# Patient Record
Sex: Female | Born: 1945 | Race: White | Hispanic: No | Marital: Married | Smoking: Never smoker
Health system: Southern US, Community
[De-identification: ages and names within clinical notes are randomized; demographics above are authoritative.]

## PROBLEM LIST (undated history)

## (undated) DIAGNOSIS — M810 Age-related osteoporosis without current pathological fracture: Secondary | ICD-10-CM

## (undated) DIAGNOSIS — K219 Gastro-esophageal reflux disease without esophagitis: Secondary | ICD-10-CM

## (undated) DIAGNOSIS — Z933 Colostomy status: Secondary | ICD-10-CM

## (undated) DIAGNOSIS — K635 Polyp of colon: Secondary | ICD-10-CM

## (undated) DIAGNOSIS — K5790 Diverticulosis of intestine, part unspecified, without perforation or abscess without bleeding: Secondary | ICD-10-CM

## (undated) DIAGNOSIS — J4520 Mild intermittent asthma, uncomplicated: Secondary | ICD-10-CM

## (undated) DIAGNOSIS — Z8489 Family history of other specified conditions: Secondary | ICD-10-CM

## (undated) DIAGNOSIS — T8859XA Other complications of anesthesia, initial encounter: Secondary | ICD-10-CM

## (undated) DIAGNOSIS — C8581 Other specified types of non-Hodgkin lymphoma, lymph nodes of head, face, and neck: Secondary | ICD-10-CM

## (undated) DIAGNOSIS — E871 Hypo-osmolality and hyponatremia: Secondary | ICD-10-CM

## (undated) DIAGNOSIS — R000 Tachycardia, unspecified: Secondary | ICD-10-CM

## (undated) DIAGNOSIS — M1990 Unspecified osteoarthritis, unspecified site: Secondary | ICD-10-CM

## (undated) DIAGNOSIS — C439 Malignant melanoma of skin, unspecified: Secondary | ICD-10-CM

## (undated) DIAGNOSIS — D649 Anemia, unspecified: Secondary | ICD-10-CM

## (undated) HISTORY — PX: TONSILLECTOMY: SUR1361

## (undated) HISTORY — DX: Diverticulosis of intestine, part unspecified, without perforation or abscess without bleeding: K57.90

## (undated) HISTORY — PX: CATARACT EXTRACTION: SUR2

## (undated) HISTORY — DX: Polyp of colon: K63.5

## (undated) HISTORY — DX: Age-related osteoporosis without current pathological fracture: M81.0

## (undated) HISTORY — DX: Mild intermittent asthma, uncomplicated: J45.20

## (undated) HISTORY — PX: BACK SURGERY: SHX140

---

## 1986-06-02 DIAGNOSIS — C439 Malignant melanoma of skin, unspecified: Secondary | ICD-10-CM

## 1986-06-02 HISTORY — DX: Malignant melanoma of skin, unspecified: C43.9

## 1988-06-02 HISTORY — PX: VAGINAL HYSTERECTOMY: SUR661

## 2015-03-19 ENCOUNTER — Other Ambulatory Visit: Payer: Self-pay | Admitting: Internal Medicine

## 2015-03-19 DIAGNOSIS — Z1231 Encounter for screening mammogram for malignant neoplasm of breast: Secondary | ICD-10-CM

## 2015-03-19 DIAGNOSIS — M810 Age-related osteoporosis without current pathological fracture: Secondary | ICD-10-CM | POA: Insufficient documentation

## 2015-03-19 DIAGNOSIS — J4520 Mild intermittent asthma, uncomplicated: Secondary | ICD-10-CM | POA: Insufficient documentation

## 2015-03-28 ENCOUNTER — Ambulatory Visit: Payer: Self-pay

## 2015-04-16 ENCOUNTER — Other Ambulatory Visit: Payer: Self-pay | Admitting: Internal Medicine

## 2015-04-16 ENCOUNTER — Ambulatory Visit
Admission: RE | Admit: 2015-04-16 | Discharge: 2015-04-16 | Disposition: A | Discharge status: Home / Self Care | Payer: Medicare Other | Source: Ambulatory Visit | Attending: Internal Medicine | Admitting: Internal Medicine

## 2015-04-16 DIAGNOSIS — Z1231 Encounter for screening mammogram for malignant neoplasm of breast: Secondary | ICD-10-CM | POA: Diagnosis present

## 2015-04-16 DIAGNOSIS — C439 Malignant melanoma of skin, unspecified: Secondary | ICD-10-CM | POA: Insufficient documentation

## 2015-04-16 HISTORY — DX: Malignant melanoma of skin, unspecified: C43.9

## 2016-12-05 ENCOUNTER — Other Ambulatory Visit: Payer: Self-pay | Admitting: Internal Medicine

## 2016-12-05 DIAGNOSIS — Z1231 Encounter for screening mammogram for malignant neoplasm of breast: Secondary | ICD-10-CM

## 2016-12-17 ENCOUNTER — Ambulatory Visit
Admission: RE | Admit: 2016-12-17 | Discharge: 2016-12-17 | Disposition: A | Discharge status: Home / Self Care | Payer: Medicare Other | Source: Ambulatory Visit | Attending: Internal Medicine | Admitting: Internal Medicine

## 2016-12-17 DIAGNOSIS — Z1231 Encounter for screening mammogram for malignant neoplasm of breast: Secondary | ICD-10-CM | POA: Diagnosis present

## 2017-12-08 ENCOUNTER — Other Ambulatory Visit: Payer: Self-pay | Admitting: Internal Medicine

## 2017-12-08 DIAGNOSIS — Z1231 Encounter for screening mammogram for malignant neoplasm of breast: Secondary | ICD-10-CM

## 2018-01-01 ENCOUNTER — Ambulatory Visit
Admission: RE | Admit: 2018-01-01 | Discharge: 2018-01-01 | Disposition: A | Discharge status: Home / Self Care | Payer: Medicare Other | Source: Ambulatory Visit | Attending: Internal Medicine | Admitting: Internal Medicine

## 2018-01-01 DIAGNOSIS — Z1231 Encounter for screening mammogram for malignant neoplasm of breast: Secondary | ICD-10-CM | POA: Insufficient documentation

## 2018-12-20 ENCOUNTER — Other Ambulatory Visit: Payer: Self-pay | Admitting: Internal Medicine

## 2018-12-20 DIAGNOSIS — Z1231 Encounter for screening mammogram for malignant neoplasm of breast: Secondary | ICD-10-CM

## 2019-01-21 ENCOUNTER — Other Ambulatory Visit: Payer: Self-pay

## 2019-01-21 ENCOUNTER — Ambulatory Visit
Admission: RE | Admit: 2019-01-21 | Discharge: 2019-01-21 | Disposition: A | Discharge status: Home / Self Care | Payer: Medicare Other | Source: Ambulatory Visit | Attending: Internal Medicine | Admitting: Internal Medicine

## 2019-01-21 DIAGNOSIS — Z1231 Encounter for screening mammogram for malignant neoplasm of breast: Secondary | ICD-10-CM | POA: Diagnosis present

## 2019-12-21 ENCOUNTER — Other Ambulatory Visit: Payer: Self-pay | Admitting: Internal Medicine

## 2019-12-21 DIAGNOSIS — Z1231 Encounter for screening mammogram for malignant neoplasm of breast: Secondary | ICD-10-CM

## 2020-02-07 ENCOUNTER — Ambulatory Visit
Admission: RE | Admit: 2020-02-07 | Discharge: 2020-02-07 | Disposition: A | Discharge status: Home / Self Care | Payer: Medicare Other | Source: Ambulatory Visit | Attending: Internal Medicine | Admitting: Internal Medicine

## 2020-02-07 DIAGNOSIS — Z1231 Encounter for screening mammogram for malignant neoplasm of breast: Secondary | ICD-10-CM

## 2020-06-02 DIAGNOSIS — J189 Pneumonia, unspecified organism: Secondary | ICD-10-CM

## 2020-06-02 HISTORY — DX: Pneumonia, unspecified organism: J18.9

## 2020-06-10 ENCOUNTER — Other Ambulatory Visit: Payer: Self-pay

## 2020-06-10 DIAGNOSIS — Z20822 Contact with and (suspected) exposure to covid-19: Secondary | ICD-10-CM

## 2020-06-14 LAB — NOVEL CORONAVIRUS, NAA: SARS-CoV-2, NAA: DETECTED — AB

## 2020-08-25 ENCOUNTER — Encounter: Payer: Self-pay | Admitting: Emergency Medicine

## 2020-08-25 ENCOUNTER — Other Ambulatory Visit: Payer: Self-pay

## 2020-08-25 ENCOUNTER — Emergency Department: Payer: Medicare Other

## 2020-08-25 ENCOUNTER — Emergency Department
Admission: EM | Admit: 2020-08-25 | Discharge: 2020-08-25 | Disposition: A | Discharge status: Home / Self Care | Payer: Medicare Other | Attending: Emergency Medicine | Admitting: Emergency Medicine

## 2020-08-25 DIAGNOSIS — M5432 Sciatica, left side: Secondary | ICD-10-CM | POA: Diagnosis not present

## 2020-08-25 DIAGNOSIS — M549 Dorsalgia, unspecified: Secondary | ICD-10-CM | POA: Diagnosis present

## 2020-08-25 DIAGNOSIS — Z85820 Personal history of malignant melanoma of skin: Secondary | ICD-10-CM | POA: Diagnosis not present

## 2020-08-25 MED ORDER — METHYLPREDNISOLONE 4 MG PO TBPK: ORAL_TABLET | ORAL | 0 refills | Status: DC

## 2020-08-25 MED ORDER — DEXAMETHASONE SODIUM PHOSPHATE 10 MG/ML IJ SOLN
10.0000 mg | Freq: Once | INTRAMUSCULAR | Status: AC
  Administered 2020-08-25: 10 mg via INTRAMUSCULAR
  Filled 2020-08-25: qty 1

## 2020-08-25 MED ORDER — TRAMADOL HCL 50 MG PO TABS: 50.0000 mg | ORAL_TABLET | Freq: Two times a day (BID) | ORAL | 0 refills | Status: DC | PRN

## 2020-08-25 MED ORDER — HYDROMORPHONE HCL 1 MG/ML IJ SOLN
0.5000 mg | Freq: Once | INTRAMUSCULAR | Status: AC
  Administered 2020-08-25: 0.5 mg via INTRAMUSCULAR
  Filled 2020-08-25: qty 1

## 2020-08-25 NOTE — ED Triage Notes (Signed)
C/O left lower back pain radiating down left leg x 1 week.  Seen by PCP and diagnosed with pinched nerve.  Arrives today with persistent pain.  AAOx3.  Skin warm and dry. Ambulates with easy and steady gait. NAD

## 2020-08-25 NOTE — ED Notes (Signed)
Pt complains of left lower back pain that radiates to anterior thigh that's been going on for 5 days saw her PCP ordered meloxicam and gabapentin pt states no relief in pain

## 2020-08-25 NOTE — ED Provider Notes (Signed)
[facility] Emergency Department Provider Note   ____________________________________________   Event Date/Time   First MD Initiated Contact with Patient 08/25/20 [phone]     (approximate)  I have reviewed the triage vital signs and the nursing notes.   HISTORY  Chief Complaint Back Pain    HPI Mary Washington is a 75 y.o. female patient complain of radicular back pain to the left lower extremity for 1 week.  Patient states saw PCP was diagnosed with pinched nerve.  Patient she was given prescription for meloxicam and gabapentin.  Patient no improvement with pain.  Patient was told that if medication did not work she will follow-up with orthopedics.  Patient elected to come to emergency room.  Patient is to contact her PCP to tell her the medication is not working.         Past Medical History:  Diagnosis Date  . Melanoma ([place]) 1988   lt upper arm, some lymph nodes removed    Patient Active Problem List   Diagnosis Date Noted  . Melanoma [facility])     Past Surgical History:  Procedure Laterality Date  . BACK SURGERY    . CATARACT EXTRACTION    . TONSILLECTOMY    . VAGINAL HYSTERECTOMY  1990    Prior to Admission medications   Medication Sig Start Date End Date Taking? Authorizing Provider  methylPREDNISolone (MEDROL DOSEPAK) 4 MG TBPK tablet Take Tapered dose as directed 08/25/20   Sable Feil, PA-C  traMADol (ULTRAM) 50 MG tablet Take 1 tablet (50 mg total) by mouth every 12 (twelve) hours as needed for moderate pain. 08/25/20   Sable Feil, PA-C    Allergies Patient has no known allergies.  Family History  Problem Relation Age of Onset  . Breast cancer Neg Hx     Social History Social History   Tobacco Use  . Smoking status: Never Smoker  . Smokeless tobacco: Never Used    Review of Systems Constitutional: No fever/chills Eyes: No visual changes. ENT: No sore throat. Cardiovascular: Denies chest pain. Respiratory: Denies  shortness of breath. Gastrointestinal: No abdominal pain.  No nausea, no vomiting.  No diarrhea.  No constipation. Genitourinary: Negative for dysuria. Musculoskeletal: Radicular back pain. Skin: Negative for rash. Neurological: Negative for headaches, focal weakness or numbness.   ____________________________________________   PHYSICAL EXAM:  VITAL SIGNS: ED Triage Vitals  Enc Vitals Group     BP 08/25/20 0900 (!) 167/81     Pulse Rate 08/25/20 0900 (!) 113     Resp 08/25/20 0900 18     Temp 08/25/20 0900 98.4 F (36.9 C)     Temp Source 08/25/20 0900 Oral     SpO2 08/25/20 0900 99 %     Weight 08/25/20 0858 138 lb (62.6 kg)     Height 08/25/20 0858 5\' 2"  (1.575 m)     Head Circumference --      Peak Flow --      Pain Score 08/25/20 0858 5     Pain Loc --      Pain Edu? --      Excl. in [place]? --    Constitutional: Alert and oriented. Well appearing and in no acute distress. Hematological/Lymphatic/Immunilogical: No cervical lymphadenopathy. Cardiovascular: Normal rate, regular rhythm. Grossly normal heart sounds.  Good peripheral circulation. Respiratory: Normal respiratory effort.  No retractions. Lungs CTAB. Gastrointestinal: Soft and nontender. No distention. No abdominal bruits. No CVA tenderness. Musculoskeletal: No lower spinal deformity.  Surgical scar  consistent with past history.  Moderate guarding palpation L4-S1.  No lower extremity tenderness nor edema.  No joint effusions.  Negative straight leg test bilaterally. Neurologic:  Normal speech and language. No gross focal neurologic deficits are appreciated. No gait instability. Skin:  Skin is warm, dry and intact. No rash noted. Psychiatric: Mood and affect are normal. Speech and behavior are normal.  ____________________________________________   LABS (all labs ordered are listed, but only abnormal results are displayed)  Labs Reviewed - No data to  display ____________________________________________  EKG   ____________________________________________  RADIOLOGY I, Sable Feil, personally viewed and evaluated these images (plain radiographs) as part of my medical decision making, as well as reviewing the written report by the radiologist.  ED MD interpretation: Mild degenerative changes  Official radiology report(s): DG Lumbar Spine 2-3 Views  Result Date: 08/25/2020 CLINICAL DATA:  Low back pain EXAM: LUMBAR SPINE - 2-3 VIEW COMPARISON:  None. FINDINGS: Right vestigial rib at presumed T12 vertebral body, with vertebral augmentation at this level. Five lumbar-type vertebral bodies.  Normal lumbar lordosis. No evidence of fracture or dislocation. Vertebral body heights are maintained. Mild degenerative changes at L4-5. Visualized bony pelvis appears intact. IMPRESSION: No fracture or dislocation is seen. Mild degenerative changes at L4-5. Electronically Signed   By: Julian Hy M.D.   On: 08/25/2020 10:26    ____________________________________________   PROCEDURES  Procedure(s) performed (including Critical Care):  Procedures   ____________________________________________   INITIAL IMPRESSION / ASSESSMENT AND PLAN / ED COURSE  As part of my medical decision making, I reviewed the following data within the [facility]         Patient presents with 1 week of radicular back pain to left lower extremity.  Differentials consist of pinched nerve versus sciatica.  Discussed no acute findings on x-ray.  Patient complaint physical exam consistent with with radicular pain.  Patient advised to discontinue meloxicam.  Patient given Decadron and tramadol prior to departure.  Patient given prescription for Medrol Dosepak and advised to take tramadol every 12 hours as needed.  Advised to follow-up orthopedic listed discharge care instructions.  Return back condition worsens before evaluation by orthopedics.      ____________________________________________   FINAL CLINICAL IMPRESSION(S) / ED DIAGNOSES  Final diagnoses:  Sciatica of left side     ED Discharge Orders         Ordered    methylPREDNISolone (MEDROL DOSEPAK) 4 MG TBPK tablet  Status:  Discontinued        08/25/20 1049    traMADol (ULTRAM) 50 MG tablet  Every 12 hours PRN,   Status:  Discontinued        08/25/20 1049    methylPREDNISolone (MEDROL DOSEPAK) 4 MG TBPK tablet        08/25/20 1053    traMADol (ULTRAM) 50 MG tablet  Every 12 hours PRN        08/25/20 1053          *Please note:  Mary Washington was evaluated in Emergency Department on 08/25/2020 for the symptoms described in the history of present illness. She was evaluated in the context of the global COVID-19 pandemic, which necessitated consideration that the patient might be at risk for infection with the SARS-CoV-2 virus that causes COVID-19. Institutional protocols and algorithms that pertain to the evaluation of patients at risk for COVID-19 are in a state of rapid change based on information released by regulatory bodies including the CDC and federal and  state organizations. These policies and algorithms were followed during the patient's care in the ED.  Some ED evaluations and interventions may be delayed as a result of limited staffing during and the pandemic.*   Note:  This document was prepared using Dragon voice recognition software and may include unintentional dictation errors.    Sable Feil, PA-C 08/25/20 1100    Vladimir Crofts, MD 08/25/20 1415

## 2020-08-25 NOTE — Discharge Instructions (Addendum)
Read and follow discharge care instructions.  Discontinue meloxicam but continue gabapentin.  Start Medrol Dosepak and tramadol as directed.

## 2020-09-27 ENCOUNTER — Encounter: Payer: Self-pay | Admitting: Internal Medicine

## 2020-09-27 ENCOUNTER — Inpatient Hospital Stay: Payer: Medicare Other

## 2020-09-27 ENCOUNTER — Inpatient Hospital Stay: Payer: Medicare Other | Attending: Internal Medicine | Admitting: Internal Medicine

## 2020-09-27 ENCOUNTER — Other Ambulatory Visit: Payer: Self-pay

## 2020-09-27 VITALS — BP 151/89 | HR 120 | Temp 97.9°F | Ht 62.0 in | Wt 129.0 lb

## 2020-09-27 DIAGNOSIS — R590 Localized enlarged lymph nodes: Secondary | ICD-10-CM | POA: Insufficient documentation

## 2020-09-27 DIAGNOSIS — G8929 Other chronic pain: Secondary | ICD-10-CM | POA: Diagnosis not present

## 2020-09-27 DIAGNOSIS — M25559 Pain in unspecified hip: Secondary | ICD-10-CM | POA: Diagnosis not present

## 2020-09-27 DIAGNOSIS — Z85820 Personal history of malignant melanoma of skin: Secondary | ICD-10-CM | POA: Insufficient documentation

## 2020-09-27 DIAGNOSIS — K5909 Other constipation: Secondary | ICD-10-CM | POA: Insufficient documentation

## 2020-09-27 DIAGNOSIS — Z79899 Other long term (current) drug therapy: Secondary | ICD-10-CM | POA: Insufficient documentation

## 2020-09-27 DIAGNOSIS — M545 Low back pain, unspecified: Secondary | ICD-10-CM | POA: Diagnosis not present

## 2020-09-27 LAB — CBC WITH DIFFERENTIAL/PLATELET
Abs Immature Granulocytes: 0.03 10*3/uL (ref 0.00–0.07)
Basophils Absolute: 0 10*3/uL (ref 0.0–0.1)
Basophils Relative: 0 %
Eosinophils Absolute: 0.1 10*3/uL (ref 0.0–0.5)
Eosinophils Relative: 2 %
HCT: 43.9 % (ref 36.0–46.0)
Hemoglobin: 14.3 g/dL (ref 12.0–15.0)
Immature Granulocytes: 0 %
Lymphocytes Relative: 13 %
Lymphs Abs: 1.2 10*3/uL (ref 0.7–4.0)
MCH: 26.6 pg (ref 26.0–34.0)
MCHC: 32.6 g/dL (ref 30.0–36.0)
MCV: 81.8 fL (ref 80.0–100.0)
Monocytes Absolute: 1 10*3/uL (ref 0.1–1.0)
Monocytes Relative: 11 %
Neutro Abs: 6.7 10*3/uL (ref 1.7–7.7)
Neutrophils Relative %: 74 %
Platelets: 539 10*3/uL — ABNORMAL HIGH (ref 150–400)
RBC: 5.37 MIL/uL — ABNORMAL HIGH (ref 3.87–5.11)
RDW: 14.8 % (ref 11.5–15.5)
WBC: 9 10*3/uL (ref 4.0–10.5)
nRBC: 0 % (ref 0.0–0.2)

## 2020-09-27 LAB — COMPREHENSIVE METABOLIC PANEL
ALT: 17 U/L (ref 0–44)
AST: 21 U/L (ref 15–41)
Albumin: 3.8 g/dL (ref 3.5–5.0)
Alkaline Phosphatase: 98 U/L (ref 38–126)
Anion gap: 13 (ref 5–15)
BUN: 14 mg/dL (ref 8–23)
CO2: 28 mmol/L (ref 22–32)
Calcium: 9.9 mg/dL (ref 8.9–10.3)
Chloride: 90 mmol/L — ABNORMAL LOW (ref 98–111)
Creatinine, Ser: 0.84 mg/dL (ref 0.44–1.00)
GFR, Estimated: >60 mL/min (ref >60)
Glucose, Bld: 112 mg/dL — ABNORMAL HIGH (ref 70–99)
Potassium: 3.8 mmol/L (ref 3.5–5.1)
Sodium: 131 mmol/L — ABNORMAL LOW (ref 135–145)
Total Bilirubin: 0.6 mg/dL (ref 0.3–1.2)
Total Protein: 7.3 g/dL (ref 6.5–8.1)

## 2020-09-27 LAB — LACTATE DEHYDROGENASE: LDH: 169 U/L (ref 98–192)

## 2020-09-27 NOTE — Progress Notes (Signed)
[facility] NOTE  Patient Care Team: [name], MD as PCP - General (Internal Medicine)  CHIEF COMPLAINTS/PURPOSE OF CONSULTATION: abdominal LN  # coloscopy [2022; [facility]]; mammogram- nov 2021-WNL.   # Melanoma [left Upper arm] s/p excision [skin graft- 1988] Oncology History   No history exists.     HISTORY OF PRESENTING ILLNESS:  Mary Washington 75 y.o.  female has been referred to [place] for further evaluation recommendations for incidentally found abdominal adenopathy on MRI.  Patient complains of left foot pain/ankle pain for which she underwent lumbosacral spine MRI-the MRI showed significant abdominal pelvic adenopathy suspicious for nodal malignancy/metastatic disease.  She has been referred to oncology for further evaluation and recommendations.  Patient has had weight loss.  Chronic constipation.  Denies any blood in stools or black or stools.  Otherwise no nausea no vomiting.   Review of Systems  Constitutional: Positive for malaise/fatigue and weight loss. Negative for chills, diaphoresis and fever.  HENT: Negative for nosebleeds and sore throat.   Eyes: Negative for double vision.  Respiratory: Negative for cough, hemoptysis, sputum production, shortness of breath and wheezing.   Cardiovascular: Negative for chest pain, palpitations, orthopnea and leg swelling.  Gastrointestinal: Positive for constipation. Negative for abdominal pain, blood in stool, diarrhea, heartburn, melena, nausea and vomiting.  Genitourinary: Negative for dysuria, frequency and urgency.  Musculoskeletal: Positive for back pain and joint pain.  Skin: Negative.  Negative for itching and rash.  Neurological: Negative for dizziness, tingling, focal weakness, weakness and headaches.  Endo/Heme/Allergies: Does not bruise/bleed easily.  Psychiatric/Behavioral: Negative for depression. The patient is not nervous/anxious and does not have insomnia.      MEDICAL HISTORY:  Past  Medical History:  Diagnosis Date  . Colon polyps   . Diverticulosis   . Melanoma ([place]) 1988   lt upper arm, some lymph nodes removed  . Mild intermittent asthma   . Osteoporosis     SURGICAL HISTORY: Past Surgical History:  Procedure Laterality Date  . BACK SURGERY    . CATARACT EXTRACTION    . TONSILLECTOMY    . VAGINAL HYSTERECTOMY  1990    SOCIAL HISTORY: Social History   Socioeconomic History  . Marital status: Married    Spouse name: Not on file  . Number of children: Not on file  . Years of education: Not on file  . Highest education level: Not on file  Occupational History  . Not on file  Tobacco Use  . Smoking status: Never Smoker  . Smokeless tobacco: Never Used  Substance and Sexual Activity  . Alcohol use: Never  . Drug use: Never  . Sexual activity: Yes    Partners: Male  Other Topics Concern  . Not on file  Social History Narrative  . Not on file   Social Determinants of Health   Financial Resource Strain: Not on file  Food Insecurity: Not on file  Transportation Needs: Not on file  Physical Activity: Not on file  Stress: Not on file  Social Connections: Not on file  Intimate Partner Violence: Not on file    FAMILY HISTORY: Family History  Problem Relation Age of Onset  . Breast cancer Neg Hx     ALLERGIES:  has No Known Allergies.  MEDICATIONS:  Current Outpatient Medications  Medication Sig Dispense Refill  . acetaminophen (TYLENOL) 500 MG tablet Take 500 mg by mouth every 6 (six) hours as needed.    Marland Kitchen aspirin 81 MG EC tablet Take 1 tablet  by mouth daily at 12 noon.    . Cholecalciferol (VITAMIN D) 50 MCG (2000 UT) CAPS Take 1 capsule by mouth daily.    . Lactobacillus (PROBIOTIC ACIDOPHILUS PO) Take 1 capsule by mouth daily at 12 noon.    . Magnesium Oxide (MAG-OXIDE PO) Take 1 capsule by mouth daily at 12 noon.    . meloxicam (MOBIC) 15 MG tablet Take 1 tablet by mouth daily.    . methocarbamol (ROBAXIN) 500 MG tablet Take 500 mg  by mouth 4 (four) times daily.    . traMADol (ULTRAM) 50 MG tablet Take 1 tablet (50 mg total) by mouth every 12 (twelve) hours as needed for moderate pain. 12 tablet 0  . loratadine (CLARITIN) 10 MG tablet Take 1 tablet by mouth as needed. (Patient not taking: Reported on 09/27/2020)     No current facility-administered medications for this visit.      Marland Kitchen  PHYSICAL EXAMINATION: ECOG PERFORMANCE STATUS: 1 - Symptomatic but completely ambulatory  Vitals:   09/27/20 1128  BP: (!) 151/89  Pulse: (!) 120  Temp: 97.9 F (36.6 C)  SpO2: 97%   Filed Weights   09/27/20 1128  Weight: 129 lb (58.5 kg)    Physical Exam Constitutional:      Comments: Accompanied by husband.  In a wheelchair because of back pain.  Right axilla adenopathy.  Approximate 2 to 3 cm in size  HENT:     Head: Normocephalic and atraumatic.     Mouth/Throat:     Pharynx: No oropharyngeal exudate.  Eyes:     Pupils: Pupils are equal, round, and reactive to light.  Cardiovascular:     Rate and Rhythm: Normal rate and regular rhythm.  Pulmonary:     Effort: Pulmonary effort is normal. No respiratory distress.     Breath sounds: Normal breath sounds. No wheezing.  Abdominal:     General: Bowel sounds are normal. There is no distension.     Palpations: Abdomen is soft. There is no mass.     Tenderness: There is no abdominal tenderness. There is no guarding or rebound.  Musculoskeletal:        General: No tenderness. Normal range of motion.     Cervical back: Normal range of motion and neck supple.  Skin:    General: Skin is warm.  Neurological:     Mental Status: She is alert and oriented to person, place, and time.  Psychiatric:        Mood and Affect: Affect normal.      LABORATORY DATA:  I have reviewed the data as listed Lab Results  Component Value Date   WBC 9.0 09/27/2020   HGB 14.3 09/27/2020   HCT 43.9 09/27/2020   MCV 81.8 09/27/2020   PLT 539 (H) 09/27/2020   Recent Labs     09/27/20 1238  NA 131*  K 3.8  CL 90*  CO2 28  GLUCOSE 112*  BUN 14  CREATININE 0.84  CALCIUM 9.9  GFRNONAA >60  PROT 7.3  ALBUMIN 3.8  AST 21  ALT 17  ALKPHOS 98  BILITOT 0.6    RADIOGRAPHIC STUDIES: I have personally reviewed the radiological images as listed and agreed with the findings in the report.   ASSESSMENT & PLAN:   Abdominal lymphadenopathy #Incidental adenopathy-bulky noted on abdomen noted on [MRI-back; [facility]].  Clinical exam-positive for right axillary adenopathy.  Recommend further work-up with CT scan chest and pelvis for further evaluation.  #Discussed with patient and husband-patient  will likely need a PET scan for further evaluation; also biopsy to confirm malignancy.  #History of melanoma-clinically does not appear to be recurrent melanoma at this time.  However await above work-up.  #Chronic low back pain/hip pain-likely arthritic/degenerative.  Unrelated to above findings on MRI.  Plan to follow-up with orthopedics.  Thank you Dr.MIller for allowing me to participate in the care of your pleasant patient. Please do not hesitate to contact me with questions or concerns in the interim.  # [id number]:[phone] [home] # Labs today- cbc/cmp;LDH # CT CAP- ASAP # follow up TBD- Dr.B  All questions were answered. The patient knows to call the clinic with any problems, questions or concerns.    [name], MD 10/03/2020 12:34 PM

## 2020-09-27 NOTE — Assessment & Plan Note (Addendum)
#  Incidental adenopathy-bulky noted on abdomen noted on [MRI-back; [facility]].  Clinical exam-positive for right axillary adenopathy.  Recommend further work-up with CT scan chest and pelvis for further evaluation.  #Discussed with patient and husband-patient will likely need a PET scan for further evaluation; also biopsy to confirm malignancy.  #History of melanoma-clinically does not appear to be recurrent melanoma at this time.  However await above work-up.  #Chronic low back pain/hip pain-likely arthritic/degenerative.  Unrelated to above findings on MRI.  Plan to follow-up with orthopedics.  Thank you Dr.MIller for allowing me to participate in the care of your pleasant patient. Please do not hesitate to contact me with questions or concerns in the interim.  # [id number]:[phone] [home] # Labs today- cbc/cmp;LDH # CT CAP- ASAP # follow up TBD- Dr.B

## 2020-09-28 LAB — CEA: CEA: 0.5 ng/mL (ref 0.0–4.7)

## 2020-10-02 ENCOUNTER — Ambulatory Visit
Admission: RE | Admit: 2020-10-02 | Discharge: 2020-10-02 | Disposition: A | Discharge status: Home / Self Care | Payer: Medicare Other | Source: Ambulatory Visit | Attending: Internal Medicine | Admitting: Internal Medicine

## 2020-10-02 DIAGNOSIS — R590 Localized enlarged lymph nodes: Secondary | ICD-10-CM | POA: Insufficient documentation

## 2020-10-02 MED ORDER — IOHEXOL 300 MG/ML  SOLN
100.0000 mL | Freq: Once | INTRAMUSCULAR | Status: AC | PRN
  Administered 2020-10-02: 100 mL via INTRAVENOUS

## 2020-10-03 ENCOUNTER — Telehealth: Payer: Self-pay | Admitting: *Deleted

## 2020-10-03 NOTE — Telephone Encounter (Signed)
Dr. Jacinto Reap- please advise on patient follow-up

## 2020-10-03 NOTE — Telephone Encounter (Signed)
Called report  IMPRESSION: 1. 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 2. Hypodense lesion in the pancreatic head/uncinate process of the pancreas measuring up to 1.3 cm with density greater than that expected for a simple cystic lesion. No pancreatic ductal dilation. While this may represent a cystic pancreatic lesions such as side-branch IPMN or pseudocyst complicated by internal protein/hemorrhage further characterization of the lesion with pancreas protocol MRI with and without contrast is suggested to exclude a malignant primary pancreatic neoplasm. 3. Mild distal esophageal wall thickening with refluxed orally ingested contrast in the distal esophagus. Correlate for esophagitis. 4. Left-sided colonic diverticulosis without findings of acute diverticulitis. 5. Aortic atherosclerosis.  Aortic Atherosclerosis (ICD10-I70.0).  These results will be called to the ordering clinician or representative by the Radiologist Assistant, and communication documented in the PACS or [employer].   Electronically Signed   By: Dahlia Bailiff MD   On: 10/03/2020 10:59

## 2020-10-04 ENCOUNTER — Telehealth: Payer: Self-pay | Admitting: *Deleted

## 2020-10-04 ENCOUNTER — Telehealth: Payer: Self-pay | Admitting: Internal Medicine

## 2020-10-04 NOTE — Telephone Encounter (Signed)
See Dr. Sharmaine Base phone note on scheduling the patient a follow-up tomorrow.

## 2020-10-04 NOTE — Telephone Encounter (Signed)
Spoke with patient. Pt agreeable to come to apt tomorrow at 10 am. Apts made.

## 2020-10-04 NOTE — Telephone Encounter (Signed)
Mr Koble called stating they see the results of CT but do not understand the meaning, He is asking he be called and is also asking what the next steps are to be. Patient does not have FOLLOW UP appointment as follow up TBD per last visit disposition note  IMPRESSION: 1. 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 2. Hypodense lesion in the pancreatic head/uncinate process of the pancreas measuring up to 1.3 cm with density greater than that expected for a simple cystic lesion. No pancreatic ductal dilation. While this may represent a cystic pancreatic lesions such as side-branch IPMN or pseudocyst complicated by internal protein/hemorrhage further characterization of the lesion with pancreas protocol MRI with and without contrast is suggested to exclude a malignant primary pancreatic neoplasm. 3. Mild distal esophageal wall thickening with refluxed orally ingested contrast in the distal esophagus. Correlate for esophagitis. 4. Left-sided colonic diverticulosis without findings of acute diverticulitis. 5. Aortic atherosclerosis.  Aortic Atherosclerosis (ICD10-I70.0).  These results will be called to the ordering clinician or representative by the Radiologist Assistant, and communication documented in the PACS or [employer].   Electronically Signed   By: Dahlia Bailiff MD   On: 10/03/2020 10:59

## 2020-10-04 NOTE — Telephone Encounter (Signed)
Per Dr. B scheduled patient tomorrow on 10/05/20 at 10 am.   C- please schedule.

## 2020-10-04 NOTE — Telephone Encounter (Signed)
Patient needs follow-up to discuss the results

## 2020-10-04 NOTE — Telephone Encounter (Signed)
On 5/05-spoke to patient/husband regarding the results of the CT scan concerning for lymphoma; would recommend a PET scan.  Would also need to biopsy.   Also discussed regarding pancreas cyst needing MRI.  We will review further at viist tomorrow on 5/06.  GB

## 2020-10-05 ENCOUNTER — Other Ambulatory Visit: Payer: Self-pay

## 2020-10-05 ENCOUNTER — Inpatient Hospital Stay: Payer: Medicare Other | Attending: Internal Medicine | Admitting: Internal Medicine

## 2020-10-05 ENCOUNTER — Encounter: Payer: Self-pay | Admitting: Internal Medicine

## 2020-10-05 DIAGNOSIS — M25559 Pain in unspecified hip: Secondary | ICD-10-CM | POA: Diagnosis not present

## 2020-10-05 DIAGNOSIS — Z85820 Personal history of malignant melanoma of skin: Secondary | ICD-10-CM | POA: Diagnosis not present

## 2020-10-05 DIAGNOSIS — R590 Localized enlarged lymph nodes: Secondary | ICD-10-CM | POA: Insufficient documentation

## 2020-10-05 DIAGNOSIS — G8929 Other chronic pain: Secondary | ICD-10-CM | POA: Insufficient documentation

## 2020-10-05 DIAGNOSIS — K862 Cyst of pancreas: Secondary | ICD-10-CM | POA: Insufficient documentation

## 2020-10-05 DIAGNOSIS — M549 Dorsalgia, unspecified: Secondary | ICD-10-CM | POA: Diagnosis not present

## 2020-10-05 NOTE — Assessment & Plan Note (Addendum)
#  Incidental adenopathy-bulky noted on abdomen noted on [MRI-back; [facility]]. MAY 2022-CT scan chest abdomen pelvis consistent with bulky retroperitoneal adenopathy [6-8 cm]; subpectoral/axillary lymphadenopathy 2 to 2.5 cm; also incidental pancreatic cyst-see below  #Recommend PET scan for further evaluation; and based on PET scan recommend excisional biopsy if feasible Vs. IR guided core biopsy.  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  Recommend MRI for further evaluation; after the PET scan is done.  #History of melanoma-clinically does not appear to be recurrent melanoma at this time.  However await above work-up.  #Chronic low back pain/hip pain-likely arthritic/degenerative.  Unrelated to above findings on MRI.  Discussed with Dr.Miller-recommends further evaluation with neurosurgery.  # [id number]:[phone] [home] # PET scan ASAP  # follow up TBD- Dr.B  # I reviewed the blood work- with the patient in detail; also reviewed the imaging independently [as summarized above]; and with the patient in detail.

## 2020-10-08 NOTE — Progress Notes (Signed)
[facility] NOTE  Patient Care Team: [name], MD as PCP - General (Internal Medicine)  CHIEF COMPLAINTS/PURPOSE OF CONSULTATION: abdominal LN  # Incidental- MAY 2022-CT scan chest abdomen pelvis consistent with bulky retroperitoneal adenopathy [6-8 cm]; subpectoral/axillary lymphadenopathy 2 to 2.5 cm; also incidental pancreatic cyst- PET Pending  # coloscopy [2022; [place]; mammogram- nov 2021-WNL.   # Melanoma [left Upper arm] s/p excision [skin graft- 1988] Oncology History   No history exists.     HISTORY OF PRESENTING ILLNESS:  Mary Washington 75 y.o.  female with acute on chronic low back pain-incidental lymphadenopathy noted on MRI-is here today for the results of her CT scan chest and pelvis.  Patient continues to complain of back pain which seems to her biggest complaint.  Otherwise no nausea no vomiting.  Complains of weight loss.  Chronic constipation.  Review of Systems  Constitutional: Positive for malaise/fatigue and weight loss. Negative for chills, diaphoresis and fever.  HENT: Negative for nosebleeds and sore throat.   Eyes: Negative for double vision.  Respiratory: Negative for cough, hemoptysis, sputum production, shortness of breath and wheezing.   Cardiovascular: Negative for chest pain, palpitations, orthopnea and leg swelling.  Gastrointestinal: Positive for constipation. Negative for abdominal pain, blood in stool, diarrhea, heartburn, melena, nausea and vomiting.  Genitourinary: Negative for dysuria, frequency and urgency.  Musculoskeletal: Positive for back pain and joint pain.  Skin: Negative.  Negative for itching and rash.  Neurological: Negative for dizziness, tingling, focal weakness, weakness and headaches.  Endo/Heme/Allergies: Does not bruise/bleed easily.  Psychiatric/Behavioral: Negative for depression. The patient is not nervous/anxious and does not have insomnia.      MEDICAL HISTORY:  Past Medical History:   Diagnosis Date  . Colon polyps   . Diverticulosis   . Melanoma ([place]) 1988   lt upper arm, some lymph nodes removed  . Mild intermittent asthma   . Osteoporosis     SURGICAL HISTORY: Past Surgical History:  Procedure Laterality Date  . BACK SURGERY    . CATARACT EXTRACTION    . TONSILLECTOMY    . VAGINAL HYSTERECTOMY  1990    SOCIAL HISTORY: Social History   Socioeconomic History  . Marital status: Married    Spouse name: Not on file  . Number of children: Not on file  . Years of education: Not on file  . Highest education level: Not on file  Occupational History  . Not on file  Tobacco Use  . Smoking status: Never Smoker  . Smokeless tobacco: Never Used  Substance and Sexual Activity  . Alcohol use: Never  . Drug use: Never  . Sexual activity: Yes    Partners: Male  Other Topics Concern  . Not on file  Social History Narrative  . Not on file   Social Determinants of Health   Financial Resource Strain: Not on file  Food Insecurity: Not on file  Transportation Needs: Not on file  Physical Activity: Not on file  Stress: Not on file  Social Connections: Not on file  Intimate Partner Violence: Not on file    FAMILY HISTORY: Family History  Problem Relation Age of Onset  . Breast cancer Neg Hx     ALLERGIES:  has No Known Allergies.  MEDICATIONS:  Current Outpatient Medications  Medication Sig Dispense Refill  . acetaminophen (TYLENOL) 500 MG tablet Take 500 mg by mouth every 6 (six) hours as needed.    Marland Kitchen aspirin 81 MG EC tablet Take 1 tablet by  mouth daily at 12 noon.    . Cholecalciferol (VITAMIN D) 50 MCG (2000 UT) CAPS Take 1 capsule by mouth daily.    . Lactobacillus (PROBIOTIC ACIDOPHILUS PO) Take 1 capsule by mouth daily at 12 noon.    . Magnesium Oxide (MAG-OXIDE PO) Take 1 capsule by mouth daily at 12 noon.    . meloxicam (MOBIC) 15 MG tablet Take 1 tablet by mouth daily.    . methocarbamol (ROBAXIN) 500 MG tablet Take 500 mg by mouth in the  morning and at bedtime.    . traMADol (ULTRAM) 50 MG tablet Take 1 tablet (50 mg total) by mouth every 12 (twelve) hours as needed for moderate pain. (Patient taking differently: Take 50 mg by mouth 6 (six) times daily.) 12 tablet 0  . loratadine (CLARITIN) 10 MG tablet Take 1 tablet by mouth as needed. (Patient not taking: No sig reported)     No current facility-administered medications for this visit.      Marland Kitchen  PHYSICAL EXAMINATION: ECOG PERFORMANCE STATUS: 1 - Symptomatic but completely ambulatory  Vitals:   10/05/20 0955  BP: (!) 148/82  Pulse: (!) 107  Resp: 16  Temp: 99.3 F (37.4 C)  SpO2: 98%   Filed Weights   10/05/20 0955  Weight: 129 lb (58.5 kg)    Physical Exam Constitutional:      Comments: Accompanied by husband.  In a wheelchair because of back pain.  Right axilla adenopathy.  Approximate 2 to 3 cm in size  HENT:     Head: Normocephalic and atraumatic.     Mouth/Throat:     Pharynx: No oropharyngeal exudate.  Eyes:     Pupils: Pupils are equal, round, and reactive to light.  Cardiovascular:     Rate and Rhythm: Normal rate and regular rhythm.  Pulmonary:     Effort: Pulmonary effort is normal. No respiratory distress.     Breath sounds: Normal breath sounds. No wheezing.  Abdominal:     General: Bowel sounds are normal. There is no distension.     Palpations: Abdomen is soft. There is no mass.     Tenderness: There is no abdominal tenderness. There is no guarding or rebound.  Musculoskeletal:        General: No tenderness. Normal range of motion.     Cervical back: Normal range of motion and neck supple.  Skin:    General: Skin is warm.  Neurological:     Mental Status: She is alert and oriented to person, place, and time.  Psychiatric:        Mood and Affect: Affect normal.      LABORATORY DATA:  I have reviewed the data as listed Lab Results  Component Value Date   WBC 9.0 09/27/2020   HGB 14.3 09/27/2020   HCT 43.9 09/27/2020   MCV  81.8 09/27/2020   PLT 539 (H) 09/27/2020   Recent Labs    09/27/20 1238  NA 131*  K 3.8  CL 90*  CO2 28  GLUCOSE 112*  BUN 14  CREATININE 0.84  CALCIUM 9.9  GFRNONAA >60  PROT 7.3  ALBUMIN 3.8  AST 21  ALT 17  ALKPHOS 98  BILITOT 0.6    RADIOGRAPHIC STUDIES: I have personally reviewed the radiological images as listed and agreed with the findings in the report.   ASSESSMENT & PLAN:   Abdominal lymphadenopathy #Incidental adenopathy-bulky noted on abdomen noted on [MRI-back; [facility]]. MAY 2022-CT scan chest abdomen pelvis consistent with bulky retroperitoneal  adenopathy [6-8 cm]; subpectoral/axillary lymphadenopathy 2 to 2.5 cm; also incidental pancreatic cyst-see below  #Recommend PET scan for further evaluation; and based on PET scan recommend excisional biopsy if feasible Vs. IR guided core biopsy.  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  Recommend MRI for further evaluation; after the PET scan is done.  #History of melanoma-clinically does not appear to be recurrent melanoma at this time.  However await above work-up.  #Chronic low back pain/hip pain-likely arthritic/degenerative.  Unrelated to above findings on MRI.  Discussed with Dr.Miller-recommends further evaluation with neurosurgery.  # [id number]:[phone] [home] # PET scan ASAP  # follow up TBD- Dr.B  # I reviewed the blood work- with the patient in detail; also reviewed the imaging independently [as summarized above]; and with the patient in detail.    All questions were answered. The patient knows to call the clinic with any problems, questions or concerns.    [name], MD 10/08/2020 7:35 AM

## 2020-10-09 ENCOUNTER — Telehealth: Payer: Self-pay | Admitting: Internal Medicine

## 2020-10-09 ENCOUNTER — Encounter: Payer: Self-pay | Admitting: *Deleted

## 2020-10-09 NOTE — Telephone Encounter (Signed)
Patients husband called requesting a call back about the PET and MRI that Dr. Rogue Bussing was requesting.  I see the PET was scheduled today but not an MRI.  Routing to team for follow up.

## 2020-10-09 NOTE — Telephone Encounter (Signed)
I spoke with Dr. Rogue Bussing. He is waiting to order the MRI until PET

## 2020-10-09 NOTE — Telephone Encounter (Signed)
Spoke with patient's husband. Instructions for pet scan provided. Reviewed plan of care with husband. Md will not order mri until pet scan results are available.

## 2020-10-12 ENCOUNTER — Ambulatory Visit: Payer: Medicare Other

## 2020-10-12 ENCOUNTER — Telehealth: Payer: Self-pay | Admitting: Nurse Practitioner

## 2020-10-12 NOTE — Telephone Encounter (Signed)
Peer to Peer conducted on patient's behalf for PET scan. Scan was approved: [id number]

## 2020-10-16 ENCOUNTER — Other Ambulatory Visit: Payer: Self-pay

## 2020-10-16 ENCOUNTER — Ambulatory Visit
Admission: RE | Admit: 2020-10-16 | Discharge: 2020-10-16 | Disposition: A | Discharge status: Home / Self Care | Payer: Medicare Other | Source: Ambulatory Visit | Attending: Internal Medicine | Admitting: Internal Medicine

## 2020-10-16 DIAGNOSIS — R590 Localized enlarged lymph nodes: Secondary | ICD-10-CM | POA: Diagnosis present

## 2020-10-16 DIAGNOSIS — C969 Malignant neoplasm of lymphoid, hematopoietic and related tissue, unspecified: Secondary | ICD-10-CM | POA: Diagnosis not present

## 2020-10-16 LAB — GLUCOSE, CAPILLARY: Glucose-Capillary: 102 mg/dL — ABNORMAL HIGH (ref 70–99)

## 2020-10-16 MED ORDER — FLUDEOXYGLUCOSE F - 18 (FDG) INJECTION
6.7000 | Freq: Once | INTRAVENOUS | Status: AC | PRN
  Administered 2020-10-16: 6.88 via INTRAVENOUS

## 2020-10-21 ENCOUNTER — Other Ambulatory Visit: Payer: Self-pay | Admitting: Internal Medicine

## 2020-10-21 NOTE — Progress Notes (Signed)
MDT on 5/26.

## 2020-10-22 ENCOUNTER — Telehealth: Payer: Self-pay | Admitting: Internal Medicine

## 2020-10-22 DIAGNOSIS — K862 Cyst of pancreas: Secondary | ICD-10-CM

## 2020-10-22 NOTE — Telephone Encounter (Signed)
On 5/23 spoke to pt re: results of PET scan.  Await discussion at tumor conference regarding biopsy planning.  Patient also need MRI abdomen with and without contrast for pancreas cyst.    C- please schedule MRI abdomen.  GB

## 2020-10-24 NOTE — Telephone Encounter (Signed)
Got it scheduled for 6/2 LVM for pt to call me back if any questions she has mychart

## 2020-10-25 ENCOUNTER — Telehealth: Payer: Self-pay | Admitting: Internal Medicine

## 2020-10-25 ENCOUNTER — Other Ambulatory Visit: Payer: Self-pay | Admitting: Internal Medicine

## 2020-10-25 ENCOUNTER — Other Ambulatory Visit: Payer: Medicare Other

## 2020-10-25 DIAGNOSIS — R590 Localized enlarged lymph nodes: Secondary | ICD-10-CM

## 2020-10-25 NOTE — Progress Notes (Signed)
Tumor Board Documentation  Mary Washington was presented by Dr Rogue Bussing at our Tumor Board on 10/25/2020, which included representatives from medical oncology,radiation oncology,internal medicine,navigation,pathology,radiology,surgical,pharmacy,genetics,research,palliative care.  Mary Washington currently presents as a new patient,for discussion with history of the following treatments: active survellience.  Additionally, we reviewed previous medical and familial history, history of present illness, and recent lab results along with all available histopathologic and imaging studies. The tumor board considered available treatment options and made the following recommendations: Biopsy (US Guided Biopsy of Right Axillary Lymph Node)    The following procedures/referrals were also placed: No orders of the defined types were placed in this encounter.   Clinical Trial Status: not discussed   Staging used: To be determined  AJCC Staging:       Group: Abdominal Adenopathy, possible Lymphoma   National site-specific guidelines   were discussed with respect to the case.  Tumor board is a meeting of clinicians from various specialty areas who evaluate and discuss patients for whom a multidisciplinary approach is being considered. Final determinations in the plan of care are those of the provider(s). The responsibility for follow up of recommendations given during tumor board is that of the provider.   Today's extended care, comprehensive team conference, Mary Washington was not present for the discussion and was not examined.   Multidisciplinary Tumor Board is a multidisciplinary case peer review process.  Decisions discussed in the Multidisciplinary Tumor Board reflect the opinions of the specialists present at the conference without having examined the patient.  Ultimately, treatment and diagnostic decisions rest with the primary provider(s) and the patient.

## 2020-10-25 NOTE — Telephone Encounter (Signed)
On 5/26-discussed with tumor conference recommend ultrasound-guided biopsy of the right axilla lymph node.  This has been ordered.  Discussed with patient's husband regarding the plan is in agreement.  C-please schedule ultrasound biopsy ASAP.  Follow-up with MD-1 week post biopsy.  No labs Thanks  GB

## 2020-10-30 ENCOUNTER — Telehealth: Payer: Self-pay | Admitting: *Deleted

## 2020-10-30 NOTE — Telephone Encounter (Signed)
Mary Washington informed that this is being worked on and that they will be informed of appointment as soon as it has been approved and scheduled

## 2020-10-30 NOTE — Telephone Encounter (Signed)
We dont have a time yet Mary Washington is helping [place] with it so ill call him once I know something.

## 2020-10-30 NOTE — Telephone Encounter (Signed)
Mr Orebaugh called stating that patient was to be scheduled for a biopsy and he was expecting a call with appointment, but has not heard form anyone regarding this biopsy. Please return his call regarding this

## 2020-10-30 NOTE — Telephone Encounter (Signed)
Can you fill out the US biopsy sheet and ill fax over I know we did it but they cant find it  Thanks

## 2020-11-01 ENCOUNTER — Ambulatory Visit
Admission: RE | Admit: 2020-11-01 | Discharge: 2020-11-01 | Disposition: A | Discharge status: Home / Self Care | Payer: Medicare Other | Source: Ambulatory Visit | Attending: Internal Medicine | Admitting: Internal Medicine

## 2020-11-01 ENCOUNTER — Other Ambulatory Visit: Payer: Self-pay

## 2020-11-01 DIAGNOSIS — K862 Cyst of pancreas: Secondary | ICD-10-CM | POA: Diagnosis not present

## 2020-11-01 MED ORDER — GADOBUTROL 1 MMOL/ML IV SOLN
5.0000 mL | Freq: Once | INTRAVENOUS | Status: AC | PRN
  Administered 2020-11-01: 5 mL via INTRAVENOUS

## 2020-11-01 NOTE — Progress Notes (Signed)
Patient on schedule for Axillary LN biopsy 11/06/2020, called and spoke with patient on phone with pre procedure instructions given. Made aware to be here @ 1230, NPO after 0630, and driver post procedure/recovery/discharge for sedation. Stated understanding.

## 2020-11-05 ENCOUNTER — Other Ambulatory Visit: Payer: Self-pay | Admitting: Radiology

## 2020-11-05 ENCOUNTER — Telehealth: Payer: Self-pay | Admitting: Internal Medicine

## 2020-11-05 NOTE — Telephone Encounter (Signed)
C- please change pt's appt to June 16th/Thursday; 11:15. Please tell pt/husband since biopsy is on 6/07- it would take at least 1 week for the results.; and we might not have by this Friday 6/10.   Thanks GB

## 2020-11-06 ENCOUNTER — Other Ambulatory Visit: Payer: Self-pay

## 2020-11-06 ENCOUNTER — Ambulatory Visit
Admission: RE | Admit: 2020-11-06 | Discharge: 2020-11-06 | Disposition: A | Discharge status: Home / Self Care | Payer: Medicare Other | Source: Ambulatory Visit | Attending: Internal Medicine | Admitting: Internal Medicine

## 2020-11-06 DIAGNOSIS — R590 Localized enlarged lymph nodes: Secondary | ICD-10-CM

## 2020-11-06 DIAGNOSIS — Z85820 Personal history of malignant melanoma of skin: Secondary | ICD-10-CM | POA: Diagnosis not present

## 2020-11-06 DIAGNOSIS — Z79899 Other long term (current) drug therapy: Secondary | ICD-10-CM | POA: Insufficient documentation

## 2020-11-06 DIAGNOSIS — Z7982 Long term (current) use of aspirin: Secondary | ICD-10-CM | POA: Insufficient documentation

## 2020-11-06 MED ORDER — FENTANYL CITRATE (PF) 100 MCG/2ML IJ SOLN
INTRAMUSCULAR | Status: AC
  Filled 2020-11-06: qty 2

## 2020-11-06 MED ORDER — MIDAZOLAM HCL 2 MG/2ML IJ SOLN
INTRAMUSCULAR | Status: AC | PRN
  Administered 2020-11-06: 1 mg via INTRAVENOUS

## 2020-11-06 MED ORDER — MIDAZOLAM HCL 2 MG/2ML IJ SOLN
INTRAMUSCULAR | Status: AC
  Filled 2020-11-06: qty 2

## 2020-11-06 MED ORDER — FENTANYL CITRATE (PF) 100 MCG/2ML IJ SOLN
INTRAMUSCULAR | Status: AC | PRN
  Administered 2020-11-06: 50 ug via INTRAVENOUS

## 2020-11-06 MED ORDER — SODIUM CHLORIDE 0.9 % IV SOLN: INTRAVENOUS | Status: DC

## 2020-11-06 NOTE — Procedures (Signed)
Pre Procedure Dx: Right axillary lymphadenopathy Post Procedural Dx: Same  Technically successful US guided biopsy of right axillary lymph node.  EBL: None  No immediate complications.   Ronny Bacon, MD Pager #: [phone]

## 2020-11-06 NOTE — Progress Notes (Signed)
Patient clinically stable post Axillary LN biopsy per Dr Pascal Lux, tolerated well. Vitals stable pre and post procedure. Denies complaints at this time. Received Versed 1 mg along with Fentanyl 50 mcg IV for procedure. Report given to Fransico Michael Rn post procedure/specials.

## 2020-11-06 NOTE — Consult Note (Signed)
Chief Complaint: Right axillary lymphadenopathy  Referring Physician(s): Brahmanday,Govinda R  Patient Status: [facility] - Out-pt  History of Present Illness: Mary Washington is a 75 y.o. female with past medical history significant for melanoma (involving the left upper arm with axillary adenectomy) who was found to have retroperitoneal and right axillary lymphadenopathy on CT scan of the chest, abdomen and pelvis performed 10/02/2020 for evaluation of back pain.  Subsequent PET/CT performed 10/16/2020 demonstrated abnormal hypermetabolic activity with associated right axillary lymph nodes and as such patient presents today for ultrasound-guided right axillary lymph node biopsy.  Patient continues to complain of back pain.  She is otherwise without specific complaint.  Specifically no change in appetite or energy level.  No unintentional weight loss or weight gain.  No chest pain, shortness of breath, fever or chills.  Past Medical History:  Diagnosis Date  . Colon polyps   . Diverticulosis   . Melanoma ([place]) 1988   lt upper arm, some lymph nodes removed  . Mild intermittent asthma   . Osteoporosis     Past Surgical History:  Procedure Laterality Date  . BACK SURGERY    . CATARACT EXTRACTION    . TONSILLECTOMY    . VAGINAL HYSTERECTOMY  1990    Allergies: Patient has no known allergies.  Medications: Prior to Admission medications   Medication Sig Start Date End Date Taking? Authorizing Provider  acetaminophen (TYLENOL) 500 MG tablet Take 500 mg by mouth every 6 (six) hours as needed.   Yes [provider]  Cholecalciferol (VITAMIN D) 50 MCG (2000 UT) CAPS Take 1 capsule by mouth daily.   Yes [provider]  gabapentin (NEURONTIN) 100 MG capsule Take 100 mg by mouth at bedtime. Not sure of dose   Yes [provider]  Lactobacillus (PROBIOTIC ACIDOPHILUS PO) Take 1 capsule by mouth daily at 12 noon.   Yes [provider]  Magnesium Oxide  (MAG-OXIDE PO) Take 1 capsule by mouth daily at 12 noon.   Yes [provider]  meloxicam (MOBIC) 15 MG tablet Take 1 tablet by mouth daily. 09/21/20  Yes [provider]  methocarbamol (ROBAXIN) 500 MG tablet Take 500 mg by mouth in the morning and at bedtime.   Yes [provider]  traMADol (ULTRAM) 50 MG tablet Take 1 tablet (50 mg total) by mouth every 12 (twelve) hours as needed for moderate pain. Patient taking differently: Take 50 mg by mouth 6 (six) times daily. 08/25/20  Yes Sable Feil, PA-C  aspirin 81 MG EC tablet Take 1 tablet by mouth daily at 12 noon.    [provider]  loratadine (CLARITIN) 10 MG tablet Take 1 tablet by mouth as needed. Patient not taking: No sig reported    [provider]     Family History  Problem Relation Age of Onset  . Breast cancer Neg Hx     Social History   Socioeconomic History  . Marital status: Married    Spouse name: Mikki Santee  . Number of children: 2  . Years of education: Not on file  . Highest education level: Not on file  Occupational History  . Not on file  Tobacco Use  . Smoking status: Never Smoker  . Smokeless tobacco: Never Used  Substance and Sexual Activity  . Alcohol use: Never  . Drug use: Never  . Sexual activity: Yes    Partners: Male  Other Topics Concern  . Not on file  Social History Narrative  Lives at home with husband.    Social Determinants of Health   Financial Resource Strain: Not on file  Food Insecurity: Not on file  Transportation Needs: Not on file  Physical Activity: Not on file  Stress: Not on file  Social Connections: Not on file    ECOG Status: 2 - Symptomatic, <50% confined to bed  Review of Systems: A 12 point ROS discussed and pertinent positives are indicated in the HPI above.  All other systems are negative.  Review of Systems  Vital Signs: BP (!) 173/80 (BP Location: Left Arm)   Pulse (!) 107   Temp 98.2 F (36.8 C) (Oral)   Resp  20   Ht 5\' 2"  (1.575 m)   Wt 58.5 kg   SpO2 97%   BMI 23.59 kg/m   Physical Exam  Imaging: MR Abdomen W Wo Contrast  Result Date: 11/02/2020 CLINICAL DATA:  Pancreatic pseudocyst follow-up in a is 76 year old female with history of lymphoma. Found to have a "cystic area" in the head of the pancreas on recent imaging. EXAM: MRI ABDOMEN WITHOUT AND WITH CONTRAST TECHNIQUE: Multiplanar multisequence MR imaging of the abdomen was performed both before and after the administration of intravenous contrast. CONTRAST:  86mL GADAVIST GADOBUTROL 1 MMOL/ML IV SOLN COMPARISON:  CT of the chest, abdomen and pelvis of Oct 03, 2019. FINDINGS: Lower chest: Incidental imaging of the lung bases is unremarkable by MRI. The limited assessment. Hepatobiliary: No focal, suspicious hepatic lesion. No pericholecystic stranding. No biliary duct dilation. Portal vein is patent. Pancreas: Pancreas with normal intrinsic T1 signal. Cystic area in the head of the pancreas appears to communicate with the main pancreatic duct. No signs of main pancreatic ductal dilation. No signs of peripancreatic inflammation. Area measuring 1.5 x 1.1 cm without discrete well-marginated appearance, variegated contour. In the axial plane this measures approximately 10 mm (image 95/oblique reformat of reconstructed high-resolution biliary imaging) abnormality seen on image 37 of series 10 as well. Cystic area in the tail of the pancreas also appears to be in communication with the main pancreatic duct measuring 10 mm on image 67 of reconstructed reformats. No visible enhancement on subtraction imaging though these images are mildly limited by motion related artifact. Spleen:  Normal size without focal lesion. Adrenals/Urinary Tract: Adrenal glands are normal. Symmetric renal enhancement without hydronephrosis. Stomach/Bowel: No acute gastrointestinal process. Limited assessment on MRI. Abundant stool in the colon. Vascular/Lymphatic: Vascular structures in  the abdomen are patent. Signs of adenopathy in the LEFT retroperitoneum and in the lower abdomen as demonstrated on the recent CT of the abdomen and pelvis. LEFT para-aortic lymph node (image 21/16) 2.6 as compared to 2.3 cm. LEFT periaortic lymph node (image 29/16 2.2 cm LEFT para-aortic lymph node previously 2.0 cm. Soft tissue extending from LEFT neural foramen showing enhancement that mirrors enlarged lymph nodes, extending from the LEFT L3 L4 neural foramen. (Image 29/20) measuring approximately 2.6 x 1.3 cm. Enlarging retrocrural lymph nodes, (image 20/18 8 mm not previously visualized beneath the LEFT diaphragmatic crus. Other: No ascites. Musculoskeletal: Signs of cement augmentation at the lower thoracic spine, T12 level as before. IMPRESSION: Enlarging adenopathy in the abdomen extending into lower chest. L3-L4 neural foraminal involvement as discussed on recent PET scan. 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 Given the mildly irregular contour of the dominant area in the pancreatic neck would consider a shorter interval follow-up to establish stability for baseline at 3-6 months with MRI/MRCP. No signs of enhancement in the area  or other high-risk features. Electronically Signed   By: Zetta Bills M.D.   On: 11/02/2020 08:27   NM PET Image Initial (PI) Skull Base To Thigh  Result Date: 10/17/2020 CLINICAL DATA:  Initial treatment strategy for lymphadenopathy. EXAM: NUCLEAR MEDICINE PET SKULL BASE TO THIGH TECHNIQUE: 6.9 mCi F-18 FDG was injected intravenously. Full-ring PET imaging was performed from the skull base to thigh after the radiotracer. CT data was obtained and used for attenuation correction and anatomic localization. Fasting blood glucose: 102 mg/dl COMPARISON:  CT 10/02/2020. FINDINGS: Mediastinal blood pool activity: SUV max 2.5 Liver activity: SUV max 3.0 NECK: No hypermetabolic lymph nodes in the neck. Incidental CT findings: none CHEST: Bulky RIGHT axillary adenopathy  with intense metabolic activity. Adenopathy extends the sub pectoralis nodal station on the RIGHT. Example axillary node measures 14 mm short axis with SUV max equal 16.9 (image 91) No hypermetabolic LEFT axillary nodes. lymphadenectomy clips in the LEFT axilla. No hypermetabolic mediastinal lymph nodes. Incidental CT findings: No suspicious nodularity. ABDOMEN/PELVIS: Spleen is normal volume with normal metabolic activity. Hypermetabolic mass in the retroperitoneum adjacent to the LEFT kidney and psoas measures 2.6 cm with SUV max equal 25.9. Hypermetabolic periaortic nodes along the LEFT side aorta to the bifurcation. Within the central RIGHT lower abdominal mesentery 3.7 cm by 6.7 cm mesenteric mass intensely metabolic with SUV max equal 25.9. No hypermetabolic pelvic adenopathy. Incidental CT findings: none SKELETON: Single focus of intense metabolic activity associated with the RIGHT femoral head measuring approximately 1 cm SUV max equal 4.7 on image 226 is favored unrelated to lymphoma but indeterminate. No CT correlation. Hypermetabolic nodule within the neural foramina of the L3-L4 vertebral body level on the LEFT measuring 1.1 cm with SUV max equal 15.8. Incidental CT findings: none IMPRESSION: 1. Bulky hypermetabolic RIGHT axillary adenopathy consistent with lymphoma. 2. Bulky hypermetabolic periaortic retroperitoneal and large (7 cm) mesenteric mass consistent with lymphoma. 3. Hypermetabolic nodule within the neural foramina at the L3-L4 vertebral level consistent with lymphoma. 4. Normal spleen and marrow activity. 5. Single focus of cortical activity in the RIGHT femoral head is favored non malignant but indeterminate. Electronically Signed   By: Suzy Bouchard M.D.   On: 10/17/2020 16:45    Labs:  CBC: Recent Labs    09/27/20 1238  WBC 9.0  HGB 14.3  HCT 43.9  PLT 539*    COAGS: No results for input(s): INR, APTT in the last 8760 hours.  BMP: Recent Labs    09/27/20 1238  NA  131*  K 3.8  CL 90*  CO2 28  GLUCOSE 112*  BUN 14  CALCIUM 9.9  CREATININE 0.84  GFRNONAA >60    LIVER FUNCTION TESTS: Recent Labs    09/27/20 1238  BILITOT 0.6  AST 21  ALT 17  ALKPHOS 98  PROT 7.3  ALBUMIN 3.8    TUMOR MARKERS: No results for input(s): AFPTM, CEA, CA199, CHROMGRNA in the last 8760 hours.  Assessment and Plan:  Mary Washington is a 75 y.o. female with past medical history significant for melanoma (involving the left upper arm with axillary adenectomy) who was found to have retroperitoneal and right axillary lymphadenopathy on CT scan of the chest, abdomen and pelvis performed 10/02/2020 for evaluation of back pain.  Subsequent PET/CT performed 10/16/2020 demonstrated abnormal hypermetabolic activity with associated right axillary lymph nodes and as such patient presents today for ultrasound-guided right axillary lymph node biopsy.  Risks and benefits of US guided right axillary lymph node biopsy was  discussed with the patient and/or patient's family including, but not limited to bleeding, infection, damage to adjacent structures or low yield requiring additional tests.  All of the patient's questions were answered, patient is agreeable to proceed.  Consent signed and in chart.   Thank you for this interesting consult.  I greatly enjoyed meeting [employer] and look forward to participating in their care.  A copy of this report was sent to the requesting provider on this date.  Electronically Signed: Sandi Mariscal, MD 11/06/2020, 1:59 PM   I spent a total of 15 Minutes in face to face in clinical consultation, greater than 50% of which was counseling/coordinating care for right axillary lymph node biopsy

## 2020-11-08 ENCOUNTER — Encounter: Payer: Self-pay | Admitting: Anatomic Pathology & Clinical Pathology

## 2020-11-08 ENCOUNTER — Encounter: Payer: Self-pay | Admitting: Internal Medicine

## 2020-11-09 ENCOUNTER — Inpatient Hospital Stay: Payer: Medicare Other | Admitting: Internal Medicine

## 2020-11-15 ENCOUNTER — Inpatient Hospital Stay: Payer: Medicare Other | Attending: Internal Medicine | Admitting: Internal Medicine

## 2020-11-15 ENCOUNTER — Encounter: Payer: Self-pay | Admitting: Internal Medicine

## 2020-11-15 ENCOUNTER — Other Ambulatory Visit: Payer: Self-pay

## 2020-11-15 DIAGNOSIS — Z5111 Encounter for antineoplastic chemotherapy: Secondary | ICD-10-CM | POA: Diagnosis present

## 2020-11-15 DIAGNOSIS — Z79899 Other long term (current) drug therapy: Secondary | ICD-10-CM | POA: Diagnosis not present

## 2020-11-15 DIAGNOSIS — R591 Generalized enlarged lymph nodes: Secondary | ICD-10-CM

## 2020-11-15 DIAGNOSIS — C8331 Diffuse large B-cell lymphoma, lymph nodes of head, face, and neck: Secondary | ICD-10-CM | POA: Diagnosis present

## 2020-11-15 MED ORDER — PREDNISONE 50 MG PO TABS
ORAL_TABLET | ORAL | 0 refills | Status: DC
Start: 2020-11-15 — End: 2020-11-19

## 2020-11-15 NOTE — Progress Notes (Signed)
[facility] NOTE  Patient Care Team: [name], MD as PCP - General (Internal Medicine)  CHIEF COMPLAINTS/PURPOSE OF CONSULTATION: abdominal LN  # Incidental- MAY 2022-CT scan chest abdomen pelvis consistent with bulky retroperitoneal adenopathy [6-8 cm]; subpectoral/axillary lymphadenopathy 2 to 2.5 cm; PET June 2022-bulky right axillary/subpectoral; bulky retroperitoneal adenopathy.  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  Repeat MRI 3-6 m  # coloscopy [2022; [facility]]; mammogram- nov 2021-WNL.   # Melanoma [left Upper arm] s/p excision [skin graft- 1988] Oncology History   No history exists.     HISTORY OF PRESENTING ILLNESS:  Mary Washington 75 y.o.  female with acute on chronic low back pain-incidental lymphadenopathy noted on MRI-is here today for the results of her CT scan chest and pelvis.  Patient continues to complain of back pain which seems to her biggest complaint.  Otherwise no nausea no vomiting.  Complains of weight loss.  Chronic constipation.  Review of Systems  Constitutional:  Positive for malaise/fatigue and weight loss. Negative for chills, diaphoresis and fever.  HENT:  Negative for nosebleeds and sore throat.   Eyes:  Negative for double vision.  Respiratory:  Negative for cough, hemoptysis, sputum production, shortness of breath and wheezing.   Cardiovascular:  Negative for chest pain, palpitations, orthopnea and leg swelling.  Gastrointestinal:  Positive for constipation. Negative for abdominal pain, blood in stool, diarrhea, heartburn, melena, nausea and vomiting.  Genitourinary:  Negative for dysuria, frequency and urgency.  Musculoskeletal:  Positive for back pain and joint pain.  Skin: Negative.  Negative for itching and rash.  Neurological:  Negative for  dizziness, tingling, focal weakness, weakness and headaches.  Endo/Heme/Allergies:  Does not bruise/bleed easily.  Psychiatric/Behavioral:  Negative for depression. The patient is not nervous/anxious and does not have insomnia.     MEDICAL HISTORY:  Past Medical History:  Diagnosis Date   Colon polyps    Diverticulosis    Melanoma ([place]) 1988   lt upper arm, some lymph nodes removed   Mild intermittent asthma    Osteoporosis     SURGICAL HISTORY: Past Surgical History:  Procedure Laterality Date   BACK SURGERY     CATARACT EXTRACTION     TONSILLECTOMY     VAGINAL HYSTERECTOMY  1990    SOCIAL HISTORY: Social History   Socioeconomic History   Marital status: Married    Spouse name: Mikki Santee   Number of children: 2   Years of education: Not on file   Highest education level: Not on file  Occupational History   Not on file  Tobacco Use   Smoking status: Never   Smokeless tobacco: Never  Substance and Sexual Activity   Alcohol use: Never   Drug use: Never   Sexual activity: Yes    Partners: Male  Other Topics Concern   Not on file  Social History Narrative   Lives at home with husband.    Social Determinants of Health   Financial Resource Strain: Not on file  Food Insecurity: Not on file  Transportation Needs: Not on file  Physical Activity: Not on file  Stress: Not on file  Social Connections: Not on file  Intimate Partner Violence: Not on file    FAMILY HISTORY: Family History  Problem Relation Age of Onset   Breast cancer Neg Hx     ALLERGIES:  has No Known Allergies.  MEDICATIONS:  Current Outpatient Medications  Medication Sig Dispense Refill   acetaminophen (TYLENOL) 500 MG tablet Take 500 mg by mouth every 6 (six) hours as needed.     Cholecalciferol (VITAMIN D) 50 MCG (2000 UT) CAPS Take 1 capsule by mouth daily.     gabapentin (NEURONTIN) 100 MG capsule Take 100 mg by mouth at bedtime. Not sure of dose     Lactobacillus (PROBIOTIC ACIDOPHILUS PO)  Take 1 capsule by mouth daily at 12 noon.     Magnesium Oxide (MAG-OXIDE PO) Take 1 capsule by mouth daily at 12 noon.     meloxicam (MOBIC) 15 MG tablet Take 1 tablet by mouth daily.     methocarbamol (ROBAXIN) 500 MG tablet Take 500 mg by mouth in the morning and at bedtime.     predniSONE (DELTASONE) 50 MG tablet Take 2 tablets once day x 5 days; Take in AM with breakfast 10 tablet 0   traMADol (ULTRAM) 50 MG tablet Take 1 tablet (50 mg total) by mouth every 12 (twelve) hours as needed for moderate pain. (Patient taking differently: Take 50 mg by mouth 6 (six) times daily.) 12 tablet 0   loratadine (CLARITIN) 10 MG tablet Take 1 tablet by mouth as needed. (Patient not taking: No sig reported)     No current facility-administered medications for this visit.      Marland Kitchen  PHYSICAL EXAMINATION: ECOG PERFORMANCE STATUS: 1 - Symptomatic but completely ambulatory  Vitals:   11/15/20 1111  BP: (!) 148/77  Pulse: (!) 118  Resp: 17  Temp: (!) 97.1 F (36.2 C)  SpO2: 98%   Filed Weights   11/15/20 1111  Weight: 120 lb 12.8 oz (54.8 kg)    Physical Exam Constitutional:      Comments: Accompanied by husband.  In a wheelchair because of back pain.  Right axilla adenopathy.  Approximate 2 to 3 cm in size  HENT:     Head: Normocephalic and atraumatic.     Mouth/Throat:     Pharynx: No oropharyngeal exudate.  Eyes:     Pupils: Pupils are equal, round, and reactive to light.  Cardiovascular:     Rate and Rhythm: Normal rate and regular rhythm.  Pulmonary:     Effort: Pulmonary effort is normal. No respiratory distress.     Breath sounds: Normal breath sounds. No wheezing.  Abdominal:     General: Bowel sounds are normal. There is no distension.     Palpations: Abdomen is soft. There is no mass.     Tenderness: There is no abdominal tenderness. There is no guarding or rebound.  Musculoskeletal:        General: No tenderness. Normal range of motion.     Cervical back: Normal range of  motion and neck supple.  Skin:    General: Skin is warm.  Neurological:     Mental Status: She is alert and oriented to person, place, and time.  Psychiatric:        Mood and Affect: Affect normal.     LABORATORY DATA:  I have reviewed the data as listed Lab Results  Component Value Date   WBC 9.0 09/27/2020   HGB 14.3 09/27/2020   HCT 43.9 09/27/2020   MCV 81.8 09/27/2020   PLT 539 (H) 09/27/2020   Recent Labs    09/27/20 1238  NA 131*  K 3.8  CL 90*  CO2 28  GLUCOSE 112*  BUN 14  CREATININE 0.84  CALCIUM 9.9  GFRNONAA >60  PROT 7.3  ALBUMIN 3.8  AST 21  ALT 17  ALKPHOS 98  BILITOT 0.6    RADIOGRAPHIC STUDIES: I have personally reviewed the radiological images as listed and agreed with the findings in the report.   ASSESSMENT & PLAN:   Generalized lymphadenopathy #Incidental adenopathy-bulky noted on abdomen noted on [MRI-back; [facility]].  PET June 2022-bulky right axillary/subpectoral; bulky retroperitoneal adenopathy.  S/p right axilla lymph node biopsy-pathology pending at [facility] for B-cell non-Hodgkin's-stains pending].  Also discussed that after discussion with Dr.Olney-it is quite possible the patient may need repeat excisional lymph node biopsy.  However await for final pathology at this time.  At least stage III; hold off bone marrow biopsy as it would not change management at this time.  Patient had systemic chemotherapy.  #Given the ongoing left back pain hip pain-recommended trial of prednisone 100 mg once a day for 5 days.  Discussed with prednisone is part of the protocol for lymphomas.  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  Discussed the findings however will await any further work-up given lymphoma is apparent at this time.  #Chronic low back pain/hip pain-likely secondary to lymphoma.  S/p steroid injection- not improved. Tramadol as needed.  Prednisone as well.  #  [id number]:[phone] [home] # follow up TBD- Dr.B  # I reviewed the blood work- with the patient in detail; also reviewed the imaging independently [as summarized above]; and with the patient in detail.    All questions were answered. The patient knows to call the clinic with any problems, questions or concerns.    [name], MD 11/15/2020 2:55 PM

## 2020-11-15 NOTE — Assessment & Plan Note (Addendum)
#  Incidental adenopathy-bulky noted on abdomen noted on [MRI-back; [facility]].  PET June 2022-bulky right axillary/subpectoral; bulky retroperitoneal adenopathy.  S/p right axilla lymph node biopsy-pathology pending at [facility] for B-cell non-Hodgkin's-stains pending].  Also discussed that after discussion with Dr.Olney-it is quite possible the patient may need repeat excisional lymph node biopsy.  However await for final pathology at this time.  At least stage III; hold off bone marrow biopsy as it would not change management at this time.  Patient had systemic chemotherapy.  #Given the ongoing left back pain hip pain-recommended trial of prednisone 100 mg once a day for 5 days.  Discussed with prednisone is part of the protocol for lymphomas.  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  Discussed the findings however will await any further work-up given lymphoma is apparent at this time.  #Chronic low back pain/hip pain-likely secondary to lymphoma.  S/p steroid injection- not improved. Tramadol as needed.  Prednisone as well.  # [id number]:[phone] [home] # follow up TBD- Dr.B  # I reviewed the blood work- with the patient in detail; also reviewed the imaging independently [as summarized above]; and with the patient in detail.

## 2020-11-19 ENCOUNTER — Ambulatory Visit: Payer: Medicare Other | Admitting: Internal Medicine

## 2020-11-19 ENCOUNTER — Other Ambulatory Visit: Payer: Self-pay | Admitting: *Deleted

## 2020-11-19 NOTE — Telephone Encounter (Signed)
Mr Mendel called requesting a refill of the prednisone for patient stating that it has really helped with her pain.

## 2020-11-20 ENCOUNTER — Telehealth: Payer: Self-pay | Admitting: Internal Medicine

## 2020-11-20 ENCOUNTER — Telehealth: Payer: Self-pay | Admitting: *Deleted

## 2020-11-20 DIAGNOSIS — R591 Generalized enlarged lymph nodes: Secondary | ICD-10-CM

## 2020-11-20 LAB — SURGICAL PATHOLOGY

## 2020-11-20 MED ORDER — PREDNISONE 20 MG PO TABS: 40.0000 mg | ORAL_TABLET | Freq: Every day | ORAL | 0 refills | Status: DC

## 2020-11-20 NOTE — Telephone Encounter (Signed)
Dr. Jacinto Reap - please provide follow-up plan. Husband would like to discuss biopsy results.

## 2020-11-20 NOTE — Telephone Encounter (Signed)
Please inform pt/husband that I would to meet tomorrow to dicsuss re: pathology.  Alos, I will try to reach out tonite.   C- please schedule 6/22 at 9:30; MD; cbc/cmp/uric acid; LDH;   GB

## 2020-11-20 NOTE — Telephone Encounter (Signed)
Mychart msg sent to patient/pt's family  re the follow-up apt. Lab orders entered.

## 2020-11-20 NOTE — Telephone Encounter (Signed)
Mary Washington called again this mornig asking about the prednisone prescription, I see that a new ex was sent this morning.,  He also is asking when they will hear back about the biopsy results. Patient currently has no follow up appointment scheduled. Please advise.  SURGICAL PATHOLOGY SURGICAL PATHOLOGY  CASE: [id number]  PATIENT: Mary Washington  Surgical Pathology Report      Specimen Submitted:  A. Lymph node, right axilla; biopsy   Clinical History: Hypermetabolic lymphadenopathy right axilla and  abdomen/pelvis raising concern for lymphoma. No intrathoracic disease.  History of melanoma left arm in 1988.      DIAGNOSIS:  A. LYMPH NODE, RIGHT AXILLA; ULTRASOUND-GUIDED CORE BIOPSY:  - ATYPICAL LYMPHOID INFILTRATE.   Comment:  The case is referred to [facility] for hematopathology  consultation. A revised report will be issued after their review.   GROSS DESCRIPTION:  A. Labeled: Right axilla lymph  Received: Fresh on saline soaked gauze  Collection time: 1:55 PM on 11/06/2020  Placed into formalin time: 2:17 PM on 11/06/2020  Number of needle core biopsy(s): 6 cores and multiple additional  fragments  Length: Range from 0.9 to 1.4 cm  Diameter: 0.1 cm  Description: Received on Telfa pad are cores and fragments of yellow and  pink soft tissue.  The additional fragments are 0.5 x 0.2 x 0.1 cm in  aggregate.  A touch preparation with Diff-Quik staining is performed.  1  core is sent for flow cytometry.  The remainder of the specimen is  submitted for routine histology.  Ink: None  Entirely submitted in cassettes 1-3 with 2 cores in cassette 1, 2 cores  in cassette 2, and 1 core with the remaining fragments in cassette 3.   RB 11/06/2020    Final Diagnosis performed by Bryan Lemma, MD.   Electronically signed  11/08/2020 3:11:02PM  The electronic signature indicates that the named Attending Pathologist  has evaluated the specimen  Technical component performed at [facility],  [address], [place],  [place] [address] Lab: [phone] Dir: Rush Farmer, MD, MMM   Professional component performed at [facility], [facility], [place], [place], [place] [address] Lab: [phone]  Dir: Dellia Nims. Reuel Derby, MD   Resulting Agency [facility] PATH [facility]         Specimen Collected: 11/06/20 14:07 Last Resulted: 11/08/20 15:28

## 2020-11-20 NOTE — Telephone Encounter (Signed)
By his last note f/u was to be TBD

## 2020-11-20 NOTE — Addendum Note (Signed)
Addended by: Gloris Ham on: 11/20/2020 02:29 PM   Modules accepted: Orders

## 2020-11-20 NOTE — Telephone Encounter (Signed)
On 6/21- spoke to husband that the diagnosis: lymphoma. Discussed that we discussed regarding the treatment options tomorrow's visit.

## 2020-11-21 ENCOUNTER — Encounter: Payer: Self-pay | Admitting: Internal Medicine

## 2020-11-21 ENCOUNTER — Inpatient Hospital Stay: Payer: Medicare Other

## 2020-11-21 ENCOUNTER — Inpatient Hospital Stay: Payer: Medicare Other | Admitting: Internal Medicine

## 2020-11-21 ENCOUNTER — Other Ambulatory Visit: Payer: Self-pay

## 2020-11-21 VITALS — BP 151/77 | HR 107 | Resp 17 | Ht 62.0 in | Wt 120.2 lb

## 2020-11-21 DIAGNOSIS — C8581 Other specified types of non-Hodgkin lymphoma, lymph nodes of head, face, and neck: Secondary | ICD-10-CM | POA: Insufficient documentation

## 2020-11-21 DIAGNOSIS — T451X5A Adverse effect of antineoplastic and immunosuppressive drugs, initial encounter: Secondary | ICD-10-CM

## 2020-11-21 DIAGNOSIS — R591 Generalized enlarged lymph nodes: Secondary | ICD-10-CM

## 2020-11-21 DIAGNOSIS — I427 Cardiomyopathy due to drug and external agent: Secondary | ICD-10-CM | POA: Diagnosis not present

## 2020-11-21 DIAGNOSIS — Z5111 Encounter for antineoplastic chemotherapy: Secondary | ICD-10-CM | POA: Diagnosis not present

## 2020-11-21 LAB — CBC WITH DIFFERENTIAL/PLATELET
Abs Immature Granulocytes: 0.13 10*3/uL — ABNORMAL HIGH (ref 0.00–0.07)
Basophils Absolute: 0 10*3/uL (ref 0.0–0.1)
Basophils Relative: 0 %
Eosinophils Absolute: 0 10*3/uL (ref 0.0–0.5)
Eosinophils Relative: 0 %
HCT: 41.9 % (ref 36.0–46.0)
Hemoglobin: 13.6 g/dL (ref 12.0–15.0)
Immature Granulocytes: 1 %
Lymphocytes Relative: 7 %
Lymphs Abs: 1.3 10*3/uL (ref 0.7–4.0)
MCH: 26.9 pg (ref 26.0–34.0)
MCHC: 32.5 g/dL (ref 30.0–36.0)
MCV: 83 fL (ref 80.0–100.0)
Monocytes Absolute: 1.5 10*3/uL — ABNORMAL HIGH (ref 0.1–1.0)
Monocytes Relative: 8 %
Neutro Abs: 15.3 10*3/uL — ABNORMAL HIGH (ref 1.7–7.7)
Neutrophils Relative %: 84 %
Platelets: 658 10*3/uL — ABNORMAL HIGH (ref 150–400)
RBC: 5.05 MIL/uL (ref 3.87–5.11)
RDW: 15.9 % — ABNORMAL HIGH (ref 11.5–15.5)
WBC: 18.3 10*3/uL — ABNORMAL HIGH (ref 4.0–10.5)
nRBC: 0 % (ref 0.0–0.2)

## 2020-11-21 LAB — HEPATITIS C ANTIBODY: HCV Ab: NONREACTIVE

## 2020-11-21 LAB — HEPATITIS B CORE ANTIBODY, IGM: Hep B C IgM: NONREACTIVE

## 2020-11-21 LAB — COMPREHENSIVE METABOLIC PANEL
ALT: 15 U/L (ref 0–44)
AST: 19 U/L (ref 15–41)
Albumin: 3.5 g/dL (ref 3.5–5.0)
Alkaline Phosphatase: 76 U/L (ref 38–126)
Anion gap: 12 (ref 5–15)
BUN: 19 mg/dL (ref 8–23)
CO2: 33 mmol/L — ABNORMAL HIGH (ref 22–32)
Calcium: 10.3 mg/dL (ref 8.9–10.3)
Chloride: 87 mmol/L — ABNORMAL LOW (ref 98–111)
Creatinine, Ser: 0.96 mg/dL (ref 0.44–1.00)
GFR, Estimated: >60 mL/min (ref >60)
Glucose, Bld: 101 mg/dL — ABNORMAL HIGH (ref 70–99)
Potassium: 3.4 mmol/L — ABNORMAL LOW (ref 3.5–5.1)
Sodium: 132 mmol/L — ABNORMAL LOW (ref 135–145)
Total Bilirubin: 0.5 mg/dL (ref 0.3–1.2)
Total Protein: 7 g/dL (ref 6.5–8.1)

## 2020-11-21 LAB — LACTATE DEHYDROGENASE: LDH: 123 U/L (ref 98–192)

## 2020-11-21 LAB — URIC ACID: Uric Acid, Serum: 5.6 mg/dL (ref 2.5–7.1)

## 2020-11-21 LAB — HEPATITIS B SURFACE ANTIGEN: Hepatitis B Surface Ag: NONREACTIVE

## 2020-11-21 MED ORDER — ALLOPURINOL 300 MG PO TABS: 300.0000 mg | ORAL_TABLET | Freq: Two times a day (BID) | ORAL | 2 refills | Status: DC

## 2020-11-21 MED ORDER — ACYCLOVIR 400 MG PO TABS: 400.0000 mg | ORAL_TABLET | Freq: Two times a day (BID) | ORAL | 3 refills | Status: DC

## 2020-11-21 MED ORDER — MONTELUKAST SODIUM 10 MG PO TABS: 10.0000 mg | ORAL_TABLET | Freq: Every day | ORAL | 0 refills | Status: DC

## 2020-11-21 NOTE — Patient Instructions (Signed)
On the day of your procedures (port placement and lymph node biopsy and bone marrow biopsy), you may be given sedation for these procedure. Therefore, you will need a driver to bring you to these appointments and drive you home. On the day of the procedure, please do not eat or drink anything 6 to 8 hours prior to the procedures. Our radiology nurse will contact you as well prior to the procedure to review these instructions.  Lidiya Reise, RN will reach out to you in the next day to give you the exact apt.

## 2020-11-21 NOTE — Progress Notes (Signed)
[facility] NOTE  Patient Care Team: [name], MD as PCP - General (Internal Medicine)  CHIEF COMPLAINTS/PURPOSE OF CONSULTATION: Lymphoma Oncology History Overview Note   # Incidental- MAY 2022-CT scan chest abdomen pelvis consistent with bulky retroperitoneal adenopathy [6-8 cm]; subpectoral/axillary lymphadenopathy 2 to 2.5 cm; PET June 2022-bulky right axillary/subpectoral; bulky retroperitoneal adenopathy.  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  Repeat MRI 3-6 m   # coloscopy [2022; [facility]]; mammogram- nov 2021-WNL.   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 .  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  female with acute on chronic low back pain-incidental lymphadenopathy-is here today with results of the PET scan/right axillary lymph node biopsy.  Patient was started on prednisone at last visit noted to have improvement of the back pain.  Her appetite is improved.  Denies any worsening neck swelling or underarm lymph or swelling.  Review of Systems  Constitutional:  Positive for malaise/fatigue and weight loss. Negative for chills, diaphoresis and fever.  HENT:  Negative for nosebleeds and sore throat.   Eyes:  Negative for double vision.  Respiratory:  Negative for cough,  hemoptysis, sputum production, shortness of breath and wheezing.   Cardiovascular:  Negative for chest pain, palpitations, orthopnea and leg swelling.  Gastrointestinal:  Positive for constipation. Negative for abdominal pain, blood in stool, diarrhea, heartburn, melena, nausea and vomiting.  Genitourinary:  Negative for dysuria, frequency and urgency.  Musculoskeletal:  Positive for back pain and joint pain.  Skin: Negative.  Negative for itching and rash.  Neurological:  Negative for dizziness, tingling, focal weakness, weakness and headaches.  Endo/Heme/Allergies:  Does not bruise/bleed easily.  Psychiatric/Behavioral:  Negative for depression. The patient is not nervous/anxious and does not have insomnia.     MEDICAL HISTORY:  Past Medical History:  Diagnosis Date   Colon polyps    Diverticulosis    Melanoma ([place]) 1988   lt upper arm, some lymph nodes removed   Mild intermittent asthma    Osteoporosis     SURGICAL HISTORY: Past Surgical History:  Procedure Laterality Date   BACK SURGERY     CATARACT EXTRACTION     TONSILLECTOMY     VAGINAL HYSTERECTOMY  1990    SOCIAL HISTORY: Social History   Socioeconomic History   Marital status: Married    Spouse name: Mikki Santee   Number of children: 2   Years of education: Not on file   Highest education level: Not on file  Occupational History   Not on file  Tobacco Use   Smoking status: Never   Smokeless tobacco: Never  Substance and Sexual Activity   Alcohol use: Never   Drug use: Never  Sexual activity: Yes    Partners: Male  Other Topics Concern   Not on file  Social History Narrative   Lives at home with husband.    Social Determinants of Health   Financial Resource Strain: Not on file  Food Insecurity: Not on file  Transportation Needs: Not on file  Physical Activity: Not on file  Stress: Not on file  Social Connections: Not on file  Intimate Partner Violence: Not on file    FAMILY HISTORY: Family History   Problem Relation Age of Onset   Breast cancer Neg Hx     ALLERGIES:  has No Known Allergies.  MEDICATIONS:  Current Outpatient Medications  Medication Sig Dispense Refill   acetaminophen (TYLENOL) 500 MG tablet Take 500 mg by mouth every 6 (six) hours as needed.     acyclovir (ZOVIRAX) 400 MG tablet Take 1 tablet (400 mg total) by mouth 2 (two) times daily. To prevent shingles 60 tablet 3   allopurinol (ZYLOPRIM) 300 MG tablet Take 1 tablet (300 mg total) by mouth 2 (two) times daily. To avoid gout flare up while on chemo 60 tablet 2   Cholecalciferol (VITAMIN D) 50 MCG (2000 UT) CAPS Take 1 capsule by mouth daily.     gabapentin (NEURONTIN) 100 MG capsule Take 100 mg by mouth at bedtime. Not sure of dose     Lactobacillus (PROBIOTIC ACIDOPHILUS PO) Take 1 capsule by mouth daily at 12 noon.     Magnesium Oxide (MAG-OXIDE PO) Take 1 capsule by mouth daily at 12 noon.     meloxicam (MOBIC) 15 MG tablet Take 1 tablet by mouth daily.     methocarbamol (ROBAXIN) 500 MG tablet Take 500 mg by mouth in the morning and at bedtime.     montelukast (SINGULAIR) 10 MG tablet Take 1 tablet (10 mg total) by mouth at bedtime. Start 2 days prior to infusion. Take it for 4 days. 60 tablet 0   predniSONE (DELTASONE) 20 MG tablet Take 2 tablets (40 mg total) by mouth daily with breakfast for 7 days. 14 tablet 0   loratadine (CLARITIN) 10 MG tablet Take 1 tablet by mouth as needed. (Patient not taking: No sig reported)     traMADol (ULTRAM) 50 MG tablet Take 1 tablet (50 mg total) by mouth every 12 (twelve) hours as needed for moderate pain. (Patient taking differently: Take 50 mg by mouth 6 (six) times daily.) 12 tablet 0   No current facility-administered medications for this visit.      Marland Kitchen  PHYSICAL EXAMINATION: ECOG PERFORMANCE STATUS: 1 - Symptomatic but completely ambulatory  Vitals:   11/21/20 0907  BP: (!) 151/77  Pulse: (!) 107  Resp: 17  SpO2: 97%   Filed Weights   11/21/20 0907   Weight: 120 lb 3.2 oz (54.5 kg)    Physical Exam Constitutional:      Comments: Accompanied by husband.  In a wheelchair because of back pain.  Right axilla adenopathy.  Approximate 2 to 3 cm in size  HENT:     Head: Normocephalic and atraumatic.     Mouth/Throat:     Pharynx: No oropharyngeal exudate.  Eyes:     Pupils: Pupils are equal, round, and reactive to light.  Cardiovascular:     Rate and Rhythm: Normal rate and regular rhythm.  Pulmonary:     Effort: Pulmonary effort is normal. No respiratory distress.     Breath sounds: Normal breath sounds. No wheezing.  Abdominal:  General: Bowel sounds are normal. There is no distension.     Palpations: Abdomen is soft. There is no mass.     Tenderness: no abdominal tenderness There is no guarding or rebound.  Musculoskeletal:        General: No tenderness. Normal range of motion.     Cervical back: Normal range of motion and neck supple.  Skin:    General: Skin is warm.  Neurological:     Mental Status: She is alert and oriented to person, place, and time.  Psychiatric:        Mood and Affect: Affect normal.     LABORATORY DATA:  I have reviewed the data as listed Lab Results  Component Value Date   WBC 18.3 (H) 11/21/2020   HGB 13.6 11/21/2020   HCT 41.9 11/21/2020   MCV 83.0 11/21/2020   PLT 658 (H) 11/21/2020   Recent Labs    09/27/20 1238 11/21/20 0850  NA 131* 132*  K 3.8 3.4*  CL 90* 87*  CO2 28 33*  GLUCOSE 112* 101*  BUN 14 19  CREATININE 0.84 0.96  CALCIUM 9.9 10.3  GFRNONAA >60 >60  PROT 7.3 7.0  ALBUMIN 3.8 3.5  AST 21 19  ALT 17 15  ALKPHOS 98 76  BILITOT 0.6 0.5    RADIOGRAPHIC STUDIES: I have personally reviewed the radiological images as listed and agreed with the findings in the report.   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  PET June 2022-bulky right  axillary/subpectoral; bulky retroperitoneal adenopathy.  S/p right axilla lymph node biopsy-large B-cell lymphoma.  FISH panel-quantity not sufficient.  Discussed repeat biopsy; in agreement.  #Given the symptomatic disease-would recommend starting of rituximab pending further work-up ASAP.  Patient is currently on prednisone 40 mg a day [for 7 days until; 6/28].   #Discussed that goal of lymphoma treatment is cure.  However, patient specific chance of cure depend upon further work-up/staging etc.  #Discussed R-CHOP chemotherapy every 3 weeks x 6 cycles.  Discussed therapies cure. I would do interim scan after 2-3 cycles of chemo.  I discussed at length regarding the individual drugs in R-CHOP.   # Discussed the potential side effects including but not limited to-increasing fatigue, nausea vomiting, diarrhea, hair loss, sores in the mouth, increase risk of infection and also neuropathy. Also discussed regarding potential side effects of heart failure/leukemia with Adriamycin.  2D echo-pending.  Discussed that benefits outweigh the risk.  Hepatitis work-up pending.  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  Can consider intrathecal given the proximity of the nerve roots.  Will await other work-up including bone marrow biopsy etc.  # Chemotherapy education; port placement. Hopefully the planned start chemotherapy next week. Antiemetics-Zofran and Compazine; EMLA cream sent to pharmacy.  # # COVID EVUSHELD PROPHYLAXIS: Given the immunosuppressive effects of treatments I would recommend EVUSHELD prophylaxis.  IM injection x2 [same time]-cutdown risk of Covid infection/next 6 months. Patient is vaccinated to [place]. Ms.Causey, [place]-who will speak to the patient.  #Gout/shingles prophylaxis- allopurinol/acyclovir ordered.  * ordered premeds [singulair/ prednisone prior] for rituxan,   # [phone] [home] # ASAP IR port placement; right ax  LN Bx/ bone marrow Biopsy # MUGA scan ASAP # chemo ed ASAP # rituximab  Infusion ASAP.  # follow up July 8th- MD; labs- cbc/cmp/LDH- Dr.B-  # 40 minutes face-to-face with the patient discussing  the above plan of care; more than 50% of time spent on prognosis/ natural history; counseling and coordination.       All questions were answered. The patient knows to call the clinic with any problems, questions or concerns.    [name], MD 11/21/2020 3:23 PM

## 2020-11-21 NOTE — Progress Notes (Signed)

## 2020-11-21 NOTE — Assessment & Plan Note (Addendum)
#  Incidental adenopathy-bulky noted on abdomen noted on [MRI-back; [facility]].  PET June 2022-bulky right axillary/subpectoral; bulky retroperitoneal adenopathy.  S/p right axilla lymph node biopsy-large B-cell lymphoma.  FISH panel-quantity not sufficient.  Discussed repeat biopsy; in agreement.  #Given the symptomatic disease-would recommend starting of rituximab pending further work-up ASAP.  Patient is currently on prednisone 40 mg a day [for 7 days until; 6/28].   #Discussed that goal of lymphoma treatment is cure.  However, patient specific chance of cure depend upon further work-up/staging etc.  #Discussed R-CHOP chemotherapy every 3 weeks x 6 cycles.  Discussed therapies cure. I would do interim scan after 2-3 cycles of chemo.  I discussed at length regarding the individual drugs in R-CHOP.   # Discussed the potential side effects including but not limited to-increasing fatigue, nausea vomiting, diarrhea, hair loss, sores in the mouth, increase risk of infection and also neuropathy. Also discussed regarding potential side effects of heart failure/leukemia with Adriamycin.  2D echo-pending.  Discussed that benefits outweigh the risk.  Hepatitis work-up pending.  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  Can consider intrathecal given the proximity of the nerve roots.  Will await other work-up including bone marrow biopsy etc.  # Chemotherapy education; port placement. Hopefully the planned start chemotherapy next week. Antiemetics-Zofran and Compazine; EMLA cream sent to pharmacy.  # # COVID EVUSHELD PROPHYLAXIS: Given the immunosuppressive effects of treatments I would recommend EVUSHELD prophylaxis.  IM injection x2 [same time]-cutdown risk of Covid infection/next 6 months. Patient is vaccinated to [place]. Ms.Causey, [place]-who will speak to the patient.  #Gout/shingles prophylaxis- allopurinol/acyclovir ordered.  * ordered premeds  [singulair/ prednisone prior] for rituxan,   # [phone] [home] # ASAP IR port placement; right ax LN Bx/ bone marrow Biopsy # MUGA scan ASAP # chemo ed ASAP # rituximab  Infusion ASAP.  # follow up July 8th- MD; labs- cbc/cmp/LDH- Dr.B-  # 40 minutes face-to-face with the patient discussing the above plan of care; more than 50% of time spent on prognosis/ natural history; counseling and coordination.

## 2020-11-22 ENCOUNTER — Telehealth: Payer: Self-pay | Admitting: *Deleted

## 2020-11-22 ENCOUNTER — Telehealth: Payer: Self-pay | Admitting: Internal Medicine

## 2020-11-22 ENCOUNTER — Other Ambulatory Visit: Payer: Self-pay | Admitting: Radiology

## 2020-11-22 NOTE — Telephone Encounter (Signed)
Spoke with patient about the rescheduling of her chemo education class from 6/24 to Monday 6/27. Patient was agreeable.

## 2020-11-22 NOTE — Telephone Encounter (Signed)
Per Willeen Cass in scheduling, patient is aware of all biopsy apts and port placement apts.

## 2020-11-22 NOTE — H&P (Signed)
Chief Complaint: Patient was seen in consultation today for bone marrow biopsy with aspiration, lymph node biopsy and port-a-catheter placement.   Referring Physician(s): [name]  Supervising Physician: Sandi Mariscal  Patient Status: [facility] - Out-pt  History of Present Illness: Mary Washington is a 75 y.o. female with a medical history significant for a remote history of melanoma. During work up for back pain she was found have retroperitoneal and right axillary lymphadenopathy. PET scan performed 10/16/20 demonstrated abnormal hypermetabolic activity including in the right axillary region and on 11/06/20 she underwent lymph node biopsy in IR. 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   Unfortunately there was not enough tissue sample for additional lab testing and Interventional Radiology has been asked to evaluate this patient for a repeat lymph node biopsy. This case has been reviewed and procedure approved by Dr. Earleen Newport.  Interventional Radiology has also been asked to evaluate this patient for an image-guided bone marrow biopsy with aspiration and port-a-catheter placement for chemotherapy.   Past Medical History:  Diagnosis Date   Colon polyps    Diverticulosis    Melanoma ([place]) 1988   lt upper arm, some lymph nodes removed   Mild intermittent asthma    Osteoporosis     Past Surgical History:  Procedure Laterality Date   BACK SURGERY     CATARACT EXTRACTION     TONSILLECTOMY     VAGINAL HYSTERECTOMY  1990    Allergies: Patient has no known allergies.  Medications: Prior to Admission medications   Medication Sig Start Date End Date Taking? Authorizing Provider  acetaminophen (TYLENOL) 500 MG tablet Take 500 mg by mouth every 6 (six) hours as needed.    [provider]  acyclovir (ZOVIRAX) 400 MG tablet Take 1 tablet (400 mg total) by mouth 2 (two) times daily. To prevent shingles 11/21/20   [name], MD  allopurinol (ZYLOPRIM)  300 MG tablet Take 1 tablet (300 mg total) by mouth 2 (two) times daily. To avoid gout flare up while on chemo 11/21/20   [name], MD  Cholecalciferol (VITAMIN D) 50 MCG (2000 UT) CAPS Take 1 capsule by mouth daily.    [provider]  gabapentin (NEURONTIN) 100 MG capsule Take 100 mg by mouth at bedtime. Not sure of dose    [provider]  Lactobacillus (PROBIOTIC ACIDOPHILUS PO) Take 1 capsule by mouth daily at 12 noon.    [provider]  loratadine (CLARITIN) 10 MG tablet Take 1 tablet by mouth as needed. Patient not taking: No sig reported    [provider]  Magnesium Oxide (MAG-OXIDE PO) Take 1 capsule by mouth daily at 12 noon.    [provider]  meloxicam (MOBIC) 15 MG tablet Take 1 tablet by mouth daily. 09/21/20   [provider]  methocarbamol (ROBAXIN) 500 MG tablet Take 500 mg by mouth in the morning and at bedtime.    [provider]  montelukast (SINGULAIR) 10 MG tablet Take 1 tablet (10 mg total) by mouth at bedtime. Start 2 days prior to infusion. Take it for 4 days. 11/21/20   [name], MD  predniSONE (DELTASONE) 20 MG tablet Take 2 tablets (40 mg total) by mouth daily with breakfast for 7 days. 11/20/20 11/27/20  [name], MD  traMADol (ULTRAM) 50 MG tablet Take 1 tablet (50 mg total) by mouth every 12 (twelve) hours as needed for moderate pain. Patient taking differently: Take 50 mg by mouth 6 (six)  times daily. 08/25/20   Sable Feil, PA-C     Family History  Problem Relation Age of Onset   Breast cancer Neg Hx     Social History   Socioeconomic History   Marital status: Married    Spouse name: Mikki Santee   Number of children: 2   Years of education: Not on file   Highest education level: Not on file  Occupational History   Not on file  Tobacco Use   Smoking status: Never   Smokeless tobacco: Never  Substance and Sexual Activity   Alcohol use: Never   Drug use:  Never   Sexual activity: Yes    Partners: Male  Other Topics Concern   Not on file  Social History Narrative   Lives at home with husband.    Social Determinants of Health   Financial Resource Strain: Not on file  Food Insecurity: Not on file  Transportation Needs: Not on file  Physical Activity: Not on file  Stress: Not on file  Social Connections: Not on file    Review of Systems: A 12 point ROS discussed and pertinent positives are indicated in the HPI above.  All other systems are negative.  Review of Systems  Constitutional:  Negative for appetite change and fatigue.  Respiratory:  Negative for cough and shortness of breath.   Cardiovascular:  Negative for chest pain and leg swelling.  Gastrointestinal:  Negative for abdominal pain, diarrhea, nausea and vomiting.  Neurological:  Negative for dizziness and headaches.   Vital Signs: 98.4, BP 166/72, HR 99, RR 18, 96% on Room air  Physical Exam Constitutional:      General: She is not in acute distress.    Appearance: Normal appearance. She is not ill-appearing.  HENT:     Mouth/Throat:     Mouth: Mucous membranes are moist.     Pharynx: Oropharynx is clear.  Cardiovascular:     Rate and Rhythm: Normal rate and regular rhythm.     Pulses: Normal pulses.     Heart sounds: Normal heart sounds.  Pulmonary:     Effort: Pulmonary effort is normal.     Breath sounds: Normal breath sounds.  Abdominal:     General: Bowel sounds are normal.     Palpations: Abdomen is soft.     Tenderness: There is no abdominal tenderness.  Musculoskeletal:     Right lower leg: No edema.     Left lower leg: No edema.  Skin:    General: Skin is warm and dry.  Neurological:     Mental Status: She is alert and oriented to person, place, and time.    Imaging: MR Abdomen W Wo Contrast  Result Date: 11/02/2020 CLINICAL DATA:  Pancreatic pseudocyst follow-up in a is 75 year old female with history of lymphoma. Found to have a "cystic  area" in the head of the pancreas on recent imaging. EXAM: MRI ABDOMEN WITHOUT AND WITH CONTRAST TECHNIQUE: Multiplanar multisequence MR imaging of the abdomen was performed both before and after the administration of intravenous contrast. CONTRAST:  52m GADAVIST GADOBUTROL 1 MMOL/ML IV SOLN COMPARISON:  CT of the chest, abdomen and pelvis of Oct 03, 2019. FINDINGS: Lower chest: Incidental imaging of the lung bases is unremarkable by MRI. The limited assessment. Hepatobiliary: No focal, suspicious hepatic lesion. No pericholecystic stranding. No biliary duct dilation. Portal vein is patent. Pancreas: Pancreas with normal intrinsic T1 signal. Cystic area in the head of the pancreas appears to communicate with the main pancreatic  duct. No signs of main pancreatic ductal dilation. No signs of peripancreatic inflammation. Area measuring 1.5 x 1.1 cm without discrete well-marginated appearance, variegated contour. In the axial plane this measures approximately 10 mm (image 95/oblique reformat of reconstructed high-resolution biliary imaging) abnormality seen on image 37 of series 10 as well. Cystic area in the tail of the pancreas also appears to be in communication with the main pancreatic duct measuring 10 mm on image 67 of reconstructed reformats. No visible enhancement on subtraction imaging though these images are mildly limited by motion related artifact. Spleen:  Normal size without focal lesion. Adrenals/Urinary Tract: Adrenal glands are normal. Symmetric renal enhancement without hydronephrosis. Stomach/Bowel: No acute gastrointestinal process. Limited assessment on MRI. Abundant stool in the colon. Vascular/Lymphatic: Vascular structures in the abdomen are patent. Signs of adenopathy in the LEFT retroperitoneum and in the lower abdomen as demonstrated on the recent CT of the abdomen and pelvis. LEFT para-aortic lymph node (image 21/16) 2.6 as compared to 2.3 cm. LEFT periaortic lymph node (image 29/16 2.2 cm  LEFT para-aortic lymph node previously 2.0 cm. Soft tissue extending from LEFT neural foramen showing enhancement that mirrors enlarged lymph nodes, extending from the LEFT L3 L4 neural foramen. (Image 29/20) measuring approximately 2.6 x 1.3 cm. Enlarging retrocrural lymph nodes, (image 20/18 8 mm not previously visualized beneath the LEFT diaphragmatic crus. Other: No ascites. Musculoskeletal: Signs of cement augmentation at the lower thoracic spine, T12 level as before. IMPRESSION: Enlarging adenopathy in the abdomen extending into lower chest. L3-L4 neural foraminal involvement as discussed on recent PET scan. 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 Given the mildly irregular contour of the dominant area in the pancreatic neck would consider a shorter interval follow-up to establish stability for baseline at 3-6 months with MRI/MRCP. No signs of enhancement in the area or other high-risk features. Electronically Signed   By: Zetta Bills M.D.   On: 11/02/2020 08:27   [place] AXILLARY NODE CORE BIOPSY RIGHT  Result Date: 11/06/2020 INDICATION: History of melanoma, now with indeterminate hypermetabolic right axillary lymphadenopathy. Please perform ultrasound-guided right axillary lymph node biopsy for tissue diagnostic purposes. EXAM: ULTRASOUND-GUIDED RIGHT AXILLARY LYMPH NODE BIOPSY COMPARISON:  PET-CT-10/16/2020 MEDICATIONS: None ANESTHESIA/SEDATION: Moderate (conscious) sedation was employed during this procedure. A total of Versed 1 mg and Fentanyl 50 mcg was administered intravenously. Moderate Sedation Time: 10 minutes. The patient's level of consciousness and vital signs were monitored continuously by radiology nursing throughout the procedure under my direct supervision. COMPLICATIONS: None immediate. TECHNIQUE: Informed written consent was obtained from the patient after a discussion of the risks, benefits and alternatives to treatment. Questions regarding the procedure were encouraged and  answered. Initial ultrasound scanning demonstrated multiple pathologically enlarged right axillary lymph nodes. A dominant approximately 1.3 x 1.1 cm right axillary lymph node was targeted for biopsy given location and sonographic window (image 8). An ultrasound image was saved for documentation purposes. The procedure was planned. A timeout was performed prior to the initiation of the procedure. The operative was prepped and draped in the usual sterile fashion, and a sterile drape was applied covering the operative field. A timeout was performed prior to the initiation of the procedure. Local anesthesia was provided with 1% lidocaine with epinephrine. Under direct ultrasound guidance, an 18 gauge core needle device was utilized to obtain to obtain 6 core needle biopsies of the indeterminate right axillary lymph node. The samples were placed in saline and submitted to pathology. The needle was removed and hemostasis was  achieved with manual compression. Post procedure scan was negative for significant hematoma. A dressing was placed. The patient tolerated the procedure well without immediate postprocedural complication. IMPRESSION: Technically successful ultrasound guided biopsy of indeterminate right axillary lymph node. Electronically Signed   By: Sandi Mariscal M.D.   On: 11/06/2020 15:20    Labs:  CBC: Recent Labs    09/27/20 1238 11/21/20 0850 11/23/20 0757  WBC 9.0 18.3* 12.5*  HGB 14.3 13.6 12.6  HCT 43.9 41.9 39.1  PLT 539* 658* 568*    COAGS: Recent Labs    11/23/20 0757  INR 1.0    BMP: Recent Labs    09/27/20 1238 11/21/20 0850  NA 131* 132*  K 3.8 3.4*  CL 90* 87*  CO2 28 33*  GLUCOSE 112* 101*  BUN 14 19  CALCIUM 9.9 10.3  CREATININE 0.84 0.96  GFRNONAA >60 >60    LIVER FUNCTION TESTS: Recent Labs    09/27/20 1238 11/21/20 0850  BILITOT 0.6 0.5  AST 21 19  ALT 17 15  ALKPHOS 98 76  PROT 7.3 7.0  ALBUMIN 3.8 3.5    TUMOR MARKERS: No results for input(s):  AFPTM, CEA, CA199, CHROMGRNA in the last 8760 hours.  Assessment and Plan:  Large B-Cell Lymphoma; pending chemotherapy: [name], 75 year old female, presents today to the [facility] Interventional Radiology department for an image-guided bone marrow biopsy and aspiration, lymph node biopsy and an image-guided port-a-catheter placement.   Risks and benefits of lymph node biopsy and bone marrow biopsy with aspiration were discussed with the patient and/or patient's family including, but not limited to bleeding, infection, damage to adjacent structures or low yield requiring additional tests.  Risks and benefits of image-guided port-a-catheter placement were discussed with the patient including, but not limited to bleeding, infection, pneumothorax, or fibrin sheath development and need for additional procedures.  All of the patient's questions were answered, patient is agreeable to proceed. She has been NPO. Labs and vitals have been reviewed.   Consent signed and in chart.   Thank you for this interesting consult.  I greatly enjoyed meeting [employer] and look forward to participating in their care.  A copy of this report was sent to the requesting provider on this date.  Electronically Signed: Soyla Dryer, AGACNP-BC [phone] 11/23/2020, 8:30 AM   I spent a total of  30 Minutes   in face to face in clinical consultation, greater than 50% of which was counseling/coordinating care for bone marrow biopsy with aspiration and port-a-catheter placement.

## 2020-11-23 ENCOUNTER — Ambulatory Visit
Admission: RE | Admit: 2020-11-23 | Discharge: 2020-11-23 | Disposition: A | Discharge status: Home / Self Care | Payer: Medicare Other | Source: Ambulatory Visit | Attending: Internal Medicine | Admitting: Internal Medicine

## 2020-11-23 ENCOUNTER — Other Ambulatory Visit: Payer: Self-pay

## 2020-11-23 ENCOUNTER — Ambulatory Visit
Admission: RE | Admit: 2020-11-23 | Discharge: 2020-11-23 | Disposition: A | Discharge status: Home / Self Care | Payer: Medicare Other | Source: Ambulatory Visit | Attending: Interventional Radiology | Admitting: Interventional Radiology

## 2020-11-23 ENCOUNTER — Inpatient Hospital Stay: Payer: Medicare Other

## 2020-11-23 ENCOUNTER — Ambulatory Visit: Admission: RE | Admit: 2020-11-23 | Payer: Medicare Other | Source: Ambulatory Visit

## 2020-11-23 DIAGNOSIS — Z79899 Other long term (current) drug therapy: Secondary | ICD-10-CM | POA: Insufficient documentation

## 2020-11-23 DIAGNOSIS — Z86010 Personal history of colonic polyps: Secondary | ICD-10-CM | POA: Diagnosis not present

## 2020-11-23 DIAGNOSIS — C8581 Other specified types of non-Hodgkin lymphoma, lymph nodes of head, face, and neck: Secondary | ICD-10-CM | POA: Insufficient documentation

## 2020-11-23 DIAGNOSIS — R591 Generalized enlarged lymph nodes: Secondary | ICD-10-CM

## 2020-11-23 HISTORY — PX: IR IMAGING GUIDED PORT INSERTION: IMG5740

## 2020-11-23 LAB — CBC WITH DIFFERENTIAL/PLATELET
Abs Immature Granulocytes: 0.11 10*3/uL — ABNORMAL HIGH (ref 0.00–0.07)
Basophils Absolute: 0 10*3/uL (ref 0.0–0.1)
Basophils Relative: 0 %
Eosinophils Absolute: 0.2 10*3/uL (ref 0.0–0.5)
Eosinophils Relative: 1 %
HCT: 39.1 % (ref 36.0–46.0)
Hemoglobin: 12.6 g/dL (ref 12.0–15.0)
Immature Granulocytes: 1 %
Lymphocytes Relative: 14 %
Lymphs Abs: 1.8 10*3/uL (ref 0.7–4.0)
MCH: 26.8 pg (ref 26.0–34.0)
MCHC: 32.2 g/dL (ref 30.0–36.0)
MCV: 83.2 fL (ref 80.0–100.0)
Monocytes Absolute: 1.5 10*3/uL — ABNORMAL HIGH (ref 0.1–1.0)
Monocytes Relative: 12 %
Neutro Abs: 9 10*3/uL — ABNORMAL HIGH (ref 1.7–7.7)
Neutrophils Relative %: 72 %
Platelets: 568 10*3/uL — ABNORMAL HIGH (ref 150–400)
RBC: 4.7 MIL/uL (ref 3.87–5.11)
RDW: 16.3 % — ABNORMAL HIGH (ref 11.5–15.5)
WBC: 12.5 10*3/uL — ABNORMAL HIGH (ref 4.0–10.5)
nRBC: 0 % (ref 0.0–0.2)

## 2020-11-23 LAB — PROTIME-INR
INR: 1 (ref 0.8–1.2)
Prothrombin Time: 12.8 seconds (ref 11.4–15.2)

## 2020-11-23 MED ORDER — FENTANYL CITRATE (PF) 100 MCG/2ML IJ SOLN
INTRAMUSCULAR | Status: AC | PRN
  Administered 2020-11-23 (×2): 50 ug via INTRAVENOUS

## 2020-11-23 MED ORDER — SODIUM CHLORIDE 0.9 % IV SOLN: INTRAVENOUS | Status: DC

## 2020-11-23 MED ORDER — HEPARIN SOD (PORK) LOCK FLUSH 100 UNIT/ML IV SOLN
INTRAVENOUS | Status: AC
  Filled 2020-11-23: qty 5

## 2020-11-23 MED ORDER — MIDAZOLAM HCL 2 MG/2ML IJ SOLN
INTRAMUSCULAR | Status: AC
  Filled 2020-11-23: qty 2

## 2020-11-23 MED ORDER — FENTANYL CITRATE (PF) 100 MCG/2ML IJ SOLN
INTRAMUSCULAR | Status: AC
  Filled 2020-11-23: qty 2

## 2020-11-23 MED ORDER — MIDAZOLAM HCL 2 MG/2ML IJ SOLN
INTRAMUSCULAR | Status: AC | PRN
  Administered 2020-11-23 (×2): 1 mg via INTRAVENOUS

## 2020-11-23 MED ORDER — MIDAZOLAM HCL 2 MG/2ML IJ SOLN
INTRAMUSCULAR | Status: DC | PRN
  Administered 2020-11-23: 1 mg via INTRAVENOUS

## 2020-11-23 MED ORDER — FENTANYL CITRATE (PF) 100 MCG/2ML IJ SOLN
INTRAMUSCULAR | Status: DC | PRN
  Administered 2020-11-23: 50 ug via INTRAVENOUS

## 2020-11-23 NOTE — Procedures (Signed)
Pre Procedure Dx: Poor venous access Post Procedural Dx: Same  Successful placement of right IJ approach port-a-cath with tip at the superior caval atrial junction. The catheter is ready for immediate use.  Estimated Blood Loss: Minimal  Complications: None immediate.  Jay Serenity Fortner, MD Pager #: [phone]   

## 2020-11-23 NOTE — Procedures (Signed)
Pre Procedure Dx: Recent diagnosis of lymphoma Post Procedural Dx: Same  Technically successful US guided biopsy of right axillary lymph node.  EBL: None  No immediate complications.   Ronny Bacon, MD Pager #: [phone]

## 2020-11-23 NOTE — Procedures (Signed)
Pre-procedure Diagnosis: Recent diagnosis of lymphoma Post-procedure Diagnosis: Same  Technically successful CT guided bone marrow aspiration and biopsy of left iliac crest.   Complications: None Immediate  EBL: None  Signed: Sandi Mariscal Pager: [phone] 11/23/2020, 9:30 AM

## 2020-11-25 DIAGNOSIS — Z5111 Encounter for antineoplastic chemotherapy: Secondary | ICD-10-CM | POA: Diagnosis not present

## 2020-11-26 ENCOUNTER — Other Ambulatory Visit: Payer: Self-pay | Admitting: Internal Medicine

## 2020-11-26 ENCOUNTER — Telehealth: Payer: Self-pay | Admitting: *Deleted

## 2020-11-26 ENCOUNTER — Inpatient Hospital Stay: Payer: Medicare Other

## 2020-11-26 ENCOUNTER — Other Ambulatory Visit: Payer: Self-pay | Admitting: *Deleted

## 2020-11-26 DIAGNOSIS — C8581 Other specified types of non-Hodgkin lymphoma, lymph nodes of head, face, and neck: Secondary | ICD-10-CM

## 2020-11-26 MED ORDER — ONDANSETRON HCL 8 MG PO TABS: 8.0000 mg | ORAL_TABLET | Freq: Two times a day (BID) | ORAL | 1 refills | Status: DC | PRN

## 2020-11-26 MED ORDER — PROCHLORPERAZINE MALEATE 10 MG PO TABS: 10.0000 mg | ORAL_TABLET | Freq: Four times a day (QID) | ORAL | 6 refills | Status: DC | PRN

## 2020-11-26 MED ORDER — LIDOCAINE-PRILOCAINE 2.5-2.5 % EX CREA: TOPICAL_CREAM | CUTANEOUS | 3 refills | Status: DC

## 2020-11-26 MED ORDER — PREDNISONE 50 MG PO TABS: 100.0000 mg | ORAL_TABLET | Freq: Every day | ORAL | 5 refills | Status: AC

## 2020-11-26 NOTE — Telephone Encounter (Signed)
Rn Spoke with pt's husband. He is aware that the prednisone rx was sent to patient's pharmacy. He thanked me for calling.

## 2020-11-26 NOTE — Telephone Encounter (Signed)
Dr. Jacinto Reap- please advise regarding the prednisone.  Insurance prefers Ziextenzo. Please update treatment plan.   Cleora Fleet updated patient/family regarding the biosimiliar Ziextenzo (preferred drug from insurance).

## 2020-11-26 NOTE — Telephone Encounter (Signed)
Spoke with Luann from prior [place] team- insurance would prefer Ziextenzo

## 2020-11-26 NOTE — Progress Notes (Signed)
Prednisone ordered

## 2020-11-26 NOTE — Telephone Encounter (Signed)
error 

## 2020-11-26 NOTE — Telephone Encounter (Addendum)
Mr Parish called stating that patient took her last Prednisone this morning and didn't know if it needs to be refilled or not. He also stated that he got notified by insurance that the [place] has been denied and he wants to know what is being done about getting an alternative ordered. The patient has an appointment for chemotherapy education this morning and Mr Lanahan would like to be contacted about this while in cancer center if possible.

## 2020-11-27 ENCOUNTER — Other Ambulatory Visit: Payer: Self-pay

## 2020-11-27 ENCOUNTER — Encounter: Payer: Self-pay | Admitting: Internal Medicine

## 2020-11-27 ENCOUNTER — Encounter
Admission: RE | Admit: 2020-11-27 | Discharge: 2020-11-27 | Disposition: A | Discharge status: Home / Self Care | Payer: Medicare Other | Source: Ambulatory Visit | Attending: Internal Medicine | Admitting: Internal Medicine

## 2020-11-27 ENCOUNTER — Other Ambulatory Visit: Payer: Self-pay | Admitting: Adult Health

## 2020-11-27 DIAGNOSIS — I427 Cardiomyopathy due to drug and external agent: Secondary | ICD-10-CM | POA: Diagnosis present

## 2020-11-27 DIAGNOSIS — T451X5A Adverse effect of antineoplastic and immunosuppressive drugs, initial encounter: Secondary | ICD-10-CM | POA: Insufficient documentation

## 2020-11-27 DIAGNOSIS — C8581 Other specified types of non-Hodgkin lymphoma, lymph nodes of head, face, and neck: Secondary | ICD-10-CM

## 2020-11-27 LAB — SURGICAL PATHOLOGY

## 2020-11-27 MED ORDER — TECHNETIUM TC 99M-LABELED RED BLOOD CELLS IV KIT
20.0600 | PACK | Freq: Once | INTRAVENOUS | Status: AC | PRN
  Administered 2020-11-27: 20.06 via INTRAVENOUS

## 2020-11-27 NOTE — Progress Notes (Signed)
I connected by phone with [name] on 11/27/2020, 10:34 AM to discuss the potential use of a new treatment, tixagevimab/cilgavimab, for pre-exposure prophylaxis for prevention of coronavirus disease 2019 (COVID-19) caused by the SARS-CoV-2 virus.  This patient is a 75 y.o. female that meets the FDA criteria for Emergency Use Authorization of tixagevimab/cilgavimab for pre-exposure prophylaxis of COVID-19 disease. Pt meets following criteria: Age >12 yr and weight > 40kg Not currently infected with SARS-CoV-2 and has no known recent exposure to an individual infected with SARS-CoV-2 AND Who has moderate to severe immune compromise due to a medical condition or receipt of immunosuppressive medications or treatments and may not mount an adequate immune response to COVID-19 vaccination or  Vaccination with any available COVID-19 vaccine, according to the approved or authorized schedule, is not recommended due to a history of severe adverse reaction (e.g., severe allergic reaction) to a COVID-19 vaccine(s) and/or COVID-19 vaccine component(s).  Patient meets the following definition of mod-severe immune compromised status: 1. Received B-cell depleting therapies (e.g. rituximab, obinutuzumab, ocrelizumab, alemtuzumab) within last 6 months & age > or = 38  I have spoken and communicated the following to the patient or parent/caregiver regarding COVID monoclonal antibody treatment:  FDA has authorized the emergency use of tixagevimab/cilgavimab for the pre-exposure prophylaxis of COVID-19 in patients with moderate-severe immunocompromised status, who meet above EUA criteria.  The significant known and potential risks and benefits of COVID monoclonal antibody, and the extent to which such potential risks and benefits are unknown.  Information on available alternative treatments and the risks and benefits of those alternatives, including clinical trials.  The patient or parent/caregiver has the option to  accept or refuse COVID monoclonal antibody treatment.  After reviewing this information with the patient, agree to receive tixagevimab/cilgavimab.  To receive tomorrow prior to infusion.    Scot Dock, NP, 11/27/2020, 10:34 AM

## 2020-11-28 ENCOUNTER — Other Ambulatory Visit: Payer: Self-pay | Admitting: Internal Medicine

## 2020-11-28 ENCOUNTER — Inpatient Hospital Stay: Payer: Medicare Other

## 2020-11-28 VITALS — BP 164/82 | HR 132 | Temp 98.0°F | Resp 20 | Wt 121.0 lb

## 2020-11-28 DIAGNOSIS — Z5111 Encounter for antineoplastic chemotherapy: Secondary | ICD-10-CM | POA: Diagnosis not present

## 2020-11-28 DIAGNOSIS — C8581 Other specified types of non-Hodgkin lymphoma, lymph nodes of head, face, and neck: Secondary | ICD-10-CM

## 2020-11-28 MED ORDER — SODIUM CHLORIDE 0.9 % IV SOLN
Freq: Once | INTRAVENOUS | Status: AC
  Filled 2020-11-28: qty 250

## 2020-11-28 MED ORDER — CILGAVIMAB (PART OF EVUSHELD) INJECTION
300.0000 mg | Freq: Once | INTRAMUSCULAR | Status: AC
  Administered 2020-11-28: 300 mg via INTRAMUSCULAR
  Filled 2020-11-28: qty 3

## 2020-11-28 MED ORDER — HEPARIN SOD (PORK) LOCK FLUSH 100 UNIT/ML IV SOLN
500.0000 [IU] | Freq: Once | INTRAVENOUS | Status: AC | PRN
  Administered 2020-11-28: 500 [IU]
  Filled 2020-11-28: qty 5

## 2020-11-28 MED ORDER — ACETAMINOPHEN 325 MG PO TABS
650.0000 mg | ORAL_TABLET | Freq: Once | ORAL | Status: AC
Start: 2020-11-28 — End: 2020-11-28
  Administered 2020-11-28: 650 mg via ORAL
  Filled 2020-11-28: qty 2

## 2020-11-28 MED ORDER — SODIUM CHLORIDE 0.9 % IV SOLN
10.0000 mg | Freq: Once | INTRAVENOUS | Status: AC
  Administered 2020-11-28: 10 mg via INTRAVENOUS
  Filled 2020-11-28: qty 10

## 2020-11-28 MED ORDER — TIXAGEVIMAB (PART OF EVUSHELD) INJECTION
300.0000 mg | Freq: Once | INTRAMUSCULAR | Status: AC
  Administered 2020-11-28: 300 mg via INTRAMUSCULAR
  Filled 2020-11-28: qty 3

## 2020-11-28 MED ORDER — SODIUM CHLORIDE 0.9% FLUSH
10.0000 mL | INTRAVENOUS | Status: DC | PRN
Start: 2020-11-28 — End: 2020-11-28
  Administered 2020-11-28: 10 mL
  Filled 2020-11-28: qty 10

## 2020-11-28 MED ORDER — DIPHENHYDRAMINE HCL 25 MG PO CAPS
50.0000 mg | ORAL_CAPSULE | Freq: Once | ORAL | Status: AC
  Administered 2020-11-28: 50 mg via ORAL
  Filled 2020-11-28: qty 2

## 2020-11-28 MED ORDER — SODIUM CHLORIDE 0.9 % IV SOLN
375.0000 mg/m2 | Freq: Once | INTRAVENOUS | Status: AC
  Administered 2020-11-28: 600 mg via INTRAVENOUS
  Filled 2020-11-28: qty 50

## 2020-11-28 NOTE — Patient Instructions (Signed)
[facility]  Discharge Instructions: Thank you for choosing [facility] to provide your oncology and hematology care.  If you have a lab appointment with the [facility], please go directly to the [facility] and check in at the registration area.  Wear comfortable clothing and clothing appropriate for easy access to any Portacath or PICC line.   We strive to give you quality time with your provider. You may need to reschedule your appointment if you arrive late (15 or more minutes).  Arriving late affects you and other patients whose appointments are after yours.  Also, if you miss three or more appointments without notifying the office, you may be dismissed from the clinic at the provider's discretion.      For prescription refill requests, have your pharmacy contact our office and allow 72 hours for refills to be completed.    Today you received the following chemotherapy and/or immunotherapy agents Ruxience      To help prevent nausea and vomiting after your treatment, we encourage you to take your nausea medication as directed.  BELOW ARE SYMPTOMS THAT SHOULD BE REPORTED IMMEDIATELY: *FEVER GREATER THAN 100.4 F (38 C) OR HIGHER *CHILLS OR SWEATING *NAUSEA AND VOMITING THAT IS NOT CONTROLLED WITH YOUR NAUSEA MEDICATION *UNUSUAL SHORTNESS OF BREATH *UNUSUAL BRUISING OR BLEEDING *URINARY PROBLEMS (pain or burning when urinating, or frequent urination) *BOWEL PROBLEMS (unusual diarrhea, constipation, pain near the anus) TENDERNESS IN MOUTH AND THROAT WITH OR WITHOUT PRESENCE OF ULCERS (sore throat, sores in mouth, or a toothache) UNUSUAL RASH, SWELLING OR PAIN  UNUSUAL VAGINAL DISCHARGE OR ITCHING   Items with * indicate a potential emergency and should be followed up as soon as possible or go to the Emergency Department if any problems should occur.  Please show the CHEMOTHERAPY ALERT CARD or IMMUNOTHERAPY ALERT CARD at check-in to  the Emergency Department and triage nurse.  Should you have questions after your visit or need to cancel or reschedule your appointment, please contact [facility]  [phone] and follow the prompts.  Office hours are 8:00 a.m. to 4:30 p.m. Monday - Friday. Please note that voicemails left after 4:00 p.m. may not be returned until the following business day.  We are closed weekends and major holidays. You have access to a nurse at all times for urgent questions. Please call the main number to the clinic [phone] and follow the prompts.  For any non-urgent questions, you may also contact your provider using MyChart. We now offer e-Visits for anyone 3 and older to request care online for non-urgent symptoms. For details visit [email or web address].   Also download the MyChart app! Go to the app store, search "MyChart", open the app, select [facility], and log in with your MyChart username and password.  Due to Covid, a mask is required upon entering the hospital/clinic. If you do not have a mask, one will be given to you upon arrival. For doctor visits, patients may have 1 support person aged 63 or older with them. For treatment visits, patients cannot have anyone with them due to current Covid guidelines and our immunocompromised population. Rituximab Injection What is this medication? RITUXIMAB (ri TUX i mab) is a monoclonal antibody. It is used to treat certain types of cancer like non-Hodgkin lymphoma and chronic lymphocytic leukemia. It is also used to treat rheumatoid arthritis, granulomatosis with polyangiitis,microscopic polyangiitis, and pemphigus vulgaris. This medicine may be used for other purposes; ask your health  care provider orpharmacist if you have questions. COMMON BRAND NAME(S): RIABNI, Rituxan, RUXIENCE What should I tell my care team before I take this medication? They need to know if you have any of these conditions: chest pain heart  disease infection especially a viral infection such as chickenpox, cold sores, hepatitis B, or herpes immune system problems irregular heartbeat or rhythm kidney disease low blood counts (white cells, platelets, or red cells) lung disease recent or upcoming vaccine an unusual or allergic reaction to rituximab, other medicines, foods, dyes, or preservatives pregnant or trying to get pregnant breast-feeding How should I use this medication? This medicine is injected into a vein. It is given by a health care provider ina hospital or clinic setting. A special MedGuide will be given to you before each treatment. Be sure to readthis information carefully each time. Talk to your health care provider about the use of this medicine in children. While this drug may be prescribed for children as young as 6 months forselected conditions, precautions do apply. Overdosage: If you think you have taken too much of this medicine contact apoison control center or emergency room at once. NOTE: This medicine is only for you. Do not share this medicine with others. What if I miss a dose? Keep appointments for follow-up doses. It is important not to miss your dose.Call your health care provider if you are unable to keep an appointment. What may interact with this medication? Do not take this medicine with any of the following medicines: live vaccines This medicine may also interact with the following medicines: cisplatin This list may not describe all possible interactions. Give your health care provider a list of all the medicines, herbs, non-prescription drugs, or dietary supplements you use. Also tell them if you smoke, drink alcohol, or use illegaldrugs. Some items may interact with your medicine. What should I watch for while using this medication? Your condition will be monitored carefully while you are receiving thismedicine. You may need blood work done while you are taking this medicine. This medicine  can cause serious infusion reactions. To reduce the risk your health care provider may give you other medicines to take before receiving thisone. Be sure to follow the directions from your health care provider. This medicine may increase your risk of getting an infection. Call your health care provider for advice if you get a fever, chills, sore throat, or other symptoms of a cold or flu. Do not treat yourself. Try to avoid being aroundpeople who are sick. Call your health care provider if you are around anyone with measles,chickenpox, or if you develop sores or blisters that do not heal properly. Avoid taking medicines that contain aspirin, acetaminophen, ibuprofen, naproxen, or ketoprofen unless instructed by your health care provider. Thesemedicines may hide a fever. This medicine may cause serious skin reactions. They can happen weeks to months after starting the medicine. Contact your health care provider right away if you notice fevers or flu-like symptoms with a rash. The rash may be red or purple and then turn into blisters or peeling of the skin. Or, you might notice a red rash with swelling of the face, lips or lymph nodes in your neck or underyour arms. In some patients, this medicine may cause a serious brain infection that may cause death. If you have any problems seeing, thinking, speaking, walking, or standing, tell your healthcare professional right away. If you cannot reachyour healthcare professional, urgently seek other source of medical care. Do not become pregnant while  taking this medicine or for at least 12 months after stopping it. Women should inform their health care provider if they wish to become pregnant or think they might be pregnant. There is potential for serious harm to an unborn child. Talk to your health care provider for more information. Women should use a reliable form of birth control while taking this medicine and for 12 months after stopping it. Do not breast-feed  whiletaking this medicine or for at least 6 months after stopping it. What side effects may I notice from receiving this medication? Side effects that you should report to your health care provider as soon aspossible: allergic reactions (skin rash, itching or hives; swelling of the face, lips, or tongue) diarrhea edema (sudden weight gain; swelling of the ankles, feet, hands or other unusual swelling; trouble breathing) fast, irregular heartbeat heart attack (trouble breathing; pain or tightness in the chest, neck, back or arms; unusually weak or tired) infection (fever, chills, cough, sore throat, pain or trouble passing urine) kidney injury (trouble passing urine or change in the amount of urine) liver injury (dark yellow or brown urine; general ill feeling or flu-like symptoms; loss of appetite, right upper belly pain; unusually weak or tired, yellowing of the eyes or skin) low blood pressure (dizziness; feeling faint or lightheaded, falls; unusually weak or tired) low red blood cell counts (trouble breathing; feeling faint; lightheaded, falls; unusually weak or tired) mouth sores redness, blistering, peeling, or loosening of the skin, including inside the mouth stomach pain unusual bruising or bleeding wheezing (trouble breathing with loud or whistling sounds) vomiting Side effects that usually do not require medical attention (report to yourhealth care provider if they continue or are bothersome): headache joint pain muscle cramps, pain nausea This list may not describe all possible side effects. Call your doctor for medical advice about side effects. You may report side effects to FDA at1-800-FDA-1088. Where should I keep my medication? This medicine is given in a hospital or clinic. It will not be stored at home. NOTE: This sheet is a summary. It may not cover all possible information. If you have questions about this medicine, talk to your doctor, pharmacist, orhealth care  provider.  2022 Elsevier/Gold Standard (2020-05-10 15:47:26)

## 2020-11-28 NOTE — Progress Notes (Signed)
HR 108. Per Dr Rogue Bussing okay to proceed with treatment

## 2020-11-29 ENCOUNTER — Telehealth: Payer: Self-pay

## 2020-11-29 ENCOUNTER — Other Ambulatory Visit: Payer: Medicare Other

## 2020-11-29 NOTE — Telephone Encounter (Signed)
Telephone call to patient for follow up after receiving first infusion.   Patient states infusion went great.  States eating good and drinking plenty of fluids.   Denies any nausea or vomiting.  Encouraged patient to call for any concerns or questions. 

## 2020-11-30 ENCOUNTER — Encounter (HOSPITAL_COMMUNITY): Payer: Self-pay | Admitting: Internal Medicine

## 2020-11-30 ENCOUNTER — Other Ambulatory Visit: Payer: Self-pay | Admitting: Internal Medicine

## 2020-11-30 ENCOUNTER — Telehealth: Payer: Self-pay | Admitting: Internal Medicine

## 2020-11-30 NOTE — Telephone Encounter (Signed)
C-please schedule the patient for Ziextenzo for 07/11.

## 2020-11-30 NOTE — Telephone Encounter (Signed)
C-please schedule the patient for Ziextenzo for 07/11.   Spoke to pt/husband- re: results of the MUGA scan.  And plan for chemotherapy/growth factor.  Follow-up as planned.   GB

## 2020-11-30 NOTE — Telephone Encounter (Signed)
Rn Made the apt for Ziextenzo injection

## 2020-12-07 ENCOUNTER — Other Ambulatory Visit: Payer: Self-pay

## 2020-12-07 ENCOUNTER — Inpatient Hospital Stay (HOSPITAL_BASED_OUTPATIENT_CLINIC_OR_DEPARTMENT_OTHER): Payer: Medicare Other | Admitting: Internal Medicine

## 2020-12-07 ENCOUNTER — Inpatient Hospital Stay: Payer: Medicare Other | Attending: Internal Medicine

## 2020-12-07 ENCOUNTER — Inpatient Hospital Stay: Payer: Medicare Other

## 2020-12-07 ENCOUNTER — Encounter: Payer: Self-pay | Admitting: Internal Medicine

## 2020-12-07 DIAGNOSIS — Z79899 Other long term (current) drug therapy: Secondary | ICD-10-CM | POA: Diagnosis not present

## 2020-12-07 DIAGNOSIS — Z5111 Encounter for antineoplastic chemotherapy: Secondary | ICD-10-CM | POA: Insufficient documentation

## 2020-12-07 DIAGNOSIS — M549 Dorsalgia, unspecified: Secondary | ICD-10-CM | POA: Insufficient documentation

## 2020-12-07 DIAGNOSIS — G8929 Other chronic pain: Secondary | ICD-10-CM | POA: Diagnosis not present

## 2020-12-07 DIAGNOSIS — C8581 Other specified types of non-Hodgkin lymphoma, lymph nodes of head, face, and neck: Secondary | ICD-10-CM

## 2020-12-07 DIAGNOSIS — C8331 Diffuse large B-cell lymphoma, lymph nodes of head, face, and neck: Secondary | ICD-10-CM | POA: Insufficient documentation

## 2020-12-07 LAB — CBC WITH DIFFERENTIAL/PLATELET
Abs Immature Granulocytes: 0.15 10*3/uL — ABNORMAL HIGH (ref 0.00–0.07)
Basophils Absolute: 0 10*3/uL (ref 0.0–0.1)
Basophils Relative: 0 %
Eosinophils Absolute: 0.1 10*3/uL (ref 0.0–0.5)
Eosinophils Relative: 1 %
HCT: 39.2 % (ref 36.0–46.0)
Hemoglobin: 12.7 g/dL (ref 12.0–15.0)
Immature Granulocytes: 1 %
Lymphocytes Relative: 9 %
Lymphs Abs: 1.9 10*3/uL (ref 0.7–4.0)
MCH: 27.1 pg (ref 26.0–34.0)
MCHC: 32.4 g/dL (ref 30.0–36.0)
MCV: 83.8 fL (ref 80.0–100.0)
Monocytes Absolute: 1 10*3/uL (ref 0.1–1.0)
Monocytes Relative: 5 %
Neutro Abs: 16.7 10*3/uL — ABNORMAL HIGH (ref 1.7–7.7)
Neutrophils Relative %: 84 %
Platelets: 500 10*3/uL — ABNORMAL HIGH (ref 150–400)
RBC: 4.68 MIL/uL (ref 3.87–5.11)
RDW: 17 % — ABNORMAL HIGH (ref 11.5–15.5)
WBC: 19.9 10*3/uL — ABNORMAL HIGH (ref 4.0–10.5)
nRBC: 0 % (ref 0.0–0.2)

## 2020-12-07 LAB — COMPREHENSIVE METABOLIC PANEL
ALT: 24 U/L (ref 0–44)
AST: 26 U/L (ref 15–41)
Albumin: 3.4 g/dL — ABNORMAL LOW (ref 3.5–5.0)
Alkaline Phosphatase: 67 U/L (ref 38–126)
Anion gap: 10 (ref 5–15)
BUN: 18 mg/dL (ref 8–23)
CO2: 31 mmol/L (ref 22–32)
Calcium: 8.9 mg/dL (ref 8.9–10.3)
Chloride: 92 mmol/L — ABNORMAL LOW (ref 98–111)
Creatinine, Ser: 1 mg/dL (ref 0.44–1.00)
GFR, Estimated: 59 mL/min — ABNORMAL LOW (ref >60)
Glucose, Bld: 160 mg/dL — ABNORMAL HIGH (ref 70–99)
Potassium: 3 mmol/L — ABNORMAL LOW (ref 3.5–5.1)
Sodium: 133 mmol/L — ABNORMAL LOW (ref 135–145)
Total Bilirubin: 0.7 mg/dL (ref 0.3–1.2)
Total Protein: 6.3 g/dL — ABNORMAL LOW (ref 6.5–8.1)

## 2020-12-07 LAB — LACTATE DEHYDROGENASE: LDH: 153 U/L (ref 98–192)

## 2020-12-07 LAB — SURGICAL PATHOLOGY

## 2020-12-07 MED ORDER — VINCRISTINE SULFATE CHEMO INJECTION 1 MG/ML
2.0000 mg | Freq: Once | INTRAVENOUS | Status: AC
  Administered 2020-12-07: 2 mg via INTRAVENOUS
  Filled 2020-12-07: qty 2

## 2020-12-07 MED ORDER — SODIUM CHLORIDE 0.9 % IV SOLN
750.0000 mg/m2 | Freq: Once | INTRAVENOUS | Status: AC
  Administered 2020-12-07: 1160 mg via INTRAVENOUS
  Filled 2020-12-07: qty 50

## 2020-12-07 MED ORDER — DEXAMETHASONE SODIUM PHOSPHATE 100 MG/10ML IJ SOLN
10.0000 mg | Freq: Once | INTRAMUSCULAR | Status: AC
  Administered 2020-12-07: 10 mg via INTRAVENOUS
  Filled 2020-12-07: qty 10

## 2020-12-07 MED ORDER — DOXORUBICIN HCL CHEMO IV INJECTION 2 MG/ML
50.0000 mg/m2 | Freq: Once | INTRAVENOUS | Status: AC
  Administered 2020-12-07: 78 mg via INTRAVENOUS
  Filled 2020-12-07: qty 25

## 2020-12-07 MED ORDER — SODIUM CHLORIDE 0.9 % IV SOLN
Freq: Once | INTRAVENOUS | Status: AC
  Filled 2020-12-07: qty 250

## 2020-12-07 MED ORDER — HEPARIN SOD (PORK) LOCK FLUSH 100 UNIT/ML IV SOLN
500.0000 [IU] | Freq: Once | INTRAVENOUS | Status: AC | PRN
  Administered 2020-12-07: 500 [IU]
  Filled 2020-12-07: qty 5

## 2020-12-07 MED ORDER — DIPHENHYDRAMINE HCL 25 MG PO CAPS
50.0000 mg | ORAL_CAPSULE | Freq: Once | ORAL | Status: AC
Start: 2020-12-07 — End: 2020-12-07
  Administered 2020-12-07: 50 mg via ORAL
  Filled 2020-12-07: qty 2

## 2020-12-07 MED ORDER — SODIUM CHLORIDE 0.9 % IV SOLN
150.0000 mg | Freq: Once | INTRAVENOUS | Status: AC
  Administered 2020-12-07: 150 mg via INTRAVENOUS
  Filled 2020-12-07: qty 150

## 2020-12-07 MED ORDER — ACETAMINOPHEN 325 MG PO TABS
650.0000 mg | ORAL_TABLET | Freq: Once | ORAL | Status: AC
  Administered 2020-12-07: 650 mg via ORAL
  Filled 2020-12-07: qty 2

## 2020-12-07 MED ORDER — PALONOSETRON HCL INJECTION 0.25 MG/5ML
0.2500 mg | Freq: Once | INTRAVENOUS | Status: AC
  Administered 2020-12-07: 0.25 mg via INTRAVENOUS
  Filled 2020-12-07: qty 5

## 2020-12-07 MED ORDER — SODIUM CHLORIDE 0.9 % IV SOLN
375.0000 mg/m2 | Freq: Once | INTRAVENOUS | Status: AC
  Administered 2020-12-07: 600 mg via INTRAVENOUS
  Filled 2020-12-07: qty 50

## 2020-12-07 MED ORDER — HEPARIN SOD (PORK) LOCK FLUSH 100 UNIT/ML IV SOLN
INTRAVENOUS | Status: AC
  Filled 2020-12-07: qty 5

## 2020-12-07 NOTE — Patient Instructions (Addendum)
Take the Zofran -Take 1 tablet (8 mg total) by mouth 2 (two) times daily as needed for refractory nausea / vomiting. Start on day 3 after cyclophosphamide chemotherapy   Take Compazine Take 1 tablet (10 mg total) by mouth every 6 (six) hours as needed (Nausea or vomiting).

## 2020-12-07 NOTE — Assessment & Plan Note (Addendum)
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  S/p right axilla lymph node biopsy-large B-cell lymphoma.  FISH panel-quantity not sufficient-s/p repeat lymph node/bone marrow biopsy-pending.  Currently s/p cycle #1 of Rituxan ONLY 1 week ago.  Tolerated well.   #Proceed with R-CHOP chemotherapy cycle #1 today. Labs today reviewed;  acceptable for treatment today.  Again reviewed the potential side effects including but not limited to nausea vomiting diarrhea hair loss risk of infections etc. MUGA scan [6/28]:Left ventricular ejection fraction equals  55.9 %.  Recommend Claritin.   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  Can consider intrathecal given the proximity of the nerve roots.  Await above work-up including bone marrow biopsy.  # Chemotherapy education; port placement. Hopefully the planned start chemotherapy next week. Antiemetics-Zofran and Compazine; EMLA cream sent to pharmacy.  # s/p COVID EVUSHELD PROPHYLAXIS/ vaccinated to [place].   #Gout/shingles prophylaxis- allopurinol/acyclovir ordered.  # [id number]:[phone] [home] # chemo today  # Ziextenzo on 7/11 # in 10days- labs- cbc/bmp;possible IVFs # follow up July 29th MD; labs- cbc/cmp/LDH; R-CHOP chemo;8/01 Ziextenzo on- Dr.B

## 2020-12-07 NOTE — Progress Notes (Signed)
Pulse Rate: 114. MD, Dr. Rogue Bussing, aware. Per MD order: proceed with scheduled Adriamycin, Vincristine, Cytoxan, and Ruxience treatment today.

## 2020-12-07 NOTE — Patient Instructions (Signed)
[facility]   Discharge Instructions: Thank you for choosing [facility] to provide your oncology and hematology care.  If you have a lab appointment with the [facility], please go directly to the [facility] and check in at the registration area.  Wear comfortable clothing and clothing appropriate for easy access to any Portacath or PICC line.   We strive to give you quality time with your provider. You may need to reschedule your appointment if you arrive late (15 or more minutes).  Arriving late affects you and other patients whose appointments are after yours.  Also, if you miss three or more appointments without notifying the office, you may be dismissed from the clinic at the provider's discretion.      For prescription refill requests, have your pharmacy contact our office and allow 72 hours for refills to be completed.    Today you received the following chemotherapy and/or immunotherapy agents: Adriamycin, Vincristine, Cytoxan, Ruxience.      To help prevent nausea and vomiting after your treatment, we encourage you to take your nausea medication as directed.  BELOW ARE SYMPTOMS THAT SHOULD BE REPORTED IMMEDIATELY: *FEVER GREATER THAN 100.4 F (38 C) OR HIGHER *CHILLS OR SWEATING *NAUSEA AND VOMITING THAT IS NOT CONTROLLED WITH YOUR NAUSEA MEDICATION *UNUSUAL SHORTNESS OF BREATH *UNUSUAL BRUISING OR BLEEDING *URINARY PROBLEMS (pain or burning when urinating, or frequent urination) *BOWEL PROBLEMS (unusual diarrhea, constipation, pain near the anus) TENDERNESS IN MOUTH AND THROAT WITH OR WITHOUT PRESENCE OF ULCERS (sore throat, sores in mouth, or a toothache) UNUSUAL RASH, SWELLING OR PAIN  UNUSUAL VAGINAL DISCHARGE OR ITCHING   Items with * indicate a potential emergency and should be followed up as soon as possible or go to the Emergency Department if any problems should occur.  Please show the CHEMOTHERAPY ALERT CARD or  IMMUNOTHERAPY ALERT CARD at check-in to the Emergency Department and triage nurse.  Should you have questions after your visit or need to cancel or reschedule your appointment, please contact [facility]  [phone] and follow the prompts.  Office hours are 8:00 a.m. to 4:30 p.m. Monday - Friday. Please note that voicemails left after 4:00 p.m. may not be returned until the following business day.  We are closed weekends and major holidays. You have access to a nurse at all times for urgent questions. Please call the main number to the clinic [phone] and follow the prompts.  For any non-urgent questions, you may also contact your provider using MyChart. We now offer e-Visits for anyone 49 and older to request care online for non-urgent symptoms. For details visit [email or web address].   Also download the MyChart app! Go to the app store, search "MyChart", open the app, select [facility], and log in with your MyChart username and password.  Due to Covid, a mask is required upon entering the hospital/clinic. If you do not have a mask, one will be given to you upon arrival. For doctor visits, patients may have 1 support person aged 59 or older with them. For treatment visits, patients cannot have anyone with them due to current Covid guidelines and our immunocompromised population.

## 2020-12-09 ENCOUNTER — Encounter: Payer: Self-pay | Admitting: Internal Medicine

## 2020-12-09 NOTE — Progress Notes (Signed)
[place] NOTE  Patient Care Team: [name], MD as PCP - General (Internal Medicine)  CHIEF COMPLAINTS/PURPOSE OF CONSULTATION: Lymphoma Oncology History Overview Note   # Incidental- MAY 2022-CT scan chest abdomen pelvis consistent with bulky retroperitoneal adenopathy [6-8 cm]; subpectoral/axillary lymphadenopathy 2 to 2.5 cm; PET June 2022-bulky right axillary/subpectoral; bulky retroperitoneal adenopathy.  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  Repeat MRI 3-6 m   # coloscopy [2022; [facility]]; mammogram- nov 2021-WNL.   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 .  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  female newly diagnosed diffuse large B-cell lymphoma stage III/IV [bone marrow biopsy pending] is here to proceed with cycle #1 of R-CHOP chemotherapy.  Pending pathology patient underwent rituximab infusion x1 week ago.  Patient tolerated treatment fairly well.  Patient also underwent a repeat lymph node biopsy for FISH studies; bone marrow biopsy; and also for placement.  Notes to improvement of back pain not resolved.  Also notes improvement of the lymphadenopathy.  Denies any headaches or nausea vomiting.  Review of Systems  Constitutional:  Positive for malaise/fatigue and  weight loss. Negative for chills, diaphoresis and fever.  HENT:  Negative for nosebleeds and sore throat.   Eyes:  Negative for double vision.  Respiratory:  Negative for cough, hemoptysis, sputum production, shortness of breath and wheezing.   Cardiovascular:  Negative for chest pain, palpitations, orthopnea and leg swelling.  Gastrointestinal:  Positive for constipation. Negative for abdominal pain, blood in stool, diarrhea, heartburn, melena, nausea and vomiting.  Genitourinary:  Negative for dysuria, frequency and urgency.  Musculoskeletal:  Positive for back pain and joint pain.  Skin: Negative.  Negative for itching and rash.  Neurological:  Negative for dizziness, tingling, focal weakness, weakness and headaches.  Endo/Heme/Allergies:  Does not bruise/bleed easily.  Psychiatric/Behavioral:  Negative for depression. The patient is not nervous/anxious and does not have insomnia.     MEDICAL HISTORY:  Past Medical History:  Diagnosis Date   Colon polyps    Diverticulosis    Melanoma ([place]) 1988   lt upper arm, some lymph nodes removed   Mild intermittent asthma    Osteoporosis     SURGICAL HISTORY: Past Surgical History:  Procedure Laterality Date   BACK SURGERY     CATARACT EXTRACTION     IR IMAGING GUIDED PORT INSERTION  11/23/2020   TONSILLECTOMY     VAGINAL HYSTERECTOMY  1990    SOCIAL HISTORY: Social History   Socioeconomic History   Marital status: Married    Spouse name: Mikki Santee   Number of children: 2   Years of education: Not on file   Highest education level: Not on file  Occupational  History   Not on file  Tobacco Use   Smoking status: Never   Smokeless tobacco: Never  Substance and Sexual Activity   Alcohol use: Never   Drug use: Never   Sexual activity: Yes    Partners: Male  Other Topics Concern   Not on file  Social History Narrative   Lives at home with husband.    Social Determinants of Health   Financial Resource Strain: Not on file  Food  Insecurity: Not on file  Transportation Needs: Not on file  Physical Activity: Not on file  Stress: Not on file  Social Connections: Not on file  Intimate Partner Violence: Not on file    FAMILY HISTORY: Family History  Problem Relation Age of Onset   Breast cancer Neg Hx     ALLERGIES:  has No Known Allergies.  MEDICATIONS:  Current Outpatient Medications  Medication Sig Dispense Refill   acetaminophen (TYLENOL) 500 MG tablet Take 500 mg by mouth every 6 (six) hours as needed.     acyclovir (ZOVIRAX) 400 MG tablet Take 1 tablet (400 mg total) by mouth 2 (two) times daily. To prevent shingles 60 tablet 3   allopurinol (ZYLOPRIM) 300 MG tablet Take 1 tablet (300 mg total) by mouth 2 (two) times daily. To avoid gout flare up while on chemo 60 tablet 2   Cholecalciferol (VITAMIN D) 50 MCG (2000 UT) CAPS Take 1 capsule by mouth daily.     gabapentin (NEURONTIN) 100 MG capsule Take 100 mg by mouth at bedtime. Not sure of dose     lidocaine-prilocaine (EMLA) cream Apply to affected area once 30 g 3   Magnesium Oxide (MAG-OXIDE PO) Take 1 capsule by mouth daily at 12 noon.     meloxicam (MOBIC) 15 MG tablet Take 1 tablet by mouth daily.     methocarbamol (ROBAXIN) 500 MG tablet Take 500 mg by mouth in the morning and at bedtime.     predniSONE (DELTASONE) 50 MG tablet Take 100 mg by mouth daily with breakfast.     traMADol (ULTRAM) 50 MG tablet Take 1 tablet (50 mg total) by mouth every 12 (twelve) hours as needed for moderate pain. (Patient taking differently: Take 50 mg by mouth 6 (six) times daily.) 12 tablet 0   montelukast (SINGULAIR) 10 MG tablet Take 10 mg by mouth daily. Start 2 days prior to the infusion. Take for 4 days     ondansetron (ZOFRAN) 8 MG tablet Take 1 tablet (8 mg total) by mouth 2 (two) times daily as needed for refractory nausea / vomiting. Start on day 3 after cyclophosphamide chemotherapy. (Patient not taking: Reported on 12/07/2020) 30 tablet 1   prochlorperazine  (COMPAZINE) 10 MG tablet Take 1 tablet (10 mg total) by mouth every 6 (six) hours as needed (Nausea or vomiting). (Patient not taking: Reported on 12/07/2020) 30 tablet 6   No current facility-administered medications for this visit.      Marland Kitchen  PHYSICAL EXAMINATION: ECOG PERFORMANCE STATUS: 1 - Symptomatic but completely ambulatory  Vitals:   12/07/20 0830  BP: (!) 142/79  Pulse: (!) 114  Resp: 18  Temp: 97.8 F (36.6 C)   There were no vitals filed for this visit.   Physical Exam Constitutional:      Comments: Accompanied by husband.  In a wheelchair because of back pain.  Right axilla adenopathy.  Approximate 2 to 3 cm in size  HENT:     Head: Normocephalic and atraumatic.  Mouth/Throat:     Pharynx: No oropharyngeal exudate.  Eyes:     Pupils: Pupils are equal, round, and reactive to light.  Cardiovascular:     Rate and Rhythm: Normal rate and regular rhythm.  Pulmonary:     Effort: Pulmonary effort is normal. No respiratory distress.     Breath sounds: Normal breath sounds. No wheezing.  Abdominal:     General: Bowel sounds are normal. There is no distension.     Palpations: Abdomen is soft. There is no mass.     Tenderness: There is no abdominal tenderness. There is no guarding or rebound.  Musculoskeletal:        General: No tenderness. Normal range of motion.     Cervical back: Normal range of motion and neck supple.  Skin:    General: Skin is warm.  Neurological:     Mental Status: She is alert and oriented to person, place, and time.  Psychiatric:        Mood and Affect: Affect normal.     LABORATORY DATA:  I have reviewed the data as listed Lab Results  Component Value Date   WBC 19.9 (H) 12/07/2020   HGB 12.7 12/07/2020   HCT 39.2 12/07/2020   MCV 83.8 12/07/2020   PLT 500 (H) 12/07/2020   Recent Labs    09/27/20 1238 11/21/20 0850 12/07/20 0807  NA 131* 132* 133*  K 3.8 3.4* 3.0*  CL 90* 87* 92*  CO2 28 33* 31  GLUCOSE 112* 101* 160*   BUN _0 CREATININE 0.84 0.96 1.00  CALCIUM 9.9 10.3 8.9  GFRNONAA >60 >60 59*  PROT 7.3 7.0 6.3*  ALBUMIN 3.8 3.5 3.4*  AST _1 ALT _2 ALKPHOS 98 76 67  BILITOT 0.6 0.5 0.7    RADIOGRAPHIC STUDIES: I have personally reviewed the radiological images as listed and agreed with the findings in the report.   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  S/p right axilla lymph node biopsy-large B-cell lymphoma.  FISH panel-quantity not sufficient-s/p repeat lymph node/bone marrow biopsy-pending.  Currently s/p cycle #1 of Rituxan ONLY 1 week ago.  Tolerated well.   #Proceed with R-CHOP chemotherapy cycle #1 today. Labs today reviewed;  acceptable for treatment today.  Again reviewed the potential side effects including but not limited to nausea vomiting diarrhea hair loss risk of infections etc. MUGA scan [6/28]:Left ventricular ejection fraction equals  55.9 %.  Recommend Claritin.   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  Can consider intrathecal given the proximity of the nerve roots.  Await above work-up including bone marrow biopsy.  # Chemotherapy education; port placement. Hopefully the planned start chemotherapy next week. Antiemetics-Zofran and Compazine; EMLA cream sent to pharmacy.  # s/p COVID EVUSHELD PROPHYLAXIS/ vaccinated to [place].   #Gout/shingles prophylaxis- allopurinol/acyclovir ordered.  # RDEYCXKGYJE:[phone] [home] # chemo today  # Ziextenzo on 7/11 # in 10days- labs- cbc/bmp;possible IVFs # follow up July 29th MD; labs- cbc/cmp/LDH; R-CHOP chemo;8/01 Ziextenzo on- Dr.B         All questions were answered. The patient knows to call the clinic with any problems, questions or concerns.    [name], MD 12/09/2020 8:12 PM

## 2020-12-10 ENCOUNTER — Other Ambulatory Visit: Payer: Self-pay

## 2020-12-10 ENCOUNTER — Inpatient Hospital Stay: Payer: Medicare Other

## 2020-12-10 DIAGNOSIS — Z5111 Encounter for antineoplastic chemotherapy: Secondary | ICD-10-CM | POA: Diagnosis not present

## 2020-12-10 DIAGNOSIS — C8581 Other specified types of non-Hodgkin lymphoma, lymph nodes of head, face, and neck: Secondary | ICD-10-CM

## 2020-12-10 MED ORDER — PEGFILGRASTIM-BMEZ 6 MG/0.6ML ~~LOC~~ SOSY
6.0000 mg | PREFILLED_SYRINGE | Freq: Once | SUBCUTANEOUS | Status: AC
Start: 2020-12-10 — End: 2020-12-10
  Administered 2020-12-10: 6 mg via SUBCUTANEOUS
  Filled 2020-12-10: qty 0.6

## 2020-12-17 ENCOUNTER — Inpatient Hospital Stay: Payer: Medicare Other

## 2020-12-17 VITALS — BP 152/79 | HR 113 | Temp 97.2°F | Resp 18

## 2020-12-17 DIAGNOSIS — C8581 Other specified types of non-Hodgkin lymphoma, lymph nodes of head, face, and neck: Secondary | ICD-10-CM

## 2020-12-17 DIAGNOSIS — Z5111 Encounter for antineoplastic chemotherapy: Secondary | ICD-10-CM | POA: Diagnosis not present

## 2020-12-17 DIAGNOSIS — E860 Dehydration: Secondary | ICD-10-CM

## 2020-12-17 DIAGNOSIS — E876 Hypokalemia: Secondary | ICD-10-CM

## 2020-12-17 LAB — CBC WITH DIFFERENTIAL/PLATELET
Abs Immature Granulocytes: 1.74 10*3/uL — ABNORMAL HIGH (ref 0.00–0.07)
Basophils Absolute: 0 10*3/uL (ref 0.0–0.1)
Basophils Relative: 0 %
Eosinophils Absolute: 0.1 10*3/uL (ref 0.0–0.5)
Eosinophils Relative: 0 %
HCT: 36.9 % (ref 36.0–46.0)
Hemoglobin: 12 g/dL (ref 12.0–15.0)
Immature Granulocytes: 13 %
Lymphocytes Relative: 10 %
Lymphs Abs: 1.4 10*3/uL (ref 0.7–4.0)
MCH: 27.7 pg (ref 26.0–34.0)
MCHC: 32.5 g/dL (ref 30.0–36.0)
MCV: 85.2 fL (ref 80.0–100.0)
Monocytes Absolute: 1.2 10*3/uL — ABNORMAL HIGH (ref 0.1–1.0)
Monocytes Relative: 9 %
Neutro Abs: 9.5 10*3/uL — ABNORMAL HIGH (ref 1.7–7.7)
Neutrophils Relative %: 68 %
Platelets: 265 10*3/uL (ref 150–400)
RBC: 4.33 MIL/uL (ref 3.87–5.11)
RDW: 17.2 % — ABNORMAL HIGH (ref 11.5–15.5)
Smear Review: NORMAL
WBC: 13.9 10*3/uL — ABNORMAL HIGH (ref 4.0–10.5)
nRBC: 1.1 % — ABNORMAL HIGH (ref 0.0–0.2)

## 2020-12-17 LAB — BASIC METABOLIC PANEL
Anion gap: 11 (ref 5–15)
BUN: 10 mg/dL (ref 8–23)
CO2: 28 mmol/L (ref 22–32)
Calcium: 9 mg/dL (ref 8.9–10.3)
Chloride: 91 mmol/L — ABNORMAL LOW (ref 98–111)
Creatinine, Ser: 0.83 mg/dL (ref 0.44–1.00)
GFR, Estimated: >60 mL/min (ref >60)
Glucose, Bld: 142 mg/dL — ABNORMAL HIGH (ref 70–99)
Potassium: 2.7 mmol/L — CL (ref 3.5–5.1)
Sodium: 130 mmol/L — ABNORMAL LOW (ref 135–145)

## 2020-12-17 MED ORDER — HEPARIN SOD (PORK) LOCK FLUSH 100 UNIT/ML IV SOLN
500.0000 [IU] | Freq: Once | INTRAVENOUS | Status: AC
  Administered 2020-12-17: 500 [IU] via INTRAVENOUS
  Filled 2020-12-17: qty 5

## 2020-12-17 MED ORDER — SODIUM CHLORIDE 0.9 % IV SOLN
Freq: Once | INTRAVENOUS | Status: AC
Start: 2020-12-17 — End: 2020-12-17
  Filled 2020-12-17: qty 250

## 2020-12-17 MED ORDER — SODIUM CHLORIDE 0.9% FLUSH
10.0000 mL | INTRAVENOUS | Status: DC | PRN
  Administered 2020-12-17: 10 mL via INTRAVENOUS
  Filled 2020-12-17: qty 10

## 2020-12-17 MED ORDER — POTASSIUM CHLORIDE 20 MEQ/100ML IV SOLN
20.0000 meq | Freq: Once | INTRAVENOUS | Status: AC
  Administered 2020-12-17: 20 meq via INTRAVENOUS

## 2020-12-17 NOTE — Progress Notes (Signed)
Pt here for IV hydration. Potassium is low. MD ordered 20 KCL IV for potassium of 2.7. Ambulated to restroom holding unto pump. Sciatica pain is still present in left leg but less intensive. Pt states she did well with chemo except she had a weak spell the day after her pegfilgrastim injection.

## 2020-12-17 NOTE — Patient Instructions (Signed)
Potassium chloride injection What is this medication? POTASSIUM CHLORIDE (poe TASS i um KLOOR ide) is a potassium supplement used to prevent and to treat low potassium. Potassium is important for the heart, muscles, and nerves. Too much or too little potassium in the body can causeserious problems. This medicine may be used for other purposes; ask your health care provider orpharmacist if you have questions. COMMON BRAND NAME(S): PROAMP What should I tell my care team before I take this medication? They need to know if you have any of these conditions: Addison disease dehydration diabetes (high blood sugar) heart disease high levels of potassium in the blood irregular heartbeat or rhythm kidney disease large areas of burned skin an unusual or allergic reaction to potassium, other medicines, foods, dyes, or preservatives pregnant or trying to get pregnant breast-feeding How should I use this medication? This medicine is injected into a vein. It is given by a health care provider ina hospital or clinic setting. Talk to your health care provider about the use of this medicine in children.Special care may be needed. Overdosage: If you think you have taken too much of this medicine contact apoison control center or emergency room at once. NOTE: This medicine is only for you. Do not share this medicine with others. What if I miss a dose? This does not apply. This medicine is not for regular use. What may interact with this medication? Do not take this medicine with any of the following medications: certain diuretics such as spironolactone, triamterene eplerenone sodium polystyrene sulfonate This medicine may also interact with the following medications: certain medicines for blood pressure or heart disease like lisinopril, losartan, quinapril, valsartan medicines that lower your chance of fighting infection such as cyclosporine, tacrolimus NSAIDs, medicines for pain and inflammation, like  ibuprofen or naproxen other potassium supplements salt substitutes This list may not describe all possible interactions. Give your health care provider a list of all the medicines, herbs, non-prescription drugs, or dietary supplements you use. Also tell them if you smoke, drink alcohol, or use illegaldrugs. Some items may interact with your medicine. What should I watch for while using this medication? Visit your health care provider for regular checks on your progress. Tell your health care provider if your symptoms do not start to get better or if they getworse. You may need blood work while you are taking this medicine. Avoid salt substitutes unless you are told otherwise by your health careprovider. What side effects may I notice from receiving this medication? Side effects that you should report to your doctor or health care professionalas soon as possible: allergic reactions (skin rash, itching, hives, swelling of the face, lips, tongue, or throat) confusion high potassium levels (muscle weakness, fast or irregular heartbeat) low blood pressure (dizziness, feeling faint or lightheaded, blurry vision) pain, tingling, or numbness in lips, hands, or feet pain, redness, or irritation at site where injected trouble breathing Side effects that usually do not require medical attention (report to yourdoctor or health care professional if they continue or are bothersome): diarrhea nausea, vomiting passing gas stomach pain This list may not describe all possible side effects. Call your doctor for medical advice about side effects. You may report side effects to FDA at1-800-FDA-1088. Where should I keep my medication? This medicine is given in a hospital or clinic. It will not be stored at home. NOTE: This sheet is a summary. It may not cover all possible information. If you have questions about this medicine, talk to your doctor, pharmacist,   orhealth care provider.  2022 Elsevier/Gold Standard  (2019-03-17 18:18:09)  

## 2020-12-24 ENCOUNTER — Encounter: Payer: Self-pay | Admitting: Internal Medicine

## 2020-12-28 ENCOUNTER — Encounter: Payer: Self-pay | Admitting: Internal Medicine

## 2020-12-28 ENCOUNTER — Inpatient Hospital Stay: Payer: Medicare Other | Admitting: Internal Medicine

## 2020-12-28 ENCOUNTER — Inpatient Hospital Stay: Payer: Medicare Other

## 2020-12-28 VITALS — BP 136/70 | HR 66 | Temp 99.5°F | Resp 16 | Ht 62.0 in | Wt 118.2 lb

## 2020-12-28 DIAGNOSIS — C8581 Other specified types of non-Hodgkin lymphoma, lymph nodes of head, face, and neck: Secondary | ICD-10-CM

## 2020-12-28 DIAGNOSIS — Z5111 Encounter for antineoplastic chemotherapy: Secondary | ICD-10-CM | POA: Diagnosis not present

## 2020-12-28 LAB — CBC WITH DIFFERENTIAL/PLATELET
Abs Immature Granulocytes: 0.65 10*3/uL — ABNORMAL HIGH (ref 0.00–0.07)
Basophils Absolute: 0.2 10*3/uL — ABNORMAL HIGH (ref 0.0–0.1)
Basophils Relative: 1 %
Eosinophils Absolute: 0.1 10*3/uL (ref 0.0–0.5)
Eosinophils Relative: 1 %
HCT: 38.4 % (ref 36.0–46.0)
Hemoglobin: 12.3 g/dL (ref 12.0–15.0)
Immature Granulocytes: 4 %
Lymphocytes Relative: 5 %
Lymphs Abs: 0.8 10*3/uL (ref 0.7–4.0)
MCH: 27.9 pg (ref 26.0–34.0)
MCHC: 32 g/dL (ref 30.0–36.0)
MCV: 87.1 fL (ref 80.0–100.0)
Monocytes Absolute: 1.2 10*3/uL — ABNORMAL HIGH (ref 0.1–1.0)
Monocytes Relative: 7 %
Neutro Abs: 14.8 10*3/uL — ABNORMAL HIGH (ref 1.7–7.7)
Neutrophils Relative %: 82 %
Platelets: 404 10*3/uL — ABNORMAL HIGH (ref 150–400)
RBC: 4.41 MIL/uL (ref 3.87–5.11)
RDW: 20 % — ABNORMAL HIGH (ref 11.5–15.5)
WBC: 17.7 10*3/uL — ABNORMAL HIGH (ref 4.0–10.5)
nRBC: 0.2 % (ref 0.0–0.2)

## 2020-12-28 LAB — COMPREHENSIVE METABOLIC PANEL
ALT: 20 U/L (ref 0–44)
AST: 23 U/L (ref 15–41)
Albumin: 3.5 g/dL (ref 3.5–5.0)
Alkaline Phosphatase: 65 U/L (ref 38–126)
Anion gap: 11 (ref 5–15)
BUN: 14 mg/dL (ref 8–23)
CO2: 28 mmol/L (ref 22–32)
Calcium: 8.9 mg/dL (ref 8.9–10.3)
Chloride: 92 mmol/L — ABNORMAL LOW (ref 98–111)
Creatinine, Ser: 0.82 mg/dL (ref 0.44–1.00)
GFR, Estimated: >60 mL/min (ref >60)
Glucose, Bld: 148 mg/dL — ABNORMAL HIGH (ref 70–99)
Potassium: 3.4 mmol/L — ABNORMAL LOW (ref 3.5–5.1)
Sodium: 131 mmol/L — ABNORMAL LOW (ref 135–145)
Total Bilirubin: 0.3 mg/dL (ref 0.3–1.2)
Total Protein: 5.9 g/dL — ABNORMAL LOW (ref 6.5–8.1)

## 2020-12-28 LAB — LACTATE DEHYDROGENASE: LDH: 180 U/L (ref 98–192)

## 2020-12-28 MED ORDER — PREDNISONE 50 MG PO TABS: 100.0000 mg | ORAL_TABLET | Freq: Every day | ORAL | 4 refills | Status: DC

## 2020-12-28 MED ORDER — DOXORUBICIN HCL CHEMO IV INJECTION 2 MG/ML
50.0000 mg/m2 | Freq: Once | INTRAVENOUS | Status: AC
  Administered 2020-12-28: 78 mg via INTRAVENOUS
  Filled 2020-12-28: qty 25

## 2020-12-28 MED ORDER — VINCRISTINE SULFATE CHEMO INJECTION 1 MG/ML
2.0000 mg | Freq: Once | INTRAVENOUS | Status: AC
  Administered 2020-12-28: 2 mg via INTRAVENOUS
  Filled 2020-12-28: qty 2

## 2020-12-28 MED ORDER — RITUXIMAB-PVVR CHEMO 500 MG/50ML IV SOLN
375.0000 mg/m2 | Freq: Once | INTRAVENOUS | Status: AC
  Administered 2020-12-28: 600 mg via INTRAVENOUS
  Filled 2020-12-28: qty 50

## 2020-12-28 MED ORDER — PALONOSETRON HCL INJECTION 0.25 MG/5ML
0.2500 mg | Freq: Once | INTRAVENOUS | Status: AC
  Administered 2020-12-28: 0.25 mg via INTRAVENOUS
  Filled 2020-12-28: qty 5

## 2020-12-28 MED ORDER — DEXAMETHASONE SODIUM PHOSPHATE 100 MG/10ML IJ SOLN
10.0000 mg | Freq: Once | INTRAMUSCULAR | Status: AC
  Administered 2020-12-28: 10 mg via INTRAVENOUS
  Filled 2020-12-28: qty 10

## 2020-12-28 MED ORDER — SODIUM CHLORIDE 0.9 % IV SOLN
750.0000 mg/m2 | Freq: Once | INTRAVENOUS | Status: AC
  Administered 2020-12-28: 1160 mg via INTRAVENOUS
  Filled 2020-12-28: qty 50

## 2020-12-28 MED ORDER — HEPARIN SOD (PORK) LOCK FLUSH 100 UNIT/ML IV SOLN
500.0000 [IU] | Freq: Once | INTRAVENOUS | Status: AC | PRN
  Administered 2020-12-28: 500 [IU]
  Filled 2020-12-28: qty 5

## 2020-12-28 MED ORDER — DIPHENHYDRAMINE HCL 25 MG PO CAPS
50.0000 mg | ORAL_CAPSULE | Freq: Once | ORAL | Status: AC
  Administered 2020-12-28: 50 mg via ORAL
  Filled 2020-12-28: qty 2

## 2020-12-28 MED ORDER — SODIUM CHLORIDE 0.9 % IV SOLN
150.0000 mg | Freq: Once | INTRAVENOUS | Status: AC
  Administered 2020-12-28: 150 mg via INTRAVENOUS
  Filled 2020-12-28: qty 150

## 2020-12-28 MED ORDER — HEPARIN SOD (PORK) LOCK FLUSH 100 UNIT/ML IV SOLN
INTRAVENOUS | Status: AC
  Filled 2020-12-28: qty 5

## 2020-12-28 MED ORDER — SODIUM CHLORIDE 0.9 % IV SOLN
Freq: Once | INTRAVENOUS | Status: AC
  Filled 2020-12-28: qty 250

## 2020-12-28 MED ORDER — ACETAMINOPHEN 325 MG PO TABS
650.0000 mg | ORAL_TABLET | Freq: Once | ORAL | Status: AC
  Administered 2020-12-28: 650 mg via ORAL
  Filled 2020-12-28: qty 2

## 2020-12-28 NOTE — Assessment & Plan Note (Addendum)
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  S/p right axilla lymph node biopsy-large B-cell lymphoma. REPAT Bx- show Bcl-2/cMYC by IHC. FISH studies (MYC break apart and MYC/IGH fusion probes) are negative  for MYC rearrangement.  July 2022 bone marrow biopsy negative currently s/p cycle #1 of Rituxan' # 1-RCHOP  Tolerated well.  MUGA scan [6/28]:Left ventricular ejection fraction equals  55.9 %.   #Proceed with R-CHOP chemotherapy cycle #2 today. Labs today reviewed;  acceptable for treatment today.    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  Improved.  Consider intrathecal given the proximity of the nerve roots.  Await repeat PET scan.  # Hypokalemia: sec to chemo- s/p KCL-today potassium is 3.4.  Monitor for now.  Dietary supplementation.  # s/p COVID EVUSHELD PROPHYLAXIS/ vaccinated to [place].   #Gout/shingles prophylaxis- allopurinol/acyclovir ordered.  [phone] [home]  # DISPOSITION: # chemo today  # Ziextenzo on 8/01.  # in 10days- labs- cbc/bmp;possible IVFs/possible KCL # follow up AUG 19th h MD; labs- cbc/cmp/LDH; R-CHOP; possible KCL 20 meq;8/22 Ziextenzo; PET scan prior Dr.B

## 2020-12-28 NOTE — Patient Instructions (Addendum)
[facility]    Discharge Instructions: Thank you for choosing [facility] to provide your oncology and hematology care.  If you have a lab appointment with the [facility], please go directly to the [facility] and check in at the registration area.  Wear comfortable clothing and clothing appropriate for easy access to any Portacath or PICC line.   We strive to give you quality time with your provider. You may need to reschedule your appointment if you arrive late (15 or more minutes).  Arriving late affects you and other patients whose appointments are after yours.  Also, if you miss three or more appointments without notifying the office, you may be dismissed from the clinic at the provider's discretion.      For prescription refill requests, have your pharmacy contact our office and allow 72 hours for refills to be completed.    Today you received the following chemotherapy and/or immunotherapy agents - Doxorubicin, Vincristine, Cytoxan, Ruxience      To help prevent nausea and vomiting after your treatment, we encourage you to take your nausea medication as directed.  BELOW ARE SYMPTOMS THAT SHOULD BE REPORTED IMMEDIATELY: *FEVER GREATER THAN 100.4 F (38 C) OR HIGHER *CHILLS OR SWEATING *NAUSEA AND VOMITING THAT IS NOT CONTROLLED WITH YOUR NAUSEA MEDICATION *UNUSUAL SHORTNESS OF BREATH *UNUSUAL BRUISING OR BLEEDING *URINARY PROBLEMS (pain or burning when urinating, or frequent urination) *BOWEL PROBLEMS (unusual diarrhea, constipation, pain near the anus) TENDERNESS IN MOUTH AND THROAT WITH OR WITHOUT PRESENCE OF ULCERS (sore throat, sores in mouth, or a toothache) UNUSUAL RASH, SWELLING OR PAIN  UNUSUAL VAGINAL DISCHARGE OR ITCHING   Items with * indicate a potential emergency and should be followed up as soon as possible or go to the Emergency Department if any problems should occur.  Please show the CHEMOTHERAPY ALERT CARD  or IMMUNOTHERAPY ALERT CARD at check-in to the Emergency Department and triage nurse.  Should you have questions after your visit or need to cancel or reschedule your appointment, please contact [facility]  [phone] and follow the prompts.  Office hours are 8:00 a.m. to 4:30 p.m. Monday - Friday. Please note that voicemails left after 4:00 p.m. may not be returned until the following business day.  We are closed weekends and major holidays. You have access to a nurse at all times for urgent questions. Please call the main number to the clinic [phone] and follow the prompts.  For any non-urgent questions, you may also contact your provider using MyChart. We now offer e-Visits for anyone 37 and older to request care online for non-urgent symptoms. For details visit [email or web address].   Also download the MyChart app! Go to the app store, search "MyChart", open the app, select [facility], and log in with your MyChart username and password.  Due to Covid, a mask is required upon entering the hospital/clinic. If you do not have a mask, one will be given to you upon arrival. For doctor visits, patients may have 1 support person aged 35 or older with them. For treatment visits, patients cannot have anyone with them due to current Covid guidelines and our immunocompromised population.   Palonosetron Injection What is this medication? PALONOSETRON (pal oh NOE se tron) is used to prevent nausea and vomiting caused by chemotherapy. It also helps prevent delayed nausea and vomiting that mayoccur a few days after your treatment. This medicine may be used for other purposes; ask your health  care provider orpharmacist if you have questions. COMMON BRAND NAME(S): Aloxi What should I tell my care team before I take this medication? They need to know if you have any of these conditions: an unusual or allergic reaction to palonosetron, dolasetron, granisetron,  ondansetron, other medicines, foods, dyes, or preservatives pregnant or trying to get pregnant breast-feeding How should I use this medication? This medicine is for infusion into a vein. It is given by a health careprofessional in a hospital or clinic setting. Talk to your pediatrician regarding the use of this medicine in children. While this drug may be prescribed for children as young as 1 month for selectedconditions, precautions do apply. Overdosage: If you think you have taken too much of this medicine contact apoison control center or emergency room at once. NOTE: This medicine is only for you. Do not share this medicine with others. What if I miss a dose? This does not apply. What may interact with this medication? certain medicines for depression, anxiety, or psychotic disturbances fentanyl linezolid MAOIs like Carbex, Eldepryl, Marplan, Nardil, and Parnate methylene blue (injected into a vein) tramadol This list may not describe all possible interactions. Give your health care provider a list of all the medicines, herbs, non-prescription drugs, or dietary supplements you use. Also tell them if you smoke, drink alcohol, or use illegaldrugs. Some items may interact with your medicine. What should I watch for while using this medication? Your condition will be monitored carefully while you are receiving thismedicine. What side effects may I notice from receiving this medication? Side effects that you should report to your doctor or health care professionalas soon as possible: allergic reactions like skin rash, itching or hives, swelling of the face, lips, or tongue breathing problems confusion dizziness fast, irregular heartbeat fever and chills loss of balance or coordination seizures sweating swelling of the hands and feet tremors unusually weak or tired Side effects that usually do not require medical attention (report to yourdoctor or health care professional if they  continue or are bothersome): constipation or diarrhea headache This list may not describe all possible side effects. Call your doctor for medical advice about side effects. You may report side effects to FDA at1-800-FDA-1088. Where should I keep my medication? This drug is given in a hospital or clinic and will not be stored at home. NOTE: This sheet is a summary. It may not cover all possible information. If you have questions about this medicine, talk to your doctor, pharmacist, orhealth care provider.  2022 Elsevier/Gold Standard (2013-03-25 10:38:36)  Acetaminophen Capsules or Tablets What is this medication? ACETAMINOPHEN (a set a MEE noe fen) treats mild to moderate pain. It may alsobe used to reduce fever. This medicine may be used for other purposes; ask your health care provider orpharmacist if you have questions. COMMON BRAND NAME(S): Aceta, Actamin, Anacin Aspirin Free, Genapap, Genebs, Mapap, Pain & Fever, Pain and Fever, PAIN RELIEF, PAIN RELIEF Extra Strength, Pain Reliever, Panadol, PHARBETOL, Plus PHARMA, Q-Pap, Q-Pap Extra Strength, Tylenol, Tylenol CrushableTablet, Tylenol Extra Strength, Tylenol RegularStrength, XS No Aspirin, XS Pain Reliever What should I tell my care team before I take this medication? They need to know if you have any of these conditions: If you often drink alcohol Liver disease An unusual or allergic reaction to acetaminophen, other medications, foods, dyes, or preservatives Pregnant or trying to get pregnant Breast-feeding How should I use this medication? Take this medication by mouth with a glass of water. Follow the directions on the package or  prescription label. Take your medication at regular intervals.Do not take your medication more often than directed. Talk to your care team about the use of this medication in children. While this medication may be prescribed for children as young as 2 years of age forselected conditions, precautions do  apply. Overdosage: If you think you have taken too much of this medicine contact apoison control center or emergency room at once. NOTE: This medicine is only for you. Do not share this medicine with others. What if I miss a dose? If you miss a dose, take it as soon as you can. If it is almost time for yournext dose, take only that dose. Do not take double or extra doses. What may interact with this medication? Alcohol Imatinib Isoniazid Other medications with acetaminophen This list may not describe all possible interactions. Give your health care provider a list of all the medicines, herbs, non-prescription drugs, or dietary supplements you use. Also tell them if you smoke, drink alcohol, or use illegaldrugs. Some items may interact with your medicine. What should I watch for while using this medication? Tell your care team if the pain lasts more than 10 days (5 days for children), if it gets worse, or if there is a new or different kind of pain. Also, checkwith your care team if a fever lasts for more than 3 days. Do not take other medications that contain acetaminophen with this one. Alwaysread labels carefully. If you have questions, ask your care team. If you take too much acetaminophen, get medical help right away. Too much acetaminophen can be very dangerous and cause liver damage. Even if you do nothave symptoms, it is important to get help right away. What side effects may I notice from receiving this medication? Side effects that you should report to your care team as soon as possible: Allergic reactions-skin rash, itching, hives, swelling of the face, lips, tongue, or throat Liver injury-right upper belly pain, loss of appetite, nausea, light-colored stool, dark yellow or brown urine, yellowing skin or eyes, unusual weakness or fatigue Redness, blistering, peeling, or loosening of the skin, including inside the mouth Side effects that usually do not require medical attention (report  to your careteam if they continue or are bothersome): Headache Nausea Trouble sleeping Upset stomach This list may not describe all possible side effects. Call your doctor for medical advice about side effects. You may report side effects to FDA at1-800-FDA-1088. Where should I keep my medication? Keep out of reach of children and pets. Store at room temperature between 20 and 25 degrees C (68 and 77 degrees F). Protect from moisture and heat. Throw away any unused medication after theexpiration date. NOTE: This sheet is a summary. It may not cover all possible information. If you have questions about this medicine, talk to your doctor, pharmacist, orhealth care provider.  2022 Elsevier/Gold Standard (2020-04-03 11:07:55)  Diphenhydramine Capsules or Tablets What is this medication? DIPHENHYDRAMINE (dye fen HYE dra meen) treats the symptoms of allergies and allergic reactions. It may also be used to prevent and treat motion sickness or symptoms of Parkinson disease. It works by blocking histamine, a substance released by the body during an allergic reaction. It belongs to a group ofmedications called antihistamines. This medicine may be used for other purposes; ask your health care provider orpharmacist if you have questions. COMMON BRAND NAME(S): Alka-Seltzer Plus Allergy, Aller-G-Time, Banophen, Benadryl Allergy, Benadryl Allergy Dye Free, Benadryl Allergy Kapgel, Benadryl Allergy Ultratab, Diphedryl, Diphenhist, Genahist, Geri-Dryl, PHARBEDRYL,Q-Dryl, Sleepinal, Valu-Dryl,  Vicks ZzzQuil Nightime Sleep-Aid What should I tell my care team before I take this medication? They need to know if you have any of these conditions: Asthma or lung disease Glaucoma High blood pressure or heart disease Liver disease Pain or difficulty passing urine Prostate trouble Ulcers or other stomach problems An unusual or allergic reaction to diphenhydramine, other medications foods, dyes, or preservatives such  as sulfites Pregnant or trying to get pregnant Breast-feeding How should I use this medication? Take this medication by mouth with a full glass of water. Follow the directions on the prescription label. Take your doses at regular intervals. Do not take your medication more often than directed. To prevent motion sickness starttaking this medication 30 to 60 minutes before you leave. Talk to your care team regarding the use of this medication in children.Special care may be needed. Patients over 40 years old may have a stronger reaction and need a smaller dose. Overdosage: If you think you have taken too much of this medicine contact apoison control center or emergency room at once. NOTE: This medicine is only for you. Do not share this medicine with others. What if I miss a dose? If you miss a dose, take it as soon as you can. If it is almost time for yournext dose, take only that dose. Do not take double or extra doses. What may interact with this medication? Do not take this medication with any of the following: MAOIs like Carbex, Eldepryl, Marplan, Nardil, and Parnate This medication may also interact with the following: Alcohol Barbiturates, like phenobarbital Medications for bladder spasm like oxybutynin, tolterodine Medications for blood pressure Medications for depression, anxiety, or psychotic disturbances Medications for movement abnormalities or Parkinson's disease Medications for sleep Other medications for cold, cough or allergy Some medications for the stomach like chlordiazepoxide, dicyclomine This list may not describe all possible interactions. Give your health care provider a list of all the medicines, herbs, non-prescription drugs, or dietary supplements you use. Also tell them if you smoke, drink alcohol, or use illegaldrugs. Some items may interact with your medicine. What should I watch for while using this medication? Visit your care team for regular check-ups. Tell  your care team if yoursymptoms do not improve or if they get worse. Your mouth may get dry. Chewing sugarless gum or sucking hard candy, and drinking plenty of water may help. Contact your care team if the problem doesnot go away or is severe. This medication may cause dry eyes and blurred vision. If you wear contact lenses you may feel some discomfort. Lubricating drops may help. See your eyedoctor if the problem does not go away or is severe. You may get drowsy or dizzy. Do not drive, use machinery, or do anything that needs mental alertness until you know how this medication affects you. Do not stand or sit up quickly, especially if you are an older patient. This reduces the risk of dizzy or fainting spells. Alcohol may interfere with the effect ofthis medication. Avoid alcoholic drinks. What side effects may I notice from receiving this medication? Side effects that you should report to your care team as soon as possible: Allergic reactions-skin rash, itching, hives, swelling of the face, lips, tongue, or throat Sudden eye pain or change in vision such as blurry vision, seeing halos around lights, vision loss Trouble passing urine Side effects that usually do not require medical attention (report to your careteam if they continue or are bothersome): Constipation Drowsiness Dry mouth Headache Upset stomach This  list may not describe all possible side effects. Call your doctor for medical advice about side effects. You may report side effects to FDA at1-800-FDA-1088. Where should I keep my medication? Keep out of the reach of children and pets. This medication can be abused. Keepyour medication in a safe place. Store at room temperature between 15 and 30 degrees C (59 and 86 degrees F). Keep container closed tightly. Throw away any unused medication after theexpiration date. NOTE: This sheet is a summary. It may not cover all possible information. If you have questions about this medicine, talk  to your doctor, pharmacist, orhealth care provider.  2022 Elsevier/Gold Standard (2020-06-15 11:53:52)  Dexamethasone injection What is this medication? DEXAMETHASONE (dex a METH a sone) is a corticosteroid. It is used to treat inflammation of the skin, joints, lungs, and other organs. Common conditions treated include asthma, allergies, and arthritis. It is also used for otherconditions, like blood disorders and diseases of the adrenal glands. This medicine may be used for other purposes; ask your health care provider orpharmacist if you have questions. COMMON BRAND NAME(S): Decadron, DoubleDex, ReadySharp Dexamethasone, SimplistDexamethasone, Solurex What should I tell my care team before I take this medication? They need to know if you have any of these conditions: Cushing's syndrome diabetes glaucoma heart disease high blood pressure infection like herpes, measles, tuberculosis, or chickenpox kidney disease liver disease mental illness myasthenia gravis osteoporosis previous heart attack seizures stomach or intestine problems thyroid disease an unusual or allergic reaction to dexamethasone, corticosteroids, other medicines, lactose, foods, dyes, or preservatives pregnant or trying to get pregnant breast-feeding How should I use this medication? This medicine is for injection into a muscle, joint, lesion, soft tissue, orvein. It is given by a health care professional in a hospital or clinic setting. Talk to your pediatrician regarding the use of this medicine in children.Special care may be needed. Overdosage: If you think you have taken too much of this medicine contact apoison control center or emergency room at once. NOTE: This medicine is only for you. Do not share this medicine with others. What if I miss a dose? This may not apply. If you are having a series of injections over a prolonged period, try not to miss an appointment. Call your doctor or health careprofessional  to reschedule if you are unable to keep an appointment. What may interact with this medication? Do not take this medicine with any of the following medications: live virus vaccines This medicine may also interact with the following medications: aminoglutethimide amphotericin B aspirin and aspirin-like medicines certain antibiotics like erythromycin, clarithromycin, and troleandomycin certain antivirals for HIV or hepatitis certain medicines for seizures like carbamazepine, phenobarbital, phenytoin certain medicines to treat myasthenia gravis cholestyramine cyclosporine digoxin diuretics ephedrine female hormones, like estrogen or progestins and birth control pills insulin or other medicines for diabetes isoniazid ketoconazole medicines that relax muscles for surgery mifepristone NSAIDs, medicines for pain and inflammation, like ibuprofen or naproxen rifampin skin tests for allergies thalidomide vaccines warfarin This list may not describe all possible interactions. Give your health care provider a list of all the medicines, herbs, non-prescription drugs, or dietary supplements you use. Also tell them if you smoke, drink alcohol, or use illegaldrugs. Some items may interact with your medicine. What should I watch for while using this medication? Visit your health care professional for regular checks on your progress. Tell your health care professional if your symptoms do not start to get better or if they get worse. Your condition will  be monitored carefully while you arereceiving this medicine. Wear a medical ID bracelet or chain. Carry a card that describes your diseaseand details of your medicine and dosage times. This medicine may increase your risk of getting an infection. Call your health care professional for advice if you get a fever, chills, or sore throat, or other symptoms of a cold or flu. Do not treat yourself. Try to avoid being around people who are sick. Call your  health care professional if you are around anyone with measles, chickenpox, or if you develop sores or blistersthat do not heal properly. If you are going to need surgery or other procedures, tell your doctor or health care professional that you have taken this medicine within the last 46month. Ask your doctor or health care professional about your diet. You may need tolower the amount of salt you eat. This medicine may increase blood sugar. Ask your healthcare provider if changesin diet or medicines are needed if you have diabetes. What side effects may I notice from receiving this medication? Side effects that you should report to your doctor or health care professionalas soon as possible: allergic reactions like skin rash, itching or hives, swelling of the face, lips, or tongue bloody or black, tarry stools changes in emotions or moods changes in vision confusion, excitement, restlessness depressed mood eye pain hallucinations muscle weakness severe or sudden stomach or belly pain signs and symptoms of high blood sugar such as being more thirsty or hungry or having to urinate more than normal. You may also feel very tired or have blurry vision. signs and symptoms of infection like fever; chills; cough; sore throat; pain or trouble passing urine swelling of ankles, feet unusual bruising or bleeding wounds that do not heal Side effects that usually do not require medical attention (report to yourdoctor or health care professional if they continue or are bothersome): increased appetite increased growth of face or body hair headache nausea, vomiting pain, redness, or irritation at site where injected skin problems, acne, thin and shiny skin trouble sleeping weight gain This list may not describe all possible side effects. Call your doctor for medical advice about side effects. You may report side effects to FDA at1-800-FDA-1088. Where should I keep my medication? This medicine is  given in a hospital or clinic and will not be stored at home. NOTE: This sheet is a summary. It may not cover all possible information. If you have questions about this medicine, talk to your doctor, pharmacist, orhealth care provider.  2022 Elsevier/Gold Standard (2018-11-30 13:51:58)  Fosaprepitant injection What is this medication? FOSAPREPITANT (fos ap RE pi tant) is used together with other medicines toprevent nausea and vomiting caused by cancer treatment (chemotherapy). This medicine may be used for other purposes; ask your health care provider orpharmacist if you have questions. COMMON BRAND NAME(S): Emend What should I tell my care team before I take this medication? They need to know if you have any of these conditions: liver disease an unusual or allergic reaction to fosaprepitant, aprepitant, medicines, foods, dyes, or preservatives pregnant or trying to get pregnant breast-feeding How should I use this medication? This medicine is for injection into a vein. It is given by a health careprofessional in a hospital or clinic setting. Talk to your pediatrician regarding the use of this medicine in children. While this drug may be prescribed for children as young as 6 months for selectedconditions, precautions do apply. Overdosage: If you think you have taken too much of this medicine  contact apoison control center or emergency room at once. NOTE: This medicine is only for you. Do not share this medicine with others. What if I miss a dose? This does not apply. What may interact with this medication? Do not take this medicine with any of these medicines: cisapride flibanserin lomitapide pimozide This medicine may also interact with the following medications: diltiazem female hormones, like estrogens or progestins and birth control pills medicines for fungal infections like ketoconazole and itraconazole medicines for HIV medicines for seizures or to control epilepsy like  carbamazepine or phenytoin medicines used for sleep or anxiety disorders like alprazolam, diazepam, or midazolam nefazodone paroxetine ranolazine rifampin some chemotherapy medications like etoposide, ifosfamide, vinblastine, vincristine some antibiotics like clarithromycin, erythromycin, troleandomycin steroid medicines like dexamethasone or methylprednisolone tolbutamide warfarin This list may not describe all possible interactions. Give your health care provider a list of all the medicines, herbs, non-prescription drugs, or dietary supplements you use. Also tell them if you smoke, drink alcohol, or use illegaldrugs. Some items may interact with your medicine. What should I watch for while using this medication? Do not take this medicine if you already have nausea and vomiting. Ask yourhealth care provider what to do if you already have nausea. Birth control pills and other methods of hormonal contraception (for example, IUD or patch) may not work properly while you are taking this medicine. Use an extra method of birth control during treatment and for 1 month after your lastdose of fosaprepitant. This medicine should not be used continuously for a long time. Visit your doctor or health care professional for regular check-ups. Thismedicine may change your liver function blood test results. What side effects may I notice from receiving this medication? Side effects that you should report to your doctor or health care professionalas soon as possible: allergic reactions like skin rash, itching or hives, swelling of the face, lips, or tongue breathing problems changes in heart rhythm high or low blood pressure pain, redness, or irritation at site where injected rectal bleeding serious dizziness or disorientation, confusion sharp or severe stomach pain sharp pain in your leg Side effects that usually do not require medical attention (report to yourdoctor or health care professional if they  continue or are bothersome): constipation or diarrhea hair loss headache hiccups loss of appetite nausea upset stomach tiredness This list may not describe all possible side effects. Call your doctor for medical advice about side effects. You may report side effects to FDA at1-800-FDA-1088. Where should I keep my medication? This drug is given in a hospital or clinic and will not be stored at home. NOTE: This sheet is a summary. It may not cover all possible information. If you have questions about this medicine, talk to your doctor, pharmacist, orhealth care provider.  2022 Elsevier/Gold Standard (2016-09-04 12:55:48)   Doxorubicin injection What is this medication? DOXORUBICIN (dox oh ROO bi sin) is a chemotherapy drug. It is used to treat many kinds of cancer like leukemia, lymphoma, neuroblastoma, sarcoma, and Wilms' tumor. It is also used to treat bladder cancer, breast cancer, lungcancer, ovarian cancer, stomach cancer, and thyroid cancer. This medicine may be used for other purposes; ask your health care provider orpharmacist if you have questions. COMMON BRAND NAME(S): Adriamycin, Adriamycin PFS, Adriamycin RDF, Rubex What should I tell my care team before I take this medication? They need to know if you have any of these conditions: heart disease history of low blood counts caused by a medicine liver disease recent or ongoing radiation  therapy an unusual or allergic reaction to doxorubicin, other chemotherapy agents, other medicines, foods, dyes, or preservatives pregnant or trying to get pregnant breast-feeding How should I use this medication? This drug is given as an infusion into a vein. It is administered in a hospital or clinic by a specially trained health care professional. If you have pain, swelling, burning or any unusual feeling around the site of your injection,tell your health care professional right away. Talk to your pediatrician regarding the use of this  medicine in children.Special care may be needed. Overdosage: If you think you have taken too much of this medicine contact apoison control center or emergency room at once. NOTE: This medicine is only for you. Do not share this medicine with others. What if I miss a dose? It is important not to miss your dose. Call your doctor or health careprofessional if you are unable to keep an appointment. What may interact with this medication? This medicine may interact with the following medications: 6-mercaptopurine paclitaxel phenytoin St. John's Wort trastuzumab verapamil This list may not describe all possible interactions. Give your health care provider a list of all the medicines, herbs, non-prescription drugs, or dietary supplements you use. Also tell them if you smoke, drink alcohol, or use illegaldrugs. Some items may interact with your medicine. What should I watch for while using this medication? This drug may make you feel generally unwell. This is not uncommon, as chemotherapy can affect healthy cells as well as cancer cells. Report any side effects. Continue your course of treatment even though you feel ill unless yourdoctor tells you to stop. There is a maximum amount of this medicine you should receive throughout your life. The amount depends on the medical condition being treated and your overall health. Your doctor will watch how much of this medicine you receive inyour lifetime. Tell your doctor if you have taken this medicine before. You may need blood work done while you are taking this medicine. Your urine may turn red for a few days after your dose. This is not blood. Ifyour urine is dark or brown, call your doctor. In some cases, you may be given additional medicines to help with side effects.Follow all directions for their use. Call your doctor or health care professional for advice if you get a fever, chills or sore throat, or other symptoms of a cold or flu. Do not treat  yourself. This drug decreases your body's ability to fight infections. Try toavoid being around people who are sick. This medicine may increase your risk to bruise or bleed. Call your doctor orhealth care professional if you notice any unusual bleeding. Talk to your doctor about your risk of cancer. You may be more at risk forcertain types of cancers if you take this medicine. Do not become pregnant while taking this medicine or for 6 months after stopping it. Women should inform their doctor if they wish to become pregnant or think they might be pregnant. Men should not father a child while taking this medicine and for 6 months after stopping it. There is a potential for serious side effects to an unborn child. Talk to your health care professional or pharmacist for more information. Do not breast-feed an infant while takingthis medicine. This medicine has caused ovarian failure in some women and reduced sperm counts in some men This medicine may interfere with the ability to have a child. Talk with your doctor or health care professional if you are concerned about yourfertility. This medicine may  cause a decrease in Co-Enzyme Q-10. You should make sure that you get enough Co-Enzyme Q-10 while you are taking this medicine. Discuss thefoods you eat and the vitamins you take with your health care professional. What side effects may I notice from receiving this medication? Side effects that you should report to your doctor or health care professionalas soon as possible: allergic reactions like skin rash, itching or hives, swelling of the face, lips, or tongue breathing problems chest pain fast or irregular heartbeat low blood counts - this medicine may decrease the number of Hoy Fallert blood cells, red blood cells and platelets. You may be at increased risk for infections and bleeding. pain, redness, or irritation at site where injected signs of infection - fever or chills, cough, sore throat, pain or  difficulty passing urine signs of decreased platelets or bleeding - bruising, pinpoint red spots on the skin, black, tarry stools, blood in the urine swelling of the ankles, feet, hands tiredness weakness Side effects that usually do not require medical attention (report to yourdoctor or health care professional if they continue or are bothersome): diarrhea hair loss mouth sores nail discoloration or damage nausea red colored urine vomiting This list may not describe all possible side effects. Call your doctor for medical advice about side effects. You may report side effects to FDA at1-800-FDA-1088. Where should I keep my medication? This drug is given in a hospital or clinic and will not be stored at home. NOTE: This sheet is a summary. It may not cover all possible information. If you have questions about this medicine, talk to your doctor, pharmacist, orhealth care provider.  2022 Elsevier/Gold Standard (2016-12-31 11:01:26)  Vincristine injection What is this medication? VINCRISTINE (vin KRIS teen) is a chemotherapy drug. It slows the growth of cancer cells. This medicine is used to treat many types of cancer like Hodgkin's disease, leukemia, non-Hodgkin's lymphoma, neuroblastoma (braincancer), rhabdomyosarcoma, and Wilms' tumor. This medicine may be used for other purposes; ask your health care provider orpharmacist if you have questions. COMMON BRAND NAME(S): Oncovin, Vincasar PFS What should I tell my care team before I take this medication? They need to know if you have any of these conditions: blood disorders gout infection (especially chickenpox, cold sores, or herpes) kidney disease liver disease lung disease nervous system disease like Charcot-Marie-Tooth (CMT) recent or ongoing radiation therapy an unusual or allergic reaction to vincristine, other chemotherapy agents, other medicines, foods, dyes, or preservatives pregnant or trying to get  pregnant breast-feeding How should I use this medication? This drug is given as an infusion into a vein. It is administered in a hospital or clinic by a specially trained health care professional. If you have pain, swelling, burning, or any unusual feeling around the site of your injection,tell your health care professional right away. Talk to your pediatrician regarding the use of this medicine in children. Whilethis drug may be prescribed for selected conditions, precautions do apply. Overdosage: If you think you have taken too much of this medicine contact apoison control center or emergency room at once. NOTE: This medicine is only for you. Do not share this medicine with others. What if I miss a dose? It is important not to miss your dose. Call your doctor or health careprofessional if you are unable to keep an appointment. What may interact with this medication? certain medicines for fungal infections like itraconazole, ketoconazole, posaconazole, voriconazole certain medicines for seizures like phenytoin This list may not describe all possible interactions. Give your health care  provider a list of all the medicines, herbs, non-prescription drugs, or dietary supplements you use. Also tell them if you smoke, drink alcohol, or use illegaldrugs. Some items may interact with your medicine. What should I watch for while using this medication? This drug may make you feel generally unwell. This is not uncommon, as chemotherapy can affect healthy cells as well as cancer cells. Report any side effects. Continue your course of treatment even though you feel ill unless yourdoctor tells you to stop. You may need blood work done while you are taking this medicine. This medicine will cause constipation. Try to have a bowel movement at least every 2 to 3 days. If you do not have a bowel movement for 3 days, call yourdoctor or health care professional. In some cases, you may be given additional medicines to  help with side effects.Follow all directions for their use. Do not become pregnant while taking this medicine. Women should inform their doctor if they wish to become pregnant or think they might be pregnant. There is a potential for serious side effects to an unborn child. Talk to your health care professional or pharmacist for more information. Do not breast-feed aninfant while taking this medicine. This medicine may make it more difficult to get pregnant or to father a child.Talk to your healthcare professional if you are concerned about your fertility. What side effects may I notice from receiving this medication? Side effects that you should report to your doctor or health care professionalas soon as possible: allergic reactions like skin rash, itching or hives, swelling of the face, lips, or tongue breathing problems confusion or changes in emotions or moods constipation cough mouth sores muscle weakness nausea and vomiting pain, swelling, redness or irritation at the injection site pain, tingling, numbness in the hands or feet problems with balance, talking, walking seizures stomach pain trouble passing urine or change in the amount of urine Side effects that usually do not require medical attention (report to yourdoctor or health care professional if they continue or are bothersome): diarrhea hair loss jaw pain loss of appetite This list may not describe all possible side effects. Call your doctor for medical advice about side effects. You may report side effects to FDA at1-800-FDA-1088. Where should I keep my medication? This drug is given in a hospital or clinic and will not be stored at home. NOTE: This sheet is a summary. It may not cover all possible information. If you have questions about this medicine, talk to your doctor, pharmacist, orhealth care provider.  2022 Elsevier/Gold Standard (2019-04-19 17:05:13)  Cyclophosphamide Injection What is this  medication? CYCLOPHOSPHAMIDE (sye kloe FOSS fa mide) is a chemotherapy drug. It slows the growth of cancer cells. This medicine is used to treat many types of cancer like lymphoma, myeloma, leukemia, breast cancer, and ovarian cancer, to name afew. This medicine may be used for other purposes; ask your health care provider orpharmacist if you have questions. COMMON BRAND NAME(S): Cytoxan, Neosar What should I tell my care team before I take this medication? They need to know if you have any of these conditions: heart disease history of irregular heartbeat infection kidney disease liver disease low blood counts, like Eulis Salazar cells, platelets, or red blood cells on hemodialysis recent or ongoing radiation therapy scarring or thickening of the lungs trouble passing urine an unusual or allergic reaction to cyclophosphamide, other medicines, foods, dyes, or preservatives pregnant or trying to get pregnant breast-feeding How should I use this medication? This drug is  usually given as an injection into a vein or muscle or by infusion into a vein. It is administered in a hospital or clinic by a specially trainedhealth care professional. Talk to your pediatrician regarding the use of this medicine in children.Special care may be needed. Overdosage: If you think you have taken too much of this medicine contact apoison control center or emergency room at once. NOTE: This medicine is only for you. Do not share this medicine with others. What if I miss a dose? It is important not to miss your dose. Call your doctor or health careprofessional if you are unable to keep an appointment. What may interact with this medication? amphotericin B azathioprine certain antivirals for HIV or hepatitis certain medicines for blood pressure, heart disease, irregular heart beat certain medicines that treat or prevent blood clots like warfarin certain other medicines for  cancer cyclosporine etanercept indomethacin medicines that relax muscles for surgery medicines to increase blood counts metronidazole This list may not describe all possible interactions. Give your health care provider a list of all the medicines, herbs, non-prescription drugs, or dietary supplements you use. Also tell them if you smoke, drink alcohol, or use illegaldrugs. Some items may interact with your medicine. What should I watch for while using this medication? Your condition will be monitored carefully while you are receiving thismedicine. You may need blood work done while you are taking this medicine. Drink water or other fluids as directed. Urinate often, even at night. Some products may contain alcohol. Ask your health care professional if this medicine contains alcohol. Be sure to tell all health care professionals you are taking this medicine. Certain medicines, like metronidazole and disulfiram, can cause an unpleasant reaction when taken with alcohol. The reaction includes flushing, headache, nausea, vomiting, sweating, and increased thirst. Thereaction can last from 30 minutes to several hours. Do not become pregnant while taking this medicine or for 1 year after stopping it. Women should inform their health care professional if they wish to become pregnant or think they might be pregnant. Men should not father a child while taking this medicine and for 4 months after stopping it. There is potential for serious side effects to an unborn child. Talk to your health care professionalfor more information. Do not breast-feed an infant while taking this medicine or for 1 week afterstopping it. This medicine has caused ovarian failure in some women. This medicine may make it more difficult to get pregnant. Talk to your health care professional if Ventura Sellers concerned about your fertility. This medicine has caused decreased sperm counts in some men. This may make it more difficult to father a  child. Talk to your health care professional if Ventura Sellers concerned about your fertility. Call your health care professional for advice if you get a fever, chills, or sore throat, or other symptoms of a cold or flu. Do not treat yourself. This medicine decreases your body's ability to fight infections. Try to avoid beingaround people who are sick. Avoid taking medicines that contain aspirin, acetaminophen, ibuprofen, naproxen, or ketoprofen unless instructed by your health care professional.These medicines may hide a fever. Talk to your health care professional about your risk of cancer. You may bemore at risk for certain types of cancer if you take this medicine. If you are going to need surgery or other procedure, tell your health careprofessional that you are using this medicine. Be careful brushing or flossing your teeth or using a toothpick because you may get an infection or bleed more  easily. If you have any dental work done, Primary school teacher you are receiving this medicine. What side effects may I notice from receiving this medication? Side effects that you should report to your doctor or health care professionalas soon as possible: allergic reactions like skin rash, itching or hives, swelling of the face, lips, or tongue breathing problems nausea, vomiting signs and symptoms of bleeding such as bloody or black, tarry stools; red or dark brown urine; spitting up blood or brown material that looks like coffee grounds; red spots on the skin; unusual bruising or bleeding from the eyes, gums, or nose signs and symptoms of heart failure like fast, irregular heartbeat, sudden weight gain; swelling of the ankles, feet, hands signs and symptoms of infection like fever; chills; cough; sore throat; pain or trouble passing urine signs and symptoms of kidney injury like trouble passing urine or change in the amount of urine signs and symptoms of liver injury like dark yellow or brown urine; general ill  feeling or flu-like symptoms; light-colored stools; loss of appetite; nausea; right upper belly pain; unusually weak or tired; yellowing of the eyes or skin Side effects that usually do not require medical attention (report to yourdoctor or health care professional if they continue or are bothersome): confusion decreased hearing diarrhea facial flushing hair loss headache loss of appetite missed menstrual periods signs and symptoms of low red blood cells or anemia such as unusually weak or tired; feeling faint or lightheaded; falls skin discoloration This list may not describe all possible side effects. Call your doctor for medical advice about side effects. You may report side effects to FDA at1-800-FDA-1088. Where should I keep my medication? This drug is given in a hospital or clinic and will not be stored at home. NOTE: This sheet is a summary. It may not cover all possible information. If you have questions about this medicine, talk to your doctor, pharmacist, orhealth care provider.  2022 Elsevier/Gold Standard (2019-02-21 09:53:29)  Rituximab Injection What is this medication? RITUXIMAB (ri TUX i mab) is a monoclonal antibody. It is used to treat certain types of cancer like non-Hodgkin lymphoma and chronic lymphocytic leukemia. It is also used to treat rheumatoid arthritis, granulomatosis with polyangiitis,microscopic polyangiitis, and pemphigus vulgaris. This medicine may be used for other purposes; ask your health care provider orpharmacist if you have questions. COMMON BRAND NAME(S): RIABNI, Rituxan, RUXIENCE What should I tell my care team before I take this medication? They need to know if you have any of these conditions: chest pain heart disease infection especially a viral infection such as chickenpox, cold sores, hepatitis B, or herpes immune system problems irregular heartbeat or rhythm kidney disease low blood counts (Tersea Aulds cells, platelets, or red cells) lung  disease recent or upcoming vaccine an unusual or allergic reaction to rituximab, other medicines, foods, dyes, or preservatives pregnant or trying to get pregnant breast-feeding How should I use this medication? This medicine is injected into a vein. It is given by a health care provider ina hospital or clinic setting. A special MedGuide will be given to you before each treatment. Be sure to readthis information carefully each time. Talk to your health care provider about the use of this medicine in children. While this drug may be prescribed for children as young as 6 months forselected conditions, precautions do apply. Overdosage: If you think you have taken too much of this medicine contact apoison control center or emergency room at once. NOTE: This medicine is only for you. Do not  share this medicine with others. What if I miss a dose? Keep appointments for follow-up doses. It is important not to miss your dose.Call your health care provider if you are unable to keep an appointment. What may interact with this medication? Do not take this medicine with any of the following medicines: live vaccines This medicine may also interact with the following medicines: cisplatin This list may not describe all possible interactions. Give your health care provider a list of all the medicines, herbs, non-prescription drugs, or dietary supplements you use. Also tell them if you smoke, drink alcohol, or use illegaldrugs. Some items may interact with your medicine. What should I watch for while using this medication? Your condition will be monitored carefully while you are receiving thismedicine. You may need blood work done while you are taking this medicine. This medicine can cause serious infusion reactions. To reduce the risk your health care provider may give you other medicines to take before receiving thisone. Be sure to follow the directions from your health care provider. This medicine may  increase your risk of getting an infection. Call your health care provider for advice if you get a fever, chills, sore throat, or other symptoms of a cold or flu. Do not treat yourself. Try to avoid being aroundpeople who are sick. Call your health care provider if you are around anyone with measles,chickenpox, or if you develop sores or blisters that do not heal properly. Avoid taking medicines that contain aspirin, acetaminophen, ibuprofen, naproxen, or ketoprofen unless instructed by your health care provider. Thesemedicines may hide a fever. This medicine may cause serious skin reactions. They can happen weeks to months after starting the medicine. Contact your health care provider right away if you notice fevers or flu-like symptoms with a rash. The rash may be red or purple and then turn into blisters or peeling of the skin. Or, you might notice a red rash with swelling of the face, lips or lymph nodes in your neck or underyour arms. In some patients, this medicine may cause a serious brain infection that may cause death. If you have any problems seeing, thinking, speaking, walking, or standing, tell your healthcare professional right away. If you cannot reachyour healthcare professional, urgently seek other source of medical care. Do not become pregnant while taking this medicine or for at least 12 months after stopping it. Women should inform their health care provider if they wish to become pregnant or think they might be pregnant. There is potential for serious harm to an unborn child. Talk to your health care provider for more information. Women should use a reliable form of birth control while taking this medicine and for 12 months after stopping it. Do not breast-feed whiletaking this medicine or for at least 6 months after stopping it. What side effects may I notice from receiving this medication? Side effects that you should report to your health care provider as soon aspossible: allergic  reactions (skin rash, itching or hives; swelling of the face, lips, or tongue) diarrhea edema (sudden weight gain; swelling of the ankles, feet, hands or other unusual swelling; trouble breathing) fast, irregular heartbeat heart attack (trouble breathing; pain or tightness in the chest, neck, back or arms; unusually weak or tired) infection (fever, chills, cough, sore throat, pain or trouble passing urine) kidney injury (trouble passing urine or change in the amount of urine) liver injury (dark yellow or brown urine; general ill feeling or flu-like symptoms; loss of appetite, right upper belly pain; unusually  weak or tired, yellowing of the eyes or skin) low blood pressure (dizziness; feeling faint or lightheaded, falls; unusually weak or tired) low red blood cell counts (trouble breathing; feeling faint; lightheaded, falls; unusually weak or tired) mouth sores redness, blistering, peeling, or loosening of the skin, including inside the mouth stomach pain unusual bruising or bleeding wheezing (trouble breathing with loud or whistling sounds) vomiting Side effects that usually do not require medical attention (report to yourhealth care provider if they continue or are bothersome): headache joint pain muscle cramps, pain nausea This list may not describe all possible side effects. Call your doctor for medical advice about side effects. You may report side effects to FDA at1-800-FDA-1088. Where should I keep my medication? This medicine is given in a hospital or clinic. It will not be stored at home. NOTE: This sheet is a summary. It may not cover all possible information. If you have questions about this medicine, talk to your doctor, pharmacist, orhealth care provider.  2022 Elsevier/Gold Standard (2020-05-10 15:47:26)

## 2020-12-28 NOTE — Progress Notes (Signed)
Pt received RCHOP infusion regimen in clinic today. Tolerated well. No complaints at d/c.

## 2020-12-28 NOTE — Progress Notes (Signed)
[facility] NOTE  Patient Care Team: [name], MD as PCP - General (Internal Medicine)  CHIEF COMPLAINTS/PURPOSE OF CONSULTATION: Lymphoma Oncology History Overview Note   # Incidental- MAY 2022-CT scan chest abdomen pelvis consistent with bulky retroperitoneal adenopathy [6-8 cm]; subpectoral/axillary lymphadenopathy 2 to 2.5 cm; PET June 2022-bulky right axillary/subpectoral; bulky retroperitoneal adenopathy.  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  Repeat MRI 3-6 m   # coloscopy [2022; [facility]]; mammogram- nov 2021-WNL.   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 .  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  female newly diagnosed diffuse large B-cell lymphoma stage III/IV [bone marrow biopsy pending] is here to proceed with cycle #2 of R-CHOP chemotherapy.  Patient denies any significant nausea vomiting post cycle #1.  Appetite improving.  She is currently  walking using a rolling walker [rather wheelchair].  Denies any headaches.  Patient's back pain is improved..  Review of Systems  Constitutional:  Positive for malaise/fatigue. Negative for chills, diaphoresis and fever.  HENT:  Negative for nosebleeds and sore throat.   Eyes:  Negative for double vision.  Respiratory:  Negative for cough, hemoptysis, sputum production, shortness of breath and wheezing.   Cardiovascular:  Negative for chest pain, palpitations, orthopnea and leg swelling.  Gastrointestinal:  Negative for abdominal pain, blood in stool, diarrhea, heartburn, melena, nausea and vomiting.  Genitourinary:  Negative for dysuria, frequency and urgency.  Musculoskeletal:  Positive for joint pain.  Skin: Negative.  Negative for itching and rash.  Neurological:  Negative for dizziness, tingling, focal weakness, weakness and headaches.  Endo/Heme/Allergies:  Does not bruise/bleed easily.  Psychiatric/Behavioral:  Negative for depression. The patient is not nervous/anxious and does not have insomnia.     MEDICAL HISTORY:  Past Medical History:  Diagnosis Date   Colon polyps    Diverticulosis    Melanoma ([place]) 1988   lt upper arm, some lymph nodes removed   Mild intermittent asthma    Osteoporosis     SURGICAL HISTORY: Past Surgical History:  Procedure Laterality Date   BACK SURGERY     CATARACT EXTRACTION     IR IMAGING GUIDED PORT INSERTION  11/23/2020   TONSILLECTOMY     VAGINAL HYSTERECTOMY  1990    SOCIAL HISTORY: Social History   Socioeconomic History   Marital status: Married    Spouse name: Mikki Santee   Number of children:  2   Years of education: Not on file   Highest education level: Not on file  Occupational History   Not on file  Tobacco Use   Smoking status: Never   Smokeless tobacco: Never  Substance and Sexual Activity   Alcohol use: Never   Drug use: Never   Sexual activity: Yes    Partners: Male  Other Topics Concern   Not on file  Social History  Narrative   Lives at home with husband.    Social Determinants of Health   Financial Resource Strain: Not on file  Food Insecurity: Not on file  Transportation Needs: Not on file  Physical Activity: Not on file  Stress: Not on file  Social Connections: Not on file  Intimate Partner Violence: Not on file    FAMILY HISTORY: Family History  Problem Relation Age of Onset   Breast cancer Neg Hx     ALLERGIES:  has No Known Allergies.  MEDICATIONS:  Current Outpatient Medications  Medication Sig Dispense Refill   acetaminophen (TYLENOL) 500 MG tablet Take 500 mg by mouth every 6 (six) hours as needed.     acyclovir (ZOVIRAX) 400 MG tablet Take 1 tablet (400 mg total) by mouth 2 (two) times daily. To prevent shingles 60 tablet 3   allopurinol (ZYLOPRIM) 300 MG tablet Take 1 tablet (300 mg total) by mouth 2 (two) times daily. To avoid gout flare up while on chemo 60 tablet 2   Cholecalciferol (VITAMIN D) 50 MCG (2000 UT) CAPS Take 1 capsule by mouth daily.     gabapentin (NEURONTIN) 100 MG capsule Take 100 mg by mouth at bedtime. Not sure of dose     Magnesium Oxide (MAG-OXIDE PO) Take 1 capsule by mouth daily at 12 noon.     meloxicam (MOBIC) 15 MG tablet Take 1 tablet by mouth daily.     methocarbamol (ROBAXIN) 500 MG tablet Take 500 mg by mouth in the morning and at bedtime.     ondansetron (ZOFRAN) 8 MG tablet Take 1 tablet (8 mg total) by mouth 2 (two) times daily as needed for refractory nausea / vomiting. Start on day 3 after cyclophosphamide chemotherapy. (Patient not taking: Reported on 12/28/2020) 30 tablet 1   predniSONE (DELTASONE) 50 MG tablet Take 2 tablets (100 mg total) by mouth daily with breakfast. 10 tablet 4   prochlorperazine (COMPAZINE) 10 MG tablet Take 1 tablet (10 mg total) by mouth every 6 (six) hours as needed (Nausea or vomiting). (Patient not taking: No sig reported) 30 tablet 6   traMADol (ULTRAM) 50 MG tablet Take 1 tablet (50 mg total) by mouth every 12  (twelve) hours as needed for moderate pain. (Patient not taking: Reported on 12/28/2020) 12 tablet 0   No current facility-administered medications for this visit.      .  PHYSICAL EXAMINATION: ECOG PERFORMANCE STATUS: 1 - Symptomatic but completely ambulatory  Vitals:   12/28/20 0838  BP: 136/70  Pulse: 66  Resp: 16  Temp: 99.5 F (37.5 C)  SpO2: 99%   Filed Weights   12/28/20 0838  Weight: 118 lb 3.2 oz (53.6 kg)     Physical Exam Constitutional:      Comments: Accompanied by husband.  Walking with a rolling walker.  Neck adenopathy right axilla adenopathy improved.  HENT:     Head: Normocephalic and atraumatic.     Mouth/Throat:     Pharynx: No oropharyngeal exudate.  Eyes:     Pupils: Pupils are equal, round,   and reactive to light.  Cardiovascular:     Rate and Rhythm: Normal rate and regular rhythm.  Pulmonary:     Effort: Pulmonary effort is normal. No respiratory distress.     Breath sounds: Normal breath sounds. No wheezing.  Abdominal:     General: Bowel sounds are normal. There is no distension.     Palpations: Abdomen is soft. There is no mass.     Tenderness: There is no abdominal tenderness. There is no guarding or rebound.  Musculoskeletal:        General: No tenderness. Normal range of motion.     Cervical back: Normal range of motion and neck supple.  Skin:    General: Skin is warm.  Neurological:     Mental Status: She is alert and oriented to person, place, and time.  Psychiatric:        Mood and Affect: Affect normal.     LABORATORY DATA:  I have reviewed the data as listed Lab Results  Component Value Date   WBC 17.7 (H) 12/28/2020   HGB 12.3 12/28/2020   HCT 38.4 12/28/2020   MCV 87.1 12/28/2020   PLT 404 (H) 12/28/2020   Recent Labs    11/21/20 0850 12/07/20 0807 12/17/20 0818 12/28/20 0820  NA 132* 133* 130* 131*  K 3.4* 3.0* 2.7* 3.4*  CL 87* 92* 91* 92*  CO2 33* 31 28 28  GLUCOSE 101* 160* 142* 148*  BUN 19 18 10 14   CREATININE 0.96 1.00 0.83 0.82  CALCIUM 10.3 8.9 9.0 8.9  GFRNONAA >60 59* >60 >60  PROT 7.0 6.3*  --  5.9*  ALBUMIN 3.5 3.4*  --  3.5  AST 19 26  --  23  ALT 15 24  --  20  ALKPHOS 76 67  --  65  BILITOT 0.5 0.7  --  0.3    RADIOGRAPHIC STUDIES: I have personally reviewed the radiological images as listed and agreed with the findings in the report.   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  S/p right axilla lymph node biopsy-large B-cell lymphoma. REPAT Bx- show Bcl-2/cMYC by IHC. FISH studies (MYC break apart and MYC/IGH fusion probes) are negative  for MYC rearrangement.  July 2022 bone marrow biopsy negative currently s/p cycle #1 of Rituxan' # 1-RCHOP  Tolerated well.  MUGA scan [6/28]:Left ventricular ejection fraction equals  55.9 %.   #Proceed with R-CHOP chemotherapy cycle #2 today. Labs today reviewed;  acceptable for treatment today.    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  Improved.  Consider intrathecal given the proximity of the nerve roots.  Await repeat PET scan.  # Hypokalemia: sec to chemo- s/p KCL-today potassium is 3.4.  Monitor for now.  Dietary supplementation.  # s/p COVID EVUSHELD PROPHYLAXIS/ vaccinated to COVID.   #Gout/shingles prophylaxis- allopurinol/acyclovir ordered.  * [phone] [home]  # DISPOSITION: # chemo today  # Ziextenzo on 8/01.  # in 10days- labs- cbc/bmp;possible IVFs/possible KCL # follow up AUG 19th h MD; labs- cbc/cmp/LDH; R-CHOP; possible KCL 20 meq;8/22 Ziextenzo; PET scan prior Dr.B     All questions were answered. The patient knows to call the clinic with any problems, questions or concerns.    Govinda R Brahmanday, MD 12/29/2020 11:11 AM    

## 2020-12-29 ENCOUNTER — Encounter: Payer: Self-pay | Admitting: Internal Medicine

## 2020-12-31 ENCOUNTER — Inpatient Hospital Stay: Payer: Medicare Other | Attending: Internal Medicine

## 2020-12-31 ENCOUNTER — Other Ambulatory Visit: Payer: Self-pay

## 2020-12-31 DIAGNOSIS — C8581 Other specified types of non-Hodgkin lymphoma, lymph nodes of head, face, and neck: Secondary | ICD-10-CM

## 2020-12-31 DIAGNOSIS — Z79899 Other long term (current) drug therapy: Secondary | ICD-10-CM | POA: Diagnosis not present

## 2020-12-31 DIAGNOSIS — C8338 Diffuse large B-cell lymphoma, lymph nodes of multiple sites: Secondary | ICD-10-CM | POA: Insufficient documentation

## 2020-12-31 DIAGNOSIS — M549 Dorsalgia, unspecified: Secondary | ICD-10-CM | POA: Diagnosis not present

## 2020-12-31 DIAGNOSIS — G8929 Other chronic pain: Secondary | ICD-10-CM | POA: Diagnosis not present

## 2020-12-31 DIAGNOSIS — E876 Hypokalemia: Secondary | ICD-10-CM | POA: Insufficient documentation

## 2020-12-31 DIAGNOSIS — J189 Pneumonia, unspecified organism: Secondary | ICD-10-CM | POA: Insufficient documentation

## 2020-12-31 DIAGNOSIS — R590 Localized enlarged lymph nodes: Secondary | ICD-10-CM | POA: Diagnosis not present

## 2020-12-31 MED ORDER — PEGFILGRASTIM-BMEZ 6 MG/0.6ML ~~LOC~~ SOSY
6.0000 mg | PREFILLED_SYRINGE | Freq: Once | SUBCUTANEOUS | Status: AC
  Administered 2020-12-31: 6 mg via SUBCUTANEOUS
  Filled 2020-12-31: qty 0.6

## 2021-01-07 ENCOUNTER — Inpatient Hospital Stay: Payer: Medicare Other

## 2021-01-07 VITALS — BP 149/76 | HR 101 | Temp 99.0°F | Resp 18

## 2021-01-07 DIAGNOSIS — C8338 Diffuse large B-cell lymphoma, lymph nodes of multiple sites: Secondary | ICD-10-CM | POA: Diagnosis not present

## 2021-01-07 DIAGNOSIS — E860 Dehydration: Secondary | ICD-10-CM

## 2021-01-07 DIAGNOSIS — E876 Hypokalemia: Secondary | ICD-10-CM

## 2021-01-07 DIAGNOSIS — C8581 Other specified types of non-Hodgkin lymphoma, lymph nodes of head, face, and neck: Secondary | ICD-10-CM

## 2021-01-07 LAB — CBC WITH DIFFERENTIAL/PLATELET
Abs Immature Granulocytes: 1.72 10*3/uL — ABNORMAL HIGH (ref 0.00–0.07)
Basophils Absolute: 0 10*3/uL (ref 0.0–0.1)
Basophils Relative: 0 %
Eosinophils Absolute: 0.1 10*3/uL (ref 0.0–0.5)
Eosinophils Relative: 1 %
HCT: 30.6 % — ABNORMAL LOW (ref 36.0–46.0)
Hemoglobin: 10 g/dL — ABNORMAL LOW (ref 12.0–15.0)
Immature Granulocytes: 12 %
Lymphocytes Relative: 4 %
Lymphs Abs: 0.6 10*3/uL — ABNORMAL LOW (ref 0.7–4.0)
MCH: 28.4 pg (ref 26.0–34.0)
MCHC: 32.7 g/dL (ref 30.0–36.0)
MCV: 86.9 fL (ref 80.0–100.0)
Monocytes Absolute: 0.9 10*3/uL (ref 0.1–1.0)
Monocytes Relative: 6 %
Neutro Abs: 11.2 10*3/uL — ABNORMAL HIGH (ref 1.7–7.7)
Neutrophils Relative %: 77 %
Platelets: 327 10*3/uL (ref 150–400)
RBC: 3.52 MIL/uL — ABNORMAL LOW (ref 3.87–5.11)
RDW: 18.7 % — ABNORMAL HIGH (ref 11.5–15.5)
Smear Review: ADEQUATE
WBC: 14.6 10*3/uL — ABNORMAL HIGH (ref 4.0–10.5)
nRBC: 0.1 % (ref 0.0–0.2)

## 2021-01-07 LAB — COMPREHENSIVE METABOLIC PANEL
ALT: 16 U/L (ref 0–44)
AST: 19 U/L (ref 15–41)
Albumin: 3.2 g/dL — ABNORMAL LOW (ref 3.5–5.0)
Alkaline Phosphatase: 75 U/L (ref 38–126)
Anion gap: 9 (ref 5–15)
BUN: 7 mg/dL — ABNORMAL LOW (ref 8–23)
CO2: 28 mmol/L (ref 22–32)
Calcium: 8.8 mg/dL — ABNORMAL LOW (ref 8.9–10.3)
Chloride: 89 mmol/L — ABNORMAL LOW (ref 98–111)
Creatinine, Ser: 0.67 mg/dL (ref 0.44–1.00)
GFR, Estimated: >60 mL/min (ref >60)
Glucose, Bld: 132 mg/dL — ABNORMAL HIGH (ref 70–99)
Potassium: 3.1 mmol/L — ABNORMAL LOW (ref 3.5–5.1)
Sodium: 126 mmol/L — ABNORMAL LOW (ref 135–145)
Total Bilirubin: 0.3 mg/dL (ref 0.3–1.2)
Total Protein: 6 g/dL — ABNORMAL LOW (ref 6.5–8.1)

## 2021-01-07 MED ORDER — POTASSIUM CHLORIDE 20 MEQ/100ML IV SOLN
20.0000 meq | Freq: Once | INTRAVENOUS | Status: AC
  Administered 2021-01-07: 20 meq via INTRAVENOUS

## 2021-01-07 MED ORDER — HEPARIN SOD (PORK) LOCK FLUSH 100 UNIT/ML IV SOLN
500.0000 [IU] | Freq: Once | INTRAVENOUS | Status: AC
  Administered 2021-01-07: 500 [IU] via INTRAVENOUS
  Filled 2021-01-07: qty 5

## 2021-01-07 MED ORDER — SODIUM CHLORIDE 0.9% FLUSH
10.0000 mL | INTRAVENOUS | Status: AC | PRN
  Administered 2021-01-07: 10 mL via INTRAVENOUS
  Filled 2021-01-07: qty 10

## 2021-01-07 MED ORDER — SODIUM CHLORIDE 0.9 % IV SOLN
Freq: Once | INTRAVENOUS | Status: AC
  Filled 2021-01-07: qty 250

## 2021-01-07 NOTE — Patient Instructions (Signed)
Potassium chloride injection What is this medication? POTASSIUM CHLORIDE (poe TASS i um KLOOR ide) is a potassium supplement used to prevent and to treat low potassium. Potassium is important for the heart, muscles, and nerves. Too much or too little potassium in the body can causeserious problems. This medicine may be used for other purposes; ask your health care provider orpharmacist if you have questions. COMMON BRAND NAME(S): PROAMP What should I tell my care team before I take this medication? They need to know if you have any of these conditions: Addison disease dehydration diabetes (high blood sugar) heart disease high levels of potassium in the blood irregular heartbeat or rhythm kidney disease large areas of burned skin an unusual or allergic reaction to potassium, other medicines, foods, dyes, or preservatives pregnant or trying to get pregnant breast-feeding How should I use this medication? This medicine is injected into a vein. It is given by a health care provider ina hospital or clinic setting. Talk to your health care provider about the use of this medicine in children.Special care may be needed. Overdosage: If you think you have taken too much of this medicine contact apoison control center or emergency room at once. NOTE: This medicine is only for you. Do not share this medicine with others. What if I miss a dose? This does not apply. This medicine is not for regular use. What may interact with this medication? Do not take this medicine with any of the following medications: certain diuretics such as spironolactone, triamterene eplerenone sodium polystyrene sulfonate This medicine may also interact with the following medications: certain medicines for blood pressure or heart disease like lisinopril, losartan, quinapril, valsartan medicines that lower your chance of fighting infection such as cyclosporine, tacrolimus NSAIDs, medicines for pain and inflammation, like  ibuprofen or naproxen other potassium supplements salt substitutes This list may not describe all possible interactions. Give your health care provider a list of all the medicines, herbs, non-prescription drugs, or dietary supplements you use. Also tell them if you smoke, drink alcohol, or use illegaldrugs. Some items may interact with your medicine. What should I watch for while using this medication? Visit your health care provider for regular checks on your progress. Tell your health care provider if your symptoms do not start to get better or if they getworse. You may need blood work while you are taking this medicine. Avoid salt substitutes unless you are told otherwise by your health careprovider. What side effects may I notice from receiving this medication? Side effects that you should report to your doctor or health care professionalas soon as possible: allergic reactions (skin rash, itching, hives, swelling of the face, lips, tongue, or throat) confusion high potassium levels (muscle weakness, fast or irregular heartbeat) low blood pressure (dizziness, feeling faint or lightheaded, blurry vision) pain, tingling, or numbness in lips, hands, or feet pain, redness, or irritation at site where injected trouble breathing Side effects that usually do not require medical attention (report to yourdoctor or health care professional if they continue or are bothersome): diarrhea nausea, vomiting passing gas stomach pain This list may not describe all possible side effects. Call your doctor for medical advice about side effects. You may report side effects to FDA at1-800-FDA-1088. Where should I keep my medication? This medicine is given in a hospital or clinic. It will not be stored at home. NOTE: This sheet is a summary. It may not cover all possible information. If you have questions about this medicine, talk to your doctor, pharmacist,   orhealth care provider.  2022 Elsevier/Gold Standard  (2019-03-17 18:18:09)  

## 2021-01-07 NOTE — Progress Notes (Signed)
Pt received IV hydration with Potassium added for hypokalemia. Discharged to home. VSS. Accompanied by her husband.

## 2021-01-10 ENCOUNTER — Encounter: Payer: Self-pay | Admitting: Internal Medicine

## 2021-01-14 ENCOUNTER — Ambulatory Visit
Admission: RE | Admit: 2021-01-14 | Discharge: 2021-01-14 | Disposition: A | Discharge status: Home / Self Care | Payer: Medicare Other | Source: Ambulatory Visit | Attending: Internal Medicine | Admitting: Internal Medicine

## 2021-01-14 ENCOUNTER — Encounter: Payer: Self-pay | Admitting: Internal Medicine

## 2021-01-14 ENCOUNTER — Telehealth: Payer: Self-pay | Admitting: Internal Medicine

## 2021-01-14 ENCOUNTER — Other Ambulatory Visit: Payer: Self-pay

## 2021-01-14 DIAGNOSIS — K5730 Diverticulosis of large intestine without perforation or abscess without bleeding: Secondary | ICD-10-CM | POA: Insufficient documentation

## 2021-01-14 DIAGNOSIS — C8581 Other specified types of non-Hodgkin lymphoma, lymph nodes of head, face, and neck: Secondary | ICD-10-CM | POA: Diagnosis present

## 2021-01-14 DIAGNOSIS — Z9221 Personal history of antineoplastic chemotherapy: Secondary | ICD-10-CM | POA: Diagnosis not present

## 2021-01-14 DIAGNOSIS — I700 Atherosclerosis of aorta: Secondary | ICD-10-CM | POA: Diagnosis not present

## 2021-01-14 DIAGNOSIS — J329 Chronic sinusitis, unspecified: Secondary | ICD-10-CM | POA: Insufficient documentation

## 2021-01-14 LAB — GLUCOSE, CAPILLARY: Glucose-Capillary: 112 mg/dL — ABNORMAL HIGH (ref 70–99)

## 2021-01-14 MED ORDER — FLUDEOXYGLUCOSE F - 18 (FDG) INJECTION
6.1000 | Freq: Once | INTRAVENOUS | Status: AC | PRN
  Administered 2021-01-14: 6.6 via INTRAVENOUS

## 2021-01-14 MED ORDER — PREDNISONE 20 MG PO TABS: ORAL_TABLET | ORAL | 0 refills | Status: DC

## 2021-01-14 NOTE — Telephone Encounter (Signed)
On 8/15-spoke with radiology regarding concern for rituximab induced pneumonitis versus atypical pneumonitis.  I left a voicemail for the patient to call us back to discuss this further.  Called husband-stated he is in the grocery store; would want to wait to discuss this with his wife.  Will call back in 1 to 2 hours.   GB

## 2021-01-14 NOTE — Telephone Encounter (Signed)
On 8/15-patient/husband regarding results of the CT scan-concerning for pneumonitis from rituximab.  No fever no chills.  No significant cough.  O2 sats at rest 92; however on ambulation desaturation to 89 heart rate 1 20-1 30.   COVID antigen test-negative.  Discussed the option of evaluation in the emergency room versus outpatient management.  As patient is fairly otherwise asymptomatic-I think it is reasonable to proceed with outpatient management.   Recommend checking RT-PCR testing ASAP.  However at any point of time patient noted to have significant shortness of breath or/oxygen levels dropped to less than 88-she should call us; call 911.  Sent a prescription for prednisone 60 mg a day   Patient follow-up with me as planned- on 8/19;   C-cancel chemotherapy on on 8/19; keep rest of the appointments as planned.   H/T-please call patient with testing site for RT-PCR.

## 2021-01-15 ENCOUNTER — Other Ambulatory Visit: Payer: Self-pay

## 2021-01-15 ENCOUNTER — Telehealth: Payer: Self-pay | Admitting: *Deleted

## 2021-01-15 ENCOUNTER — Emergency Department
Admission: EM | Admit: 2021-01-15 | Discharge: 2021-01-15 | Disposition: A | Discharge status: Home / Self Care | Payer: Medicare Other | Attending: Emergency Medicine | Admitting: Emergency Medicine

## 2021-01-15 ENCOUNTER — Emergency Department: Payer: Medicare Other

## 2021-01-15 DIAGNOSIS — J45909 Unspecified asthma, uncomplicated: Secondary | ICD-10-CM | POA: Insufficient documentation

## 2021-01-15 DIAGNOSIS — J3489 Other specified disorders of nose and nasal sinuses: Secondary | ICD-10-CM | POA: Insufficient documentation

## 2021-01-15 DIAGNOSIS — Z20822 Contact with and (suspected) exposure to covid-19: Secondary | ICD-10-CM | POA: Diagnosis not present

## 2021-01-15 DIAGNOSIS — R5383 Other fatigue: Secondary | ICD-10-CM | POA: Insufficient documentation

## 2021-01-15 DIAGNOSIS — R0602 Shortness of breath: Secondary | ICD-10-CM | POA: Diagnosis present

## 2021-01-15 DIAGNOSIS — R059 Cough, unspecified: Secondary | ICD-10-CM | POA: Diagnosis not present

## 2021-01-15 DIAGNOSIS — R000 Tachycardia, unspecified: Secondary | ICD-10-CM | POA: Insufficient documentation

## 2021-01-15 DIAGNOSIS — R531 Weakness: Secondary | ICD-10-CM | POA: Diagnosis not present

## 2021-01-15 LAB — CBC
HCT: 30.7 % — ABNORMAL LOW (ref 36.0–46.0)
Hemoglobin: 10.3 g/dL — ABNORMAL LOW (ref 12.0–15.0)
MCH: 28.8 pg (ref 26.0–34.0)
MCHC: 33.6 g/dL (ref 30.0–36.0)
MCV: 85.8 fL (ref 80.0–100.0)
Platelets: 580 10*3/uL — ABNORMAL HIGH (ref 150–400)
RBC: 3.58 MIL/uL — ABNORMAL LOW (ref 3.87–5.11)
RDW: 18.7 % — ABNORMAL HIGH (ref 11.5–15.5)
WBC: 20.2 10*3/uL — ABNORMAL HIGH (ref 4.0–10.5)
nRBC: 0.1 % (ref 0.0–0.2)

## 2021-01-15 LAB — BASIC METABOLIC PANEL
Anion gap: 14 (ref 5–15)
BUN: 9 mg/dL (ref 8–23)
CO2: 25 mmol/L (ref 22–32)
Calcium: 9.3 mg/dL (ref 8.9–10.3)
Chloride: 88 mmol/L — ABNORMAL LOW (ref 98–111)
Creatinine, Ser: 0.83 mg/dL (ref 0.44–1.00)
GFR, Estimated: >60 mL/min (ref >60)
Glucose, Bld: 112 mg/dL — ABNORMAL HIGH (ref 70–99)
Potassium: 3.5 mmol/L (ref 3.5–5.1)
Sodium: 127 mmol/L — ABNORMAL LOW (ref 135–145)

## 2021-01-15 LAB — RESP PANEL BY RT-PCR (FLU A&B, COVID) ARPGX2
Influenza A by PCR: NEGATIVE
Influenza B by PCR: NEGATIVE
SARS Coronavirus 2 by RT PCR: NEGATIVE

## 2021-01-15 LAB — TROPONIN I (HIGH SENSITIVITY)
Troponin I (High Sensitivity): 27 ng/L — ABNORMAL HIGH (ref <18)
Troponin I (High Sensitivity): 22 ng/L — ABNORMAL HIGH (ref <18)

## 2021-01-15 MED ORDER — DOXYCYCLINE HYCLATE 100 MG PO TABS
100.0000 mg | ORAL_TABLET | Freq: Once | ORAL | Status: AC
  Administered 2021-01-15: 100 mg via ORAL
  Filled 2021-01-15: qty 1

## 2021-01-15 MED ORDER — DOXYCYCLINE HYCLATE 100 MG PO CAPS: 100.0000 mg | ORAL_CAPSULE | Freq: Two times a day (BID) | ORAL | 0 refills | Status: AC

## 2021-01-15 MED ORDER — METHYLPREDNISOLONE SODIUM SUCC 125 MG IJ SOLR
125.0000 mg | Freq: Once | INTRAMUSCULAR | Status: AC
  Administered 2021-01-15: 125 mg via INTRAVENOUS
  Filled 2021-01-15: qty 2

## 2021-01-15 MED ORDER — BENZONATATE 100 MG PO CAPS: 100.0000 mg | ORAL_CAPSULE | Freq: Three times a day (TID) | ORAL | 0 refills | Status: DC | PRN

## 2021-01-15 NOTE — Telephone Encounter (Signed)
Contacted pt's husband. Instructions provided to obtain pcr testing either at local pharmacy or at [facility] on [address]. Husband will take pt this morning to get tested.

## 2021-01-15 NOTE — Telephone Encounter (Signed)
Dr. B contacted patient and pt's husband. Per Dr. Jacinto Reap- patient needs to go to the ER for evaluation of shortness of breath/work up for pneumonia and r/o covid-19.  Pet scan yesterday showed:  New widespread ground-glass opacities in the lungs favoring the lower lobes, moderately hypermetabolic, appearance suspicious for rituximab induced lung disease or atypical pneumonia."  Husband gave verbal understanding to take his wife to the ER.

## 2021-01-15 NOTE — ED Provider Notes (Signed)
[facility] Emergency Department Provider Note  ____________________________________________   Event Date/Time   First MD Initiated Contact with Patient 01/15/21 1329     (approximate)  I have reviewed the triage vital signs and the nursing notes.   HISTORY  Chief Complaint low oxygen levels    HPI Mary Washington is a 75 y.o. female  with h/o large cell lymphoma, here with cough, SOB, fatigue. Pt had PET scan with Dr. Burlene Arnt yesterday, which showed GGO in b/l lungs, diffuse LAD. Reports that she was called last night and told to check her O2. She checked it yesterday and it was around 90, went to mid-/upper 80s with exertion today. She was told to come in for evaluation. She reports that she has been feeling overall weak, SOB. Has had a dry cough. Denies any LE swelling. No chest pain. Reports remote h/o asthma but does not use a daily inhaler. Denies known fevers. No recent med changes. No known sick contacts.        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 Authorizing Provider  acyclovir (ZOVIRAX) 400 MG tablet Take 1 tablet (400 mg total) by mouth 2 (two) times daily. To prevent shingles 11/21/20  Yes [name], MD  allopurinol (ZYLOPRIM) 300 MG tablet Take 1 tablet (300 mg total) by mouth 2 (two) times daily. To  avoid gout flare up while on chemo 11/21/20  Yes Brahmanday, Elisha Headland, MD  benzonatate (TESSALON PERLES) 100 MG capsule Take 1 capsule (100 mg total) by mouth 3 (three) times daily as needed for cough. 01/15/21 01/15/22 Yes Duffy Bruce, MD  Cholecalciferol (VITAMIN D) 50 MCG (2000 UT) CAPS Take 1 capsule by mouth daily.   Yes [provider]  doxycycline (VIBRAMYCIN) 100 MG capsule Take 1 capsule (100 mg total) by mouth 2 (two) times daily for 7 days. 01/15/21 01/22/21 Yes Duffy Bruce, MD  meloxicam (MOBIC) 15 MG tablet Take 1 tablet by mouth daily. 09/21/20  Yes [provider]  methocarbamol (ROBAXIN) 500 MG tablet Take 500 mg by mouth in the morning and at bedtime.   Yes [provider]  omeprazole (PRILOSEC) 20 MG capsule Take 20 mg by mouth 2 (two) times daily.   Yes [provider]  acetaminophen (TYLENOL) 500 MG tablet Take 500 mg by mouth every 6 (six) hours as needed. Patient not taking: No sig reported    [provider]  gabapentin (NEURONTIN) 100 MG capsule Take 100 mg by mouth at bedtime. Not sure of dose Patient not taking: No sig reported    [provider]  Magnesium Oxide (MAG-OXIDE PO) Take 1 capsule by mouth daily at 12 noon.    [provider]  ondansetron (ZOFRAN) 8 MG tablet  Take 1 tablet (8 mg total) by mouth 2 (two) times daily as needed for refractory nausea / vomiting. Start on day 3 after cyclophosphamide chemotherapy. Patient not taking: No sig reported 11/26/20   [name], MD  predniSONE (DELTASONE) 20 MG tablet Take 3 pills once a day in AM; with breakfast-start ASAP.  Do not stop until further instructions. Patient not taking: Reported on 01/15/2021 01/14/21   [name], MD  predniSONE (DELTASONE) 50 MG tablet Take 2 tablets (100 mg total) by mouth daily with breakfast. Patient not taking: Reported on 01/15/2021 12/28/20   [name], MD  prochlorperazine (COMPAZINE) 10 MG  tablet Take 1 tablet (10 mg total) by mouth every 6 (six) hours as needed (Nausea or vomiting). Patient not taking: No sig reported 11/26/20   [name], MD  traMADol (ULTRAM) 50 MG tablet Take 1 tablet (50 mg total) by mouth every 12 (twelve) hours as needed for moderate pain. Patient not taking: No sig reported 08/25/20   Sable Feil, PA-C  loratadine (CLARITIN) 10 MG tablet Take 1 tablet by mouth as needed.  11/23/20  [provider]    Allergies Patient has no known allergies.  Family History  Problem Relation Age of Onset   Breast cancer Neg Hx     Social History Social History   Tobacco Use   Smoking status: Never   Smokeless tobacco: Never  Substance Use Topics   Alcohol use: Never   Drug use: Never    Review of Systems  Review of Systems  Constitutional:  Positive for fatigue. Negative for fever.  HENT:  Negative for congestion and sore throat.   Eyes:  Negative for visual disturbance.  Respiratory:  Positive for cough and shortness of breath.   Cardiovascular:  Negative for chest pain.  Gastrointestinal:  Negative for abdominal pain, diarrhea, nausea and vomiting.  Genitourinary:  Negative for flank pain.  Musculoskeletal:  Negative for back pain and neck pain.  Skin:  Negative for rash and wound.  Neurological:  Positive for weakness.  All other systems reviewed and are negative.   ____________________________________________  PHYSICAL EXAM:      VITAL SIGNS: ED Triage Vitals [01/15/21 1111]  Enc Vitals Group     BP 131/67     Pulse Rate (!) 116     Resp 20     Temp 98.8 F (37.1 C)     Temp Source Oral     SpO2 93 %     Weight 118 lb (53.5 kg)     Height '5\' 2"'$  (1.575 m)     Head Circumference      Peak Flow      Pain Score 0     Pain Loc      Pain Edu?      Excl. in [place]?      Physical Exam Vitals and nursing note reviewed.  Constitutional:      General: She is not in acute distress.    Appearance: She is  well-developed.  HENT:     Head: Normocephalic and atraumatic.  Eyes:     Conjunctiva/sclera: Conjunctivae normal.  Cardiovascular:     Rate and Rhythm: Regular rhythm. Tachycardia present.     Heart sounds: Normal heart sounds. No murmur heard.   No friction rub.  Pulmonary:     Effort: Pulmonary effort is normal. No respiratory distress.     Breath sounds: Examination of the right-lower field reveals rhonchi. Examination of the  left-lower field reveals rhonchi. Rhonchi present. No wheezing.  Abdominal:     General: There is no distension.     Palpations: Abdomen is soft.     Tenderness: There is no abdominal tenderness.  Musculoskeletal:     Cervical back: Neck supple.  Skin:    General: Skin is warm.     Capillary Refill: Capillary refill takes less than 2 seconds.  Neurological:     Mental Status: She is alert and oriented to person, place, and time.     Motor: No abnormal muscle tone.      ____________________________________________   LABS (all labs ordered are listed, but only abnormal results are displayed)  Labs Reviewed  BASIC METABOLIC PANEL - Abnormal; Notable for the following components:      Result Value   Sodium 127 (*)    Chloride 88 (*)    Glucose, Bld 112 (*)    All other components within normal limits  CBC - Abnormal; Notable for the following components:   WBC 20.2 (*)    RBC 3.58 (*)    Hemoglobin 10.3 (*)    HCT 30.7 (*)    RDW 18.7 (*)    Platelets 580 (*)    All other components within normal limits  TROPONIN I (HIGH SENSITIVITY) - Abnormal; Notable for the following components:   Troponin I (High Sensitivity) 27 (*)    All other components within normal limits  TROPONIN I (HIGH SENSITIVITY) - Abnormal; Notable for the following components:   Troponin I (High Sensitivity) 22 (*)    All other components within normal limits  RESP PANEL BY RT-PCR (FLU A&B, COVID) [id number]    ____________________________________________  EKG: Sinus  tachycardia, VR 119. PR 126, QRS 126, QTc 500. No acute ST elevations. Intraventricular delay noted, no old tracings to compare. ________________________________________  RADIOLOGY All imaging, including plain films, CT scans, and ultrasounds, independently reviewed by me, and interpretations confirmed via formal radiology reads.  ED MD interpretation:   CXR: Reticulonodular pattern, no focal PNA, no PTX  Official radiology report(s): DG Chest 2 View  Result Date: 01/15/2021 CLINICAL DATA:  75 year old female with shortness of breath EXAM: CHEST - 2 VIEW COMPARISON:  Chest CT 10/02/2020 FINDINGS: Cardiomediastinal silhouette within normal limits. Right IJ port catheter, with the tip terminating at the superior cavoatrial junction. Stigmata of emphysema, with increased retrosternal airspace, flattened hemidiaphragms, increased AP diameter, and hyperinflation on the AP view. Reticulonodular opacities at the lung bases, new from the prior CT, and present on PET-CT 01/14/2021. No large pleural effusion or pneumothorax. Surgical changes of the left axillary region/chest wall. Changes of prior vertebral augmentation of the T12 vertebral body. IMPRESSION: Reticulonodular opacities of the bilateral lung bases, post inflammatory/infectious, present on prior PET-CT. Electronically Signed   By: Corrie Mckusick D.O.   On: 01/15/2021 11:59    ____________________________________________  PROCEDURES   Procedure(s) performed (including Critical Care):  Procedures  ____________________________________________  INITIAL IMPRESSION / MDM / [place] / ED COURSE  As part of my medical decision making, I reviewed the following data within the [facility] notes reviewed and incorporated, Old chart reviewed, Notes from prior ED visits, and [place] Controlled Substance Database       *Mary Washington was evaluated in Emergency Department on 01/15/2021 for the symptoms described in the  history of present illness. She was evaluated in the context of the global COVID-19 pandemic, which necessitated consideration that the patient might be at risk for  infection with the SARS-CoV-2 virus that causes COVID-19. Institutional protocols and algorithms that pertain to the evaluation of patients at risk for COVID-19 are in a state of rapid change based on information released by regulatory bodies including the CDC and federal and state organizations. These policies and algorithms were followed during the patient's care in the ED.  Some ED evaluations and interventions may be delayed as a result of limited staffing during the pandemic.*     Medical Decision Making:  75 yo F here with cough, mild SOB. This has been an ongoing issue but pt had PET CT yesterday which showed possible atypical PNA vs Rituxan-induced pneumonitis, so was sent in for evaluation. She is overall very well appearing here in NAD. Leukocytosis appears to be at baseline. Hyponatremia also near baseline - no tremors or other sx. COVID is negative. CXR shows no large focal PNA. She is satting well on RA here and is able to ambulate without difficulty. Discussed with Dr. Burlene Arnt, given pt's well appearance, stable vitals, and absence of hypoxia, will tx with outpt steroids, doxycycline. Pt given dose of solumedrol here. Prednisone already called in by Dr. Burlene Arnt. Return precautions given.  ____________________________________________  FINAL CLINICAL IMPRESSION(S) / ED DIAGNOSES  Final diagnoses:  None     MEDICATIONS GIVEN DURING THIS VISIT:  Medications  methylPREDNISolone sodium succinate (SOLU-MEDROL) 125 mg/2 mL injection 125 mg (125 mg Intravenous Given 01/15/21 1503)  doxycycline (VIBRA-TABS) tablet 100 mg (100 mg Oral Given 01/15/21 1503)     ED Discharge Orders          Ordered    doxycycline (VIBRAMYCIN) 100 MG capsule  2 times daily        01/15/21 1455    benzonatate (TESSALON PERLES) 100 MG capsule   3 times daily PRN        01/15/21 1455             Note:  This document was prepared using Dragon voice recognition software and may include unintentional dictation errors.   Duffy Bruce, MD 01/15/21 [phone]

## 2021-01-15 NOTE — Discharge Instructions (Signed)
Start the prednisone as prescribed by Dr. Rogue Bussing. You were given a dose of IV steroids in the Er today.  I have also called in an antibiotic, doxycycline, to start taking for possible atypical pneumonia.  Drink plenty of fluids.  Continue your other medications as usual

## 2021-01-15 NOTE — Telephone Encounter (Signed)
Per Maudie Mercury, RN "He forgot to let you know that her O2 has been running between 88 and 90. He thinks that is not in the range that Dr. Rogue Bussing discussed. Can you give him a call please [phone]."

## 2021-01-15 NOTE — ED Triage Notes (Signed)
Pt reports that the oncologist sent her in to be seen because her O2 levels were low and that he seen something on her PET in her lungs.

## 2021-01-15 NOTE — ED Notes (Signed)
Pt ambulated self efficiently without difficulty. Pt O2 sat @ 98% resting. Pt O2 sat during ambulation 94%. Pt returned safely to bedside. O2 sat @ 96%.

## 2021-01-18 ENCOUNTER — Inpatient Hospital Stay (HOSPITAL_BASED_OUTPATIENT_CLINIC_OR_DEPARTMENT_OTHER): Payer: Medicare Other | Admitting: Internal Medicine

## 2021-01-18 ENCOUNTER — Ambulatory Visit
Admission: RE | Admit: 2021-01-18 | Discharge: 2021-01-18 | Disposition: A | Discharge status: Home / Self Care | Payer: Medicare Other | Source: Ambulatory Visit | Attending: Internal Medicine | Admitting: Internal Medicine

## 2021-01-18 ENCOUNTER — Encounter: Payer: Self-pay | Admitting: Internal Medicine

## 2021-01-18 ENCOUNTER — Inpatient Hospital Stay: Payer: Medicare Other

## 2021-01-18 ENCOUNTER — Other Ambulatory Visit: Payer: Self-pay

## 2021-01-18 VITALS — BP 135/79 | HR 119 | Temp 97.8°F | Wt 116.4 lb

## 2021-01-18 DIAGNOSIS — M79605 Pain in left leg: Secondary | ICD-10-CM | POA: Insufficient documentation

## 2021-01-18 DIAGNOSIS — C8581 Other specified types of non-Hodgkin lymphoma, lymph nodes of head, face, and neck: Secondary | ICD-10-CM

## 2021-01-18 DIAGNOSIS — C8338 Diffuse large B-cell lymphoma, lymph nodes of multiple sites: Secondary | ICD-10-CM | POA: Diagnosis not present

## 2021-01-18 LAB — CBC WITH DIFFERENTIAL/PLATELET
Abs Immature Granulocytes: 1.35 10*3/uL — ABNORMAL HIGH (ref 0.00–0.07)
Basophils Absolute: 0.2 10*3/uL — ABNORMAL HIGH (ref 0.0–0.1)
Basophils Relative: 1 %
Eosinophils Absolute: 0 10*3/uL (ref 0.0–0.5)
Eosinophils Relative: 0 %
HCT: 32.1 % — ABNORMAL LOW (ref 36.0–46.0)
Hemoglobin: 10.4 g/dL — ABNORMAL LOW (ref 12.0–15.0)
Immature Granulocytes: 5 %
Lymphocytes Relative: 5 %
Lymphs Abs: 1.3 10*3/uL (ref 0.7–4.0)
MCH: 28.4 pg (ref 26.0–34.0)
MCHC: 32.4 g/dL (ref 30.0–36.0)
MCV: 87.7 fL (ref 80.0–100.0)
Monocytes Absolute: 2.3 10*3/uL — ABNORMAL HIGH (ref 0.1–1.0)
Monocytes Relative: 9 %
Neutro Abs: 20 10*3/uL — ABNORMAL HIGH (ref 1.7–7.7)
Neutrophils Relative %: 80 %
Platelets: 688 10*3/uL — ABNORMAL HIGH (ref 150–400)
RBC: 3.66 MIL/uL — ABNORMAL LOW (ref 3.87–5.11)
RDW: 18.9 % — ABNORMAL HIGH (ref 11.5–15.5)
WBC: 25.3 10*3/uL — ABNORMAL HIGH (ref 4.0–10.5)
nRBC: 0.4 % — ABNORMAL HIGH (ref 0.0–0.2)

## 2021-01-18 LAB — COMPREHENSIVE METABOLIC PANEL
ALT: 25 U/L (ref 0–44)
AST: 31 U/L (ref 15–41)
Albumin: 3.1 g/dL — ABNORMAL LOW (ref 3.5–5.0)
Alkaline Phosphatase: 77 U/L (ref 38–126)
Anion gap: 12 (ref 5–15)
BUN: 15 mg/dL (ref 8–23)
CO2: 29 mmol/L (ref 22–32)
Calcium: 9.9 mg/dL (ref 8.9–10.3)
Chloride: 93 mmol/L — ABNORMAL LOW (ref 98–111)
Creatinine, Ser: 0.83 mg/dL (ref 0.44–1.00)
GFR, Estimated: >60 mL/min (ref >60)
Glucose, Bld: 135 mg/dL — ABNORMAL HIGH (ref 70–99)
Potassium: 3 mmol/L — ABNORMAL LOW (ref 3.5–5.1)
Sodium: 134 mmol/L — ABNORMAL LOW (ref 135–145)
Total Bilirubin: 0.6 mg/dL (ref 0.3–1.2)
Total Protein: 6.1 g/dL — ABNORMAL LOW (ref 6.5–8.1)

## 2021-01-18 MED ORDER — SODIUM CHLORIDE 0.9% FLUSH
10.0000 mL | INTRAVENOUS | Status: DC | PRN
  Administered 2021-01-18: 10 mL via INTRAVENOUS
  Filled 2021-01-18: qty 10

## 2021-01-18 MED ORDER — HEPARIN SOD (PORK) LOCK FLUSH 100 UNIT/ML IV SOLN
500.0000 [IU] | Freq: Once | INTRAVENOUS | Status: AC
  Administered 2021-01-18: 500 [IU] via INTRAVENOUS
  Filled 2021-01-18: qty 5

## 2021-01-18 MED ORDER — POTASSIUM CHLORIDE CRYS ER 20 MEQ PO TBCR: EXTENDED_RELEASE_TABLET | ORAL | 1 refills | Status: DC

## 2021-01-18 NOTE — Progress Notes (Signed)
[facility] NOTE  Patient Care Team: [name], MD as PCP - General (Internal Medicine) [name], MD as Consulting Physician (Oncology)  CHIEF COMPLAINTS/PURPOSE OF CONSULTATION: Lymphoma Oncology History Overview Note   # Incidental- MAY 2022-CT scan chest abdomen pelvis consistent with bulky retroperitoneal adenopathy [6-8 cm]; subpectoral/axillary lymphadenopathy 2 to 2.5 cm; PET June 2022-bulky right axillary/subpectoral; bulky retroperitoneal adenopathy.  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  Repeat MRI 3-6 m   # coloscopy [2022; [facility]]; mammogram- nov 2021-WNL.   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 .  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  female newly diagnosed diffuse large B-cell lymphoma stage III/IV [bone marrow biopsy pending] is here to proceed with cycle #3 of R-CHOP chemotherapy; review results of PET scan.results of PET scan.    However on the interim PET scan done approximately 4 days ago patient noted to have significant bilateral pneumonitis-thought to be from chemotherapy.  Patient was evaluated in the emergency room; s/p IV steroids; currently on prednisone 60 mg a day for the last 2 days.  Denies any nausea vomiting.  No fever no chills.  No worsening cough.  Her pain is improved in the back she wants to come off her pain medications.  However patient complains of swelling and pain in the left lower extremity compared to right.  Review of Systems  Constitutional:  Positive for malaise/fatigue. Negative for chills, diaphoresis and fever.  HENT:  Negative for nosebleeds and sore throat.   Eyes:  Negative for double vision.  Respiratory:  Negative for cough, hemoptysis, sputum production, shortness of breath and wheezing.   Cardiovascular:  Negative for chest pain, palpitations, orthopnea and leg swelling.  Gastrointestinal:  Negative for abdominal pain, blood in stool, diarrhea, heartburn, melena, nausea and vomiting.  Genitourinary:  Negative for dysuria, frequency and urgency.  Musculoskeletal:  Positive for joint pain.  Skin: Negative.  Negative for itching and rash.  Neurological:  Negative for dizziness, tingling, focal weakness, weakness and headaches.  Endo/Heme/Allergies:  Does not bruise/bleed easily.  Psychiatric/Behavioral:  Negative for depression. The patient is not nervous/anxious and does not have insomnia.     MEDICAL HISTORY:  Past Medical History:  Diagnosis Date   Colon polyps    Diverticulosis    Melanoma ([place]) 1988   lt upper arm, some lymph nodes removed   Mild intermittent asthma    Osteoporosis  SURGICAL HISTORY: Past Surgical History:  Procedure Laterality Date   BACK SURGERY     CATARACT EXTRACTION     IR IMAGING GUIDED PORT INSERTION  11/23/2020   TONSILLECTOMY     VAGINAL HYSTERECTOMY  1990    SOCIAL HISTORY: Social History   Socioeconomic History   Marital  status: Married    Spouse name: Mikki Santee   Number of children: 2   Years of education: Not on file   Highest education level: Not on file  Occupational History   Not on file  Tobacco Use   Smoking status: Never   Smokeless tobacco: Never  Substance and Sexual Activity   Alcohol use: Never   Drug use: Never   Sexual activity: Yes    Partners: Male  Other Topics Concern   Not on file  Social History Narrative   Lives at home with husband.    Social Determinants of Health   Financial Resource Strain: Not on file  Food Insecurity: Not on file  Transportation Needs: Not on file  Physical Activity: Not on file  Stress: Not on file  Social Connections: Not on file  Intimate Partner Violence: Not on file    FAMILY HISTORY: Family History  Problem Relation Age of Onset   Breast cancer Neg Hx     ALLERGIES:  has No Known Allergies.  MEDICATIONS:  Current Outpatient Medications  Medication Sig Dispense Refill   acetaminophen (TYLENOL) 500 MG tablet Take 500 mg by mouth every 6 (six) hours as needed.     acyclovir (ZOVIRAX) 400 MG tablet Take 1 tablet (400 mg total) by mouth 2 (two) times daily. To prevent shingles 60 tablet 3   allopurinol (ZYLOPRIM) 300 MG tablet Take 1 tablet (300 mg total) by mouth 2 (two) times daily. To avoid gout flare up while on chemo 60 tablet 2   benzonatate (TESSALON PERLES) 100 MG capsule Take 1 capsule (100 mg total) by mouth 3 (three) times daily as needed for cough. 30 capsule 0   Cholecalciferol (VITAMIN D) 50 MCG (2000 UT) CAPS Take 1 capsule by mouth daily.     Magnesium Oxide (MAG-OXIDE PO) Take 1 capsule by mouth daily at 12 noon.     meloxicam (MOBIC) 15 MG tablet Take 1 tablet by mouth daily.     methocarbamol (ROBAXIN) 500 MG tablet Take 500 mg by mouth in the morning and at bedtime.     omeprazole (PRILOSEC) 20 MG capsule Take 20 mg by mouth 2 (two) times daily.     ondansetron (ZOFRAN) 8 MG tablet Take 1 tablet (8 mg total) by mouth 2 (two)  times daily as needed for refractory nausea / vomiting. Start on day 3 after cyclophosphamide chemotherapy. 30 tablet 1   potassium chloride SA (KLOR-CON) 20 MEQ tablet 1 pill twice a day 60 tablet 1   predniSONE (DELTASONE) 20 MG tablet Take 3 pills once a day in AM; with breakfast-start ASAP.  Do not stop until further instructions. (Patient taking differently: Take 3 pills once a day in AM; with breakfast-start ASAP.  Do not stop until further instructions.) 40 tablet 0   prochlorperazine (COMPAZINE) 10 MG tablet Take 1 tablet (10 mg total) by mouth every 6 (six) hours as needed (Nausea or vomiting). 30 tablet 6   traMADol (ULTRAM) 50 MG tablet Take 1 tablet (50 mg total) by mouth every 12 (twelve) hours as needed for moderate pain. 12 tablet 0   gabapentin (NEURONTIN) 100 MG capsule Take 100 mg  by mouth at bedtime. Not sure of dose (Patient not taking: Reported on 01/18/2021)     montelukast (SINGULAIR) 10 MG tablet TAKE 1 TABLET(10 MG) BY MOUTH AT BEDTIME. START 2 DAYS BEFORE INFUSION. TAKE IT FOR 4 DAYS 90 tablet 1   predniSONE (DELTASONE) 50 MG tablet Take 2 tablets (100 mg total) by mouth daily with breakfast. (Patient not taking: Reported on 01/18/2021) 10 tablet 4   No current facility-administered medications for this visit.   Facility-Administered Medications Ordered in Other Visits  Medication Dose Route Frequency Provider Last Rate Last Admin   sodium chloride flush (NS) 0.9 % injection 10 mL  10 mL Intravenous PRN [name], MD   10 mL at 01/07/21 0919      .  PHYSICAL EXAMINATION: ECOG PERFORMANCE STATUS: 1 - Symptomatic but completely ambulatory  Vitals:   01/18/21 0839 01/18/21 0843  BP: 135/79   Pulse: (!) 125 (!) 119  Temp: 97.8 F (36.6 C)   SpO2: 97%    Filed Weights   01/18/21 0839  Weight: 116 lb 6.4 oz (52.8 kg)     Physical Exam Constitutional:      Comments: Accompanied by husband.  Walking with a rolling walker.  Neck adenopathy right axilla  adenopathy improved.  HENT:     Head: Normocephalic and atraumatic.     Mouth/Throat:     Pharynx: No oropharyngeal exudate.  Eyes:     Pupils: Pupils are equal, round, and reactive to light.  Cardiovascular:     Rate and Rhythm: Normal rate and regular rhythm.  Pulmonary:     Effort: Pulmonary effort is normal. No respiratory distress.     Breath sounds: Normal breath sounds. No wheezing.  Abdominal:     General: Bowel sounds are normal. There is no distension.     Palpations: Abdomen is soft. There is no mass.     Tenderness: no abdominal tenderness There is no guarding or rebound.  Musculoskeletal:        General: Tenderness present. Normal range of motion.     Cervical back: Normal range of motion and neck supple.     Left lower leg: Edema present.  Skin:    General: Skin is warm.  Neurological:     Mental Status: She is alert and oriented to person, place, and time.  Psychiatric:        Mood and Affect: Affect normal.     LABORATORY DATA:  I have reviewed the data as listed Lab Results  Component Value Date   WBC 25.3 (H) 01/18/2021   HGB 10.4 (L) 01/18/2021   HCT 32.1 (L) 01/18/2021   MCV 87.7 01/18/2021   PLT 688 (H) 01/18/2021   Recent Labs    12/28/20 0820 01/07/21 0920 01/15/21 1117 01/18/21 0803  NA 131* 126* 127* 134*  K 3.4* 3.1* 3.5 3.0*  CL 92* 89* 88* 93*  CO2 _0 GLUCOSE 148* 132* 112* 135*  BUN 14 7* 9 15  CREATININE 0.82 0.67 0.83 0.83  CALCIUM 8.9 8.8* 9.3 9.9  GFRNONAA >60 >60 >60 >60  PROT 5.9* 6.0*  --  6.1*  ALBUMIN 3.5 3.2*  --  3.1*  AST 23 19  --  31  ALT 20 16  --  25  ALKPHOS 65 75  --  77  BILITOT 0.3 0.3  --  0.6    RADIOGRAPHIC STUDIES: I have personally reviewed the radiological images as listed and agreed with the findings  in the report.   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  Recommend a slow taper over the next 4 weeks [#Take prednisone 60 mg a day until 7/23; and then take 2 pills once a day; further directions to follow.]  #Acute on chronic back pain-likely secondary to lymphoma impinging the neuroforaminal nerve.  Given the significant response noted-hold off intrathecal chemo  # Hypokalemia: sec to chemo- s/p KCL-today potassium is 3.0. ADD Kdur 20 BID.   # LEFT LE swelling/LE pain- ?DVT- STAT dopplers.   # s/p COVID EVUSHELD PROPHYLAXIS/ vaccinated to [place].   #Gout/shingles prophylaxis- allopurinol/acyclovir ordered.  * [phone] [home]  # DISPOSITION:de-accessed # STAT US LE leg-  # follow up in 2 weeks- MD; labs- cbc/cmp/LDH; NO chemo-Dr.B  Addendum: Stat lower extremity venous Dopplers left leg negative for DVT.  # I reviewed the blood work- with the patient in detail; also reviewed the imaging independently [as summarized above]; and with the patient in detail.      All questions were answered. The patient knows to call the clinic with any problems, questions or concerns.    [name], MD 01/27/2021 9:43 PM

## 2021-01-18 NOTE — Assessment & Plan Note (Addendum)
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  Recommend a slow taper over the next 4 weeks [#Take prednisone 60 mg a day until 7/23; and then take 2 pills once a day; further directions to follow.]  #Acute on chronic back pain-likely secondary to lymphoma impinging the neuroforaminal nerve.  Given the significant response noted-hold off intrathecal chemo  # Hypokalemia: sec to chemo- s/p KCL-today potassium is 3.0. ADD Kdur 20 BID.   # LEFT LE swelling/LE pain- ?DVT- STAT dopplers.   # s/p COVID EVUSHELD PROPHYLAXIS/ vaccinated to [place].   #Gout/shingles prophylaxis- allopurinol/acyclovir ordered.  ID:[id number] [home]  # DISPOSITION:de-accessed # STAT US LE leg-  # follow up in 2 weeks- MD; labs- cbc/cmp/LDH; NO chemo-Dr.B  Addendum: Stat lower extremity venous Dopplers left leg negative for DVT.  # I reviewed the blood work- with the patient in detail; also reviewed the imaging independently [as summarized above]; and with the patient in detail.

## 2021-01-18 NOTE — Patient Instructions (Signed)
#  Take prednisone 60 mg a day until 7/23; and then take 2 pills once a day; further directions to follow.

## 2021-01-21 ENCOUNTER — Inpatient Hospital Stay: Payer: Medicare Other

## 2021-01-24 ENCOUNTER — Other Ambulatory Visit: Payer: Self-pay | Admitting: Internal Medicine

## 2021-01-27 ENCOUNTER — Encounter: Payer: Self-pay | Admitting: Internal Medicine

## 2021-01-29 ENCOUNTER — Other Ambulatory Visit: Payer: Self-pay | Admitting: *Deleted

## 2021-01-29 DIAGNOSIS — C8581 Other specified types of non-Hodgkin lymphoma, lymph nodes of head, face, and neck: Secondary | ICD-10-CM

## 2021-02-01 ENCOUNTER — Inpatient Hospital Stay: Payer: Medicare Other | Attending: Internal Medicine

## 2021-02-01 ENCOUNTER — Encounter: Payer: Self-pay | Admitting: Internal Medicine

## 2021-02-01 ENCOUNTER — Inpatient Hospital Stay (HOSPITAL_BASED_OUTPATIENT_CLINIC_OR_DEPARTMENT_OTHER): Payer: Medicare Other | Admitting: Internal Medicine

## 2021-02-01 VITALS — BP 150/80 | HR 107 | Temp 98.0°F | Resp 16 | Ht 62.0 in | Wt 112.0 lb

## 2021-02-01 DIAGNOSIS — E876 Hypokalemia: Secondary | ICD-10-CM | POA: Diagnosis not present

## 2021-02-01 DIAGNOSIS — C8581 Other specified types of non-Hodgkin lymphoma, lymph nodes of head, face, and neck: Secondary | ICD-10-CM | POA: Diagnosis not present

## 2021-02-01 DIAGNOSIS — Z79899 Other long term (current) drug therapy: Secondary | ICD-10-CM | POA: Insufficient documentation

## 2021-02-01 DIAGNOSIS — Z5111 Encounter for antineoplastic chemotherapy: Secondary | ICD-10-CM | POA: Insufficient documentation

## 2021-02-01 DIAGNOSIS — J189 Pneumonia, unspecified organism: Secondary | ICD-10-CM | POA: Insufficient documentation

## 2021-02-01 DIAGNOSIS — C8331 Diffuse large B-cell lymphoma, lymph nodes of head, face, and neck: Secondary | ICD-10-CM | POA: Insufficient documentation

## 2021-02-01 LAB — CBC WITH DIFFERENTIAL/PLATELET
Abs Immature Granulocytes: 0.17 10*3/uL — ABNORMAL HIGH (ref 0.00–0.07)
Basophils Absolute: 0.1 10*3/uL (ref 0.0–0.1)
Basophils Relative: 0 %
Eosinophils Absolute: 0.2 10*3/uL (ref 0.0–0.5)
Eosinophils Relative: 1 %
HCT: 37.6 % (ref 36.0–46.0)
Hemoglobin: 12 g/dL (ref 12.0–15.0)
Immature Granulocytes: 1 %
Lymphocytes Relative: 3 %
Lymphs Abs: 0.7 10*3/uL (ref 0.7–4.0)
MCH: 29 pg (ref 26.0–34.0)
MCHC: 31.9 g/dL (ref 30.0–36.0)
MCV: 90.8 fL (ref 80.0–100.0)
Monocytes Absolute: 0.9 10*3/uL (ref 0.1–1.0)
Monocytes Relative: 4 %
Neutro Abs: 20.2 10*3/uL — ABNORMAL HIGH (ref 1.7–7.7)
Neutrophils Relative %: 91 %
Platelets: 429 10*3/uL — ABNORMAL HIGH (ref 150–400)
RBC: 4.14 MIL/uL (ref 3.87–5.11)
RDW: 19.9 % — ABNORMAL HIGH (ref 11.5–15.5)
WBC: 22.3 10*3/uL — ABNORMAL HIGH (ref 4.0–10.5)
nRBC: 0 % (ref 0.0–0.2)

## 2021-02-01 LAB — COMPREHENSIVE METABOLIC PANEL
ALT: 24 U/L (ref 0–44)
AST: 25 U/L (ref 15–41)
Albumin: 3.7 g/dL (ref 3.5–5.0)
Alkaline Phosphatase: 90 U/L (ref 38–126)
Anion gap: 10 (ref 5–15)
BUN: 16 mg/dL (ref 8–23)
CO2: 27 mmol/L (ref 22–32)
Calcium: 9.3 mg/dL (ref 8.9–10.3)
Chloride: 95 mmol/L — ABNORMAL LOW (ref 98–111)
Creatinine, Ser: 0.86 mg/dL (ref 0.44–1.00)
GFR, Estimated: >60 mL/min (ref >60)
Glucose, Bld: 106 mg/dL — ABNORMAL HIGH (ref 70–99)
Potassium: 3.8 mmol/L (ref 3.5–5.1)
Sodium: 132 mmol/L — ABNORMAL LOW (ref 135–145)
Total Bilirubin: 0.2 mg/dL — ABNORMAL LOW (ref 0.3–1.2)
Total Protein: 6.3 g/dL — ABNORMAL LOW (ref 6.5–8.1)

## 2021-02-01 LAB — LACTATE DEHYDROGENASE: LDH: 166 U/L (ref 98–192)

## 2021-02-01 NOTE — Assessment & Plan Note (Addendum)
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 [#Start taking prednisone 20 mg or 1 pill a day for 1 week; and then half a pill a day-do not stop until further directed.  ]  #Acute on chronic back pain-likely secondary to lymphoma impinging the neuroforaminal nerve- monitor for now.    # PN- 1- G-1- from chemo  #Reflux likely from prednisone.  Recommend PPI twice daily.  Also Tums as needed.  # Hypokalemia: sec to chemo- s/p KCL-today potassium is 3.8; take potassoim  # LEFT LE swelling/LE pain-lower extremity venous Dopplers left leg negative for DVT.   # s/p COVID EVUSHELD PROPHYLAXIS/ vaccinated to [place].   #Gout/shingles prophylaxis- allopurinol/acyclovir ordered.  [id number] [home]  # DISPOSITION:de-accessed # follow up in 2 weeks- MD; labs- cbc/cmp/LDH; NO chemo-Dr.B

## 2021-02-01 NOTE — Progress Notes (Signed)
[facility] NOTE  Patient Care Team: [name], MD as PCP - General (Internal Medicine) [name], MD as Consulting Physician (Oncology)  CHIEF COMPLAINTS/PURPOSE OF CONSULTATION: Lymphoma Oncology History Overview Note   # Incidental- MAY 2022-CT scan chest abdomen pelvis consistent with bulky retroperitoneal adenopathy [6-8 cm]; subpectoral/axillary lymphadenopathy 2 to 2.5 cm; PET June 2022-bulky right axillary/subpectoral; bulky retroperitoneal adenopathy.  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  Repeat MRI 3-6 m   # coloscopy [2022; [facility]]; mammogram- nov 2021-WNL.   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 .  # SURVIVORSHIP:   # GENETICS:   DIAGNOSIS:   STAGE:         ;  GOALS:  CURRENT/MOST RECENT THERAPY :     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 ([place])  11/21/2020 Initial Diagnosis   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 ([place])   11/28/2020 -  Chemotherapy    Patient is on Treatment Plan: NON-HODGKINS LYMPHOMA R-CHOP Q21D       12/09/2020 Cancer Staging   Staging form: Lymphoid Neoplasms, AJCC 6th Edition - Clinical: Stage IV - Signed by [name], MD on 12/09/2020 Diagnostic confirmation: Positive histology Specimen type: Core Needle Biopsy Histopathologic type: Malignant lymphoma, large B-cell, diffuse, NOS Multiple tumors: Yes      HISTORY OF PRESENTING ILLNESS: Accompanied by husband.  Walking with a rolling walker.   Mary [facility] 75 y.o.  female n diffuse large B-cell lymphoma stage III s/p cycle #3 of R-CHOP chemotherapy-currently chemo on hold because of pneumonitis is here for  follow-up.  Patient is currently on prednisone 40 mg a day.  Also has significant provement of her shortness of breath.  Denies any nausea vomiting abdominal pain.   She is currently off pain medications.  Complains of reflux-like symptoms.  No difficulty swallowing or pain with swallowing.  Review of Systems  Constitutional:  Positive for malaise/fatigue. Negative for chills, diaphoresis and fever.  HENT:  Negative for nosebleeds and sore throat.   Eyes:  Negative for double vision.  Respiratory:  Negative for cough, hemoptysis, sputum production, shortness of breath and wheezing.   Cardiovascular:  Negative for chest pain, palpitations, orthopnea and leg swelling.  Gastrointestinal:  Negative for abdominal pain, blood in stool, diarrhea, heartburn, melena, nausea and vomiting.  Genitourinary:  Negative for dysuria, frequency and urgency.  Musculoskeletal:  Positive for joint pain.  Skin: Negative.  Negative for itching and rash.  Neurological:  Negative for dizziness, tingling, focal weakness, weakness and headaches.  Endo/Heme/Allergies:  Does not bruise/bleed easily.  Psychiatric/Behavioral:  Negative for depression. The patient is not nervous/anxious and does not have insomnia.     MEDICAL HISTORY:  Past Medical History:  Diagnosis Date  . Colon polyps   . Diverticulosis   . Melanoma ([place]) 1988   lt upper arm, some lymph nodes removed  . Mild intermittent asthma   . Osteoporosis     SURGICAL HISTORY: Past Surgical History:  Procedure Laterality Date  . BACK SURGERY    . CATARACT EXTRACTION    . IR IMAGING GUIDED PORT INSERTION  11/23/2020  . TONSILLECTOMY    . [place]  SOCIAL HISTORY: Social History   Socioeconomic History  . Marital status: Married    Spouse name: Mikki Santee  . Number of children: 2  . Years of education: Not on file  . Highest education level: Not on file  Occupational History  . Not on file  Tobacco Use  . Smoking status:  Never  . Smokeless tobacco: Never  Substance and Sexual Activity  . Alcohol use: Never  . Drug use: Never  . Sexual activity: Yes    Partners: Male  Other Topics Concern  . Not on file  Social History Narrative   ** Merged History Encounter **       Lives at home with husband.    Social Determinants of Health   Financial Resource Strain: Not on file  Food Insecurity: Not on file  Transportation Needs: Not on file  Physical Activity: Not on file  Stress: Not on file  Social Connections: Not on file  Intimate Partner Violence: Not on file    FAMILY HISTORY: Family History  Problem Relation Age of Onset  . Breast cancer Neg Hx     ALLERGIES:  has No Known Allergies.  MEDICATIONS:  Current Outpatient Medications  Medication Sig Dispense Refill  . acetaminophen (TYLENOL) 500 MG tablet Take 500 mg by mouth every 6 (six) hours as needed.    Marland Kitchen acyclovir (ZOVIRAX) 400 MG tablet Take 1 tablet (400 mg total) by mouth 2 (two) times daily. To prevent shingles 60 tablet 3  . allopurinol (ZYLOPRIM) 300 MG tablet Take 1 tablet (300 mg total) by mouth 2 (two) times daily. To avoid gout flare up while on chemo 60 tablet 2  . benzonatate (TESSALON PERLES) 100 MG capsule Take 1 capsule (100 mg total) by mouth 3 (three) times daily as needed for cough. 30 capsule 0  . Cholecalciferol (VITAMIN D) 50 MCG (2000 UT) CAPS Take 1 capsule by mouth daily.    . Magnesium Oxide (MAG-OXIDE PO) Take 1 capsule by mouth daily at 12 noon.    . meloxicam (MOBIC) 15 MG tablet Take 1 tablet by mouth daily.    . montelukast (SINGULAIR) 10 MG tablet TAKE 1 TABLET(10 MG) BY MOUTH AT BEDTIME. START 2 DAYS BEFORE INFUSION. TAKE IT FOR 4 DAYS 90 tablet 1  . omeprazole (PRILOSEC) 20 MG capsule Take 20 mg by mouth 2 (two) times daily.    . ondansetron (ZOFRAN) 8 MG tablet Take 1 tablet (8 mg total) by mouth 2 (two) times daily as needed for refractory nausea / vomiting. Start on day 3 after cyclophosphamide  chemotherapy. 30 tablet 1  . potassium chloride SA (KLOR-CON) 20 MEQ tablet 1 pill twice a day 60 tablet 1  . prochlorperazine (COMPAZINE) 10 MG tablet Take 1 tablet (10 mg total) by mouth every 6 (six) hours as needed (Nausea or vomiting). 30 tablet 6  . traMADol (ULTRAM) 50 MG tablet Take 1 tablet (50 mg total) by mouth every 12 (twelve) hours as needed for moderate pain. 12 tablet 0  . gabapentin (NEURONTIN) 100 MG capsule Take 100 mg by mouth at bedtime. Not sure of dose (Patient not taking: No sig reported)    . methocarbamol (ROBAXIN) 500 MG tablet Take 500 mg by mouth in the morning and at bedtime. (Patient not taking: Reported on 02/01/2021)    . predniSONE (DELTASONE) 20 MG tablet Take 1 pill a day in the morning with breakfast x 3 more days; and then 1/2 a pill a day. Do not stop until  further instructions. 30 tablet 0  . predniSONE (DELTASONE) 50 MG tablet Take 2 tablets (100 mg total) by mouth daily with breakfast. (Patient not taking: No sig reported) 10 tablet 4   No current facility-administered medications for this visit.   Facility-Administered Medications Ordered in Other Visits  Medication Dose Route Frequency Provider Last Rate Last Admin  . sodium chloride flush (NS) 0.9 % injection 10 mL  10 mL Intravenous PRN [name], MD   10 mL at 01/07/21 0919      .  PHYSICAL EXAMINATION: ECOG PERFORMANCE STATUS: 1 - Symptomatic but completely ambulatory  Vitals:   02/01/21 1111  BP: (!) 150/80  Pulse: (!) 107  Resp: 16  Temp: 98 F (36.7 C)  SpO2: 100%   Filed Weights   02/01/21 1111  Weight: 112 lb (50.8 kg)     Physical Exam HENT:     Head: Normocephalic and atraumatic.     Mouth/Throat:     Pharynx: No oropharyngeal exudate.  Eyes:     Pupils: Pupils are equal, round, and reactive to light.  Cardiovascular:     Rate and Rhythm: Normal rate and regular rhythm.  Pulmonary:     Effort: Pulmonary effort is normal. No respiratory distress.      Breath sounds: Normal breath sounds. No wheezing.  Abdominal:     General: Bowel sounds are normal. There is no distension.     Palpations: Abdomen is soft. There is no mass.     Tenderness: no abdominal tenderness There is no guarding or rebound.  Musculoskeletal:        General: Tenderness present. Normal range of motion.     Cervical back: Normal range of motion and neck supple.     Left lower leg: Edema present.  Skin:    General: Skin is warm.  Neurological:     Mental Status: She is alert and oriented to person, place, and time.  Psychiatric:        Mood and Affect: Affect normal.     LABORATORY DATA:  I have reviewed the data as listed Lab Results  Component Value Date   WBC 22.3 (H) 02/01/2021   HGB 12.0 02/01/2021   HCT 37.6 02/01/2021   MCV 90.8 02/01/2021   PLT 429 (H) 02/01/2021   Recent Labs    01/07/21 0920 01/15/21 1117 01/18/21 0803 02/01/21 1019  NA 126* 127* 134* 132*  K 3.1* 3.5 3.0* 3.8  CL 89* 88* 93* 95*  CO2 '28 25 29 27  '$ GLUCOSE 132* 112* 135* 106*  BUN 7* '9 15 16  '$ CREATININE 0.67 0.83 0.83 0.86  CALCIUM 8.8* 9.3 9.9 9.3  GFRNONAA >60 >60 >60 >60  PROT 6.0*  --  6.1* 6.3*  ALBUMIN 3.2*  --  3.1* 3.7  AST 19  --  31 25  ALT 16  --  25 24  ALKPHOS 75  --  77 90  BILITOT 0.3  --  0.6 0.2*    RADIOGRAPHIC STUDIES: I have personally reviewed the radiological images as listed and agreed with the findings in the report.   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 [#Start taking prednisone 20 mg or 1 pill a day for 1 week; and then half a pill a day-do not stop until further directed.  ]  #Acute on chronic back pain-likely secondary to lymphoma impinging the neuroforaminal nerve- monitor for now.    # PN- 1- G-1- from chemo  #Reflux likely from prednisone.  Recommend PPI twice daily.  Also Tums as needed.  # Hypokalemia: sec to chemo- s/p KCL-today potassium is 3.8; take potassoim  # LEFT LE swelling/LE pain-lower extremity venous Dopplers left leg negative for DVT.   # s/p COVID EVUSHELD PROPHYLAXIS/ vaccinated to [place].   #Gout/shingles prophylaxis- allopurinol/acyclovir ordered.  TJ:[id number] [home]  # DISPOSITION:de-accessed # follow up in 2 weeks- MD; labs- cbc/cmp/LDH; NO chemo-Dr.B        All questions were answered. The patient knows to call the clinic with any problems, questions or concerns.    [name], MD 02/06/2021 11:29 PM

## 2021-02-01 NOTE — Progress Notes (Signed)
Weighed 116 on 8/19 today weighs 112. Feels like appetite has been better. Concerned about weight loss.

## 2021-02-01 NOTE — Patient Instructions (Addendum)
#  Start taking prednisone 20 mg or 1 pill a day for 1 week; and then half a pill a day-do not stop until further directed.    #Take potassium 1 pill a day until next visit.

## 2021-02-04 ENCOUNTER — Encounter: Payer: Self-pay | Admitting: Internal Medicine

## 2021-02-06 ENCOUNTER — Encounter: Payer: Self-pay | Admitting: Internal Medicine

## 2021-02-06 ENCOUNTER — Telehealth: Payer: Self-pay | Admitting: *Deleted

## 2021-02-06 MED ORDER — PREDNISONE 20 MG PO TABS: ORAL_TABLET | ORAL | 0 refills | Status: DC

## 2021-02-06 NOTE — Progress Notes (Signed)
Spoke to patient's husband Herbie Baltimore regarding prescription for the prednisone sent to the Pharmacy.

## 2021-02-06 NOTE — Telephone Encounter (Signed)
Mr Salcido called stating that patient needs refill on her Prednisone which is being tapered off. He states that she is now on 1 pill a day for a time then she will go to half a pill. He requests a return call when sent or denied

## 2021-02-07 NOTE — Telephone Encounter (Signed)
Dr b spoke with patient.

## 2021-02-11 ENCOUNTER — Encounter: Payer: Self-pay | Admitting: Internal Medicine

## 2021-02-15 ENCOUNTER — Inpatient Hospital Stay (HOSPITAL_BASED_OUTPATIENT_CLINIC_OR_DEPARTMENT_OTHER): Payer: Medicare Other | Admitting: Internal Medicine

## 2021-02-15 ENCOUNTER — Encounter: Payer: Self-pay | Admitting: Internal Medicine

## 2021-02-15 ENCOUNTER — Inpatient Hospital Stay: Payer: Medicare Other

## 2021-02-15 DIAGNOSIS — C8581 Other specified types of non-Hodgkin lymphoma, lymph nodes of head, face, and neck: Secondary | ICD-10-CM | POA: Diagnosis not present

## 2021-02-15 DIAGNOSIS — Z5111 Encounter for antineoplastic chemotherapy: Secondary | ICD-10-CM | POA: Diagnosis not present

## 2021-02-15 LAB — CBC WITH DIFFERENTIAL/PLATELET
Abs Immature Granulocytes: 0.03 10*3/uL (ref 0.00–0.07)
Basophils Absolute: 0 10*3/uL (ref 0.0–0.1)
Basophils Relative: 1 %
Eosinophils Absolute: 0.4 10*3/uL (ref 0.0–0.5)
Eosinophils Relative: 5 %
HCT: 39.1 % (ref 36.0–46.0)
Hemoglobin: 12.4 g/dL (ref 12.0–15.0)
Immature Granulocytes: 0 %
Lymphocytes Relative: 10 %
Lymphs Abs: 0.8 10*3/uL (ref 0.7–4.0)
MCH: 29.2 pg (ref 26.0–34.0)
MCHC: 31.7 g/dL (ref 30.0–36.0)
MCV: 92 fL (ref 80.0–100.0)
Monocytes Absolute: 0.9 10*3/uL (ref 0.1–1.0)
Monocytes Relative: 11 %
Neutro Abs: 6.3 10*3/uL (ref 1.7–7.7)
Neutrophils Relative %: 73 %
Platelets: 262 10*3/uL (ref 150–400)
RBC: 4.25 MIL/uL (ref 3.87–5.11)
RDW: 18 % — ABNORMAL HIGH (ref 11.5–15.5)
WBC: 8.5 10*3/uL (ref 4.0–10.5)
nRBC: 0 % (ref 0.0–0.2)

## 2021-02-15 LAB — COMPREHENSIVE METABOLIC PANEL
ALT: 21 U/L (ref 0–44)
AST: 27 U/L (ref 15–41)
Albumin: 3.7 g/dL (ref 3.5–5.0)
Alkaline Phosphatase: 115 U/L (ref 38–126)
Anion gap: 11 (ref 5–15)
BUN: 15 mg/dL (ref 8–23)
CO2: 29 mmol/L (ref 22–32)
Calcium: 9.2 mg/dL (ref 8.9–10.3)
Chloride: 94 mmol/L — ABNORMAL LOW (ref 98–111)
Creatinine, Ser: 0.8 mg/dL (ref 0.44–1.00)
GFR, Estimated: >60 mL/min (ref >60)
Glucose, Bld: 84 mg/dL (ref 70–99)
Potassium: 3.1 mmol/L — ABNORMAL LOW (ref 3.5–5.1)
Sodium: 134 mmol/L — ABNORMAL LOW (ref 135–145)
Total Bilirubin: 0.5 mg/dL (ref 0.3–1.2)
Total Protein: 6.3 g/dL — ABNORMAL LOW (ref 6.5–8.1)

## 2021-02-15 LAB — LACTATE DEHYDROGENASE: LDH: 156 U/L (ref 98–192)

## 2021-02-15 NOTE — Progress Notes (Signed)
[place] NOTE  Patient Care Team: [name], MD as PCP - General (Internal Medicine) [name], MD as Consulting Physician (Oncology)  CHIEF COMPLAINTS/PURPOSE OF CONSULTATION: Lymphoma Oncology History Overview Note   # Incidental- MAY 2022-CT scan chest abdomen pelvis consistent with bulky retroperitoneal adenopathy [6-8 cm]; subpectoral/axillary lymphadenopathy 2 to 2.5 cm; PET June 2022-bulky right axillary/subpectoral; bulky retroperitoneal adenopathy.  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  Repeat MRI 3-6 m   # coloscopy [2022; [facility]]; mammogram- nov 2021-WNL.   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 .  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  Walking with a rolling walker.   Mary Washington 75 y.o.  female n diffuse large B-cell lymphoma stage III s/p cycle #3 of R-CHOP chemotherapy-currently chemo on hold because of pneumonitis is here for  follow-up.  In the interim patient was evaluated by orthopedics for left knee pain/stiffness.  Improved with Voltaren gel.  Patient is currently on prednisone 10 mg a day/taper.  Also has significant provement of her shortness of breath.  Denies any nausea vomiting abdominal pain.  Reflux is improved.  She continues to be on PPI.  Review of Systems  Constitutional:  Positive for malaise/fatigue. Negative for chills, diaphoresis and fever.  HENT:  Negative for nosebleeds and sore throat.   Eyes:  Negative for double vision.  Respiratory:  Negative for cough, hemoptysis, sputum production, shortness of breath and wheezing.   Cardiovascular:  Negative for chest pain, palpitations, orthopnea and leg swelling.  Gastrointestinal:  Negative for abdominal pain, blood in stool, diarrhea, heartburn, melena, nausea and vomiting.  Genitourinary:  Negative for dysuria, frequency and urgency.  Musculoskeletal:  Positive for joint pain.  Skin: Negative.  Negative for itching and rash.  Neurological:  Negative for dizziness, tingling, focal weakness, weakness and headaches.  Endo/Heme/Allergies:  Does not bruise/bleed easily.  Psychiatric/Behavioral:  Negative for depression. The patient is not nervous/anxious and does not have insomnia.     MEDICAL HISTORY:  Past Medical History:  Diagnosis Date   Colon polyps    Diverticulosis    Melanoma ([place]) 1988   lt upper arm, some lymph nodes removed   Mild intermittent asthma    Osteoporosis     SURGICAL HISTORY: Past Surgical History:  Procedure Laterality Date   BACK SURGERY     CATARACT EXTRACTION     IR IMAGING GUIDED PORT INSERTION  11/23/2020   TONSILLECTOMY  VAGINAL HYSTERECTOMY  1990    SOCIAL HISTORY: Social History   Socioeconomic History   Marital status: Married    Spouse name: Mikki Santee   Number of children: 2   Years of education: Not on file   Highest education level: Not on file  Occupational History   Not on file  Tobacco  Use   Smoking status: Never   Smokeless tobacco: Never  Substance and Sexual Activity   Alcohol use: Never   Drug use: Never   Sexual activity: Yes    Partners: Male  Other Topics Concern   Not on file  Social History Narrative   ** Merged History Encounter **       Lives at home with husband.    Social Determinants of Health   Financial Resource Strain: Not on file  Food Insecurity: Not on file  Transportation Needs: Not on file  Physical Activity: Not on file  Stress: Not on file  Social Connections: Not on file  Intimate Partner Violence: Not on file    FAMILY HISTORY: Family History  Problem Relation Age of Onset   Breast cancer Neg Hx     ALLERGIES:  has No Known Allergies.  MEDICATIONS:  Current Outpatient Medications  Medication Sig Dispense Refill   acetaminophen (TYLENOL) 500 MG tablet Take 500 mg by mouth every 6 (six) hours as needed.     acyclovir (ZOVIRAX) 400 MG tablet Take 1 tablet (400 mg total) by mouth 2 (two) times daily. To prevent shingles 60 tablet 3   allopurinol (ZYLOPRIM) 300 MG tablet Take 1 tablet (300 mg total) by mouth 2 (two) times daily. To avoid gout flare up while on chemo 60 tablet 2   Cholecalciferol (VITAMIN D) 50 MCG (2000 UT) CAPS Take 1 capsule by mouth daily.     lidocaine-prilocaine (EMLA) cream lidocaine-prilocaine 2.5 %-2.5 % topical cream  APPLY TOPICALLY TO THE AFFECTED AREA 1 TIME     Magnesium Oxide (MAG-OXIDE PO) Take 1 capsule by mouth daily at 12 noon.     meloxicam (MOBIC) 15 MG tablet Take 1 tablet by mouth daily.     montelukast (SINGULAIR) 10 MG tablet TAKE 1 TABLET(10 MG) BY MOUTH AT BEDTIME. START 2 DAYS BEFORE INFUSION. TAKE IT FOR 4 DAYS 90 tablet 1   omeprazole (PRILOSEC) 20 MG capsule Take 20 mg by mouth 2 (two) times daily.     ondansetron (ZOFRAN) 8 MG tablet Take 1 tablet (8 mg total) by mouth 2 (two) times daily as needed for refractory nausea / vomiting. Start on day 3 after cyclophosphamide chemotherapy.  30 tablet 1   potassium chloride SA (KLOR-CON) 20 MEQ tablet 1 pill twice a day 60 tablet 1   predniSONE (DELTASONE) 20 MG tablet Take 1 pill a day in the morning with breakfast x 3 more days; and then 1/2 a pill a day. Do not stop until further instructions. 30 tablet 0   prochlorperazine (COMPAZINE) 10 MG tablet Take 1 tablet (10 mg total) by mouth every 6 (six) hours as needed (Nausea or vomiting). 30 tablet 6   traMADol (ULTRAM) 50 MG tablet Take 1 tablet (50 mg total) by mouth every 12 (twelve) hours as needed for moderate pain. 12 tablet 0   benzonatate (TESSALON PERLES) 100 MG capsule Take 1 capsule (100 mg total) by mouth 3 (three) times daily as needed for cough. (Patient not taking: Reported on 02/15/2021) 30 capsule 0   gabapentin (NEURONTIN) 100 MG capsule Take 100 mg by mouth  at bedtime. Not sure of dose (Patient not taking: No sig reported)     methocarbamol (ROBAXIN) 500 MG tablet Take 500 mg by mouth in the morning and at bedtime. (Patient not taking: No sig reported)     predniSONE (DELTASONE) 50 MG tablet Take 2 tablets (100 mg total) by mouth daily with breakfast. (Patient not taking: No sig reported) 10 tablet 4   No current facility-administered medications for this visit.   Facility-Administered Medications Ordered in Other Visits  Medication Dose Route Frequency Provider Last Rate Last Admin   sodium chloride flush (NS) 0.9 % injection 10 mL  10 mL Intravenous PRN [name], MD   10 mL at 01/07/21 0919      .  PHYSICAL EXAMINATION: ECOG PERFORMANCE STATUS: 1 - Symptomatic but completely ambulatory  Vitals:   02/15/21 0936  BP: (!) 141/75  Pulse: (!) 104  Resp: 20  Temp: 97.8 F (36.6 C)  SpO2: 100%   Filed Weights   02/15/21 0936  Weight: 114 lb 12.8 oz (52.1 kg)     Physical Exam HENT:     Head: Normocephalic and atraumatic.     Mouth/Throat:     Pharynx: No oropharyngeal exudate.  Eyes:     Pupils: Pupils are equal, round, and reactive  to light.  Cardiovascular:     Rate and Rhythm: Normal rate and regular rhythm.  Pulmonary:     Effort: Pulmonary effort is normal. No respiratory distress.     Breath sounds: Normal breath sounds. No wheezing.  Abdominal:     General: Bowel sounds are normal. There is no distension.     Palpations: Abdomen is soft. There is no mass.     Tenderness: There is no abdominal tenderness. There is no guarding or rebound.  Musculoskeletal:        General: Tenderness present. Normal range of motion.     Cervical back: Normal range of motion and neck supple.     Left lower leg: Edema present.  Skin:    General: Skin is warm.  Neurological:     Mental Status: She is alert and oriented to person, place, and time.  Psychiatric:        Mood and Affect: Affect normal.     LABORATORY DATA:  I have reviewed the data as listed Lab Results  Component Value Date   WBC 8.5 02/15/2021   HGB 12.4 02/15/2021   HCT 39.1 02/15/2021   MCV 92.0 02/15/2021   PLT 262 02/15/2021   Recent Labs    01/18/21 0803 02/01/21 1019 02/15/21 0910  NA 134* 132* 134*  K 3.0* 3.8 3.1*  CL 93* 95* 94*  CO2 '29 27 29  '$ GLUCOSE 135* 106* 84  BUN '15 16 15  '$ CREATININE 0.83 0.86 0.80  CALCIUM 9.9 9.3 9.2  GFRNONAA >60 >60 >60  PROT 6.1* 6.3* 6.3*  ALBUMIN 3.1* 3.7 3.7  AST '31 25 27  '$ ALT '25 24 21  '$ ALKPHOS 77 90 115  BILITOT 0.6 0.2* 0.5    RADIOGRAPHIC STUDIES: I have personally reviewed the radiological images as listed and agreed with the findings in the report.   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  Recommend starting prednisone 50 mg 2 pills-2 days before chemotherapy; not to take on the day of chemo; for total of 5 days.  # Pneumonitis/-currently on prednisone 10 mg a day [Pred 60 mg started on 8/17 ]-recommend taking every other day for 1 week and then stop.  #Acute on chronic back pain-likely secondary to lymphoma impinging the neuroforaminal nerve- Improved; # Left knee pain/stiff [s/p Evaluation Ortho]- on volatren gel- STABLE.   # PN- 1- G-1- from chemo  # Reflux likely from prednisone. Continue PPI twice daily.  Also Tums as needed-STABLE.   # Hypokalemia: sec to chemo- s/p KCL-today potassium is 3.8; take potassoim  # s/p COVID EVUSHELD PROPHYLAXIS/ vaccinated to [place].   #Gout/shingles prophylaxis- allopurinol/acyclovir-STABLE.  Discontinue allopurinol next visit.  [phone] [home]  # DISPOSITION:  # follow up on 09/26- MD; labs- cbc/cmp/LDH; R-CHOP; D-2 zixetnezo-Dr.B ----------------------------------------------  #Take prednisone 10 mg a day [half a pill] every other day for 1 week; and then stop.  #If the knee pain gets worse-okay to get a cortisone shot with the orthopedics; no contraindications from oncology standpoint.  #Start prednisone 50 mg 2 pills-the morning on 9/24.  Do not take on the day of chemotherapy; take it for 5 days.        All questions were answered. The patient knows to call the clinic with any problems, questions or concerns.    [name], MD 02/15/2021 10:59 AM

## 2021-02-15 NOTE — Patient Instructions (Addendum)
#  Take prednisone 10 mg a day [half a pill] every other day for 1 week; and then stop.  #If the knee pain gets worse-okay to get a cortisone shot with the orthopedics; no contraindications from oncology standpoint.  #Start prednisone 50 mg 2 pills-the morning on 9/24.  Do not take on the day of chemotherapy; take it for 5 days.

## 2021-02-15 NOTE — Assessment & Plan Note (Addendum)
#  Diffuse large B-cell lymphoma- PET June 2022-bulky right axillary/subpectoral; bulky retroperitoneal adenopathy; s/p 3 cycles of R-CHOP; PET AUG 2022--significant clinical/radiological response; however bilateral diffuse groundglass opacities [concerning for treatment induced pneumonitis-see below]  #Hold cycle # 4-R-CHOP given the pneumonitis [see below]; plan to start chemotherapy on 9/26.  Recommend starting prednisone 50 mg 2 pills-2 days before chemotherapy; not to take on the day of chemo; for total of 5 days.  # Pneumonitis/-currently on prednisone 10 mg a day [Pred 60 mg started on 8/17 ]-recommend taking every other day for 1 week and then stop.  #Acute on chronic back pain-likely secondary to lymphoma impinging the neuroforaminal nerve- Improved; # Left knee pain/stiff [s/p Evaluation Ortho]- on volatren gel- STABLE.   # PN- 1- G-1- from chemo  # Reflux likely from prednisone. Continue PPI twice daily.  Also Tums as needed-STABLE.   # Hypokalemia: sec to chemo- s/p KCL-today potassium is 3.8; take potassoim  # s/p COVID EVUSHELD PROPHYLAXIS/ vaccinated to [place].   #Gout/shingles prophylaxis- allopurinol/acyclovir-STABLE.  Discontinue allopurinol next visit.  [phone] [home]  # DISPOSITION:  # follow up on 09/26- MD; labs- cbc/cmp/LDH; R-CHOP; D-2 zixetnezo-Dr.B ----------------------------------------------  #Take prednisone 10 mg a day [half a pill] every other day for 1 week; and then stop.  #If the knee pain gets worse-okay to get a cortisone shot with the orthopedics; no contraindications from oncology standpoint.  #Start prednisone 50 mg 2 pills-the morning on 9/24.  Do not take on the day of chemotherapy; take it for 5 days.

## 2021-02-16 ENCOUNTER — Other Ambulatory Visit: Payer: Self-pay | Admitting: Internal Medicine

## 2021-02-18 ENCOUNTER — Encounter: Payer: Self-pay | Admitting: Internal Medicine

## 2021-02-19 ENCOUNTER — Other Ambulatory Visit: Payer: Self-pay | Admitting: *Deleted

## 2021-02-19 DIAGNOSIS — C8581 Other specified types of non-Hodgkin lymphoma, lymph nodes of head, face, and neck: Secondary | ICD-10-CM

## 2021-02-25 ENCOUNTER — Inpatient Hospital Stay: Payer: Medicare Other

## 2021-02-25 ENCOUNTER — Inpatient Hospital Stay (HOSPITAL_BASED_OUTPATIENT_CLINIC_OR_DEPARTMENT_OTHER): Payer: Medicare Other | Admitting: Internal Medicine

## 2021-02-25 ENCOUNTER — Other Ambulatory Visit: Payer: Self-pay | Admitting: *Deleted

## 2021-02-25 VITALS — BP 137/64 | HR 80 | Temp 98.0°F | Resp 18

## 2021-02-25 DIAGNOSIS — C8581 Other specified types of non-Hodgkin lymphoma, lymph nodes of head, face, and neck: Secondary | ICD-10-CM | POA: Diagnosis not present

## 2021-02-25 DIAGNOSIS — Z5111 Encounter for antineoplastic chemotherapy: Secondary | ICD-10-CM | POA: Diagnosis not present

## 2021-02-25 DIAGNOSIS — Z95828 Presence of other vascular implants and grafts: Secondary | ICD-10-CM

## 2021-02-25 LAB — COMPREHENSIVE METABOLIC PANEL
ALT: 17 U/L (ref 0–44)
AST: 26 U/L (ref 15–41)
Albumin: 3.7 g/dL (ref 3.5–5.0)
Alkaline Phosphatase: 109 U/L (ref 38–126)
Anion gap: 8 (ref 5–15)
BUN: 16 mg/dL (ref 8–23)
CO2: 28 mmol/L (ref 22–32)
Calcium: 9 mg/dL (ref 8.9–10.3)
Chloride: 98 mmol/L (ref 98–111)
Creatinine, Ser: 0.74 mg/dL (ref 0.44–1.00)
GFR, Estimated: >60 mL/min (ref >60)
Glucose, Bld: 126 mg/dL — ABNORMAL HIGH (ref 70–99)
Potassium: 3 mmol/L — ABNORMAL LOW (ref 3.5–5.1)
Sodium: 134 mmol/L — ABNORMAL LOW (ref 135–145)
Total Bilirubin: 0.6 mg/dL (ref 0.3–1.2)
Total Protein: 5.9 g/dL — ABNORMAL LOW (ref 6.5–8.1)

## 2021-02-25 LAB — CBC WITH DIFFERENTIAL/PLATELET
Abs Immature Granulocytes: 0.03 10*3/uL (ref 0.00–0.07)
Basophils Absolute: 0 10*3/uL (ref 0.0–0.1)
Basophils Relative: 0 %
Eosinophils Absolute: 0.1 10*3/uL (ref 0.0–0.5)
Eosinophils Relative: 1 %
HCT: 37.7 % (ref 36.0–46.0)
Hemoglobin: 12 g/dL (ref 12.0–15.0)
Immature Granulocytes: 0 %
Lymphocytes Relative: 19 %
Lymphs Abs: 1.6 10*3/uL (ref 0.7–4.0)
MCH: 29 pg (ref 26.0–34.0)
MCHC: 31.8 g/dL (ref 30.0–36.0)
MCV: 91.1 fL (ref 80.0–100.0)
Monocytes Absolute: 0.9 10*3/uL (ref 0.1–1.0)
Monocytes Relative: 11 %
Neutro Abs: 5.7 10*3/uL (ref 1.7–7.7)
Neutrophils Relative %: 69 %
Platelets: 306 10*3/uL (ref 150–400)
RBC: 4.14 MIL/uL (ref 3.87–5.11)
RDW: 16.9 % — ABNORMAL HIGH (ref 11.5–15.5)
WBC: 8.3 10*3/uL (ref 4.0–10.5)
nRBC: 0 % (ref 0.0–0.2)

## 2021-02-25 LAB — LACTATE DEHYDROGENASE: LDH: 143 U/L (ref 98–192)

## 2021-02-25 MED ORDER — HEPARIN SOD (PORK) LOCK FLUSH 100 UNIT/ML IV SOLN
500.0000 [IU] | Freq: Once | INTRAVENOUS | Status: AC | PRN
  Administered 2021-02-25: 500 [IU]
  Filled 2021-02-25: qty 5

## 2021-02-25 MED ORDER — SODIUM CHLORIDE 0.9 % IV SOLN
150.0000 mg | Freq: Once | INTRAVENOUS | Status: AC
  Administered 2021-02-25: 150 mg via INTRAVENOUS
  Filled 2021-02-25: qty 150

## 2021-02-25 MED ORDER — HEPARIN SOD (PORK) LOCK FLUSH 100 UNIT/ML IV SOLN
500.0000 [IU] | Freq: Once | INTRAVENOUS | Status: DC
  Filled 2021-02-25: qty 5

## 2021-02-25 MED ORDER — DIPHENHYDRAMINE HCL 25 MG PO CAPS
50.0000 mg | ORAL_CAPSULE | Freq: Once | ORAL | Status: AC
  Administered 2021-02-25: 50 mg via ORAL
  Filled 2021-02-25: qty 2

## 2021-02-25 MED ORDER — SODIUM CHLORIDE 0.9 % IV SOLN
375.0000 mg/m2 | Freq: Once | INTRAVENOUS | Status: AC
  Administered 2021-02-25: 600 mg via INTRAVENOUS
  Filled 2021-02-25: qty 50

## 2021-02-25 MED ORDER — SODIUM CHLORIDE 0.9 % IV SOLN
Freq: Once | INTRAVENOUS | Status: AC
  Filled 2021-02-25: qty 250

## 2021-02-25 MED ORDER — VINCRISTINE SULFATE CHEMO INJECTION 1 MG/ML
2.0000 mg | Freq: Once | INTRAVENOUS | Status: AC
  Administered 2021-02-25: 2 mg via INTRAVENOUS
  Filled 2021-02-25: qty 2

## 2021-02-25 MED ORDER — SODIUM CHLORIDE 0.9 % IV SOLN
750.0000 mg/m2 | Freq: Once | INTRAVENOUS | Status: AC
  Administered 2021-02-25: 1160 mg via INTRAVENOUS
  Filled 2021-02-25: qty 50

## 2021-02-25 MED ORDER — PALONOSETRON HCL INJECTION 0.25 MG/5ML
0.2500 mg | Freq: Once | INTRAVENOUS | Status: AC
  Administered 2021-02-25: 0.25 mg via INTRAVENOUS
  Filled 2021-02-25: qty 5

## 2021-02-25 MED ORDER — HEPARIN SOD (PORK) LOCK FLUSH 100 UNIT/ML IV SOLN
INTRAVENOUS | Status: AC
  Filled 2021-02-25: qty 5

## 2021-02-25 MED ORDER — ACETAMINOPHEN 325 MG PO TABS
650.0000 mg | ORAL_TABLET | Freq: Once | ORAL | Status: AC
  Administered 2021-02-25: 650 mg via ORAL
  Filled 2021-02-25: qty 2

## 2021-02-25 MED ORDER — SODIUM CHLORIDE 0.9% FLUSH
10.0000 mL | Freq: Once | INTRAVENOUS | Status: AC
  Administered 2021-02-25: 10 mL via INTRAVENOUS
  Filled 2021-02-25: qty 10

## 2021-02-25 MED ORDER — DOXORUBICIN HCL CHEMO IV INJECTION 2 MG/ML
50.0000 mg/m2 | Freq: Once | INTRAVENOUS | Status: AC
  Administered 2021-02-25: 78 mg via INTRAVENOUS
  Filled 2021-02-25: qty 39

## 2021-02-25 MED ORDER — SODIUM CHLORIDE 0.9 % IV SOLN
10.0000 mg | Freq: Once | INTRAVENOUS | Status: AC
  Administered 2021-02-25: 10 mg via INTRAVENOUS
  Filled 2021-02-25: qty 10

## 2021-02-25 NOTE — Patient Instructions (Signed)
#  Stop on gout medication/allopurinol.

## 2021-02-25 NOTE — Assessment & Plan Note (Addendum)
#  Diffuse large B-cell lymphoma- PET June 2022-bulky right axillary/subpectoral; bulky retroperitoneal adenopathy; s/p 3 cycles of R-CHOP; PET AUG 2022--significant clinical/radiological response; however bilateral diffuse groundglass opacities [concerning for treatment induced pneumonitis-see below]  # Proceed with cycle # 4-R-CHOP [interrupted because of pneumonitis-see below] currently on prednisone 50 mg 2 pills-starting 2 days before chemotherapy; not to take on the day of chemo; for total of 5 days.  # Pneumonitis- ? rituximab improved.-see plan above.  # Hypokalemia: 3.0- increase to 20 BID.   #Acute on chronic back pain-likely secondary to lymphoma impinging the neuroforaminal nerve- Improved; # Left knee pain/stiff [s/p Evaluation Ortho]- on volatren gel-STABLE.   # PN- 1- G-1- from chemo  # Reflux likely from prednisone. Continue PPI twice daily.  Also Tums as needed- STABLE   # s/p COVID EVUSHELD PROPHYLAXIS/ vaccinated to [place] [facility].   #Gout/shingles prophylaxis- STOP allopurinol/continue acyclovir- STABLE.   * [phone] [home]  # DISPOSITION: # Proceed with chemo today # in 10 days- MD: labs- cbc/bmp; possible IVfs 1 hour # follow up in 3 weeks-- MD; labs- cbc/cmp/LDH; R-CHOP; D-2 zixetnezo-Dr.B

## 2021-02-25 NOTE — Progress Notes (Signed)
[facility] NOTE  Patient Care Team: [name], MD as PCP - General (Internal Medicine) [name], MD as Consulting Physician (Oncology)  CHIEF COMPLAINTS/PURPOSE OF CONSULTATION: Lymphoma Oncology History Overview Note   # Incidental- MAY 2022-CT scan chest abdomen pelvis consistent with bulky retroperitoneal adenopathy [6-8 cm]; subpectoral/axillary lymphadenopathy 2 to 2.5 cm; PET June 2022-bulky right axillary/subpectoral; bulky retroperitoneal adenopathy.  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  Repeat MRI 3-6 m   # coloscopy [2022; [facility]]; mammogram- nov 2021-WNL.   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 .  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  Walking with a rolling walker.   [facility] 75 y.o.  female n diffuse large B-cell lymphoma stage III s/p cycle #3 of R-CHOP chemotherapy-currently chemo on hold because of pneumonitis is here for  follow-up.  Patient states her appetite is good.  No weight loss.  She is currently on prednisone 50 mg/premedication started early given previous pneumonitis.  Reflux is improved.  Review of Systems  Constitutional:  Positive for malaise/fatigue. Negative for chills, diaphoresis and fever.  HENT:  Negative for nosebleeds and sore throat.   Eyes:  Negative for double vision.  Respiratory:  Negative for cough, hemoptysis, sputum production, shortness of breath and wheezing.   Cardiovascular:  Negative for chest pain, palpitations, orthopnea and leg swelling.  Gastrointestinal:  Negative for abdominal pain, blood in stool, diarrhea, heartburn, melena, nausea and vomiting.  Genitourinary:  Negative for dysuria, frequency and urgency.  Musculoskeletal:  Positive for joint pain.  Skin: Negative.  Negative for itching and rash.  Neurological:  Negative for dizziness, tingling, focal weakness, weakness and headaches.  Endo/Heme/Allergies:  Does not bruise/bleed easily.  Psychiatric/Behavioral:  Negative for depression. The patient is not nervous/anxious and does not have insomnia.     MEDICAL HISTORY:  Past Medical History:  Diagnosis Date   Colon polyps    Diverticulosis    Melanoma ([place]) 1988   lt upper arm, some lymph nodes removed   Mild intermittent asthma    Osteoporosis     SURGICAL HISTORY: Past Surgical History:  Procedure Laterality Date   BACK SURGERY     CATARACT EXTRACTION     IR IMAGING GUIDED PORT INSERTION  11/23/2020   TONSILLECTOMY     VAGINAL HYSTERECTOMY  1990    SOCIAL HISTORY: Social History   Socioeconomic History   Marital status: Married    Spouse  name: Mikki Santee   Number of children: 2   Years of education: Not on file   Highest education level: Not on file  Occupational History   Not on file  Tobacco Use   Smoking status: Never   Smokeless tobacco: Never  Substance and Sexual Activity   Alcohol use: Never   Drug use: Never   Sexual activity: Yes     Partners: Male  Other Topics Concern   Not on file  Social History Narrative   ** Merged History Encounter **       Lives at home with husband.    Social Determinants of Health   Financial Resource Strain: Not on file  Food Insecurity: Not on file  Transportation Needs: Not on file  Physical Activity: Not on file  Stress: Not on file  Social Connections: Not on file  Intimate Partner Violence: Not on file    FAMILY HISTORY: Family History  Problem Relation Age of Onset   Breast cancer Neg Hx     ALLERGIES:  has No Known Allergies.  MEDICATIONS:  Current Outpatient Medications  Medication Sig Dispense Refill   acetaminophen (TYLENOL) 500 MG tablet Take 500 mg by mouth every 6 (six) hours as needed.     acyclovir (ZOVIRAX) 400 MG tablet Take 1 tablet (400 mg total) by mouth 2 (two) times daily. To prevent shingles 60 tablet 3   allopurinol (ZYLOPRIM) 300 MG tablet TAKE 1 TABLET(300 MG) BY MOUTH TWICE DAILY TO AVOID GOUT FLARE UP WHILE ON CHEMO 60 tablet 2   benzonatate (TESSALON PERLES) 100 MG capsule Take 1 capsule (100 mg total) by mouth 3 (three) times daily as needed for cough. 30 capsule 0   Cholecalciferol (VITAMIN D) 50 MCG (2000 UT) CAPS Take 1 capsule by mouth daily.     lidocaine-prilocaine (EMLA) cream lidocaine-prilocaine 2.5 %-2.5 % topical cream  APPLY TOPICALLY TO THE AFFECTED AREA 1 TIME     Magnesium Oxide (MAG-OXIDE PO) Take 1 capsule by mouth daily at 12 noon.     meloxicam (MOBIC) 15 MG tablet Take 1 tablet by mouth daily.     montelukast (SINGULAIR) 10 MG tablet TAKE 1 TABLET(10 MG) BY MOUTH AT BEDTIME. START 2 DAYS BEFORE INFUSION. TAKE IT FOR 4 DAYS 90 tablet 1   omeprazole (PRILOSEC) 20 MG capsule Take 20 mg by mouth 2 (two) times daily.     potassium chloride SA (KLOR-CON) 20 MEQ tablet 1 pill twice a day 60 tablet 1   predniSONE (DELTASONE) 50 MG tablet Take 2 tablets (100 mg total) by mouth daily with breakfast. 10 tablet 4   traMADol (ULTRAM) 50  MG tablet Take 1 tablet (50 mg total) by mouth every 12 (twelve) hours as needed for moderate pain. 12 tablet 0   gabapentin (NEURONTIN) 100 MG capsule Take 100 mg by mouth at bedtime. Not sure of dose (Patient not taking: No sig reported)     methocarbamol (ROBAXIN) 500 MG tablet Take 500 mg by mouth in the morning and at bedtime. (Patient not taking: No sig reported)     ondansetron (ZOFRAN) 8 MG tablet Take 1 tablet (8 mg total) by mouth 2 (two) times daily as needed for refractory nausea / vomiting. Start on day 3 after cyclophosphamide chemotherapy. 30 tablet 1   predniSONE (DELTASONE) 20 MG tablet Take 1 pill a day in the morning with breakfast x 3 more days; and then 1/2 a pill a day. Do not stop until further instructions. (Patient not taking:  Reported on 02/25/2021) 30 tablet 0   prochlorperazine (COMPAZINE) 10 MG tablet Take 1 tablet (10 mg total) by mouth every 6 (six) hours as needed (Nausea or vomiting). 30 tablet 6   No current facility-administered medications for this visit.   Facility-Administered Medications Ordered in Other Visits  Medication Dose Route Frequency Provider Last Rate Last Admin   sodium chloride flush (NS) 0.9 % injection 10 mL  10 mL Intravenous PRN [name], MD   10 mL at 01/07/21 0919      .  PHYSICAL EXAMINATION: ECOG PERFORMANCE STATUS: 1 - Symptomatic but completely ambulatory  Vitals:   02/25/21 0856  BP: (!) 146/61  Pulse: 86  Resp: 18  Temp: 98.1 F (36.7 C)  SpO2: 100%   Filed Weights   02/25/21 0856  Weight: 116 lb (52.6 kg)     Physical Exam HENT:     Head: Normocephalic and atraumatic.     Mouth/Throat:     Pharynx: No oropharyngeal exudate.  Eyes:     Pupils: Pupils are equal, round, and reactive to light.  Cardiovascular:     Rate and Rhythm: Normal rate and regular rhythm.  Pulmonary:     Effort: Pulmonary effort is normal. No respiratory distress.     Breath sounds: Normal breath sounds. No wheezing.   Abdominal:     General: Bowel sounds are normal. There is no distension.     Palpations: Abdomen is soft. There is no mass.     Tenderness: There is no abdominal tenderness. There is no guarding or rebound.  Musculoskeletal:        General: Tenderness present. Normal range of motion.     Cervical back: Normal range of motion and neck supple.     Left lower leg: Edema present.  Skin:    General: Skin is warm.  Neurological:     Mental Status: She is alert and oriented to person, place, and time.  Psychiatric:        Mood and Affect: Affect normal.     LABORATORY DATA:  I have reviewed the data as listed Lab Results  Component Value Date   WBC 8.3 02/25/2021   HGB 12.0 02/25/2021   HCT 37.7 02/25/2021   MCV 91.1 02/25/2021   PLT 306 02/25/2021   Recent Labs    02/01/21 1019 02/15/21 0910 02/25/21 0834  NA 132* 134* 134*  K 3.8 3.1* 3.0*  CL 95* 94* 98  CO2 27 29 28   GLUCOSE 106* 84 126*  BUN 16 15 16   CREATININE 0.86 0.80 0.74  CALCIUM 9.3 9.2 9.0  GFRNONAA >60 >60 >60  PROT 6.3* 6.3* 5.9*  ALBUMIN 3.7 3.7 3.7  AST 25 27 26   ALT 24 21 17   ALKPHOS 90 115 109  BILITOT 0.2* 0.5 0.6    RADIOGRAPHIC STUDIES: I have personally reviewed the radiological images as listed and agreed with the findings in the report.   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  # Pneumonitis- ? rituximab improved.-see plan above.  # Hypokalemia:  3.0-  increase to 20 BID.   #Acute on chronic back pain-likely secondary to lymphoma impinging the neuroforaminal nerve- Improved; # Left knee pain/stiff [s/p Evaluation Ortho]- on volatren gel-STABLE.   # PN- 1- G-1- from chemo  # Reflux likely from prednisone. Continue PPI twice daily.  Also Tums as needed- STABLE   # s/p COVID EVUSHELD PROPHYLAXIS/ vaccinated to [place].   #Gout/shingles prophylaxis- STOP allopurinol/continue acyclovir- STABLE.   * [phone] [home]  # DISPOSITION: # Proceed with chemo today # in 10 days- MD: labs- cbc/bmp; possible IVfs 1 hour # follow up in 3 weeks-- MD; labs- cbc/cmp/LDH; R-CHOP; D-2 [email or web address]             All questions were answered. The patient knows to call the clinic with any problems, questions or concerns.    [name], MD 02/25/2021 9:27 AM

## 2021-02-25 NOTE — Patient Instructions (Signed)
[facility] ONCOLOGY  Discharge Instructions: Thank you for choosing [facility] to provide your oncology and hematology care.  If you have a lab appointment with the [facility], please go directly to the [facility] and check in at the registration area.  Wear comfortable clothing and clothing appropriate for easy access to any Portacath or PICC line.   We strive to give you quality time with your provider. You may need to reschedule your appointment if you arrive late (15 or more minutes).  Arriving late affects you and other patients whose appointments are after yours.  Also, if you miss three or more appointments without notifying the office, you may be dismissed from the clinic at the provider's discretion.      For prescription refill requests, have your pharmacy contact our office and allow 72 hours for refills to be completed.    Today you received the following chemotherapy and/or immunotherapy agents Adriamycin, vincristine, cytoxan, ruxience    To help prevent nausea and vomiting after your treatment, we encourage you to take your nausea medication as directed.  BELOW ARE SYMPTOMS THAT SHOULD BE REPORTED IMMEDIATELY: *FEVER GREATER THAN 100.4 F (38 C) OR HIGHER *CHILLS OR SWEATING *NAUSEA AND VOMITING THAT IS NOT CONTROLLED WITH YOUR NAUSEA MEDICATION *UNUSUAL SHORTNESS OF BREATH *UNUSUAL BRUISING OR BLEEDING *URINARY PROBLEMS (pain or burning when urinating, or frequent urination) *BOWEL PROBLEMS (unusual diarrhea, constipation, pain near the anus) TENDERNESS IN MOUTH AND THROAT WITH OR WITHOUT PRESENCE OF ULCERS (sore throat, sores in mouth, or a toothache) UNUSUAL RASH, SWELLING OR PAIN  UNUSUAL VAGINAL DISCHARGE OR ITCHING   Items with * indicate a potential emergency and should be followed up as soon as possible or go to the Emergency Department if any problems should occur.  Please show the CHEMOTHERAPY ALERT CARD or  IMMUNOTHERAPY ALERT CARD at check-in to the Emergency Department and triage nurse.  Should you have questions after your visit or need to cancel or reschedule your appointment, please contact [facility]  [phone] and follow the prompts.  Office hours are 8:00 a.m. to 4:30 p.m. Monday - Friday. Please note that voicemails left after 4:00 p.m. may not be returned until the following business day.  We are closed weekends and major holidays. You have access to a nurse at all times for urgent questions. Please call the main number to the clinic [phone] and follow the prompts.  For any non-urgent questions, you may also contact your provider using MyChart. We now offer e-Visits for anyone 63 and older to request care online for non-urgent symptoms. For details visit [email or web address].   Also download the MyChart app! Go to the app store, search "MyChart", open the app, select [facility], and log in with your MyChart username and password.  Due to Covid, a mask is required upon entering the hospital/clinic. If you do not have a mask, one will be given to you upon arrival. For doctor visits, patients may have 1 support person aged 50 or older with them. For treatment visits, patients cannot have anyone with them due to current Covid guidelines and our immunocompromised population.

## 2021-02-25 NOTE — Progress Notes (Signed)
No concerns to report. She reports a good appetite and is gaining weight.

## 2021-02-26 ENCOUNTER — Inpatient Hospital Stay: Payer: Medicare Other

## 2021-02-26 DIAGNOSIS — C8581 Other specified types of non-Hodgkin lymphoma, lymph nodes of head, face, and neck: Secondary | ICD-10-CM

## 2021-02-26 DIAGNOSIS — Z5111 Encounter for antineoplastic chemotherapy: Secondary | ICD-10-CM | POA: Diagnosis not present

## 2021-02-26 MED ORDER — PEGFILGRASTIM-BMEZ 6 MG/0.6ML ~~LOC~~ SOSY
6.0000 mg | PREFILLED_SYRINGE | Freq: Once | SUBCUTANEOUS | Status: AC
  Administered 2021-02-26: 6 mg via SUBCUTANEOUS
  Filled 2021-02-26: qty 0.6

## 2021-03-01 ENCOUNTER — Other Ambulatory Visit: Payer: Self-pay | Admitting: *Deleted

## 2021-03-01 DIAGNOSIS — C8581 Other specified types of non-Hodgkin lymphoma, lymph nodes of head, face, and neck: Secondary | ICD-10-CM

## 2021-03-07 ENCOUNTER — Inpatient Hospital Stay: Payer: Medicare Other | Attending: Internal Medicine

## 2021-03-07 ENCOUNTER — Inpatient Hospital Stay (HOSPITAL_BASED_OUTPATIENT_CLINIC_OR_DEPARTMENT_OTHER): Payer: Medicare Other | Admitting: Internal Medicine

## 2021-03-07 ENCOUNTER — Encounter: Payer: Self-pay | Admitting: Internal Medicine

## 2021-03-07 ENCOUNTER — Inpatient Hospital Stay: Payer: Medicare Other

## 2021-03-07 DIAGNOSIS — Z79899 Other long term (current) drug therapy: Secondary | ICD-10-CM | POA: Insufficient documentation

## 2021-03-07 DIAGNOSIS — C8581 Other specified types of non-Hodgkin lymphoma, lymph nodes of head, face, and neck: Secondary | ICD-10-CM

## 2021-03-07 DIAGNOSIS — G629 Polyneuropathy, unspecified: Secondary | ICD-10-CM | POA: Insufficient documentation

## 2021-03-07 DIAGNOSIS — M25562 Pain in left knee: Secondary | ICD-10-CM | POA: Insufficient documentation

## 2021-03-07 DIAGNOSIS — G8929 Other chronic pain: Secondary | ICD-10-CM | POA: Insufficient documentation

## 2021-03-07 DIAGNOSIS — C8338 Diffuse large B-cell lymphoma, lymph nodes of multiple sites: Secondary | ICD-10-CM | POA: Diagnosis present

## 2021-03-07 DIAGNOSIS — E876 Hypokalemia: Secondary | ICD-10-CM | POA: Insufficient documentation

## 2021-03-07 DIAGNOSIS — E871 Hypo-osmolality and hyponatremia: Secondary | ICD-10-CM | POA: Diagnosis not present

## 2021-03-07 DIAGNOSIS — Z5111 Encounter for antineoplastic chemotherapy: Secondary | ICD-10-CM | POA: Insufficient documentation

## 2021-03-07 DIAGNOSIS — M549 Dorsalgia, unspecified: Secondary | ICD-10-CM | POA: Insufficient documentation

## 2021-03-07 DIAGNOSIS — K219 Gastro-esophageal reflux disease without esophagitis: Secondary | ICD-10-CM | POA: Insufficient documentation

## 2021-03-07 LAB — CBC WITH DIFFERENTIAL/PLATELET
Abs Immature Granulocytes: 1.05 10*3/uL — ABNORMAL HIGH (ref 0.00–0.07)
Basophils Absolute: 0 10*3/uL (ref 0.0–0.1)
Basophils Relative: 0 %
Eosinophils Absolute: 0.1 10*3/uL (ref 0.0–0.5)
Eosinophils Relative: 1 %
HCT: 36.5 % (ref 36.0–46.0)
Hemoglobin: 11.9 g/dL — ABNORMAL LOW (ref 12.0–15.0)
Immature Granulocytes: 11 %
Lymphocytes Relative: 10 %
Lymphs Abs: 1 10*3/uL (ref 0.7–4.0)
MCH: 29.2 pg (ref 26.0–34.0)
MCHC: 32.6 g/dL (ref 30.0–36.0)
MCV: 89.7 fL (ref 80.0–100.0)
Monocytes Absolute: 0.9 10*3/uL (ref 0.1–1.0)
Monocytes Relative: 9 %
Neutro Abs: 6.7 10*3/uL (ref 1.7–7.7)
Neutrophils Relative %: 69 %
Platelets: 327 10*3/uL (ref 150–400)
RBC: 4.07 MIL/uL (ref 3.87–5.11)
RDW: 15.8 % — ABNORMAL HIGH (ref 11.5–15.5)
Smear Review: NORMAL
WBC: 9.7 10*3/uL (ref 4.0–10.5)
nRBC: 0 % (ref 0.0–0.2)

## 2021-03-07 LAB — BASIC METABOLIC PANEL
Anion gap: 9 (ref 5–15)
BUN: 13 mg/dL (ref 8–23)
CO2: 26 mmol/L (ref 22–32)
Calcium: 8.9 mg/dL (ref 8.9–10.3)
Chloride: 95 mmol/L — ABNORMAL LOW (ref 98–111)
Creatinine, Ser: 0.77 mg/dL (ref 0.44–1.00)
GFR, Estimated: >60 mL/min (ref >60)
Glucose, Bld: 132 mg/dL — ABNORMAL HIGH (ref 70–99)
Potassium: 3.7 mmol/L (ref 3.5–5.1)
Sodium: 130 mmol/L — ABNORMAL LOW (ref 135–145)

## 2021-03-07 MED ORDER — SODIUM CHLORIDE 0.9% FLUSH
10.0000 mL | INTRAVENOUS | Status: DC | PRN
  Administered 2021-03-07: 10 mL via INTRAVENOUS
  Filled 2021-03-07: qty 10

## 2021-03-07 MED ORDER — HEPARIN SOD (PORK) LOCK FLUSH 100 UNIT/ML IV SOLN
500.0000 [IU] | Freq: Once | INTRAVENOUS | Status: AC
  Administered 2021-03-07: 500 [IU] via INTRAVENOUS
  Filled 2021-03-07: qty 5

## 2021-03-07 NOTE — Progress Notes (Signed)
[facility] NOTE  Patient Care Team: [name], MD as PCP - General (Internal Medicine) [name], MD as Consulting Physician (Oncology)  CHIEF COMPLAINTS/PURPOSE OF CONSULTATION: Lymphoma Oncology History Overview Note   # Incidental- MAY 2022-CT scan chest abdomen pelvis consistent with bulky retroperitoneal adenopathy [6-8 cm]; subpectoral/axillary lymphadenopathy 2 to 2.5 cm; PET June 2022-bulky right axillary/subpectoral; bulky retroperitoneal adenopathy.  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  Repeat MRI 3-6 m   # coloscopy [2022; [facility]]; mammogram- nov 2021-WNL.   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 .  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  Walking with a rolling walker.   [facility] 75 y.o.  female n diffuse large B-cell lymphoma stage III s/p cycle #4 of R-CHOP chemotherapy-approximately 10 days ago is here for a follow-up.  Patient cycle #4  was delayed because of pneumonitis secondary from possible rituximab.  Patient tolerated chemotherapy #4 fairly well.  No new shortness of breath or cough.  Appetite is good.  She is walking with a walker.   Review of Systems  Constitutional:  Positive for malaise/fatigue. Negative for chills, diaphoresis and fever.  HENT:  Negative for nosebleeds and sore throat.   Eyes:  Negative for double vision.  Respiratory:  Negative for cough, hemoptysis, sputum production, shortness of breath and wheezing.   Cardiovascular:  Negative for chest pain, palpitations, orthopnea and leg swelling.  Gastrointestinal:  Negative for abdominal pain, blood in stool, diarrhea, heartburn, melena, nausea and vomiting.  Genitourinary:  Negative for dysuria, frequency and urgency.  Musculoskeletal:  Positive for joint pain.  Skin: Negative.  Negative for itching and rash.  Neurological:  Negative for dizziness, tingling, focal weakness, weakness and headaches.  Endo/Heme/Allergies:  Does not bruise/bleed easily.  Psychiatric/Behavioral:  Negative for depression. The patient is not nervous/anxious and does not have insomnia.     MEDICAL HISTORY:  Past Medical History:  Diagnosis Date   Colon polyps    Diverticulosis    Melanoma ([place]) 1988   lt upper arm, some lymph nodes removed   Mild intermittent asthma    Osteoporosis     SURGICAL HISTORY: Past Surgical History:  Procedure Laterality Date   BACK SURGERY     CATARACT EXTRACTION     IR IMAGING GUIDED PORT INSERTION  11/23/2020   TONSILLECTOMY     VAGINAL HYSTERECTOMY  1990    SOCIAL HISTORY: Social History   Socioeconomic History  Marital status: Married    Spouse name: Mikki Santee   Number of children: 2   Years of education: Not on file   Highest education level: Not on file  Occupational History   Not on file  Tobacco Use   Smoking status: Never   Smokeless tobacco: Never  Substance and Sexual Activity   Alcohol use: Never   Drug use: Never    Sexual activity: Yes    Partners: Male  Other Topics Concern   Not on file  Social History Narrative   ** Merged History Encounter **       Lives at home with husband.    Social Determinants of Health   Financial Resource Strain: Not on file  Food Insecurity: Not on file  Transportation Needs: Not on file  Physical Activity: Not on file  Stress: Not on file  Social Connections: Not on file  Intimate Partner Violence: Not on file    FAMILY HISTORY: Family History  Problem Relation Age of Onset   Breast cancer Neg Hx     ALLERGIES:  has No Known Allergies.  MEDICATIONS:  Current Outpatient Medications  Medication Sig Dispense Refill   acetaminophen (TYLENOL) 500 MG tablet Take 500 mg by mouth every 6 (six) hours as needed.     acyclovir (ZOVIRAX) 400 MG tablet Take 1 tablet (400 mg total) by mouth 2 (two) times daily. To prevent shingles 60 tablet 3   Cholecalciferol (VITAMIN D) 50 MCG (2000 UT) CAPS Take 1 capsule by mouth daily.     lidocaine-prilocaine (EMLA) cream lidocaine-prilocaine 2.5 %-2.5 % topical cream  APPLY TOPICALLY TO THE AFFECTED AREA 1 TIME     Magnesium Oxide (MAG-OXIDE PO) Take 1 capsule by mouth daily at 12 noon.     meloxicam (MOBIC) 15 MG tablet Take 1 tablet by mouth daily.     montelukast (SINGULAIR) 10 MG tablet TAKE 1 TABLET(10 MG) BY MOUTH AT BEDTIME. START 2 DAYS BEFORE INFUSION. TAKE IT FOR 4 DAYS 90 tablet 1   omeprazole (PRILOSEC) 20 MG capsule Take 20 mg by mouth 2 (two) times daily.     potassium chloride SA (KLOR-CON) 20 MEQ tablet 1 pill twice a day 60 tablet 1   allopurinol (ZYLOPRIM) 300 MG tablet TAKE 1 TABLET(300 MG) BY MOUTH TWICE DAILY TO AVOID GOUT FLARE UP WHILE ON CHEMO (Patient not taking: Reported on 03/07/2021) 60 tablet 2   benzonatate (TESSALON PERLES) 100 MG capsule Take 1 capsule (100 mg total) by mouth 3 (three) times daily as needed for cough. (Patient not taking: Reported on 03/07/2021) 30 capsule 0   ondansetron  (ZOFRAN) 8 MG tablet Take 1 tablet (8 mg total) by mouth 2 (two) times daily as needed for refractory nausea / vomiting. Start on day 3 after cyclophosphamide chemotherapy. (Patient not taking: Reported on 03/07/2021) 30 tablet 1   predniSONE (DELTASONE) 20 MG tablet Take 1 pill a day in the morning with breakfast x 3 more days; and then 1/2 a pill a day. Do not stop until further instructions. (Patient not taking: No sig reported) 30 tablet 0   predniSONE (DELTASONE) 50 MG tablet Take 2 tablets (100 mg total) by mouth daily with breakfast. (Patient not taking: Reported on 03/07/2021) 10 tablet 4   prochlorperazine (COMPAZINE) 10 MG tablet Take 1 tablet (10 mg total) by mouth every 6 (six) hours as needed (Nausea or vomiting). (Patient not taking: Reported on 03/07/2021) 30 tablet 6   traMADol (ULTRAM) 50 MG tablet Take 1  tablet (50 mg total) by mouth every 12 (twelve) hours as needed for moderate pain. (Patient not taking: Reported on 03/07/2021) 12 tablet 0   No current facility-administered medications for this visit.   Facility-Administered Medications Ordered in Other Visits  Medication Dose Route Frequency Provider Last Rate Last Admin   sodium chloride flush (NS) 0.9 % injection 10 mL  10 mL Intravenous PRN [name], MD   10 mL at 01/07/21 0919      .  PHYSICAL EXAMINATION: ECOG PERFORMANCE STATUS: 1 - Symptomatic but completely ambulatory  Vitals:   03/07/21 0916  BP: 138/70  Pulse: (!) 105  Resp: 16  Temp: 98 F (36.7 C)  SpO2: 100%   Filed Weights   03/07/21 0916  Weight: 113 lb (51.3 kg)     Physical Exam HENT:     Head: Normocephalic and atraumatic.     Mouth/Throat:     Pharynx: No oropharyngeal exudate.  Eyes:     Pupils: Pupils are equal, round, and reactive to light.  Cardiovascular:     Rate and Rhythm: Normal rate and regular rhythm.  Pulmonary:     Effort: Pulmonary effort is normal. No respiratory distress.     Breath sounds: Normal breath  sounds. No wheezing.  Abdominal:     General: Bowel sounds are normal. There is no distension.     Palpations: Abdomen is soft. There is no mass.     Tenderness: There is no abdominal tenderness. There is no guarding or rebound.  Musculoskeletal:        General: Tenderness present. Normal range of motion.     Cervical back: Normal range of motion and neck supple.     Left lower leg: Edema present.  Skin:    General: Skin is warm.  Neurological:     Mental Status: She is alert and oriented to person, place, and time.  Psychiatric:        Mood and Affect: Affect normal.     LABORATORY DATA:  I have reviewed the data as listed Lab Results  Component Value Date   WBC 9.7 03/07/2021   HGB 11.9 (L) 03/07/2021   HCT 36.5 03/07/2021   MCV 89.7 03/07/2021   PLT 327 03/07/2021   Recent Labs    02/01/21 1019 02/15/21 0910 02/25/21 0834 03/07/21 0850  NA 132* 134* 134* 130*  K 3.8 3.1* 3.0* 3.7  CL 95* 94* 98 95*  CO2 27 29 28 26   GLUCOSE 106* 84 126* 132*  BUN 16 15 16 13   CREATININE 0.86 0.80 0.74 0.77  CALCIUM 9.3 9.2 9.0 8.9  GFRNONAA >60 >60 >60 >60  PROT 6.3* 6.3* 5.9*  --   ALBUMIN 3.7 3.7 3.7  --   AST 25 27 26   --   ALT 24 21 17   --   ALKPHOS 90 115 109  --   BILITOT 0.2* 0.5 0.6  --     RADIOGRAPHIC STUDIES: I have personally reviewed the radiological images as listed and agreed with the findings in the report.   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  Tolerated cycle #4 without any respiratory issues.  # Pneumonitis-recheck #3-? rituximab improved- monitor  for  now.   # Hypokalemia: 3.7-continue KDur 20 BID.   #Acute on chronic back pain-likely secondary to lymphoma impinging the neuroforaminal nerve- Improved; # Left knee pain/stiff [s/p Evaluation Ortho]- on volatren gel- STABLE.   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   # Reflux likely from prednisone. Continue PPI twice daily.  Also Tums as needed- STABLE.   # s/p COVID EVUSHELD PROPHYLAXIS/ vaccinated to [place].    # Gout/shingles prophylaxis- STOP allopurinol/continue acyclovir-stable  * [phone] [home]  # DISPOSITION:de-access # follow up as planned/MD/alsb- chemo--Dr.B             All questions were answered. The patient knows to call the clinic with any problems, questions or concerns.    [name], MD 03/07/2021 7:43 PM

## 2021-03-07 NOTE — Assessment & Plan Note (Addendum)
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  Tolerated cycle #4 without any respiratory issues.  # Pneumonitis-recheck #3-? rituximab improved- monitor for now.   # Hypokalemia: 3.7-continue KDur 20 BID.   #Acute on chronic back pain-likely secondary to lymphoma impinging the neuroforaminal nerve- Improved; # Left knee pain/stiff [s/p Evaluation Ortho]- on volatren gel- STABLE.   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   # Reflux likely from prednisone. Continue PPI twice daily.  Also Tums as needed- STABLE.   # s/p COVID EVUSHELD PROPHYLAXIS/ vaccinated to [place].    # Gout/shingles prophylaxis- STOP allopurinol/continue acyclovir-stable  * [phone] [home]  # DISPOSITION:de-access # follow up as planned/MD/alsb- chemo--Dr.B

## 2021-03-07 NOTE — Progress Notes (Signed)
Pt in for follow up, states had injection to left knee Tuesday for arthritis pain.

## 2021-03-16 ENCOUNTER — Other Ambulatory Visit: Payer: Self-pay | Admitting: Internal Medicine

## 2021-03-18 ENCOUNTER — Inpatient Hospital Stay: Payer: Medicare Other

## 2021-03-18 ENCOUNTER — Inpatient Hospital Stay (HOSPITAL_BASED_OUTPATIENT_CLINIC_OR_DEPARTMENT_OTHER): Payer: Medicare Other | Admitting: Internal Medicine

## 2021-03-18 ENCOUNTER — Encounter: Payer: Self-pay | Admitting: Internal Medicine

## 2021-03-18 ENCOUNTER — Other Ambulatory Visit: Payer: Self-pay

## 2021-03-18 VITALS — BP 138/67 | HR 90

## 2021-03-18 DIAGNOSIS — Z5111 Encounter for antineoplastic chemotherapy: Secondary | ICD-10-CM | POA: Diagnosis not present

## 2021-03-18 DIAGNOSIS — C8581 Other specified types of non-Hodgkin lymphoma, lymph nodes of head, face, and neck: Secondary | ICD-10-CM

## 2021-03-18 DIAGNOSIS — Z95828 Presence of other vascular implants and grafts: Secondary | ICD-10-CM

## 2021-03-18 LAB — COMPREHENSIVE METABOLIC PANEL
ALT: 16 U/L (ref 0–44)
AST: 23 U/L (ref 15–41)
Albumin: 3.8 g/dL (ref 3.5–5.0)
Alkaline Phosphatase: 95 U/L (ref 38–126)
Anion gap: 8 (ref 5–15)
BUN: 16 mg/dL (ref 8–23)
CO2: 26 mmol/L (ref 22–32)
Calcium: 9 mg/dL (ref 8.9–10.3)
Chloride: 98 mmol/L (ref 98–111)
Creatinine, Ser: 0.64 mg/dL (ref 0.44–1.00)
GFR, Estimated: >60 mL/min (ref >60)
Glucose, Bld: 126 mg/dL — ABNORMAL HIGH (ref 70–99)
Potassium: 3.5 mmol/L (ref 3.5–5.1)
Sodium: 132 mmol/L — ABNORMAL LOW (ref 135–145)
Total Bilirubin: 0.1 mg/dL — ABNORMAL LOW (ref 0.3–1.2)
Total Protein: 6.3 g/dL — ABNORMAL LOW (ref 6.5–8.1)

## 2021-03-18 LAB — CBC WITH DIFFERENTIAL/PLATELET
Abs Immature Granulocytes: 0.19 10*3/uL — ABNORMAL HIGH (ref 0.00–0.07)
Basophils Absolute: 0.1 10*3/uL (ref 0.0–0.1)
Basophils Relative: 1 %
Eosinophils Absolute: 0 10*3/uL (ref 0.0–0.5)
Eosinophils Relative: 0 %
HCT: 37.3 % (ref 36.0–46.0)
Hemoglobin: 12 g/dL (ref 12.0–15.0)
Immature Granulocytes: 2 %
Lymphocytes Relative: 16 %
Lymphs Abs: 1.8 10*3/uL (ref 0.7–4.0)
MCH: 28.8 pg (ref 26.0–34.0)
MCHC: 32.2 g/dL (ref 30.0–36.0)
MCV: 89.7 fL (ref 80.0–100.0)
Monocytes Absolute: 1.3 10*3/uL — ABNORMAL HIGH (ref 0.1–1.0)
Monocytes Relative: 12 %
Neutro Abs: 7.6 10*3/uL (ref 1.7–7.7)
Neutrophils Relative %: 69 %
Platelets: 464 10*3/uL — ABNORMAL HIGH (ref 150–400)
RBC: 4.16 MIL/uL (ref 3.87–5.11)
RDW: 15.9 % — ABNORMAL HIGH (ref 11.5–15.5)
WBC: 11 10*3/uL — ABNORMAL HIGH (ref 4.0–10.5)
nRBC: 0 % (ref 0.0–0.2)

## 2021-03-18 LAB — LACTATE DEHYDROGENASE: LDH: 188 U/L (ref 98–192)

## 2021-03-18 MED ORDER — SODIUM CHLORIDE 0.9 % IV SOLN
750.0000 mg/m2 | Freq: Once | INTRAVENOUS | Status: AC
  Administered 2021-03-18: 1160 mg via INTRAVENOUS
  Filled 2021-03-18: qty 50

## 2021-03-18 MED ORDER — DIPHENHYDRAMINE HCL 25 MG PO CAPS
50.0000 mg | ORAL_CAPSULE | Freq: Once | ORAL | Status: AC
  Administered 2021-03-18: 50 mg via ORAL
  Filled 2021-03-18: qty 2

## 2021-03-18 MED ORDER — PALONOSETRON HCL INJECTION 0.25 MG/5ML
0.2500 mg | Freq: Once | INTRAVENOUS | Status: AC
  Administered 2021-03-18: 0.25 mg via INTRAVENOUS
  Filled 2021-03-18: qty 5

## 2021-03-18 MED ORDER — SODIUM CHLORIDE 0.9 % IV SOLN
150.0000 mg | Freq: Once | INTRAVENOUS | Status: AC
  Administered 2021-03-18: 150 mg via INTRAVENOUS
  Filled 2021-03-18: qty 150

## 2021-03-18 MED ORDER — ACETAMINOPHEN 325 MG PO TABS
650.0000 mg | ORAL_TABLET | Freq: Once | ORAL | Status: AC
  Administered 2021-03-18: 650 mg via ORAL
  Filled 2021-03-18: qty 2

## 2021-03-18 MED ORDER — HEPARIN SOD (PORK) LOCK FLUSH 100 UNIT/ML IV SOLN
500.0000 [IU] | Freq: Once | INTRAVENOUS | Status: AC | PRN
  Administered 2021-03-18: 500 [IU]
  Filled 2021-03-18: qty 5

## 2021-03-18 MED ORDER — DOXORUBICIN HCL CHEMO IV INJECTION 2 MG/ML
50.0000 mg/m2 | Freq: Once | INTRAVENOUS | Status: AC
  Administered 2021-03-18: 78 mg via INTRAVENOUS
  Filled 2021-03-18: qty 39
  Filled 2021-03-18: qty 25

## 2021-03-18 MED ORDER — HEPARIN SOD (PORK) LOCK FLUSH 100 UNIT/ML IV SOLN
500.0000 [IU] | Freq: Once | INTRAVENOUS | Status: DC
  Filled 2021-03-18: qty 5

## 2021-03-18 MED ORDER — SODIUM CHLORIDE 0.9 % IV SOLN
10.0000 mg | Freq: Once | INTRAVENOUS | Status: AC
  Administered 2021-03-18: 10 mg via INTRAVENOUS
  Filled 2021-03-18: qty 10

## 2021-03-18 MED ORDER — SODIUM CHLORIDE 0.9 % IV SOLN
375.0000 mg/m2 | Freq: Once | INTRAVENOUS | Status: AC
  Administered 2021-03-18: 600 mg via INTRAVENOUS
  Filled 2021-03-18: qty 50

## 2021-03-18 MED ORDER — VINCRISTINE SULFATE CHEMO INJECTION 1 MG/ML
2.0000 mg | Freq: Once | INTRAVENOUS | Status: AC
  Administered 2021-03-18: 2 mg via INTRAVENOUS
  Filled 2021-03-18: qty 2

## 2021-03-18 MED ORDER — SODIUM CHLORIDE 0.9% FLUSH
10.0000 mL | Freq: Once | INTRAVENOUS | Status: AC
  Administered 2021-03-18: 10 mL via INTRAVENOUS
  Filled 2021-03-18: qty 10

## 2021-03-18 MED ORDER — SODIUM CHLORIDE 0.9 % IV SOLN
Freq: Once | INTRAVENOUS | Status: AC
  Filled 2021-03-18: qty 250

## 2021-03-18 NOTE — Telephone Encounter (Signed)
Notes Component Ref Range & Units 08:17  (03/18/21) 11 d ago  (03/07/21) 3 wk ago  (02/25/21)  Potassium 3.5 - 5.1 mmol/L 3.5  3.7  3.0 Low

## 2021-03-18 NOTE — Progress Notes (Signed)
[facility] NOTE  Patient Care Team: [name], MD as PCP - General (Internal Medicine) [name], MD as Consulting Physician (Oncology)  CHIEF COMPLAINTS/PURPOSE OF CONSULTATION: Lymphoma Oncology History Overview Note   # Incidental- MAY 2022-CT scan chest abdomen pelvis consistent with bulky retroperitoneal adenopathy [6-8 cm]; subpectoral/axillary lymphadenopathy 2 to 2.5 cm; PET June 2022-bulky right axillary/subpectoral; bulky retroperitoneal adenopathy.  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  Repeat MRI 3-6 m   # coloscopy [2022; [facility]]; mammogram- nov 2021-WNL.   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 .  # SURVIVORSHIP:   # GENETICS:   DIAGNOSIS:   STAGE:         ;  GOALS:  CURRENT/MOST RECENT THERAPY :     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 ([place])  11/21/2020 Initial Diagnosis   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 ([place])   11/28/2020 -  Chemotherapy   Patient is on Treatment Plan : NON-HODGKINS LYMPHOMA R-CHOP q21d     12/09/2020 Cancer Staging   Staging form: Lymphoid Neoplasms, AJCC 6th Edition - Clinical: Stage IV - Signed by [name], MD on 12/09/2020 Diagnostic confirmation: Positive histology Specimen type: Core Needle Biopsy Histopathologic type: Malignant lymphoma, large B-cell, diffuse, NOS Multiple tumors: Yes      HISTORY OF PRESENTING ILLNESS: Alone.  Walking with a rolling walker.   Mary Washington 75 y.o.  female n diffuse large B-cell lymphoma stage III CURRENTLY ON R-CHOP chemotherapy- is here for a follow-up.  Patient tolerated last cycle of chemotherapy fairly well.  No  worsening shortness of breath or cough.  No respiratory issues.   Appetite is good.  She is walking with a walker.   Review of Systems  Constitutional:  Positive for malaise/fatigue. Negative for chills, diaphoresis and fever.  HENT:  Negative for nosebleeds and sore throat.   Eyes:  Negative for double vision.  Respiratory:  Negative for cough, hemoptysis, sputum production, shortness of breath and wheezing.   Cardiovascular:  Negative for chest pain, palpitations, orthopnea and leg swelling.  Gastrointestinal:  Negative for abdominal pain, blood in stool, diarrhea, heartburn, melena, nausea and vomiting.  Genitourinary:  Negative for dysuria, frequency and urgency.  Musculoskeletal:  Positive for joint pain.  Skin: Negative.  Negative for itching and rash.  Neurological:  Negative for dizziness, tingling, focal weakness, weakness and headaches.  Endo/Heme/Allergies:  Does not bruise/bleed easily.  Psychiatric/Behavioral:  Negative for depression. The patient is not nervous/anxious and does not have insomnia.     MEDICAL HISTORY:  Past Medical History:  Diagnosis Date   Colon polyps    Diverticulosis    Melanoma ([place]) 1988   lt upper arm, some lymph nodes removed   Mild intermittent asthma    Osteoporosis     SURGICAL HISTORY: Past Surgical History:  Procedure Laterality Date   BACK SURGERY     CATARACT EXTRACTION     IR IMAGING GUIDED PORT INSERTION  11/23/2020   TONSILLECTOMY     VAGINAL HYSTERECTOMY  1990    SOCIAL HISTORY: Social History   Socioeconomic History   Marital status: Married    Spouse name: Mikki Santee   Number  of children: 2   Years of education: Not on file   Highest education level: Not on file  Occupational History   Not on file  Tobacco Use   Smoking status: Never   Smokeless tobacco: Never  Substance and Sexual Activity   Alcohol use: Never   Drug use: Never   Sexual activity: Yes    Partners: Male  Other Topics Concern   Not on file  Social  History Narrative   ** Merged History Encounter **       Lives at home with husband.    Social Determinants of Health   Financial Resource Strain: Not on file  Food Insecurity: Not on file  Transportation Needs: Not on file  Physical Activity: Not on file  Stress: Not on file  Social Connections: Not on file  Intimate Partner Violence: Not on file    FAMILY HISTORY: Family History  Problem Relation Age of Onset   Breast cancer Neg Hx     ALLERGIES:  has No Known Allergies.  MEDICATIONS:  Current Outpatient Medications  Medication Sig Dispense Refill   acetaminophen (TYLENOL) 500 MG tablet Take 500 mg by mouth every 6 (six) hours as needed.     acyclovir (ZOVIRAX) 400 MG tablet Take 1 tablet (400 mg total) by mouth 2 (two) times daily. To prevent shingles 60 tablet 3   Cholecalciferol (VITAMIN D) 50 MCG (2000 UT) CAPS Take 1 capsule by mouth daily.     lidocaine-prilocaine (EMLA) cream lidocaine-prilocaine 2.5 %-2.5 % topical cream  APPLY TOPICALLY TO THE AFFECTED AREA 1 TIME     Magnesium Oxide (MAG-OXIDE PO) Take 1 capsule by mouth daily at 12 noon.     meloxicam (MOBIC) 15 MG tablet Take 1 tablet by mouth daily.     montelukast (SINGULAIR) 10 MG tablet TAKE 1 TABLET(10 MG) BY MOUTH AT BEDTIME. START 2 DAYS BEFORE INFUSION. TAKE IT FOR 4 DAYS 90 tablet 1   omeprazole (PRILOSEC) 20 MG capsule Take 20 mg by mouth 2 (two) times daily.     potassium chloride SA (KLOR-CON) 20 MEQ tablet 1 pill twice a day 60 tablet 1   predniSONE (DELTASONE) 50 MG tablet Take 2 tablets (100 mg total) by mouth daily with breakfast. 10 tablet 4   allopurinol (ZYLOPRIM) 300 MG tablet TAKE 1 TABLET(300 MG) BY MOUTH TWICE DAILY TO AVOID GOUT FLARE UP WHILE ON CHEMO (Patient not taking: No sig reported) 60 tablet 2   benzonatate (TESSALON PERLES) 100 MG capsule Take 1 capsule (100 mg total) by mouth 3 (three) times daily as needed for cough. (Patient not taking: No sig reported) 30 capsule 0    ondansetron (ZOFRAN) 8 MG tablet Take 1 tablet (8 mg total) by mouth 2 (two) times daily as needed for refractory nausea / vomiting. Start on day 3 after cyclophosphamide chemotherapy. (Patient not taking: No sig reported) 30 tablet 1   predniSONE (DELTASONE) 20 MG tablet Take 1 pill a day in the morning with breakfast x 3 more days; and then 1/2 a pill a day. Do not stop until further instructions. (Patient not taking: No sig reported) 30 tablet 0   prochlorperazine (COMPAZINE) 10 MG tablet Take 1 tablet (10 mg total) by mouth every 6 (six) hours as needed (Nausea or vomiting). (Patient not taking: No sig reported) 30 tablet 6   traMADol (ULTRAM) 50 MG tablet Take 1 tablet (50 mg total) by mouth every 12 (twelve) hours as needed for moderate pain. (Patient not taking:  No sig reported) 12 tablet 0   No current facility-administered medications for this visit.   Facility-Administered Medications Ordered in Other Visits  Medication Dose Route Frequency Provider Last Rate Last Admin   sodium chloride flush (NS) 0.9 % injection 10 mL  10 mL Intravenous PRN [name], MD   10 mL at 01/07/21 0919      .  PHYSICAL EXAMINATION: ECOG PERFORMANCE STATUS: 1 - Symptomatic but completely ambulatory  Vitals:   03/18/21 0835  BP: (!) 147/64  Pulse: 94  Resp: 18  Temp: (!) 97.4 F (36.3 C)  SpO2: 100%   Filed Weights   03/18/21 0835  Weight: 114 lb 6.4 oz (51.9 kg)     Physical Exam HENT:     Head: Normocephalic and atraumatic.     Mouth/Throat:     Pharynx: No oropharyngeal exudate.  Eyes:     Pupils: Pupils are equal, round, and reactive to light.  Cardiovascular:     Rate and Rhythm: Normal rate and regular rhythm.  Pulmonary:     Effort: Pulmonary effort is normal. No respiratory distress.     Breath sounds: Normal breath sounds. No wheezing.  Abdominal:     General: Bowel sounds are normal. There is no distension.     Palpations: Abdomen is soft. There is no mass.      Tenderness: There is no abdominal tenderness. There is no guarding or rebound.  Musculoskeletal:        General: Tenderness present. Normal range of motion.     Cervical back: Normal range of motion and neck supple.     Left lower leg: Edema present.  Skin:    General: Skin is warm.  Neurological:     Mental Status: She is alert and oriented to person, place, and time.  Psychiatric:        Mood and Affect: Affect normal.     LABORATORY DATA:  I have reviewed the data as listed Lab Results  Component Value Date   WBC 11.0 (H) 03/18/2021   HGB 12.0 03/18/2021   HCT 37.3 03/18/2021   MCV 89.7 03/18/2021   PLT 464 (H) 03/18/2021   Recent Labs    02/15/21 0910 02/25/21 0834 03/07/21 0850 03/18/21 0817  NA 134* 134* 130* 132*  K 3.1* 3.0* 3.7 3.5  CL 94* 98 95* 98  CO2 29 28 26 26   GLUCOSE 84 126* 132* 126*  BUN 15 16 13 16   CREATININE 0.80 0.74 0.77 0.64  CALCIUM 9.2 9.0 8.9 9.0  GFRNONAA >60 >60 >60 >60  PROT 6.3* 5.9*  --  6.3*  ALBUMIN 3.7 3.7  --  3.8  AST 27 26  --  23  ALT 21 17  --  16  ALKPHOS 115 109  --  95  BILITOT 0.5 0.6  --  0.1*    RADIOGRAPHIC STUDIES: I have personally reviewed the radiological images as listed and agreed with the findings in the report.   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  Tolerated cycle #4 without any respiratory issues.  # Pneumonitis-recheck #3-? rituximab improved- monitor for now.   # Hypokalemia: 3.7-continue KDur 20 BID.   #Acute on  chronic back pain-likely secondary to lymphoma impinging the neuroforaminal nerve-  Improved; # Left knee pain/stiff [s/p Evaluation Ortho]- on volatren gel- STABLE.   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   # Reflux likely from prednisone. Continue PPI twice daily.  Also Tums as needed- STABLE.   # s/p COVID EVUSHELD PROPHYLAXIS/ vaccinated to [place].    # Gout/shingles prophylaxis- STOP allopurinol/continue acyclovir-stable  * [phone] [home]  # DISPOSITION:de-access # follow up as planned/MD/alsb- chemo--Dr.B    All questions were answered. The patient knows to call the clinic with any problems, questions or concerns.    [name], MD 03/18/2021 9:00 AM

## 2021-03-18 NOTE — Assessment & Plan Note (Addendum)
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  Tolerated cycle #3 without any respiratory issues [see below]. Pt has 2 more R-CHOP cycles to finish.   # Pneumonitis-recheck #3-? rituximab improved- monitor for now.   # Hypokalemia: 3.7-continue KDur 20 BID.   #Acute on chronic back pain-likely secondary to lymphoma impinging the neuroforaminal nerve- Improved; # Left knee pain/stiff [s/p Evaluation Ortho]- on volatren gel- STABLE.   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  # Reflux likely from prednisone. Continue PPI twice daily.  Also Tums as needed- STABLE.  # s/p COVID EVUSHELD PROPHYLAXIS/ vaccinated to [place].    # Gout/shingles prophylaxis- STOP allopurinol/continue acyclovir-STABLE.  * [phone] [home]  # DISPOSITION: # Proceed with chemo today # in 10 days- MD: labs- cbc/bmp; possible IVfs 1 hour # follow up in 3 weeks-- MD; labs- cbc/cmp/LDH; R-CHOP; D-2 zixetnezo-Dr.B

## 2021-03-18 NOTE — Patient Instructions (Signed)
[facility]  Discharge Instructions: Thank you for choosing [facility] to provide your oncology and hematology care.  If you have a lab appointment with the Cancer Center, please go directly to the Cancer Center and check in at the registration area.  Wear comfortable clothing and clothing appropriate for easy access to any Portacath or PICC line.   We strive to give you quality time with your provider. You may need to reschedule your appointment if you arrive late (15 or more minutes).  Arriving late affects you and other patients whose appointments are after yours.  Also, if you miss three or more appointments without notifying the office, you may be dismissed from the clinic at the provider's discretion.      For prescription refill requests, have your pharmacy contact our office and allow 72 hours for refills to be completed.      To help prevent nausea and vomiting after your treatment, we encourage you to take your nausea medication as directed.  BELOW ARE SYMPTOMS THAT SHOULD BE REPORTED IMMEDIATELY: *FEVER GREATER THAN 100.4 F (38 C) OR HIGHER *CHILLS OR SWEATING *NAUSEA AND VOMITING THAT IS NOT CONTROLLED WITH YOUR NAUSEA MEDICATION *UNUSUAL SHORTNESS OF BREATH *UNUSUAL BRUISING OR BLEEDING *URINARY PROBLEMS (pain or burning when urinating, or frequent urination) *BOWEL PROBLEMS (unusual diarrhea, constipation, pain near the anus) TENDERNESS IN MOUTH AND THROAT WITH OR WITHOUT PRESENCE OF ULCERS (sore throat, sores in mouth, or a toothache) UNUSUAL RASH, SWELLING OR PAIN  UNUSUAL VAGINAL DISCHARGE OR ITCHING   Items with * indicate a potential emergency and should be followed up as soon as possible or go to the Emergency Department if any problems should occur.  Please show the CHEMOTHERAPY ALERT CARD or IMMUNOTHERAPY ALERT CARD at check-in to the Emergency Department and triage nurse.  Should you have questions after your  visit or need to cancel or reschedule your appointment, please contact [facility]  [phone] and follow the prompts.  Office hours are 8:00 a.m. to 4:30 p.m. Monday - Friday. Please note that voicemails left after 4:00 p.m. may not be returned until the following business day.  We are closed weekends and major holidays. You have access to a nurse at all times for urgent questions. Please call the main number to the clinic [phone] and follow the prompts.  For any non-urgent questions, you may also contact your provider using MyChart. We now offer e-Visits for anyone 18 and older to request care online for non-urgent symptoms. For details visit [email or web address].   Also download the MyChart app! Go to the app store, search "MyChart", open the app, select [facility], and log in with your MyChart username and password.  Due to Covid, a mask is required upon entering the hospital/clinic. If you do not have a mask, one will be given to you upon arrival. For doctor visits, patients may have 1 support person aged 18 or older with them. For treatment visits, patients cannot have anyone with them due to current Covid guidelines and our immunocompromised population.  

## 2021-03-18 NOTE — Progress Notes (Signed)
Doing well. Appetite is good. Sleeping well. Energy is better. No side effects noticed from treatment. Left foot gets numb intermittently.

## 2021-03-19 ENCOUNTER — Inpatient Hospital Stay: Payer: Medicare Other

## 2021-03-19 DIAGNOSIS — C8581 Other specified types of non-Hodgkin lymphoma, lymph nodes of head, face, and neck: Secondary | ICD-10-CM

## 2021-03-19 DIAGNOSIS — Z5111 Encounter for antineoplastic chemotherapy: Secondary | ICD-10-CM | POA: Diagnosis not present

## 2021-03-19 MED ORDER — PEGFILGRASTIM-BMEZ 6 MG/0.6ML ~~LOC~~ SOSY
6.0000 mg | PREFILLED_SYRINGE | Freq: Once | SUBCUTANEOUS | Status: AC
  Administered 2021-03-19: 6 mg via SUBCUTANEOUS
  Filled 2021-03-19: qty 0.6

## 2021-03-24 ENCOUNTER — Encounter: Payer: Self-pay | Admitting: Internal Medicine

## 2021-03-25 ENCOUNTER — Other Ambulatory Visit: Payer: Self-pay

## 2021-03-26 ENCOUNTER — Encounter: Payer: Self-pay | Admitting: Internal Medicine

## 2021-03-26 MED ORDER — ACYCLOVIR 400 MG PO TABS: 400.0000 mg | ORAL_TABLET | Freq: Two times a day (BID) | ORAL | 3 refills | Status: DC

## 2021-03-29 ENCOUNTER — Inpatient Hospital Stay: Payer: Medicare Other

## 2021-03-29 ENCOUNTER — Encounter: Payer: Self-pay | Admitting: Internal Medicine

## 2021-03-29 ENCOUNTER — Inpatient Hospital Stay (HOSPITAL_BASED_OUTPATIENT_CLINIC_OR_DEPARTMENT_OTHER): Payer: Medicare Other | Admitting: Internal Medicine

## 2021-03-29 ENCOUNTER — Other Ambulatory Visit: Payer: Self-pay

## 2021-03-29 ENCOUNTER — Telehealth: Payer: Self-pay

## 2021-03-29 VITALS — BP 143/64 | HR 103 | Temp 97.4°F | Resp 18

## 2021-03-29 DIAGNOSIS — Z5111 Encounter for antineoplastic chemotherapy: Secondary | ICD-10-CM | POA: Diagnosis not present

## 2021-03-29 DIAGNOSIS — C8581 Other specified types of non-Hodgkin lymphoma, lymph nodes of head, face, and neck: Secondary | ICD-10-CM | POA: Diagnosis not present

## 2021-03-29 LAB — COMPREHENSIVE METABOLIC PANEL
ALT: 13 U/L (ref 0–44)
AST: 23 U/L (ref 15–41)
Albumin: 3.8 g/dL (ref 3.5–5.0)
Alkaline Phosphatase: 98 U/L (ref 38–126)
Anion gap: 8 (ref 5–15)
BUN: 12 mg/dL (ref 8–23)
CO2: 27 mmol/L (ref 22–32)
Calcium: 9.1 mg/dL (ref 8.9–10.3)
Chloride: 94 mmol/L — ABNORMAL LOW (ref 98–111)
Creatinine, Ser: 0.73 mg/dL (ref 0.44–1.00)
GFR, Estimated: >60 mL/min (ref >60)
Glucose, Bld: 125 mg/dL — ABNORMAL HIGH (ref 70–99)
Potassium: 3.8 mmol/L (ref 3.5–5.1)
Sodium: 129 mmol/L — ABNORMAL LOW (ref 135–145)
Total Bilirubin: 0.1 mg/dL — ABNORMAL LOW (ref 0.3–1.2)
Total Protein: 5.8 g/dL — ABNORMAL LOW (ref 6.5–8.1)

## 2021-03-29 LAB — CBC WITH DIFFERENTIAL/PLATELET
Abs Immature Granulocytes: 1.99 10*3/uL — ABNORMAL HIGH (ref 0.00–0.07)
Basophils Absolute: 0 10*3/uL (ref 0.0–0.1)
Basophils Relative: 0 %
Eosinophils Absolute: 0.2 10*3/uL (ref 0.0–0.5)
Eosinophils Relative: 2 %
HCT: 35.2 % — ABNORMAL LOW (ref 36.0–46.0)
Hemoglobin: 11.6 g/dL — ABNORMAL LOW (ref 12.0–15.0)
Immature Granulocytes: 20 %
Lymphocytes Relative: 8 %
Lymphs Abs: 0.8 10*3/uL (ref 0.7–4.0)
MCH: 29.4 pg (ref 26.0–34.0)
MCHC: 33 g/dL (ref 30.0–36.0)
MCV: 89.1 fL (ref 80.0–100.0)
Monocytes Absolute: 1 10*3/uL (ref 0.1–1.0)
Monocytes Relative: 10 %
Neutro Abs: 6.1 10*3/uL (ref 1.7–7.7)
Neutrophils Relative %: 60 %
Platelets: 241 10*3/uL (ref 150–400)
RBC: 3.95 MIL/uL (ref 3.87–5.11)
RDW: 15.7 % — ABNORMAL HIGH (ref 11.5–15.5)
Smear Review: NORMAL
WBC: 10.1 10*3/uL (ref 4.0–10.5)
nRBC: 0.2 % (ref 0.0–0.2)

## 2021-03-29 LAB — LACTATE DEHYDROGENASE: LDH: 296 U/L — ABNORMAL HIGH (ref 98–192)

## 2021-03-29 MED ORDER — HEPARIN SOD (PORK) LOCK FLUSH 100 UNIT/ML IV SOLN
500.0000 [IU] | Freq: Once | INTRAVENOUS | Status: AC
  Administered 2021-03-29: 500 [IU] via INTRAVENOUS
  Filled 2021-03-29: qty 5

## 2021-03-29 MED ORDER — SODIUM CHLORIDE 0.9 % IV SOLN
Freq: Once | INTRAVENOUS | Status: AC
  Filled 2021-03-29: qty 250

## 2021-03-29 MED ORDER — SODIUM CHLORIDE 0.9% FLUSH
10.0000 mL | INTRAVENOUS | Status: DC | PRN
  Administered 2021-03-29: 10 mL via INTRAVENOUS
  Filled 2021-03-29: qty 10

## 2021-03-29 MED ORDER — ACYCLOVIR 400 MG PO TABS
400.0000 mg | ORAL_TABLET | Freq: Two times a day (BID) | ORAL | 3 refills | Status: DC
Start: 2021-03-29 — End: 2021-10-25

## 2021-03-29 NOTE — Patient Instructions (Signed)

## 2021-03-29 NOTE — Progress Notes (Signed)
[place] NOTE  Patient Care Team: [name], MD as PCP - General (Internal Medicine) [name], MD as Consulting Physician (Oncology)  CHIEF COMPLAINTS/PURPOSE OF CONSULTATION: Lymphoma Oncology History Overview Note   # Incidental- MAY 2022-CT scan chest abdomen pelvis consistent with bulky retroperitoneal adenopathy [6-8 cm]; subpectoral/axillary lymphadenopathy 2 to 2.5 cm; PET June 2022-bulky right axillary/subpectoral; bulky retroperitoneal adenopathy.  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  Repeat MRI 3-6 m   # coloscopy [2022; [facility]]; mammogram- nov 2021-WNL.   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 .  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  Walking with a rolling walker.   Mary Washington 75 y.o.  female n diffuse large B-cell lymphoma stage III CURRENTLY ON R-CHOP chemotherapy- is here for a follow-up.  Complains of numbness in the left foot.  No  falls.  Feels stiff in the morning then improves.  Patient tolerated last cycle of chemotherapy fairly well.  No worsening shortness of breath or cough.  No respiratory issues.  Appetite is good.  She is walking with a walker.   Review of Systems  Constitutional:  Positive for malaise/fatigue. Negative for chills, diaphoresis and fever.  HENT:  Negative for nosebleeds and sore throat.   Eyes:  Negative for double vision.  Respiratory:  Negative for cough, hemoptysis, sputum production, shortness of breath and wheezing.   Cardiovascular:  Negative for chest pain, palpitations, orthopnea and leg swelling.  Gastrointestinal:  Negative for abdominal pain, blood in stool, diarrhea, heartburn, melena, nausea and vomiting.  Genitourinary:  Negative for dysuria, frequency and urgency.  Musculoskeletal:  Positive for joint pain.  Skin: Negative.  Negative for itching and rash.  Neurological:  Negative for dizziness, tingling, focal weakness, weakness and headaches.  Endo/Heme/Allergies:  Does not bruise/bleed easily.  Psychiatric/Behavioral:  Negative for depression. The patient is not nervous/anxious and does not have insomnia.     MEDICAL HISTORY:  Past Medical History:  Diagnosis Date   Colon polyps    Diverticulosis    Melanoma ([place]) 1988   lt upper arm, some lymph nodes removed   Mild intermittent asthma    Osteoporosis     SURGICAL HISTORY: Past Surgical History:  Procedure Laterality Date   BACK SURGERY     CATARACT EXTRACTION     IR IMAGING GUIDED PORT INSERTION  11/23/2020   TONSILLECTOMY     VAGINAL HYSTERECTOMY  1990    SOCIAL  HISTORY: Social History   Socioeconomic History   Marital status: Married    Spouse name: Mikki Santee   Number of children: 2   Years of education: Not on file   Highest education level: Not on file  Occupational History   Not on file  Tobacco Use   Smoking status: Never   Smokeless tobacco: Never  Substance and Sexual Activity   Alcohol use:  Never   Drug use: Never   Sexual activity: Yes    Partners: Male  Other Topics Concern   Not on file  Social History Narrative   ** Merged History Encounter **       Lives at home with husband.    Social Determinants of Health   Financial Resource Strain: Not on file  Food Insecurity: Not on file  Transportation Needs: Not on file  Physical Activity: Not on file  Stress: Not on file  Social Connections: Not on file  Intimate Partner Violence: Not on file    FAMILY HISTORY: Family History  Problem Relation Age of Onset   Breast cancer Neg Hx     ALLERGIES:  has No Known Allergies.  MEDICATIONS:  Current Outpatient Medications  Medication Sig Dispense Refill   Cholecalciferol (VITAMIN D) 50 MCG (2000 UT) CAPS Take 1 capsule by mouth daily.     lidocaine-prilocaine (EMLA) cream lidocaine-prilocaine 2.5 %-2.5 % topical cream  APPLY TOPICALLY TO THE AFFECTED AREA 1 TIME     Magnesium Oxide (MAG-OXIDE PO) Take 1 capsule by mouth daily at 12 noon.     meloxicam (MOBIC) 15 MG tablet Take 1 tablet by mouth daily.     montelukast (SINGULAIR) 10 MG tablet TAKE 1 TABLET(10 MG) BY MOUTH AT BEDTIME. START 2 DAYS BEFORE INFUSION. TAKE IT FOR 4 DAYS 90 tablet 1   Multiple Vitamins-Minerals (WOMENS MULTIVITAMIN PO) Take by mouth.     omeprazole (PRILOSEC) 20 MG capsule Take 20 mg by mouth 2 (two) times daily.     potassium chloride SA (KLOR-CON) 20 MEQ tablet TAKE 1 TABLET BY MOUTH TWICE DAILY 180 tablet 3   predniSONE (DELTASONE) 20 MG tablet Take 1 pill a day in the morning with breakfast x 3 more days; and then 1/2 a pill a day. Do not stop until further instructions. 30 tablet 0   predniSONE (DELTASONE) 50 MG tablet Take 2 tablets (100 mg total) by mouth daily with breakfast. 10 tablet 4   acetaminophen (TYLENOL) 500 MG tablet Take 500 mg by mouth every 6 (six) hours as needed. (Patient not taking: Reported on 03/29/2021)     acyclovir (ZOVIRAX) 400 MG tablet Take 1 tablet (400 mg  total) by mouth 2 (two) times daily. To prevent shingles 60 tablet 3   allopurinol (ZYLOPRIM) 300 MG tablet TAKE 1 TABLET(300 MG) BY MOUTH TWICE DAILY TO AVOID GOUT FLARE UP WHILE ON CHEMO 60 tablet 2   benzonatate (TESSALON PERLES) 100 MG capsule Take 1 capsule (100 mg total) by mouth 3 (three) times daily as needed for cough. (Patient not taking: No sig reported) 30 capsule 0   ondansetron (ZOFRAN) 8 MG tablet Take 1 tablet (8 mg total) by mouth 2 (two) times daily as needed for refractory nausea / vomiting. Start on day 3 after cyclophosphamide chemotherapy. (Patient not taking: No sig reported) 30 tablet 1   prochlorperazine (COMPAZINE) 10 MG tablet Take 1 tablet (10 mg total) by mouth every 6 (six) hours as needed (Nausea or vomiting). (Patient not taking: No sig reported) 30  tablet 6   traMADol (ULTRAM) 50 MG tablet Take 1 tablet (50 mg total) by mouth every 12 (twelve) hours as needed for moderate pain. (Patient not taking: No sig reported) 12 tablet 0   No current facility-administered medications for this visit.   Facility-Administered Medications Ordered in Other Visits  Medication Dose Route Frequency Provider Last Rate Last Admin   0.9 %  sodium chloride infusion   Intravenous Once Charlaine Dalton R, MD       heparin lock flush 100 unit/mL  500 Units Intravenous Once Charlaine Dalton R, MD       sodium chloride flush (NS) 0.9 % injection 10 mL  10 mL Intravenous PRN Charlaine Dalton R, MD   10 mL at 01/07/21 0919   sodium chloride flush (NS) 0.9 % injection 10 mL  10 mL Intravenous PRN [name], MD   10 mL at 03/29/21 0811      .  PHYSICAL EXAMINATION: ECOG PERFORMANCE STATUS: 1 - Symptomatic but completely ambulatory  Vitals:   03/29/21 0810  BP: (!) 143/64  Pulse: (!) 103  Temp: (!) 97.4 F (36.3 C)  SpO2: 100%   Filed Weights   03/29/21 0810  Weight: 113 lb (51.3 kg)     Physical Exam HENT:     Head: Normocephalic and atraumatic.      Mouth/Throat:     Pharynx: No oropharyngeal exudate.  Eyes:     Pupils: Pupils are equal, round, and reactive to light.  Cardiovascular:     Rate and Rhythm: Normal rate and regular rhythm.  Pulmonary:     Effort: Pulmonary effort is normal. No respiratory distress.     Breath sounds: Normal breath sounds. No wheezing.  Abdominal:     General: Bowel sounds are normal. There is no distension.     Palpations: Abdomen is soft. There is no mass.     Tenderness: There is no abdominal tenderness. There is no guarding or rebound.  Musculoskeletal:        General: Tenderness present. Normal range of motion.     Cervical back: Normal range of motion and neck supple.     Left lower leg: Edema present.  Skin:    General: Skin is warm.  Neurological:     Mental Status: She is alert and oriented to person, place, and time.  Psychiatric:        Mood and Affect: Affect normal.     LABORATORY DATA:  I have reviewed the data as listed Lab Results  Component Value Date   WBC 10.1 03/29/2021   HGB 11.6 (L) 03/29/2021   HCT 35.2 (L) 03/29/2021   MCV 89.1 03/29/2021   PLT 241 03/29/2021   Recent Labs    02/25/21 0834 03/07/21 0850 03/18/21 0817 03/29/21 0805  NA 134* 130* 132* 129*  K 3.0* 3.7 3.5 3.8  CL 98 95* 98 94*  CO2 28 26 26 27   GLUCOSE 126* 132* 126* 125*  BUN 16 13 16 12   CREATININE 0.74 0.77 0.64 0.73  CALCIUM 9.0 8.9 9.0 9.1  GFRNONAA >60 >60 >60 >60  PROT 5.9*  --  6.3* 5.8*  ALBUMIN 3.7  --  3.8 3.8  AST 26  --  23 23  ALT 17  --  16 13  ALKPHOS 109  --  95 98  BILITOT 0.6  --  0.1* 0.1*    RADIOGRAPHIC STUDIES: I have personally reviewed the radiological images as listed and agreed with the findings  in the report.   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 Pt has 2 more R-CHOP cycles to finish.   # Pneumonitis-s/p cycle #3 of R-CHOP-? rituximab improved- monitor for now; continue premedication with steroids prior to rituximab.  #Hyponatremia sodium 129-likely poor p.o. intake.;  Recommend IV fluids 1 L; increased salt intake.  # Hypokalemia: 3.7-continue KDur 20 BID.   #Peripheral neuropathy grade 1-secondary vincristine.  Monitor for now.  #Acute on chronic back pain-likely secondary to lymphoma impinging the neuroforaminal nerve- Improved; # Left knee pain/stiff [s/p Evaluation Ortho]- on volatren gel- STABLE   # Reflux likely from prednisone. Continue PPI twice daily.  Also Tums as needed STABLE  # s/p COVID EVUSHELD PROPHYLAXIS/ vaccinated to [place].    # Gout/shingles prophylaxis- STOP allopurinol/continue acyclovir-STABLE; refill acyclovir.  * [phone] [home]  # DISPOSITION:  # Proceed with IVFs today # follow up as planned- MD; labs- cbc/cmp/LDH; R-CHOP; D-2 zixetnezo-Dr.B     All questions were answered. The patient knows to call the clinic with any problems, questions or concerns.    [name], MD 03/29/2021 8:51 AM

## 2021-03-29 NOTE — Assessment & Plan Note (Addendum)
#  Diffuse large B-cell lymphoma- PET June 2022-bulky right axillary/subpectoral; bulky retroperitoneal adenopathy; s/p 3 cycles of R-CHOP; PET AUG 2022--significant clinical/radiological response; however bilateral diffuse groundglass opacities [concerning for treatment induced pneumonitis-see below]  # s/p cycle # 4-R-CHOP [interrupted because of pneumonitis-see below] x 10 days ago.Tolerated cycle #4 without any respiratory issues [see below]. Pt has 2 more R-CHOP cycles to finish.   # Pneumonitis-s/p cycle #3 of R-CHOP-? rituximab improved- monitor for now; continue premedication with steroids prior to rituximab.  #Hyponatremia sodium 129-likely poor p.o. intake.;  Recommend IV fluids 1 L; increased salt intake.  # Hypokalemia: 3.7-continue KDur 20 BID.   #Peripheral neuropathy grade 1-secondary vincristine.  Monitor for now.  #Acute on chronic back pain-likely secondary to lymphoma impinging the neuroforaminal nerve- Improved; # Left knee pain/stiff [s/p Evaluation Ortho]- on volatren gel- STABLE   # Reflux likely from prednisone. Continue PPI twice daily.  Also Tums as needed STABLE  # s/p COVID EVUSHELD PROPHYLAXIS/ vaccinated to [place].    # Gout/shingles prophylaxis- STOP allopurinol/continue acyclovir-STABLE; refill acyclovir.  * [phone] [home]  # DISPOSITION:  # Proceed with IVFs today # follow up as planned- MD; labs- cbc/cmp/LDH; R-CHOP; D-2 zixetnezo-Dr.B

## 2021-03-29 NOTE — Telephone Encounter (Signed)
Patient verbalizes instructions to take prednisone 50 mg 2 pills-START day before; not to take on the day of chemo; for total of 5 days. Patient states she belives she has enough at home and doesn't need a refill. She refuses refill to b sent to pharmacy at this time.

## 2021-03-29 NOTE — Patient Instructions (Signed)
Take prednisone 50 mg 2 pills-START  day before; not to take on the day of chemo; for total of 5 days.

## 2021-04-08 ENCOUNTER — Encounter: Payer: Self-pay | Admitting: Internal Medicine

## 2021-04-08 ENCOUNTER — Inpatient Hospital Stay: Payer: Medicare Other

## 2021-04-08 ENCOUNTER — Inpatient Hospital Stay: Payer: Medicare Other | Attending: Internal Medicine

## 2021-04-08 ENCOUNTER — Inpatient Hospital Stay (HOSPITAL_BASED_OUTPATIENT_CLINIC_OR_DEPARTMENT_OTHER): Payer: Medicare Other | Admitting: Internal Medicine

## 2021-04-08 ENCOUNTER — Other Ambulatory Visit: Payer: Self-pay

## 2021-04-08 VITALS — BP 134/71 | HR 88 | Temp 97.7°F | Resp 18

## 2021-04-08 DIAGNOSIS — E876 Hypokalemia: Secondary | ICD-10-CM | POA: Diagnosis not present

## 2021-04-08 DIAGNOSIS — Z5111 Encounter for antineoplastic chemotherapy: Secondary | ICD-10-CM | POA: Diagnosis not present

## 2021-04-08 DIAGNOSIS — E871 Hypo-osmolality and hyponatremia: Secondary | ICD-10-CM | POA: Insufficient documentation

## 2021-04-08 DIAGNOSIS — M549 Dorsalgia, unspecified: Secondary | ICD-10-CM | POA: Diagnosis not present

## 2021-04-08 DIAGNOSIS — Z79899 Other long term (current) drug therapy: Secondary | ICD-10-CM | POA: Diagnosis not present

## 2021-04-08 DIAGNOSIS — K219 Gastro-esophageal reflux disease without esophagitis: Secondary | ICD-10-CM | POA: Insufficient documentation

## 2021-04-08 DIAGNOSIS — G629 Polyneuropathy, unspecified: Secondary | ICD-10-CM | POA: Diagnosis not present

## 2021-04-08 DIAGNOSIS — C8581 Other specified types of non-Hodgkin lymphoma, lymph nodes of head, face, and neck: Secondary | ICD-10-CM

## 2021-04-08 DIAGNOSIS — C8338 Diffuse large B-cell lymphoma, lymph nodes of multiple sites: Secondary | ICD-10-CM | POA: Insufficient documentation

## 2021-04-08 DIAGNOSIS — G8929 Other chronic pain: Secondary | ICD-10-CM | POA: Diagnosis not present

## 2021-04-08 LAB — CBC WITH DIFFERENTIAL/PLATELET
Abs Immature Granulocytes: 0.23 10*3/uL — ABNORMAL HIGH (ref 0.00–0.07)
Basophils Absolute: 0 10*3/uL (ref 0.0–0.1)
Basophils Relative: 1 %
Eosinophils Absolute: 0 10*3/uL (ref 0.0–0.5)
Eosinophils Relative: 0 %
HCT: 34.2 % — ABNORMAL LOW (ref 36.0–46.0)
Hemoglobin: 11.4 g/dL — ABNORMAL LOW (ref 12.0–15.0)
Immature Granulocytes: 3 %
Lymphocytes Relative: 14 %
Lymphs Abs: 1.1 10*3/uL (ref 0.7–4.0)
MCH: 29.8 pg (ref 26.0–34.0)
MCHC: 33.3 g/dL (ref 30.0–36.0)
MCV: 89.3 fL (ref 80.0–100.0)
Monocytes Absolute: 1.5 10*3/uL — ABNORMAL HIGH (ref 0.1–1.0)
Monocytes Relative: 18 %
Neutro Abs: 5.4 10*3/uL (ref 1.7–7.7)
Neutrophils Relative %: 64 %
Platelets: 419 10*3/uL — ABNORMAL HIGH (ref 150–400)
RBC: 3.83 MIL/uL — ABNORMAL LOW (ref 3.87–5.11)
RDW: 15.6 % — ABNORMAL HIGH (ref 11.5–15.5)
WBC: 8.3 10*3/uL (ref 4.0–10.5)
nRBC: 0 % (ref 0.0–0.2)

## 2021-04-08 LAB — LACTATE DEHYDROGENASE: LDH: 221 U/L — ABNORMAL HIGH (ref 98–192)

## 2021-04-08 LAB — COMPREHENSIVE METABOLIC PANEL
ALT: 15 U/L (ref 0–44)
AST: 22 U/L (ref 15–41)
Albumin: 3.6 g/dL (ref 3.5–5.0)
Alkaline Phosphatase: 88 U/L (ref 38–126)
Anion gap: 7 (ref 5–15)
BUN: 19 mg/dL (ref 8–23)
CO2: 27 mmol/L (ref 22–32)
Calcium: 8.7 mg/dL — ABNORMAL LOW (ref 8.9–10.3)
Chloride: 97 mmol/L — ABNORMAL LOW (ref 98–111)
Creatinine, Ser: 0.62 mg/dL (ref 0.44–1.00)
GFR, Estimated: >60 mL/min (ref >60)
Glucose, Bld: 101 mg/dL — ABNORMAL HIGH (ref 70–99)
Potassium: 3.7 mmol/L (ref 3.5–5.1)
Sodium: 131 mmol/L — ABNORMAL LOW (ref 135–145)
Total Bilirubin: 0.6 mg/dL (ref 0.3–1.2)
Total Protein: 6.2 g/dL — ABNORMAL LOW (ref 6.5–8.1)

## 2021-04-08 MED ORDER — SODIUM CHLORIDE 0.9 % IV SOLN
150.0000 mg | Freq: Once | INTRAVENOUS | Status: AC
  Administered 2021-04-08: 150 mg via INTRAVENOUS
  Filled 2021-04-08: qty 150

## 2021-04-08 MED ORDER — SODIUM CHLORIDE 0.9 % IV SOLN
750.0000 mg/m2 | Freq: Once | INTRAVENOUS | Status: AC
  Administered 2021-04-08: 1160 mg via INTRAVENOUS
  Filled 2021-04-08: qty 58

## 2021-04-08 MED ORDER — HEPARIN SOD (PORK) LOCK FLUSH 100 UNIT/ML IV SOLN
500.0000 [IU] | Freq: Once | INTRAVENOUS | Status: DC
  Filled 2021-04-08: qty 5

## 2021-04-08 MED ORDER — DOXORUBICIN HCL CHEMO IV INJECTION 2 MG/ML
50.0000 mg/m2 | Freq: Once | INTRAVENOUS | Status: AC
  Administered 2021-04-08: 78 mg via INTRAVENOUS
  Filled 2021-04-08: qty 39

## 2021-04-08 MED ORDER — HEPARIN SOD (PORK) LOCK FLUSH 100 UNIT/ML IV SOLN
500.0000 [IU] | Freq: Once | INTRAVENOUS | Status: AC | PRN
  Administered 2021-04-08: 500 [IU]
  Filled 2021-04-08: qty 5

## 2021-04-08 MED ORDER — PALONOSETRON HCL INJECTION 0.25 MG/5ML
0.2500 mg | Freq: Once | INTRAVENOUS | Status: AC
  Administered 2021-04-08: 0.25 mg via INTRAVENOUS
  Filled 2021-04-08: qty 5

## 2021-04-08 MED ORDER — ACETAMINOPHEN 325 MG PO TABS
650.0000 mg | ORAL_TABLET | Freq: Once | ORAL | Status: AC
  Administered 2021-04-08: 650 mg via ORAL
  Filled 2021-04-08: qty 2

## 2021-04-08 MED ORDER — DIPHENHYDRAMINE HCL 25 MG PO CAPS
50.0000 mg | ORAL_CAPSULE | Freq: Once | ORAL | Status: AC
  Administered 2021-04-08: 50 mg via ORAL
  Filled 2021-04-08: qty 2

## 2021-04-08 MED ORDER — SODIUM CHLORIDE 0.9 % IV SOLN
375.0000 mg/m2 | Freq: Once | INTRAVENOUS | Status: AC
  Administered 2021-04-08: 600 mg via INTRAVENOUS
  Filled 2021-04-08: qty 50

## 2021-04-08 MED ORDER — VINCRISTINE SULFATE CHEMO INJECTION 1 MG/ML
2.0000 mg | Freq: Once | INTRAVENOUS | Status: AC
  Administered 2021-04-08: 2 mg via INTRAVENOUS
  Filled 2021-04-08: qty 2

## 2021-04-08 MED ORDER — SODIUM CHLORIDE 0.9 % IV SOLN
Freq: Once | INTRAVENOUS | Status: AC
  Filled 2021-04-08: qty 250

## 2021-04-08 MED ORDER — SODIUM CHLORIDE 0.9 % IV SOLN
10.0000 mg | Freq: Once | INTRAVENOUS | Status: AC
  Administered 2021-04-08: 10 mg via INTRAVENOUS
  Filled 2021-04-08: qty 10

## 2021-04-08 MED ORDER — SODIUM CHLORIDE 0.9% FLUSH
10.0000 mL | Freq: Once | INTRAVENOUS | Status: AC
  Administered 2021-04-08: 10 mL via INTRAVENOUS
  Filled 2021-04-08: qty 10

## 2021-04-08 NOTE — Assessment & Plan Note (Addendum)
#  Diffuse large B-cell lymphoma-stage III.  PET June 2022-bulky right axillary/subpectoral; bulky retroperitoneal adenopathy; s/p 3 cycles of R-CHOP; PET AUG 2022--significant clinical/radiological response; however bilateral diffuse groundglass opacities [concerning for treatment induced pneumonitis-see below].    #Proceed with cycle #5 of R-CHOP chemotherapy. Labs today reviewed;  acceptable for treatment today.   # Pneumonitis-s/p cycle #3 of R-CHOP-? rituximab improved- monitor for now; continue premedication with steroids prior to rituximab.  Monitor closely.  # Hyponatremia sodium 131-likely poor p.o. intake.; increased salt intake; STABLE.   # Hypokalemia: 3.7-continue KDur 20 BID. STABLE  #Peripheral neuropathy grade 1-secondary vincristine.  Monitor for now.STABLE  # chronic back pain-likely secondary to lymphoma impinging the neuroforaminal nerve- Improved; # Left knee pain/stiff [s/p Evaluation Ortho]- on volatren gel- STABLE   # Reflux likely from prednisone. Continue PPI twice daily.  Also Tums as needed STABLE  # s/p COVID EVUSHELD PROPHYLAXIS/ vaccinated to [place].    # Gout/shingles prophylaxis- STOP allopurinol/continue acyclovir-; refill acyclovir.STABLE  * [phone] [home]  # DISPOSITION: # Proceed with chemo today # in 10 days-  labs- cbc/bmp; possible IVfs 1 hour # follow up in 3 weeks-- MD; labs- cbc/cmp/LDH; R-CHOP; D-2 zixetnezo-Dr.B

## 2021-04-08 NOTE — Progress Notes (Signed)
Patient here for oncology follow-up appointment,  concerns of facial breaking out and back pain

## 2021-04-08 NOTE — Patient Instructions (Signed)
[facility]  Discharge Instructions: Thank you for choosing [facility] to provide your oncology and hematology care.  If you have a lab appointment with the Cancer Center, please go directly to the Cancer Center and check in at the registration area.  Wear comfortable clothing and clothing appropriate for easy access to any Portacath or PICC line.   We strive to give you quality time with your provider. You may need to reschedule your appointment if you arrive late (15 or more minutes).  Arriving late affects you and other patients whose appointments are after yours.  Also, if you miss three or more appointments without notifying the office, you may be dismissed from the clinic at the provider's discretion.      For prescription refill requests, have your pharmacy contact our office and allow 72 hours for refills to be completed.      To help prevent nausea and vomiting after your treatment, we encourage you to take your nausea medication as directed.  BELOW ARE SYMPTOMS THAT SHOULD BE REPORTED IMMEDIATELY: *FEVER GREATER THAN 100.4 F (38 C) OR HIGHER *CHILLS OR SWEATING *NAUSEA AND VOMITING THAT IS NOT CONTROLLED WITH YOUR NAUSEA MEDICATION *UNUSUAL SHORTNESS OF BREATH *UNUSUAL BRUISING OR BLEEDING *URINARY PROBLEMS (pain or burning when urinating, or frequent urination) *BOWEL PROBLEMS (unusual diarrhea, constipation, pain near the anus) TENDERNESS IN MOUTH AND THROAT WITH OR WITHOUT PRESENCE OF ULCERS (sore throat, sores in mouth, or a toothache) UNUSUAL RASH, SWELLING OR PAIN  UNUSUAL VAGINAL DISCHARGE OR ITCHING   Items with * indicate a potential emergency and should be followed up as soon as possible or go to the Emergency Department if any problems should occur.  Please show the CHEMOTHERAPY ALERT CARD or IMMUNOTHERAPY ALERT CARD at check-in to the Emergency Department and triage nurse.  Should you have questions after your  visit or need to cancel or reschedule your appointment, please contact [facility]  [phone] and follow the prompts.  Office hours are 8:00 a.m. to 4:30 p.m. Monday - Friday. Please note that voicemails left after 4:00 p.m. may not be returned until the following business day.  We are closed weekends and major holidays. You have access to a nurse at all times for urgent questions. Please call the main number to the clinic [phone] and follow the prompts.  For any non-urgent questions, you may also contact your provider using MyChart. We now offer e-Visits for anyone 18 and older to request care online for non-urgent symptoms. For details visit [email or web address].   Also download the MyChart app! Go to the app store, search "MyChart", open the app, select [facility], and log in with your MyChart username and password.  Due to Covid, a mask is required upon entering the hospital/clinic. If you do not have a mask, one will be given to you upon arrival. For doctor visits, patients may have 1 support person aged 18 or older with them. For treatment visits, patients cannot have anyone with them due to current Covid guidelines and our immunocompromised population.  

## 2021-04-08 NOTE — Progress Notes (Signed)
[facility] NOTE  Patient Care Team: [name], MD as PCP - General (Internal Medicine) [name], MD as Consulting Physician (Oncology)  CHIEF COMPLAINTS/PURPOSE OF CONSULTATION: Lymphoma Oncology History Overview Note   # Incidental- MAY 2022-CT scan chest abdomen pelvis consistent with bulky retroperitoneal adenopathy [6-8 cm]; subpectoral/axillary lymphadenopathy 2 to 2.5 cm; PET June 2022-bulky right axillary/subpectoral; bulky retroperitoneal adenopathy.  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  Repeat MRI 3-6 m   # coloscopy [2022; [facility]]; mammogram- nov 2021-WNL.   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 .  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  Walking with a rolling walker.   Mary Washington 75 y.o.  female n diffuse large B-cell lymphoma stage III CURRENTLY ON R-CHOP chemotherapy- is here for a follow-up.  Denies any nausea vomiting abdominal pain.   He was feeling back pain.  No worsening shortness of breath or cough.   Appetite is good.  She is walking with a walker.   Review of Systems  Constitutional:  Positive for malaise/fatigue. Negative for chills, diaphoresis and fever.  HENT:  Negative for nosebleeds and sore throat.   Eyes:  Negative for double vision.  Respiratory:  Negative for cough, hemoptysis, sputum production, shortness of breath and wheezing.   Cardiovascular:  Negative for chest pain, palpitations, orthopnea and leg swelling.  Gastrointestinal:  Negative for abdominal pain, blood in stool, diarrhea, heartburn, melena, nausea and vomiting.  Genitourinary:  Negative for dysuria, frequency and urgency.  Musculoskeletal:  Positive for joint pain.  Skin: Negative.  Negative for itching and rash.  Neurological:  Negative for dizziness, tingling, focal weakness, weakness and headaches.  Endo/Heme/Allergies:  Does not bruise/bleed easily.  Psychiatric/Behavioral:  Negative for depression. The patient is not nervous/anxious and does not have insomnia.     MEDICAL HISTORY:  Past Medical History:  Diagnosis Date   Colon polyps    Diverticulosis    Melanoma ([place]) 1988   lt upper arm, some lymph nodes removed   Mild intermittent asthma    Osteoporosis     SURGICAL HISTORY: Past Surgical History:  Procedure Laterality Date   BACK SURGERY     CATARACT EXTRACTION     IR IMAGING GUIDED PORT INSERTION  11/23/2020   TONSILLECTOMY     VAGINAL HYSTERECTOMY  1990    SOCIAL HISTORY: Social History   Socioeconomic History   Marital status: Married    Spouse name: Mikki Santee  Number of children: 2   Years of education: Not on file   Highest education level: Not on file  Occupational History   Not on file  Tobacco Use   Smoking status: Never   Smokeless tobacco: Never  Substance and Sexual Activity   Alcohol use: Never   Drug use: Never   Sexual activity: Yes    Partners: Male  Other Topics Concern   Not on file   Social History Narrative   ** Merged History Encounter **       Lives at home with husband.    Social Determinants of Health   Financial Resource Strain: Not on file  Food Insecurity: Not on file  Transportation Needs: Not on file  Physical Activity: Not on file  Stress: Not on file  Social Connections: Not on file  Intimate Partner Violence: Not on file    FAMILY HISTORY: Family History  Problem Relation Age of Onset   Breast cancer Neg Hx     ALLERGIES:  has No Known Allergies.  MEDICATIONS:  Current Outpatient Medications  Medication Sig Dispense Refill   acyclovir (ZOVIRAX) 400 MG tablet Take 1 tablet (400 mg total) by mouth 2 (two) times daily. To prevent shingles 60 tablet 3   Cholecalciferol (VITAMIN D) 50 MCG (2000 UT) CAPS Take 1 capsule by mouth daily.     lidocaine-prilocaine (EMLA) cream lidocaine-prilocaine 2.5 %-2.5 % topical cream  APPLY TOPICALLY TO THE AFFECTED AREA 1 TIME     Magnesium Oxide (MAG-OXIDE PO) Take 1 capsule by mouth daily at 12 noon.     meloxicam (MOBIC) 15 MG tablet Take 1 tablet by mouth daily.     montelukast (SINGULAIR) 10 MG tablet TAKE 1 TABLET(10 MG) BY MOUTH AT BEDTIME. START 2 DAYS BEFORE INFUSION. TAKE IT FOR 4 DAYS 90 tablet 1   Multiple Vitamins-Minerals (WOMENS MULTIVITAMIN PO) Take by mouth.     omeprazole (PRILOSEC) 20 MG capsule Take 20 mg by mouth 2 (two) times daily.     potassium chloride SA (KLOR-CON) 20 MEQ tablet TAKE 1 TABLET BY MOUTH TWICE DAILY 180 tablet 3   predniSONE (DELTASONE) 20 MG tablet Take 1 pill a day in the morning with breakfast x 3 more days; and then 1/2 a pill a day. Do not stop until further instructions. 30 tablet 0   predniSONE (DELTASONE) 50 MG tablet Take 2 tablets (100 mg total) by mouth daily with breakfast. 10 tablet 4   acetaminophen (TYLENOL) 500 MG tablet Take 500 mg by mouth every 6 (six) hours as needed. (Patient not taking: No sig reported)     allopurinol (ZYLOPRIM) 300 MG tablet TAKE 1  TABLET(300 MG) BY MOUTH TWICE DAILY TO AVOID GOUT FLARE UP WHILE ON CHEMO (Patient not taking: Reported on 04/08/2021) 60 tablet 2   benzonatate (TESSALON PERLES) 100 MG capsule Take 1 capsule (100 mg total) by mouth 3 (three) times daily as needed for cough. (Patient not taking: No sig reported) 30 capsule 0   ondansetron (ZOFRAN) 8 MG tablet Take 1 tablet (8 mg total) by mouth 2 (two) times daily as needed for refractory nausea / vomiting. Start on day 3 after cyclophosphamide chemotherapy. (Patient not taking: No sig reported) 30 tablet 1   prochlorperazine (COMPAZINE) 10 MG tablet Take 1 tablet (10 mg total) by mouth every 6 (six) hours as needed (Nausea or vomiting). (Patient not taking: Reported on 04/08/2021) 30 tablet 6   traMADol (ULTRAM) 50 MG tablet Take 1 tablet (50 mg  total) by mouth every 12 (twelve) hours as needed for moderate pain. 12 tablet 0   No current facility-administered medications for this visit.   Facility-Administered Medications Ordered in Other Visits  Medication Dose Route Frequency Provider Last Rate Last Admin   cyclophosphamide (CYTOXAN) 1,160 mg in sodium chloride 0.9 % 250 mL chemo infusion  750 mg/m2 (Treatment Plan Recorded) Intravenous Once [name], MD       dexamethasone (DECADRON) 10 mg in sodium chloride 0.9 % 50 mL IVPB  10 mg Intravenous Once [name], MD 204 mL/hr at 04/08/21 0920 10 mg at 04/08/21 0920   DOXOrubicin (ADRIAMYCIN) chemo injection 78 mg  50 mg/m2 (Treatment Plan Recorded) Intravenous Once [name], MD       fosaprepitant (EMEND) 150 mg in sodium chloride 0.9 % 145 mL IVPB  150 mg Intravenous Once Charlaine Dalton R, MD       heparin lock flush 100 unit/mL  500 Units Intravenous Once Charlaine Dalton R, MD       heparin lock flush 100 unit/mL  500 Units Intracatheter Once PRN [name], MD       riTUXimab-pvvr (RUXIENCE) 600 mg in sodium chloride 0.9 % 190 mL infusion  375 mg/m2  (Treatment Plan Recorded) Intravenous Once [name], MD       sodium chloride flush (NS) 0.9 % injection 10 mL  10 mL Intravenous PRN Charlaine Dalton R, MD   10 mL at 01/07/21 0919   vinCRIStine (ONCOVIN) 2 mg in sodium chloride 0.9 % 50 mL chemo infusion  2 mg Intravenous Once [name], MD          .  PHYSICAL EXAMINATION: ECOG PERFORMANCE STATUS: 1 - Symptomatic but completely ambulatory  Vitals:   04/08/21 0840  BP: (!) 154/72  Pulse: 87  Resp: 17  Temp: (!) 97.3 F (36.3 C)  SpO2: 100%   Filed Weights   04/08/21 0840  Weight: 114 lb (51.7 kg)     Physical Exam HENT:     Head: Normocephalic and atraumatic.     Mouth/Throat:     Pharynx: No oropharyngeal exudate.  Eyes:     Pupils: Pupils are equal, round, and reactive to light.  Cardiovascular:     Rate and Rhythm: Normal rate and regular rhythm.  Pulmonary:     Effort: Pulmonary effort is normal. No respiratory distress.     Breath sounds: Normal breath sounds. No wheezing.  Abdominal:     General: Bowel sounds are normal. There is no distension.     Palpations: Abdomen is soft. There is no mass.     Tenderness: There is no abdominal tenderness. There is no guarding or rebound.  Musculoskeletal:        General: Tenderness present. Normal range of motion.     Cervical back: Normal range of motion and neck supple.     Left lower leg: Edema present.  Skin:    General: Skin is warm.  Neurological:     Mental Status: She is alert and oriented to person, place, and time.  Psychiatric:        Mood and Affect: Affect normal.     LABORATORY DATA:  I have reviewed the data as listed Lab Results  Component Value Date   WBC 8.3 04/08/2021   HGB 11.4 (L) 04/08/2021   HCT 34.2 (L) 04/08/2021   MCV 89.3 04/08/2021   PLT 419 (H) 04/08/2021   Recent Labs  03/18/21 0817 03/29/21 0805 04/08/21 0817  NA 132* 129* 131*  K 3.5 3.8 3.7  CL 98 94* 97*  CO2 26 27 27   GLUCOSE 126*  125* 101*  BUN 16 12 19   CREATININE 0.64 0.73 0.62  CALCIUM 9.0 9.1 8.7*  GFRNONAA >60 >60 >60  PROT 6.3* 5.8* 6.2*  ALBUMIN 3.8 3.8 3.6  AST 23 23 22   ALT 16 13 15   ALKPHOS 95 98 88  BILITOT 0.1* 0.1* 0.6    RADIOGRAPHIC STUDIES: I have personally reviewed the radiological images as listed and agreed with the findings in the report.   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  PET June 2022-bulky right axillary/subpectoral; bulky retroperitoneal adenopathy; s/p 3 cycles of R-CHOP; PET AUG 2022--significant clinical/radiological response; however bilateral diffuse groundglass opacities [concerning for treatment induced pneumonitis-see below].    #Proceed with cycle #5 of R-CHOP chemotherapy. Labs today reviewed;  acceptable for treatment today.   # Pneumonitis-s/p cycle #3 of R-CHOP-? rituximab improved- monitor for now; continue premedication with steroids prior to rituximab.  Monitor closely.  # Hyponatremia sodium 131-likely poor p.o. intake.; increased salt intake; STABLE.   # Hypokalemia: 3.7-continue KDur 20 BID. STABLE  #Peripheral neuropathy grade 1-secondary vincristine.  Monitor for now.STABLE  # chronic back pain-likely secondary to lymphoma impinging the neuroforaminal nerve- Improved; # Left knee pain/stiff [s/p Evaluation Ortho]- on volatren gel- STABLE   # Reflux likely from prednisone. Continue PPI twice daily.  Also Tums as needed STABLE  # s/p COVID EVUSHELD PROPHYLAXIS/ vaccinated to [place].    # Gout/shingles prophylaxis- STOP allopurinol/continue acyclovir-; refill acyclovir.STABLE  * [phone] [home]  # DISPOSITION: # Proceed with chemo today # in 10 days-  labs- cbc/bmp; possible IVfs 1 hour # follow up in 3 weeks-- MD; labs- cbc/cmp/LDH; R-CHOP; D-2 [id number]      All questions were answered. The patient knows to call the clinic with any problems, questions or  concerns.    [name], MD 04/08/2021 9:23 AM

## 2021-04-09 ENCOUNTER — Inpatient Hospital Stay: Payer: Medicare Other

## 2021-04-09 DIAGNOSIS — Z5111 Encounter for antineoplastic chemotherapy: Secondary | ICD-10-CM | POA: Diagnosis not present

## 2021-04-09 DIAGNOSIS — C8581 Other specified types of non-Hodgkin lymphoma, lymph nodes of head, face, and neck: Secondary | ICD-10-CM

## 2021-04-09 MED ORDER — PEGFILGRASTIM-BMEZ 6 MG/0.6ML ~~LOC~~ SOSY
6.0000 mg | PREFILLED_SYRINGE | Freq: Once | SUBCUTANEOUS | Status: AC
  Administered 2021-04-09: 6 mg via SUBCUTANEOUS
  Filled 2021-04-09: qty 0.6

## 2021-04-12 ENCOUNTER — Encounter: Payer: Self-pay | Admitting: Internal Medicine

## 2021-04-13 ENCOUNTER — Other Ambulatory Visit: Payer: Self-pay | Admitting: Nurse Practitioner

## 2021-04-13 DIAGNOSIS — K5900 Constipation, unspecified: Secondary | ICD-10-CM

## 2021-04-13 MED ORDER — LACTULOSE 10 GM/15ML PO SOLN: 10.0000 g | Freq: Two times a day (BID) | ORAL | 1 refills | Status: DC | PRN

## 2021-04-13 NOTE — Progress Notes (Signed)
Patient called with complaints of constipation since treatment on Monday. Unrelieved with miralax. Prescription sent for lactulose.

## 2021-04-15 ENCOUNTER — Emergency Department: Payer: Medicare Other

## 2021-04-15 ENCOUNTER — Inpatient Hospital Stay
Admission: EM | Admit: 2021-04-15 | Discharge: 2021-05-03 | DRG: 329 | Disposition: A | Discharge status: Home Health | Payer: Medicare Other | Attending: Internal Medicine | Admitting: Internal Medicine

## 2021-04-15 ENCOUNTER — Other Ambulatory Visit: Payer: Self-pay | Admitting: Internal Medicine

## 2021-04-15 ENCOUNTER — Other Ambulatory Visit: Payer: Self-pay

## 2021-04-15 ENCOUNTER — Inpatient Hospital Stay: Payer: Medicare Other

## 2021-04-15 DIAGNOSIS — N739 Female pelvic inflammatory disease, unspecified: Secondary | ICD-10-CM | POA: Diagnosis present

## 2021-04-15 DIAGNOSIS — Z79899 Other long term (current) drug therapy: Secondary | ICD-10-CM

## 2021-04-15 DIAGNOSIS — Z85820 Personal history of malignant melanoma of skin: Secondary | ICD-10-CM | POA: Diagnosis not present

## 2021-04-15 DIAGNOSIS — I700 Atherosclerosis of aorta: Secondary | ICD-10-CM | POA: Diagnosis present

## 2021-04-15 DIAGNOSIS — K5651 Intestinal adhesions [bands], with partial obstruction: Secondary | ICD-10-CM | POA: Diagnosis present

## 2021-04-15 DIAGNOSIS — C8331 Diffuse large B-cell lymphoma, lymph nodes of head, face, and neck: Secondary | ICD-10-CM | POA: Diagnosis present

## 2021-04-15 DIAGNOSIS — G8929 Other chronic pain: Secondary | ICD-10-CM | POA: Diagnosis present

## 2021-04-15 DIAGNOSIS — K5792 Diverticulitis of intestine, part unspecified, without perforation or abscess without bleeding: Secondary | ICD-10-CM | POA: Diagnosis not present

## 2021-04-15 DIAGNOSIS — E870 Hyperosmolality and hypernatremia: Secondary | ICD-10-CM | POA: Diagnosis not present

## 2021-04-15 DIAGNOSIS — K56609 Unspecified intestinal obstruction, unspecified as to partial versus complete obstruction: Secondary | ICD-10-CM | POA: Diagnosis not present

## 2021-04-15 DIAGNOSIS — J4520 Mild intermittent asthma, uncomplicated: Secondary | ICD-10-CM | POA: Diagnosis present

## 2021-04-15 DIAGNOSIS — K5732 Diverticulitis of large intestine without perforation or abscess without bleeding: Secondary | ICD-10-CM | POA: Diagnosis not present

## 2021-04-15 DIAGNOSIS — K567 Ileus, unspecified: Secondary | ICD-10-CM | POA: Diagnosis not present

## 2021-04-15 DIAGNOSIS — B9620 Unspecified Escherichia coli [E. coli] as the cause of diseases classified elsewhere: Secondary | ICD-10-CM | POA: Diagnosis present

## 2021-04-15 DIAGNOSIS — M545 Low back pain, unspecified: Secondary | ICD-10-CM | POA: Diagnosis present

## 2021-04-15 DIAGNOSIS — K9189 Other postprocedural complications and disorders of digestive system: Secondary | ICD-10-CM | POA: Diagnosis not present

## 2021-04-15 DIAGNOSIS — Z0189 Encounter for other specified special examinations: Secondary | ICD-10-CM

## 2021-04-15 DIAGNOSIS — D701 Agranulocytosis secondary to cancer chemotherapy: Secondary | ICD-10-CM

## 2021-04-15 DIAGNOSIS — E871 Hypo-osmolality and hyponatremia: Secondary | ICD-10-CM | POA: Diagnosis present

## 2021-04-15 DIAGNOSIS — D61810 Antineoplastic chemotherapy induced pancytopenia: Secondary | ICD-10-CM | POA: Diagnosis present

## 2021-04-15 DIAGNOSIS — Z7952 Long term (current) use of systemic steroids: Secondary | ICD-10-CM | POA: Diagnosis not present

## 2021-04-15 DIAGNOSIS — R059 Cough, unspecified: Secondary | ICD-10-CM

## 2021-04-15 DIAGNOSIS — K651 Peritoneal abscess: Secondary | ICD-10-CM | POA: Diagnosis present

## 2021-04-15 DIAGNOSIS — T451X5A Adverse effect of antineoplastic and immunosuppressive drugs, initial encounter: Secondary | ICD-10-CM | POA: Diagnosis present

## 2021-04-15 DIAGNOSIS — R000 Tachycardia, unspecified: Secondary | ICD-10-CM | POA: Diagnosis not present

## 2021-04-15 DIAGNOSIS — Z791 Long term (current) use of non-steroidal anti-inflammatories (NSAID): Secondary | ICD-10-CM

## 2021-04-15 DIAGNOSIS — R1084 Generalized abdominal pain: Secondary | ICD-10-CM | POA: Diagnosis not present

## 2021-04-15 DIAGNOSIS — R54 Age-related physical debility: Secondary | ICD-10-CM | POA: Diagnosis present

## 2021-04-15 DIAGNOSIS — M549 Dorsalgia, unspecified: Secondary | ICD-10-CM | POA: Diagnosis present

## 2021-04-15 DIAGNOSIS — E869 Volume depletion, unspecified: Secondary | ICD-10-CM | POA: Diagnosis present

## 2021-04-15 DIAGNOSIS — K5720 Diverticulitis of large intestine with perforation and abscess without bleeding: Principal | ICD-10-CM | POA: Diagnosis present

## 2021-04-15 DIAGNOSIS — C8581 Other specified types of non-Hodgkin lymphoma, lymph nodes of head, face, and neck: Secondary | ICD-10-CM | POA: Diagnosis not present

## 2021-04-15 DIAGNOSIS — Z20822 Contact with and (suspected) exposure to covid-19: Secondary | ICD-10-CM | POA: Diagnosis present

## 2021-04-15 DIAGNOSIS — D84821 Immunodeficiency due to drugs: Secondary | ICD-10-CM | POA: Diagnosis present

## 2021-04-15 DIAGNOSIS — A419 Sepsis, unspecified organism: Secondary | ICD-10-CM

## 2021-04-15 DIAGNOSIS — M810 Age-related osteoporosis without current pathological fracture: Secondary | ICD-10-CM | POA: Diagnosis present

## 2021-04-15 DIAGNOSIS — D72828 Other elevated white blood cell count: Secondary | ICD-10-CM | POA: Diagnosis not present

## 2021-04-15 DIAGNOSIS — E876 Hypokalemia: Secondary | ICD-10-CM | POA: Diagnosis not present

## 2021-04-15 DIAGNOSIS — Z4659 Encounter for fitting and adjustment of other gastrointestinal appliance and device: Secondary | ICD-10-CM

## 2021-04-15 LAB — COMPREHENSIVE METABOLIC PANEL
ALT: 14 U/L (ref 0–44)
AST: 23 U/L (ref 15–41)
Albumin: 3.5 g/dL (ref 3.5–5.0)
Alkaline Phosphatase: 88 U/L (ref 38–126)
Anion gap: 9 (ref 5–15)
BUN: 9 mg/dL (ref 8–23)
CO2: 26 mmol/L (ref 22–32)
Calcium: 9 mg/dL (ref 8.9–10.3)
Chloride: 88 mmol/L — ABNORMAL LOW (ref 98–111)
Creatinine, Ser: 0.49 mg/dL (ref 0.44–1.00)
GFR, Estimated: >60 mL/min (ref >60)
Glucose, Bld: 96 mg/dL (ref 70–99)
Potassium: 3.7 mmol/L (ref 3.5–5.1)
Sodium: 123 mmol/L — ABNORMAL LOW (ref 135–145)
Total Bilirubin: 0.7 mg/dL (ref 0.3–1.2)
Total Protein: 6.2 g/dL — ABNORMAL LOW (ref 6.5–8.1)

## 2021-04-15 LAB — URINALYSIS, COMPLETE (UACMP) WITH MICROSCOPIC
Bacteria, UA: NONE SEEN
Bilirubin Urine: NEGATIVE
Glucose, UA: NEGATIVE mg/dL
Ketones, ur: 5 mg/dL — AB
Leukocytes,Ua: NEGATIVE
Nitrite: NEGATIVE
Protein, ur: NEGATIVE mg/dL
Specific Gravity, Urine: 1.04 — ABNORMAL HIGH (ref 1.005–1.030)
Squamous Epithelial / HPF: NONE SEEN (ref 0–5)
pH: 7 (ref 5.0–8.0)

## 2021-04-15 LAB — CBC WITH DIFFERENTIAL/PLATELET
Abs Immature Granulocytes: 0.06 10*3/uL (ref 0.00–0.07)
Basophils Absolute: 0 10*3/uL (ref 0.0–0.1)
Basophils Relative: 3 %
Eosinophils Absolute: 0 10*3/uL (ref 0.0–0.5)
Eosinophils Relative: 6 %
HCT: 33.4 % — ABNORMAL LOW (ref 36.0–46.0)
Hemoglobin: 11.3 g/dL — ABNORMAL LOW (ref 12.0–15.0)
Immature Granulocytes: 18 %
Lymphocytes Relative: 18 %
Lymphs Abs: 0.1 10*3/uL — ABNORMAL LOW (ref 0.7–4.0)
MCH: 29.5 pg (ref 26.0–34.0)
MCHC: 33.8 g/dL (ref 30.0–36.0)
MCV: 87.2 fL (ref 80.0–100.0)
Monocytes Absolute: 0 10*3/uL — ABNORMAL LOW (ref 0.1–1.0)
Monocytes Relative: 12 %
Neutro Abs: 0.1 10*3/uL — CL (ref 1.7–7.7)
Neutrophils Relative %: 43 %
Platelets: 134 10*3/uL — ABNORMAL LOW (ref 150–400)
RBC: 3.83 MIL/uL — ABNORMAL LOW (ref 3.87–5.11)
RDW: 15.9 % — ABNORMAL HIGH (ref 11.5–15.5)
Smear Review: NORMAL
WBC: 0.3 10*3/uL — CL (ref 4.0–10.5)
nRBC: 0 % (ref 0.0–0.2)

## 2021-04-15 LAB — RESP PANEL BY RT-PCR (FLU A&B, COVID) ARPGX2
Influenza A by PCR: NEGATIVE
Influenza B by PCR: NEGATIVE
SARS Coronavirus 2 by RT PCR: NEGATIVE

## 2021-04-15 LAB — PATHOLOGIST SMEAR REVIEW

## 2021-04-15 LAB — LIPASE, BLOOD: Lipase: 20 U/L (ref 11–51)

## 2021-04-15 LAB — LACTIC ACID, PLASMA
Lactic Acid, Venous: 1.2 mmol/L (ref 0.5–1.9)
Lactic Acid, Venous: 1.5 mmol/L (ref 0.5–1.9)

## 2021-04-15 MED ORDER — IOHEXOL 350 MG/ML SOLN: 80.0000 mL | Freq: Once | INTRAVENOUS | Status: DC | PRN

## 2021-04-15 MED ORDER — PIPERACILLIN-TAZOBACTAM 3.375 G IVPB
3.3750 g | Freq: Three times a day (TID) | INTRAVENOUS | Status: DC
  Administered 2021-04-15 – 2021-04-29 (×41): 3.375 g via INTRAVENOUS
  Filled 2021-04-15 (×42): qty 50

## 2021-04-15 MED ORDER — ONDANSETRON HCL 4 MG PO TABS: 4.0000 mg | ORAL_TABLET | Freq: Four times a day (QID) | ORAL | Status: DC | PRN

## 2021-04-15 MED ORDER — METRONIDAZOLE 500 MG/100ML IV SOLN
500.0000 mg | Freq: Once | INTRAVENOUS | Status: AC
  Administered 2021-04-15: 500 mg via INTRAVENOUS
  Filled 2021-04-15: qty 100

## 2021-04-15 MED ORDER — IOHEXOL 300 MG/ML  SOLN
80.0000 mL | Freq: Once | INTRAMUSCULAR | Status: AC | PRN
  Administered 2021-04-15: 80 mL via INTRAVENOUS

## 2021-04-15 MED ORDER — ACYCLOVIR 200 MG PO CAPS
400.0000 mg | ORAL_CAPSULE | Freq: Two times a day (BID) | ORAL | Status: DC
  Filled 2021-04-15 (×6): qty 2

## 2021-04-15 MED ORDER — ONDANSETRON HCL 4 MG/2ML IJ SOLN
4.0000 mg | Freq: Four times a day (QID) | INTRAMUSCULAR | Status: DC | PRN
  Administered 2021-04-15: 20:00:00 4 mg via INTRAVENOUS
  Filled 2021-04-15: qty 2

## 2021-04-15 MED ORDER — SODIUM CHLORIDE 0.9 % IV SOLN
2.0000 g | Freq: Once | INTRAVENOUS | Status: AC
  Administered 2021-04-15: 2 g via INTRAVENOUS
  Filled 2021-04-15: qty 2

## 2021-04-15 MED ORDER — LACTATED RINGERS IV BOLUS (SEPSIS)
1000.0000 mL | Freq: Once | INTRAVENOUS | Status: AC
  Administered 2021-04-15: 1000 mL via INTRAVENOUS

## 2021-04-15 MED ORDER — ACETAMINOPHEN 10 MG/ML IV SOLN
1000.0000 mg | Freq: Once | INTRAVENOUS | Status: AC
  Administered 2021-04-15: 1000 mg via INTRAVENOUS
  Filled 2021-04-15: qty 100

## 2021-04-15 MED ORDER — LIDOCAINE-PRILOCAINE 2.5-2.5 % EX CREA
TOPICAL_CREAM | Freq: Once | CUTANEOUS | Status: DC
  Filled 2021-04-15: qty 5

## 2021-04-15 MED ORDER — SODIUM CHLORIDE 0.9 % IV SOLN
INTRAVENOUS | Status: DC
  Administered 2021-04-15: 100 mL via INTRAVENOUS

## 2021-04-15 MED ORDER — ACETAMINOPHEN 500 MG PO TABS
500.0000 mg | ORAL_TABLET | Freq: Four times a day (QID) | ORAL | Status: DC | PRN
  Administered 2021-04-25 – 2021-04-27 (×7): 500 mg via ORAL
  Filled 2021-04-15 (×7): qty 1

## 2021-04-15 MED ORDER — LACTATED RINGERS IV BOLUS
1000.0000 mL | Freq: Once | INTRAVENOUS | Status: AC
  Administered 2021-04-15: 1000 mL via INTRAVENOUS

## 2021-04-15 MED ORDER — PROCHLORPERAZINE EDISYLATE 10 MG/2ML IJ SOLN
10.0000 mg | Freq: Once | INTRAMUSCULAR | Status: AC
  Administered 2021-04-15: 10 mg via INTRAVENOUS
  Filled 2021-04-15: qty 2

## 2021-04-15 NOTE — ED Notes (Signed)
Delsa Sale RN aware of assigned bed

## 2021-04-15 NOTE — Sepsis Progress Note (Signed)
Code Sepsis protocol being monitored by eLink. 

## 2021-04-15 NOTE — Progress Notes (Signed)
PHARMACY -  BRIEF ANTIBIOTIC NOTE   Pharmacy has received consult(s) for Cefepime from an ED provider.  The patient's profile has been reviewed for ht/wt/allergies/indication/available labs.    One time order(s) placed for Cefepime 2 gm IV X 1  Further antibiotics/pharmacy consults should be ordered by admitting physician if indicated.                       Thank you, Julie Nay D 04/15/2021  7:21 AM

## 2021-04-15 NOTE — Progress Notes (Signed)
Patient ID: Mary Washington, female   DOB: September 27, 1945, 75 y.o.   MRN: [id number] SURGERY FOLLOW UP NOTE  Patient was re evaluated and found without any clinical deterioration. Patient denies nausea or vomiting. Endorses passing few gasses.   Vitals:   04/15/21 1300 04/15/21 1515  BP: (!) 132/53 (!) 134/59  Pulse: (!) 111 (!) 108  Resp:  18  Temp: 98.7 F (37.1 C) 98.4 F (36.9 C)  SpO2: 98% 100%   A/P:  Patient re evaluated with physical exam. Slowly decreasing heart rated. Abdomen continue distended but no pain on palpation. No acute abdomen. No fever. Will continue medical management with IV abx therapy and bowel rest. Patient can get ice chips tonight. If she develops nausea or vomiting, will need NGT. Will continue to follow closely.  This follow up encounter was more than 30 minutes, most of the time counseling the patient and coordinating plan of care.   Herbert Pun, MD, FACS

## 2021-04-15 NOTE — Progress Notes (Signed)
Pharmacy Antibiotic Note  Mary Washington is a 75 y.o. 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 Pharmacy has been consulted for Zosyn dosing for intra-abdominal infection.  Plan: Zosyn 3.375g IV q8h (4 hour infusion) Monitor renal function and adjust dose as clinically indicated  Height: 5\' 2"  (157.5 cm) Weight: 51.7 kg (114 lb) IBW/kg (Calculated) : 50.1  Temp (24hrs), Avg:98.6 F (37 C), Min:98.6 F (37 C), Max:98.6 F (37 C)  Recent Labs  Lab 04/15/21 0540 04/15/21 0610 04/15/21 0626 04/15/21 0800  WBC 0.3*  --   --   --   CREATININE  --   --  0.49  --   LATICACIDVEN  --  1.2  --  1.5    Estimated Creatinine Clearance: 48.8 mL/min (by C-G formula based on SCr of 0.49 mg/dL).    No Known Allergies  Antimicrobials this admission: 11/14 cefepime and metronidazole x1 11/14 Zosyn >>    Microbiology results: 11/14 BCx: NG <12h 11/14 UCx: sent   Thank you for allowing pharmacy to be a part of this patient's care.  Archana Eckman O Vivaan Helseth 04/15/2021 11:10 AM

## 2021-04-15 NOTE — ED Notes (Signed)
Message sent to pharmacy to send up meds.

## 2021-04-15 NOTE — Consult Note (Signed)
[place] NOTE  Patient Care Team: [name], MD as PCP - General (Internal Medicine) [name], MD as Consulting Physician (Oncology)  CHIEF COMPLAINTS/PURPOSE OF CONSULTATION: Severe neutropenia/complicated diverticulitis  HISTORY OF PRESENTING ILLNESS:  Mary Washington 75 y.o.  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  CT scan-showed evidence of sigmoid diverticulitis/with perforation; also concerns of small bowel obstruction.  On labs patient noted to have severe neutropenia; mild anemia.  Renal function normal.  Of note patient received chemotherapy approximately week ago.  Patient also received long-acting growth factor postchemotherapy.  Patient is currently seen in the emergency room.  Patient denies any significant pain.  Any nausea vomiting.  Complains of thirst.  Review of Systems  Constitutional:  Positive for malaise/fatigue and weight loss. Negative for chills, diaphoresis and fever.  HENT:  Negative for nosebleeds and sore throat.   Eyes:  Negative for double vision.  Respiratory:  Negative for cough, hemoptysis, sputum production, shortness of breath and wheezing.   Cardiovascular:  Negative for chest pain, palpitations, orthopnea and leg swelling.  Gastrointestinal:  Positive for constipation and diarrhea. Negative for abdominal pain, blood in stool, heartburn, melena and vomiting.  Genitourinary:  Negative for dysuria, frequency and urgency.  Musculoskeletal:  Negative for back pain and joint pain.  Skin: Negative.  Negative for itching and rash.  Neurological:  Negative for dizziness, tingling, focal weakness, weakness and headaches.  Endo/Heme/Allergies:  Does not bruise/bleed easily.  Psychiatric/Behavioral:  Negative for depression. The patient is not nervous/anxious and does not have insomnia.     MEDICAL HISTORY:   Past Medical History:  Diagnosis Date   Colon polyps    Diverticulosis    Melanoma ([place]) 1988   lt upper arm, some lymph nodes removed   Mild intermittent asthma    Osteoporosis     SURGICAL HISTORY: Past Surgical History:  Procedure Laterality Date   BACK SURGERY     CATARACT EXTRACTION     IR IMAGING GUIDED PORT INSERTION  11/23/2020   TONSILLECTOMY     VAGINAL HYSTERECTOMY  1990    SOCIAL HISTORY: Social History   Socioeconomic History   Marital status: Married    Spouse name: Mikki Santee   Number of children: 2   Years of education: Not on file   Highest education level: Not on file  Occupational History   Not on file  Tobacco Use   Smoking status: Never   Smokeless tobacco: Never  Substance and Sexual Activity   Alcohol use: Never   Drug use: Never   Sexual activity: Yes    Partners: Male  Other Topics Concern   Not on file  Social History Narrative   ** Merged History Encounter **       Lives at home with husband.    Social Determinants of Health   Financial Resource Strain: Not on file  Food Insecurity: Not on file  Transportation Needs: Not on file  Physical Activity: Not on file  Stress: Not on file  Social Connections: Not on file  Intimate Partner Violence: Not on file    FAMILY HISTORY: Family History  Problem Relation Age of Onset   Breast cancer Neg Hx     ALLERGIES:  has No Known Allergies.  MEDICATIONS:  Current Facility-Administered Medications  Medication Dose Route Frequency Provider Last Rate Last Admin   0.9 %  sodium chloride infusion   Intravenous  Continuous Agbata, Tochukwu, MD 100 mL/hr at 04/15/21 1211 100 mL at 04/15/21 1211   acetaminophen (TYLENOL) tablet 500 mg  500 mg Oral Q6H PRN Agbata, Tochukwu, MD       acyclovir (ZOVIRAX) 200 MG capsule 400 mg  400 mg Oral BID Agbata, Tochukwu, MD       lidocaine-prilocaine (EMLA) cream   Topical Once Agbata, Tochukwu, MD       ondansetron (ZOFRAN) tablet 4 mg  4 mg Oral Q6H PRN  Agbata, Tochukwu, MD       Or   ondansetron (ZOFRAN) injection 4 mg  4 mg Intravenous Q6H PRN Agbata, Tochukwu, MD       piperacillin-tazobactam (ZOSYN) IVPB 3.375 g  3.375 g Intravenous Q8H Rauer, Forde Dandy, RPH       Current Outpatient Medications  Medication Sig Dispense Refill   acetaminophen (TYLENOL) 500 MG tablet Take 500 mg by mouth every 6 (six) hours as needed.     acyclovir (ZOVIRAX) 400 MG tablet Take 1 tablet (400 mg total) by mouth 2 (two) times daily. To prevent shingles 60 tablet 3   Cholecalciferol (VITAMIN D) 50 MCG (2000 UT) CAPS Take 1 capsule by mouth daily.     lidocaine-prilocaine (EMLA) cream lidocaine-prilocaine 2.5 %-2.5 % topical cream  APPLY TOPICALLY TO THE AFFECTED AREA 1 TIME     Magnesium Oxide (MAG-OXIDE PO) Take 1 capsule by mouth daily at 12 noon.     meloxicam (MOBIC) 15 MG tablet Take 1 tablet by mouth daily.     montelukast (SINGULAIR) 10 MG tablet TAKE 1 TABLET(10 MG) BY MOUTH AT BEDTIME. START 2 DAYS BEFORE INFUSION. TAKE IT FOR 4 DAYS 90 tablet 1   Multiple Vitamins-Minerals (WOMENS MULTIVITAMIN PO) Take by mouth.     omeprazole (PRILOSEC) 20 MG capsule Take 20 mg by mouth 2 (two) times daily.     potassium chloride SA (KLOR-CON) 20 MEQ tablet TAKE 1 TABLET BY MOUTH TWICE DAILY 180 tablet 3   traMADol (ULTRAM) 50 MG tablet Take 1 tablet by mouth every 6 (six) hours as needed.     allopurinol (ZYLOPRIM) 300 MG tablet TAKE 1 TABLET(300 MG) BY MOUTH TWICE DAILY TO AVOID GOUT FLARE UP WHILE ON CHEMO (Patient not taking: No sig reported) 60 tablet 2   benzonatate (TESSALON PERLES) 100 MG capsule Take 1 capsule (100 mg total) by mouth 3 (three) times daily as needed for cough. (Patient not taking: No sig reported) 30 capsule 0   lactulose (CHRONULAC) 10 GM/15ML solution Take 15 mLs (10 g total) by mouth 2 (two) times daily as needed for severe constipation. (Patient not taking: No sig reported) 236 mL 1   ondansetron (ZOFRAN) 8 MG tablet Take 1 tablet (8 mg  total) by mouth 2 (two) times daily as needed for refractory nausea / vomiting. Start on day 3 after cyclophosphamide chemotherapy. (Patient not taking: No sig reported) 30 tablet 1   predniSONE (DELTASONE) 50 MG tablet Take 2 tablets (100 mg total) by mouth daily with breakfast. (Patient not taking: Reported on 04/15/2021) 10 tablet 4   prochlorperazine (COMPAZINE) 10 MG tablet Take 1 tablet (10 mg total) by mouth every 6 (six) hours as needed (Nausea or vomiting). (Patient not taking: No sig reported) 30 tablet 6   scopolamine (TRANSDERM-SCOP) 1 MG/3DAYS Place 1 patch onto the skin 3 days. (Patient not taking: No sig reported)     traMADol (ULTRAM) 50 MG tablet Take 1 tablet (50 mg total) by mouth every 12 (twelve)  hours as needed for moderate pain. (Patient not taking: No sig reported) 12 tablet 0   Facility-Administered Medications Ordered in Other Encounters  Medication Dose Route Frequency Provider Last Rate Last Admin   sodium chloride flush (NS) 0.9 % injection 10 mL  10 mL Intravenous PRN [name], MD   10 mL at 01/07/21 0919      .  PHYSICAL EXAMINATION:  Vitals:   04/15/21 1200 04/15/21 1300  BP: (!) 147/65 (!) 132/53  Pulse: (!) 114 (!) 111  Resp:    Temp:  98.7 F (37.1 C)  SpO2: 100% 98%   Filed Weights   04/15/21 0531  Weight: 114 lb (51.7 kg)    Physical Exam Vitals and nursing note reviewed.  HENT:     Head: Normocephalic and atraumatic.     Mouth/Throat:     Pharynx: Oropharynx is clear.  Eyes:     Extraocular Movements: Extraocular movements intact.     Pupils: Pupils are equal, round, and reactive to light.  Cardiovascular:     Rate and Rhythm: Regular rhythm. Tachycardia present.  Pulmonary:     Comments: Decreased breath sounds bilaterally.  Abdominal:     Palpations: Abdomen is soft.  Musculoskeletal:        General: Normal range of motion.     Cervical back: Normal range of motion.  Skin:    General: Skin is warm.  Neurological:      General: No focal deficit present.     Mental Status: She is alert and oriented to person, place, and time.  Psychiatric:        Behavior: Behavior normal.        Judgment: Judgment normal.     LABORATORY DATA:  I have reviewed the data as listed Lab Results  Component Value Date   WBC 0.3 (LL) 04/15/2021   HGB 11.3 (L) 04/15/2021   HCT 33.4 (L) 04/15/2021   MCV 87.2 04/15/2021   PLT 134 (L) 04/15/2021   Recent Labs    03/29/21 0805 04/08/21 0817 04/15/21 0626  NA 129* 131* 123*  K 3.8 3.7 3.7  CL 94* 97* 88*  CO2 27 27 26   GLUCOSE 125* 101* 96  BUN 12 19 9   CREATININE 0.73 0.62 0.49  CALCIUM 9.1 8.7* 9.0  GFRNONAA >60 >60 >60  PROT 5.8* 6.2* 6.2*  ALBUMIN 3.8 3.6 3.5  AST 23 22 23   ALT 13 15 14   ALKPHOS 98 88 88  BILITOT 0.1* 0.6 0.7    RADIOGRAPHIC STUDIES: I have personally reviewed the radiological images as listed and agreed with the findings in the report. CT ABDOMEN PELVIS W CONTRAST  Result Date: 04/15/2021 CLINICAL DATA:  Abdominal distension and bloating. EXAM: CT ABDOMEN AND PELVIS WITH CONTRAST TECHNIQUE: Multidetector CT imaging of the abdomen and pelvis was performed using the standard protocol following bolus administration of intravenous contrast. CONTRAST:  88mL OMNIPAQUE IOHEXOL 300 MG/ML  SOLN COMPARISON:  01/14/2021 FINDINGS: Lower chest: Scar versus atelectasis identified within both lung bases. No pleural effusion. Hepatobiliary: No suspicious liver abnormality. Gallbladder appears distended. No gallstones or gallbladder wall thickening. No bile duct dilatation. Pancreas: Unremarkable. No pancreatic ductal dilatation or surrounding inflammatory changes. Spleen: Normal in size without focal abnormality. Adrenals/Urinary Tract: Normal adrenal glands. No kidney mass or hydronephrosis. Urinary bladder is unremarkable. Stomach/Bowel: Small hiatal hernia. Colonic diverticulosis identified involving the sigmoid colon. Wall thickening and inflammation  of the sigmoid colon is identified. Inflammatory changes are identified within the pelvis including  fat stranding and multiple small interloop fluid collections concerning for perforation with early abscess formation. Within the left pelvic sidewall there is a fluid collection measuring 3.5 x 1.6 cm, image 66/2. Within the posterior pelvis there is a fluid collection measuring 3.4 x 1.4 cm, image 65/2. Signs bowel obstruction vs ileus identified with multiple dilated loops of small bowel with air-fluid levels measuring up to 3.7 cm. There is a transition to decreased caliber distal small bowel loops within the right lower quadrant of the abdomen. Terminal ileum appears decompressed. The colon is distended up to the level of the mid descending colon measuring up to 6.7 cm in diameter. Vascular/Lymphatic: Aortic atherosclerosis. No abdominopelvic adenopathy. Reproductive: Status post hysterectomy. No adnexal masses. Other: Fat containing left inguinal hernia. Musculoskeletal: No acute or significant osseous findings. IMPRESSION: 1. Examination is positive for acute sigmoid diverticulitis. Two small interloop fluid collections are identified within the pelvis concerning for perforation with early abscess formation. 2. Signs of bowel obstruction and/or ileus identified with transition to decreased caliber distal small bowel loops within the right lower quadrant of the abdomen. 3. Aortic Atherosclerosis (ICD10-I70.0). Critical Value/emergent results were called by telephone at the time of interpretation on 04/15/2021 at 6:39 am to provider [facility] , who verbally acknowledged these results. Electronically Signed   By: Kerby Moors M.D.   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  # Diffuse large B-cell lymphoma-stage III-currently s/p cycle #5 of R-CHOP chemotherapy; day # 7 today.  Post 3 cycles patient had significant clinical response from chemotherapy.  We will hold further chemotherapy while awaiting improvement of current acute clinically issues [see below].  #Severe neutropenia-secondary chemotherapy-s/p long-acting growth factor week ago.    # Acute sigmoid diverticulitis: On IV antibiotics s/p evaluation with surgery.   # PLAN:  #Recommend holding further chemotherapy at this time given above complications of acute diverticulitis.  #Recommend holding further growth factors [Neupogen or Granix]-as patient received long-acting growth factor.  Patient's counts should start recording in the next few days.  #With regards to acute diverticulitis-given patient's overall good prognosis [and also significant clinical response after 3 cycles of chemotherapy], I agree with aggressive care at this time.  I discussed this with Dr. Peyton Najjar.  Thank you Dr. Francine Graven for allowing me to participate in the care of your pleasant patient. Please do not hesitate to contact me with questions or concerns in the interim.  All questions were answered. The patient knows to call the clinic with any problems, questions or concerns.    [name], MD 04/15/2021 2:18 PM

## 2021-04-15 NOTE — Progress Notes (Signed)
CODE SEPSIS - PHARMACY COMMUNICATION  **Broad Spectrum Antibiotics should be administered within 1 hour of Sepsis diagnosis**  Time Code Sepsis Called/Page Received: 1114 0650  Antibiotics Ordered: cefepime, metro  Time of 1st antibiotic administration: [phone]  Additional action taken by pharmacy:    If necessary, Name of Provider/Nurse Contacted:      Noralee Space ,PharmD Clinical Pharmacist  04/15/2021  7:49 AM

## 2021-04-15 NOTE — H&P (Addendum)
History and Physical    Mary Washington ZOX:[id number] DOB: Nov 12, 1945 DOA: 04/15/2021  PCP: [name], MD   Patient coming from: Home  I have personally briefly reviewed patient's old medical records in [place]  Chief Complaint: Abdominal distention  HPI: Marijose Curington is a 75 y.o. 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 Patient states that she gets chemotherapy every 3 weeks and her last treatment was 1 week ago on 11/07.  She states that following the treatment she developed constipation and had to take several days of MiraLAX without any significant improvement.  She called over to her oncologist office and recommended she use a suppository which she did with minimal improvement and then she had to call the office hotline and was recommended lactulose.  She states that she has had multiple episodes of small loose stools but continues to feel bloated and uncomfortable.  She has had nausea and anorexia but denies having any emesis.  She is passing flatus. She denies having any fever or chills, no abdominal pain, no urinary symptoms, no cough, no shortness of breath, no chest pain, no dizziness, no lightheadedness, no palpitations, no lower extremity swelling, no blurred vision or focal deficit. Labs show sodium 123, potassium 3.7, chloride 88, bicarb 26, glucose 96, BUN 9, creatinine 0.49, calcium 9.0, alkaline phosphatase 88, albumin 3.5, lipase 20, AST 23, ALT 14, total protein 6.2, lactic acid 1.5, Respiratory viral panel is negative Urinalysis is sterile CT scan of abdomen and pelvis is positive for acute sigmoid diverticulitis. Two small interloop fluid collections are identified within the pelvis concerning for perforation with early abscess formation. Signs of bowel obstruction and/or ileus  identified with transition to decreased caliber distal small bowel loops within the right lower quadrant of the abdomen.Aortic Atherosclerosis.  ED Course: Patient is a 75 year old female who presents to the emergency room for evaluation of abdominal bloating and diarrhea.  She has taken multiple laxatives for constipation and developed diarrhea without any improvement in her abdominal symptoms. Imaging shows acute diverticulitis with bowel perforation/ early abscess formation as well as signs of bowel obstruction. She received antibiotic therapy in the ER and will be admitted to the hospital for further evaluation.   Review of Systems: As per HPI otherwise all other systems reviewed and negative.    Past Medical History:  Diagnosis Date   Colon polyps    Diverticulosis    Melanoma ([place]) 1988   lt upper arm, some lymph nodes removed   Mild intermittent asthma    Osteoporosis     Past Surgical History:  Procedure Laterality Date   BACK SURGERY     CATARACT EXTRACTION     IR IMAGING GUIDED PORT INSERTION  11/23/2020   TONSILLECTOMY     VAGINAL HYSTERECTOMY  1990     reports that she has never smoked. She has never used smokeless tobacco. She reports that she does not drink alcohol and does not use drugs.  No Known Allergies  Family History  Problem Relation Age of Onset   Breast cancer Neg Hx       Prior to Admission medications   Medication Sig Start Date End Date Taking? Authorizing Provider  acetaminophen (TYLENOL) 500 MG tablet Take 500 mg by mouth every 6 (six) hours as needed.   Yes [provider]  acyclovir (ZOVIRAX) 400 MG  tablet Take 1 tablet (400 mg total) by mouth 2 (two) times daily. To prevent shingles 03/29/21  Yes [name], MD  Cholecalciferol (VITAMIN D) 50 MCG (2000 UT) CAPS Take 1 capsule by mouth daily.   Yes [provider]  lidocaine-prilocaine (EMLA) cream lidocaine-prilocaine 2.5 %-2.5 % topical cream  APPLY TOPICALLY  TO THE AFFECTED AREA 1 TIME   Yes [provider]  Magnesium Oxide (MAG-OXIDE PO) Take 1 capsule by mouth daily at 12 noon.   Yes [provider]  meloxicam (MOBIC) 15 MG tablet Take 1 tablet by mouth daily. 04/05/21  Yes [provider]  montelukast (SINGULAIR) 10 MG tablet TAKE 1 TABLET(10 MG) BY MOUTH AT BEDTIME. START 2 DAYS BEFORE INFUSION. TAKE IT FOR 4 DAYS 01/24/21  Yes [name], MD  Multiple Vitamins-Minerals (WOMENS MULTIVITAMIN PO) Take by mouth.   Yes [provider]  omeprazole (PRILOSEC) 20 MG capsule Take 20 mg by mouth 2 (two) times daily.   Yes [provider]  potassium chloride SA (KLOR-CON) 20 MEQ tablet TAKE 1 TABLET BY MOUTH TWICE DAILY 03/18/21  Yes [name], MD  traMADol (ULTRAM) 50 MG tablet Take 1 tablet by mouth every 6 (six) hours as needed. 11/08/20  Yes [provider]  allopurinol (ZYLOPRIM) 300 MG tablet TAKE 1 TABLET(300 MG) BY MOUTH TWICE DAILY TO AVOID GOUT FLARE UP WHILE ON CHEMO Patient not taking: No sig reported 02/18/21   [name], MD  benzonatate (TESSALON PERLES) 100 MG capsule Take 1 capsule (100 mg total) by mouth 3 (three) times daily as needed for cough. Patient not taking: No sig reported 01/15/21 01/15/22  Duffy Bruce, MD  lactulose (CHRONULAC) 10 GM/15ML solution Take 15 mLs (10 g total) by mouth 2 (two) times daily as needed for severe constipation. Patient not taking: No sig reported 04/13/21   Verlon Au, NP  ondansetron (ZOFRAN) 8 MG tablet Take 1 tablet (8 mg total) by mouth 2 (two) times daily as needed for refractory nausea / vomiting. Start on day 3 after cyclophosphamide chemotherapy. Patient not taking: No sig reported 11/26/20   [name], MD  predniSONE (DELTASONE) 20 MG tablet Take 1 pill a day in the morning with breakfast x 3 more days; and then 1/2 a pill a day. Do not stop until further instructions. Patient not taking: No sig  reported 02/06/21   [name], MD  predniSONE (DELTASONE) 50 MG tablet Take 2 tablets (100 mg total) by mouth daily with breakfast. Patient not taking: Reported on 04/15/2021 12/28/20   [name], MD  prochlorperazine (COMPAZINE) 10 MG tablet Take 1 tablet (10 mg total) by mouth every 6 (six) hours as needed (Nausea or vomiting). Patient not taking: No sig reported 11/26/20   [name], MD  scopolamine (TRANSDERM-SCOP) 1 MG/3DAYS Place 1 patch onto the skin 3 days. Patient not taking: No sig reported    [provider]  traMADol (ULTRAM) 50 MG tablet Take 1 tablet (50 mg total) by mouth every 12 (twelve) hours as needed for moderate pain. Patient not taking: No sig reported 08/25/20   Sable Feil, PA-C  loratadine (CLARITIN) 10 MG tablet Take 1 tablet by mouth as needed.  11/23/20  [provider]    Physical Exam: Vitals:   04/15/21 0531 04/15/21 0830 04/15/21 0845 04/15/21 1000  BP:  (!) 152/71  (!) 142/63  Pulse:  (!) 107 (!) 109 (!) 103  Resp:  18    Temp:      TempSrc:      SpO2:  98% 100% 99%  Weight: 51.7 kg     Height: 5\' 2"  (1.575 m)        Vitals:   04/15/21 0531 04/15/21 0830 04/15/21 0845 04/15/21 1000  BP:  (!) 152/71  (!) 142/63  Pulse:  (!) 107 (!) 109 (!) 103  Resp:  18    Temp:      TempSrc:      SpO2:  98% 100% 99%  Weight: 51.7 kg     Height: 5\' 2"  (1.575 m)         Constitutional: Alert and oriented x 3 . Not in any apparent distress HEENT:      Head: Normocephalic and atraumatic.         Eyes: PERLA, EOMI, Conjunctivae are normal. Sclera is non-icteric.       Mouth/Throat: Mucous membranes are dry.       Neck: Supple with no signs of meningismus. Cardiovascular: Tachycardia. No murmurs, gallops, or rubs. 2+ symmetrical distal pulses are present . No JVD. No LE edema Respiratory: Respiratory effort normal .Lungs sounds clear bilaterally. No wheezes, crackles, or rhonchi.  Gastrointestinal: Soft, no  tenderness in both lower quadrants, and non distended with hypoactive bowel sounds.  Genitourinary: No CVA tenderness. Musculoskeletal: Nontender with normal range of motion in all extremities. No cyanosis, or erythema of extremities. Neurologic:  Face is symmetric. Moving all extremities. No gross focal neurologic deficits . Skin: Skin is warm, dry.  No rash or ulcers Psychiatric: Mood and affect are normal    Labs on Admission: I have personally reviewed following labs and imaging studies  CBC: Recent Labs  Lab 04/15/21 0540  WBC 0.3*  NEUTROABS 0.1*  HGB 11.3*  HCT 33.4*  MCV 87.2  PLT 540*   Basic Metabolic Panel: Recent Labs  Lab 04/15/21 0626  NA 123*  K 3.7  CL 88*  CO2 26  GLUCOSE 96  BUN 9  CREATININE 0.49  CALCIUM 9.0   GFR: Estimated Creatinine Clearance: 48.8 mL/min (by C-G formula based on SCr of 0.49 mg/dL). Liver Function Tests: Recent Labs  Lab 04/15/21 0626  AST 23  ALT 14  ALKPHOS 88  BILITOT 0.7  PROT 6.2*  ALBUMIN 3.5   Recent Labs  Lab 04/15/21 0626  LIPASE 20   No results for input(s): AMMONIA in the last 168 hours. Coagulation Profile: No results for input(s): INR, PROTIME in the last 168 hours. Cardiac Enzymes: No results for input(s): CKTOTAL, CKMB, CKMBINDEX, TROPONINI in the last 168 hours. BNP (last 3 results) No results for input(s): PROBNP in the last 8760 hours. HbA1C: No results for input(s): HGBA1C in the last 72 hours. CBG: No results for input(s): GLUCAP in the last 168 hours. Lipid Profile: No results for input(s): CHOL, HDL, LDLCALC, TRIG, CHOLHDL, LDLDIRECT in the last 72 hours. Thyroid Function Tests: No results for input(s): TSH, T4TOTAL, FREET4, T3FREE, THYROIDAB in the last 72 hours. Anemia Panel: No results for input(s): VITAMINB12, FOLATE, FERRITIN, TIBC, IRON, RETICCTPCT in the last 72 hours. Urine analysis:    Component Value Date/Time   COLORURINE YELLOW (A) 04/15/2021 0815   APPEARANCEUR CLEAR (A)  04/15/2021 0815   LABSPEC 1.040 (H) 04/15/2021 0815   PHURINE 7.0 04/15/2021 0815   GLUCOSEU NEGATIVE 04/15/2021 0815   HGBUR SMALL (A) 04/15/2021 0815   BILIRUBINUR NEGATIVE 04/15/2021 0815   KETONESUR 5 (A) 04/15/2021 0815   PROTEINUR  NEGATIVE 04/15/2021 0815   NITRITE NEGATIVE 04/15/2021 0815   LEUKOCYTESUR NEGATIVE 04/15/2021 0815    Radiological Exams on Admission: CT ABDOMEN PELVIS W CONTRAST  Result Date: 04/15/2021 CLINICAL DATA:  Abdominal distension and bloating. EXAM: CT ABDOMEN AND PELVIS WITH CONTRAST TECHNIQUE: Multidetector CT imaging of the abdomen and pelvis was performed using the standard protocol following bolus administration of intravenous contrast. CONTRAST:  8mL OMNIPAQUE IOHEXOL 300 MG/ML  SOLN COMPARISON:  01/14/2021 FINDINGS: Lower chest: Scar versus atelectasis identified within both lung bases. No pleural effusion. Hepatobiliary: No suspicious liver abnormality. Gallbladder appears distended. No gallstones or gallbladder wall thickening. No bile duct dilatation. Pancreas: Unremarkable. No pancreatic ductal dilatation or surrounding inflammatory changes. Spleen: Normal in size without focal abnormality. Adrenals/Urinary Tract: Normal adrenal glands. No kidney mass or hydronephrosis. Urinary bladder is unremarkable. Stomach/Bowel: Small hiatal hernia. Colonic diverticulosis identified involving the sigmoid colon. Wall thickening and inflammation of the sigmoid colon is identified. Inflammatory changes are identified within the pelvis including fat stranding and multiple small interloop fluid collections concerning for perforation with early abscess formation. Within the left pelvic sidewall there is a fluid collection measuring 3.5 x 1.6 cm, image 66/2. Within the posterior pelvis there is a fluid collection measuring 3.4 x 1.4 cm, image 65/2. Signs bowel obstruction vs ileus identified with multiple dilated loops of small bowel with air-fluid levels measuring up to 3.7  cm. There is a transition to decreased caliber distal small bowel loops within the right lower quadrant of the abdomen. Terminal ileum appears decompressed. The colon is distended up to the level of the mid descending colon measuring up to 6.7 cm in diameter. Vascular/Lymphatic: Aortic atherosclerosis. No abdominopelvic adenopathy. Reproductive: Status post hysterectomy. No adnexal masses. Other: Fat containing left inguinal hernia. Musculoskeletal: No acute or significant osseous findings. IMPRESSION: 1. Examination is positive for acute sigmoid diverticulitis. Two small interloop fluid collections are identified within the pelvis concerning for perforation with early abscess formation. 2. Signs of bowel obstruction and/or ileus identified with transition to decreased caliber distal small bowel loops within the right lower quadrant of the abdomen. 3. Aortic Atherosclerosis (ICD10-I70.0). Critical Value/emergent results were called by telephone at the time of interpretation on 04/15/2021 at 6:39 am to provider [facility] , who verbally acknowledged these results. Electronically Signed   By: Kerby Moors M.D.   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   Acute diverticulitis with perforation/abscess/SBO Will place patient empirically on antibiotic therapy with Zosyn Keep patient n.p.o. No need for an NG tube at this time Supportive care with antiemetics, IV fluid and pain control Consult surgery     Hyponatremia Most likely secondary to volume depletion from poor oral intake Hydrate patient with normal saline Repeat sodium levels  in a.m.    Antineoplastic chemotherapy induced pancytopenia Patient has a WBC of 0.3 and an ANC of 480. She recently completed chemotherapy and received a dose of granulocyte colony-stimulating factor. We will continue to monitor cell lines closely We will consult oncology    History of large cell lymphoma On chemotherapy Follow-up with oncology as an outpatient   DVT prophylaxis: SCD  Code Status: full code  Family Communication: Greater than 50% of time was spent discussing patient's condition and plan of care with her and her husband at the bedside.  All questions and concerns have been addressed.  They verbalized understanding and agree with the plan.   Disposition Plan: Back to previous home environment Consults called: Oncology/surgery Status:At the time of admission, it appears that the appropriate admission status for this patient is inpatient. This is judged to be reasonable and necessary to provide the required intensity of service to ensure the patient's safety given the presenting symptoms, physical exam findings, and initial radiographic and laboratory data in the context of their comorbid conditions. Patient requires inpatient status due to high intensity of service, high risk for further deterioration and high frequency of surveillance required.     Collier Bullock MD [facility]     04/15/2021, 10:44 AM

## 2021-04-15 NOTE — ED Notes (Signed)
Report given to Jenn RN

## 2021-04-15 NOTE — ED Triage Notes (Signed)
Pt presents to ER c/o issues with bowel movements.  Pt states she was prescribed laxatives 2 days ago and they were working, but she states she has still been having some bloating.  Pt is currently on chemo for non-hodgkin's lymphoma but does not take chromic pain meds.

## 2021-04-15 NOTE — ED Notes (Signed)
Right port accessed at this time. Pot flushed and good blood return. Bio patch placed and tegaderm. Date, time and initials placed on label.

## 2021-04-15 NOTE — ED Provider Notes (Signed)
[facility] Emergency Department Provider Note  ____________________________________________   Event Date/Time   First MD Initiated Contact with Patient 04/15/21 0533     (approximate)  I have reviewed the triage vital signs and the nursing notes.   HISTORY  Chief Complaint Bloated and Abdominal Pain   HPI Mary Washington is a 75 y.o. female with past medical history of osteoporosis, diverticulosis and non-Hodgkin's lymphoma currently undergoing outpatient chemotherapy with last infusion session on 11/7 who presents for assessment of some abdominal pain.  Patient states after her last chemoinfusion she was constipated and had been taking some MiraLAX over the last couple of days and started to have bowel movements but feels that her abdominal bloating has gotten worse.  She feels generally weak with very poor appetite but has not had significant vomiting.  She states she only has abdominal pain when someone is pushing on her abdomen.  She endorses some chronic back pain has not changed significantly today.  She denies any urinary symptoms, fevers, headache, earache, sore throat, rash, chest pain, cough, shortness of breath or other clear associated sick symptoms.  No other acute concerns at this time.         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 Authorizing Provider  acetaminophen (TYLENOL) 500 MG tablet Take 500 mg by mouth every 6 (six) hours as needed. Patient not taking: No sig reported    [provider]  acyclovir (ZOVIRAX) 400 MG tablet Take 1 tablet (400 mg total) by mouth 2 (two) times daily. To prevent shingles 03/29/21   [name], MD  allopurinol (ZYLOPRIM) 300 MG tablet TAKE 1 TABLET(300 MG) BY MOUTH TWICE DAILY TO AVOID GOUT FLARE UP WHILE ON CHEMO Patient not taking: Reported on 04/08/2021 02/18/21   [name], MD  benzonatate (TESSALON PERLES) 100 MG capsule Take 1 capsule (100 mg total) by mouth 3 (three) times daily as needed for cough. Patient not taking: No sig reported 01/15/21 01/15/22  Duffy Bruce, MD  Cholecalciferol (VITAMIN D) 50 MCG (2000 UT) CAPS Take 1 capsule by mouth daily.    [provider]  lactulose (CHRONULAC) 10 GM/15ML solution Take 15 mLs (10 g total) by mouth 2 (two) times daily as needed for severe constipation. 04/13/21   Verlon Au, NP  lidocaine-prilocaine (EMLA) cream lidocaine-prilocaine 2.5 %-2.5 % topical cream  APPLY TOPICALLY TO THE AFFECTED AREA 1 TIME    [provider]  Magnesium Oxide (MAG-OXIDE PO) Take 1 capsule by mouth daily at 12 noon.    [provider]  meloxicam (MOBIC) 15 MG tablet Take 1 tablet by mouth daily. 09/21/20   [provider]  montelukast (SINGULAIR) 10 MG tablet TAKE 1 TABLET(10 MG) BY  MOUTH AT BEDTIME. START 2 DAYS BEFORE INFUSION. TAKE IT FOR 4 DAYS 01/24/21   [name], MD  Multiple Vitamins-Minerals (WOMENS MULTIVITAMIN PO) Take by mouth.    [provider]  omeprazole (PRILOSEC) 20 MG capsule Take 20 mg by mouth 2 (two) times daily.    [provider]  ondansetron (ZOFRAN) 8 MG tablet Take 1 tablet (8 mg total) by mouth 2 (two) times daily as needed for refractory nausea / vomiting. Start on day 3 after cyclophosphamide chemotherapy. Patient not  taking: No sig reported 11/26/20   [name], MD  potassium chloride SA (KLOR-CON) 20 MEQ tablet TAKE 1 TABLET BY MOUTH TWICE DAILY 03/18/21   [name], MD  predniSONE (DELTASONE) 20 MG tablet Take 1 pill a day in the morning with breakfast x 3 more days; and then 1/2 a pill a day. Do not stop until further instructions. 02/06/21   [name], MD  predniSONE (DELTASONE) 50 MG tablet Take 2 tablets (100 mg total) by mouth daily with breakfast. 12/28/20   [name], MD  prochlorperazine (COMPAZINE) 10 MG tablet Take 1 tablet (10 mg total) by mouth every 6 (six) hours as needed (Nausea or vomiting). Patient not taking: Reported on 04/08/2021 11/26/20   [name], MD  traMADol (ULTRAM) 50 MG tablet Take 1 tablet (50 mg total) by mouth every 12 (twelve) hours as needed for moderate pain. 08/25/20   Sable Feil, PA-C  loratadine (CLARITIN) 10 MG tablet Take 1 tablet by mouth as needed.  11/23/20  [provider]    Allergies Patient has no known allergies.  Family History  Problem Relation Age of Onset   Breast cancer Neg Hx     Social History Social History   Tobacco Use   Smoking status: Never   Smokeless tobacco: Never  Substance Use Topics   Alcohol use: Never   Drug use: Never    Review of Systems  Review of Systems  Constitutional:  Positive for malaise/fatigue. Negative for chills and fever.  HENT:  Negative for sore throat.   Eyes:  Negative for pain.  Respiratory:  Negative for cough and stridor.   Cardiovascular:  Negative for chest pain.  Gastrointestinal:  Positive for abdominal pain and nausea. Negative for vomiting.  Genitourinary:  Negative for dysuria.  Musculoskeletal:  Positive for back pain (chronic).  Skin:  Negative for rash.  Neurological:  Positive for weakness. Negative for seizures, loss of consciousness and headaches.  Psychiatric/Behavioral:  Negative for suicidal ideas.   All other  systems reviewed and are negative.    ____________________________________________   PHYSICAL EXAM:  VITAL SIGNS: ED Triage Vitals  Enc Vitals Group     BP 04/15/21 0525 (!) 146/64     Pulse Rate 04/15/21 0525 (!) 129     Resp 04/15/21 0525 18     Temp 04/15/21 0525 98.6 F (37 C)     Temp Source 04/15/21 0525 Oral     SpO2 04/15/21 0525 98 %     Weight 04/15/21 0531 114 lb (51.7 kg)     Height 04/15/21 0531 5\' 2"  (1.575 m)     Head Circumference --      Peak Flow --      Pain Score 04/15/21 0527 0     Pain Loc --      Pain Edu? --      Excl. in [place]? --    Vitals:   04/15/21 0525  BP: (!) 146/64  Pulse: (!) 129  Resp: 18  Temp: 98.6 F (37 C)  SpO2: 98%   Physical Exam Vitals and nursing note reviewed.  Constitutional:      General: She is not in acute distress.    Appearance: She is well-developed. She is ill-appearing.  HENT:     Head: Normocephalic and atraumatic.     Right Ear: External ear normal.     Left Ear: External ear normal.     Nose: Nose normal.     Mouth/Throat:     Mouth: Mucous membranes are dry.  Eyes:     Conjunctiva/sclera: Conjunctivae normal.  Cardiovascular:     Rate and Rhythm: Regular rhythm. Tachycardia present.     Heart sounds: No murmur heard. Pulmonary:     Effort: Pulmonary effort is normal. No respiratory distress.     Breath sounds: Normal breath sounds.  Abdominal:     General: There is distension.     Palpations: Abdomen is soft.     Tenderness: There is generalized abdominal tenderness. There is no right CVA tenderness or left CVA tenderness.  Musculoskeletal:     Cervical back: Neck supple.  Skin:    General: Skin is warm and dry.     Capillary Refill: Capillary refill takes 2 to 3 seconds.  Neurological:     Mental Status: She is alert and oriented to person, place, and time.  Psychiatric:        Mood and Affect: Mood normal.     ____________________________________________   LABS (all labs ordered are  listed, but only abnormal results are displayed)  Labs Reviewed  CBC WITH DIFFERENTIAL/PLATELET - Abnormal; Notable for the following components:      Result Value   WBC 0.3 (*)    RBC 3.83 (*)    Hemoglobin 11.3 (*)    HCT 33.4 (*)    RDW 15.9 (*)    Platelets 134 (*)    Neutro Abs 0.1 (*)    Lymphs Abs 0.1 (*)    Monocytes Absolute 0.0 (*)    All other components within normal limits  COMPREHENSIVE METABOLIC PANEL - Abnormal; Notable for the following components:   Sodium 123 (*)    Chloride 88 (*)    Total Protein 6.2 (*)    All other components within normal limits  URINE CULTURE  CULTURE, BLOOD (SINGLE)  CULTURE, BLOOD (SINGLE)  RESP PANEL BY RT-PCR (FLU A&B, COVID) ARPGX2  LACTIC ACID, PLASMA  LIPASE, BLOOD  LACTIC ACID, PLASMA  URINALYSIS, COMPLETE (UACMP) WITH MICROSCOPIC  PATHOLOGIST SMEAR REVIEW   ____________________________________________  EKG  ____________________________________________  RADIOLOGY  ED MD interpretation: CT Abdo pelvis remarkable for acute sigmoid diverticulitis with 2 areas concerning for perforation and early abscess formation.  There is signs of bowel obstruction or ileus with transition to decreased caliber bowel loops within the right lower quadrant of the abdomen.  There is also evidence of aortic atherosclerosis.  No evidence of cholecystitis, pancreatitis, appendicitis kidney stone, perinephric stranding or other acute abdominal pelvic process.  Official radiology report(s): CT ABDOMEN PELVIS W CONTRAST  Result Date: 04/15/2021 CLINICAL DATA:  Abdominal distension and bloating. EXAM: CT ABDOMEN AND PELVIS WITH CONTRAST TECHNIQUE: Multidetector CT imaging of the abdomen and pelvis was performed using the standard protocol following bolus administration of intravenous contrast. CONTRAST:  20mL OMNIPAQUE IOHEXOL 300 MG/ML  SOLN COMPARISON:  01/14/2021 FINDINGS: Lower chest: Scar versus atelectasis identified within both lung bases. No  pleural effusion. Hepatobiliary: No  suspicious liver abnormality. Gallbladder appears distended. No gallstones or gallbladder wall thickening. No bile duct dilatation. Pancreas: Unremarkable. No pancreatic ductal dilatation or surrounding inflammatory changes. Spleen: Normal in size without focal abnormality. Adrenals/Urinary Tract: Normal adrenal glands. No kidney mass or hydronephrosis. Urinary bladder is unremarkable. Stomach/Bowel: Small hiatal hernia. Colonic diverticulosis identified involving the sigmoid colon. Wall thickening and inflammation of the sigmoid colon is identified. Inflammatory changes are identified within the pelvis including fat stranding and multiple small interloop fluid collections concerning for perforation with early abscess formation. Within the left pelvic sidewall there is a fluid collection measuring 3.5 x 1.6 cm, image 66/2. Within the posterior pelvis there is a fluid collection measuring 3.4 x 1.4 cm, image 65/2. Signs bowel obstruction vs ileus identified with multiple dilated loops of small bowel with air-fluid levels measuring up to 3.7 cm. There is a transition to decreased caliber distal small bowel loops within the right lower quadrant of the abdomen. Terminal ileum appears decompressed. The colon is distended up to the level of the mid descending colon measuring up to 6.7 cm in diameter. Vascular/Lymphatic: Aortic atherosclerosis. No abdominopelvic adenopathy. Reproductive: Status post hysterectomy. No adnexal masses. Other: Fat containing left inguinal hernia. Musculoskeletal: No acute or significant osseous findings. IMPRESSION: 1. Examination is positive for acute sigmoid diverticulitis. Two small interloop fluid collections are identified within the pelvis concerning for perforation with early abscess formation. 2. Signs of bowel obstruction and/or ileus identified with transition to decreased caliber distal small bowel loops within the right lower quadrant of the  abdomen. 3. Aortic Atherosclerosis (ICD10-I70.0). Critical Value/emergent results were called by telephone at the time of interpretation on 04/15/2021 at 6:39 am to provider [facility] , who verbally acknowledged these results. Electronically Signed   By: Kerby Moors M.D.   On: 04/15/2021 06:42    ____________________________________________   PROCEDURES  Procedure(s) performed (including Critical Care):  .Critical Care Performed by: Lucrezia Starch, MD Authorized by: Lucrezia Starch, MD   Critical care provider statement:    Critical care time (minutes):  30   Critical care was necessary to treat or prevent imminent or life-threatening deterioration of the following conditions:  Sepsis   Critical care was time spent personally by me on the following activities:  Development of treatment plan with patient or surrogate, discussions with consultants, evaluation of patient's response to treatment, examination of patient, ordering and review of laboratory studies, ordering and review of radiographic studies, ordering and performing treatments and interventions, pulse oximetry, re-evaluation of patient's condition and review of old charts   I assumed direction of critical care for this patient from another provider in my specialty: no     Care discussed with: admitting provider     ____________________________________________   INITIAL IMPRESSION / ASSESSMENT AND PLAN / ED COURSE      Patient presents with above-stated history exam for assessment of some abdominal bloating associate with some nausea, generalized weakness, decreased p.o. intake and only now starting to have some very loose bowel movements after lactulose over the weekend in the context of receiving chemotherapy for non-Hodgkin's lymphoma.  On arrival patient is tachycardic and hypertensive with otherwise stable vital signs on room air.  She does seem dehydrated and is noted be tachycardic on exam.  She has mild  tenderness throughout her abdomen but no guarding.  Differential includes diverticulitis, appendicitis, cholecystitis, pancreatitis, cystitis, partial SBO versus metabolic derangements and dehydration.  CMP remarkable for NA 123, chloride of 88 without other significant electrolyte  or metabolic derangements.  CBC remarkable for neutropenia with WBC count of 0.3 and absolute neutrophil count of 0.1 with otherwise hemoglobin of 11.3 and platelets of 134.  Lactic acid is nonelevated.  Lipase of 20 is not consistent with pancreatitis.   CT Abdo pelvis remarkable for acute sigmoid diverticulitis with 2 areas concerning for perforation and early abscess formation.  There is signs of bowel obstruction or ileus with transition to decreased caliber bowel loops within the right lower quadrant of the abdomen.  There is also evidence of aortic atherosclerosis.  No evidence of cholecystitis, pancreatitis, appendicitis kidney stone, perinephric stranding or other acute abdominal pelvic process.  Given concerns of perforated diverticulitis patient treated with broad-spectrum antibiotics and 2 times fluid boluses with concerns for sepsis.  Surgery consulted and I spoke with Dr. Peyton Najjar who recommended medical management but that he would see the patient.  I will admit to medicine service for further evaluation and management.     ____________________________________________   FINAL CLINICAL IMPRESSION(S) / ED DIAGNOSES  Final diagnoses:  Diverticulitis large intestine w/o perforation or abscess w/o bleeding  Chemotherapy-induced neutropenia (HCC)  Hyponatremia  Sepsis, due to unspecified organism, unspecified whether acute organ dysfunction present (HCC)    Medications  metroNIDAZOLE (FLAGYL) IVPB 500 mg (has no administration in time range)  acetaminophen (OFIRMEV) IV 1,000 mg (has no administration in time range)  lactated ringers bolus 1,000 mL (has no administration in time range)  lactated  ringers bolus 1,000 mL (0 mLs Intravenous Stopped 04/15/21 0743)  iohexol (OMNIPAQUE) 300 MG/ML solution 80 mL (80 mLs Intravenous Contrast Given 04/15/21 0610)  ceFEPIme (MAXIPIME) 2 g in sodium chloride 0.9 % 100 mL IVPB (2 g Intravenous New Bag/Given 04/15/21 0743)     ED Discharge Orders     None        Note:  This document was prepared using Dragon voice recognition software and may include unintentional dictation errors.    Lucrezia Starch, MD 04/15/21 [phone]

## 2021-04-15 NOTE — Assessment & Plan Note (Addendum)
#   75 year old female patient with a history of diffuse B-cell lymphoma-currently s/p R-CHOP chemotherapy is currently admitted to hospital for sigmoid diverticulitis/suspected perforation/and severe neutropenia.  # Diffuse large B-cell lymphoma-stage III-currently s/p cycle #5 of R-CHOP chemotherapy; day # 8 today.  Post 3 cycles patient had significant clinical response from chemotherapy.  We will hold further chemotherapy while awaiting improvement of current acute clinically issues [see below].  # Severe neutropenia-secondary chemotherapy-s/p long-acting growth factor post chemotherapy; today white count is 5 ANCs 3.2.    # Acute sigmoid diverticulitis/with suspected perforation-partial bowel obstruction/ileus secondary to inflammation.: On IV antibiotics s/p evaluation with surgery.  Given clinical stability-conservative approach at this time.  However surgical option to be reevaluated on Monday.  #Severe electrolyte abnormalities-supplemented/monitored by hospitalist service closely.  Agree with TPN.  Discussed with Dr. Peyton Najjar.

## 2021-04-15 NOTE — ED Provider Notes (Signed)
-----------------------------------------   6:52 AM on 04/15/2021 -----------------------------------------  Received call from radiology that the patient has free air in the abdomen and what appears to be multiple perforated diverticula.  I informed the patient and husband, initiated full sepsis work-up including cefepime 2 g IV and metronidazole 500 mg IV, and asked the nursing staff for the next available bed.   Hinda Kehr, MD 04/15/21 [phone]

## 2021-04-15 NOTE — ED Notes (Signed)
Patient to waiting room via wheelchair by EMS from home.Per EMS patient was constipated and took a laxative, had a bowel movement and would like to have her electrolytes checked and still feels bloated.  EMS interventions -- bp 164/80, hr -110, pulse oxi 96% on room air. Currently receiving treatment for lymphoma

## 2021-04-15 NOTE — Consult Note (Signed)
SURGICAL CONSULTATION NOTE   HISTORY OF PRESENT ILLNESS (HPI):  75 y.o. female presented to [facility] ED for evaluation of bloating and abdominal distention. Patient reports feeling bloated for the last 2 to 3 days.  Initially she thought that it was constipation she use MiraLAX and lactulose as per recommendation by the cancer center patient line.  Due to progressive bloating she came to the ED.  She endorses having some nausea but no vomiting.  She has not passed any significant bowel movement but endorses passing gas.  At the ED she was found tachycardic with neutropenia.  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  Last chemotherapy last week.  Lactic acid within normal limits.  Hyponatremia.  Adequate BUN and creatinine.  Abdominal physical exam with abdominal distention but nontender to palpation.  She endorses that the pain has improved significantly compared to previous abdominal palpation examination earlier this morning.  CT scan of the abdomen and pelvis shows small bowel dilation and inflammation around the sigmoid colon consistent with diverticulitis.  There are 2 small fluid collection in the pelvis.  No free air.  I personally evaluated the images.  Surgery is consulted by Dr. Francine Graven in this context for evaluation and management of complicated diverticulitis.  PAST MEDICAL HISTORY (PMH):  Past Medical History:  Diagnosis Date   Colon polyps    Diverticulosis    Melanoma ([place]) 1988   lt upper arm, some lymph nodes removed   Mild intermittent asthma    Osteoporosis      PAST SURGICAL HISTORY ([place]):  Past Surgical History:  Procedure Laterality Date   BACK SURGERY     CATARACT EXTRACTION     IR IMAGING GUIDED PORT INSERTION  11/23/2020   TONSILLECTOMY     VAGINAL HYSTERECTOMY  1990     MEDICATIONS:  Prior to Admission medications   Medication Sig Start Date End Date Taking? Authorizing Provider  acetaminophen (TYLENOL) 500 MG tablet Take 500 mg by mouth  every 6 (six) hours as needed.   Yes [provider]  acyclovir (ZOVIRAX) 400 MG tablet Take 1 tablet (400 mg total) by mouth 2 (two) times daily. To prevent shingles 03/29/21  Yes [name], MD  Cholecalciferol (VITAMIN D) 50 MCG (2000 UT) CAPS Take 1 capsule by mouth daily.   Yes [provider]  lidocaine-prilocaine (EMLA) cream lidocaine-prilocaine 2.5 %-2.5 % topical cream  APPLY TOPICALLY TO THE AFFECTED AREA 1 TIME   Yes [provider]  Magnesium Oxide (MAG-OXIDE PO) Take 1 capsule by mouth daily at 12 noon.   Yes [provider]  meloxicam (MOBIC) 15 MG tablet Take 1 tablet by mouth daily. 04/05/21  Yes [provider]  montelukast (SINGULAIR) 10 MG tablet TAKE 1 TABLET(10 MG) BY MOUTH AT BEDTIME. START 2 DAYS BEFORE INFUSION. TAKE IT FOR 4 DAYS 01/24/21  Yes [name], MD  Multiple Vitamins-Minerals (WOMENS MULTIVITAMIN PO) Take by mouth.   Yes [provider]  omeprazole (PRILOSEC) 20 MG capsule Take 20 mg by mouth 2 (two) times daily.   Yes [provider]  potassium chloride SA (KLOR-CON) 20 MEQ tablet TAKE 1 TABLET BY MOUTH TWICE DAILY 03/18/21  Yes [name], MD  traMADol (ULTRAM) 50 MG tablet Take 1 tablet by mouth every 6 (six) hours as needed. 11/08/20  Yes [provider]  allopurinol (ZYLOPRIM) 300 MG tablet TAKE 1 TABLET(300 MG) BY MOUTH TWICE DAILY TO AVOID GOUT FLARE UP WHILE ON CHEMO Patient  not taking: No sig reported 02/18/21   [name], MD  benzonatate (TESSALON PERLES) 100 MG capsule Take 1 capsule (100 mg total) by mouth 3 (three) times daily as needed for cough. Patient not taking: No sig reported 01/15/21 01/15/22  Duffy Bruce, MD  lactulose (CHRONULAC) 10 GM/15ML solution Take 15 mLs (10 g total) by mouth 2 (two) times daily as needed for severe constipation. Patient not taking: No sig reported 04/13/21   Verlon Au, NP  ondansetron (ZOFRAN) 8 MG  tablet Take 1 tablet (8 mg total) by mouth 2 (two) times daily as needed for refractory nausea / vomiting. Start on day 3 after cyclophosphamide chemotherapy. Patient not taking: No sig reported 11/26/20   [name], MD  predniSONE (DELTASONE) 50 MG tablet Take 2 tablets (100 mg total) by mouth daily with breakfast. Patient not taking: Reported on 04/15/2021 12/28/20   [name], MD  prochlorperazine (COMPAZINE) 10 MG tablet Take 1 tablet (10 mg total) by mouth every 6 (six) hours as needed (Nausea or vomiting). Patient not taking: No sig reported 11/26/20   [name], MD  scopolamine (TRANSDERM-SCOP) 1 MG/3DAYS Place 1 patch onto the skin 3 days. Patient not taking: No sig reported    [provider]  traMADol (ULTRAM) 50 MG tablet Take 1 tablet (50 mg total) by mouth every 12 (twelve) hours as needed for moderate pain. Patient not taking: No sig reported 08/25/20   Sable Feil, PA-C  loratadine (CLARITIN) 10 MG tablet Take 1 tablet by mouth as needed.  11/23/20  [provider]     ALLERGIES:  No Known Allergies   SOCIAL HISTORY:  Social History   Socioeconomic History   Marital status: Married    Spouse name: Mikki Santee   Number of children: 2   Years of education: Not on file   Highest education level: Not on file  Occupational History   Not on file  Tobacco Use   Smoking status: Never   Smokeless tobacco: Never  Substance and Sexual Activity   Alcohol use: Never   Drug use: Never   Sexual activity: Yes    Partners: Male  Other Topics Concern   Not on file  Social History Narrative   ** Merged History Encounter **       Lives at home with husband.    Social Determinants of Health   Financial Resource Strain: Not on file  Food Insecurity: Not on file  Transportation Needs: Not on file  Physical Activity: Not on file  Stress: Not on file  Social Connections: Not on file  Intimate Partner Violence: Not on file       FAMILY HISTORY:  Family History  Problem Relation Age of Onset   Breast cancer Neg Hx      REVIEW OF SYSTEMS:  Constitutional: denies weight loss, fever, chills, or sweats  Eyes: denies any other vision changes, history of eye injury  ENT: denies sore throat, hearing problems  Respiratory: denies shortness of breath, wheezing  Cardiovascular: denies chest pain, palpitations  Gastrointestinal: abdominal pain, positive for nausea but no vomiting Genitourinary: denies burning with urination or urinary frequency Musculoskeletal: denies any other joint pains or cramps  Skin: denies any other rashes or skin discolorations  Neurological: denies any other headache, dizziness, weakness  Psychiatric: denies any other depression, anxiety   All other review of systems were negative   VITAL SIGNS:  Temp:  [98.6 F (37 C)] 98.6 F (37  C) (11/14 0525) Pulse Rate:  [103-129] 105 (11/14 1030) Resp:  [18] 18 (11/14 0830) BP: (142-152)/(61-71) 145/61 (11/14 1030) SpO2:  [98 %-100 %] 98 % (11/14 1030) Weight:  [51.7 kg] 51.7 kg (11/14 0531)     Height: 5\' 2"  (157.5 cm) Weight: 51.7 kg BMI (Calculated): 20.85   INTAKE/OUTPUT:  This shift: No intake/output data recorded.  Last 2 shifts: @IOLAST2SHIFTS @   PHYSICAL EXAM:  Constitutional:  -- Normal body habitus  -- Awake, alert, and oriented x3  Eyes:  -- Pupils equally round and reactive to light  -- No scleral icterus  Ear, nose, and throat:  -- No jugular venous distension  Pulmonary:  -- No crackles  -- Equal breath sounds bilaterally -- Breathing non-labored at rest Cardiovascular:  -- S1, S2 present  -- No pericardial rubs Gastrointestinal:  -- Abdomen soft, nontender, distended, no guarding or rebound tenderness -- No abdominal masses appreciated, pulsatile or otherwise  Musculoskeletal and Integumentary:  -- Wounds: None appreciated -- Extremities: B/L UE and LE FROM, hands and feet warm, no edema  Neurologic:  --  Motor function: intact and symmetric -- Sensation: intact and symmetric   Labs:  CBC Latest Ref Rng & Units 04/15/2021 04/08/2021 03/29/2021  WBC 4.0 - 10.5 K/uL 0.3(LL) 8.3 10.1  Hemoglobin 12.0 - 15.0 g/dL 11.3(L) 11.4(L) 11.6(L)  Hematocrit 36.0 - 46.0 % 33.4(L) 34.2(L) 35.2(L)  Platelets 150 - 400 K/uL 134(L) 419(H) 241   CMP Latest Ref Rng & Units 04/15/2021 04/08/2021 03/29/2021  Glucose 70 - 99 mg/dL 96 101(H) 125(H)  BUN 8 - 23 mg/dL 9 19 12   Creatinine 0.44 - 1.00 mg/dL 0.49 0.62 0.73  Sodium 135 - 145 mmol/L 123(L) 131(L) 129(L)  Potassium 3.5 - 5.1 mmol/L 3.7 3.7 3.8  Chloride 98 - 111 mmol/L 88(L) 97(L) 94(L)  CO2 22 - 32 mmol/L 26 27 27   Calcium 8.9 - 10.3 mg/dL 9.0 8.7(L) 9.1  Total Protein 6.5 - 8.1 g/dL 6.2(L) 6.2(L) 5.8(L)  Total Bilirubin 0.3 - 1.2 mg/dL 0.7 0.6 0.1(L)  Alkaline Phos 38 - 126 U/L 88 88 98  AST 15 - 41 U/L 23 22 23   ALT 0 - 44 U/L 14 15 13     Imaging studies:  EXAM: CT ABDOMEN AND PELVIS WITH CONTRAST   TECHNIQUE: Multidetector CT imaging of the abdomen and pelvis was performed using the standard protocol following bolus administration of intravenous contrast.   CONTRAST:  61mL OMNIPAQUE IOHEXOL 300 MG/ML  SOLN   COMPARISON:  01/14/2021   FINDINGS: Lower chest: Scar versus atelectasis identified within both lung bases. No pleural effusion.   Hepatobiliary: No suspicious liver abnormality. Gallbladder appears distended. No gallstones or gallbladder wall thickening. No bile duct dilatation.   Pancreas: Unremarkable. No pancreatic ductal dilatation or surrounding inflammatory changes.   Spleen: Normal in size without focal abnormality.   Adrenals/Urinary Tract: Normal adrenal glands. No kidney mass or hydronephrosis. Urinary bladder is unremarkable.   Stomach/Bowel: Small hiatal hernia.   Colonic diverticulosis identified involving the sigmoid colon. Wall thickening and inflammation of the sigmoid colon is  identified. Inflammatory changes are identified within the pelvis including fat stranding and multiple small interloop fluid collections concerning for perforation with early abscess formation. Within the left pelvic sidewall there is a fluid collection measuring 3.5 x 1.6 cm, image 66/2. Within the posterior pelvis there is a fluid collection measuring 3.4 x 1.4 cm, image 65/2.   Signs bowel obstruction vs ileus identified with multiple dilated loops of small bowel  with air-fluid levels measuring up to 3.7 cm. There is a transition to decreased caliber distal small bowel loops within the right lower quadrant of the abdomen. Terminal ileum appears decompressed. The colon is distended up to the level of the mid descending colon measuring up to 6.7 cm in diameter.   Vascular/Lymphatic: Aortic atherosclerosis. No abdominopelvic adenopathy.   Reproductive: Status post hysterectomy. No adnexal masses.   Other: Fat containing left inguinal hernia.   Musculoskeletal: No acute or significant osseous findings.   IMPRESSION: 1. Examination is positive for acute sigmoid diverticulitis. Two small interloop fluid collections are identified within the pelvis concerning for perforation with early abscess formation. 2. Signs of bowel obstruction and/or ileus identified with transition to decreased caliber distal small bowel loops within the right lower quadrant of the abdomen. 3. Aortic Atherosclerosis (ICD10-I70.0).   Critical Value/emergent results were called by telephone at the time of interpretation on 04/15/2021 at 6:39 am to provider [facility] , who verbally acknowledged these results.     Electronically Signed   By: Kerby Moors M.D.  Assessment/Plan:  75 y.o. 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  Patient with history, physical exam and imaging  consistent with complicated diverticulitis with pelvic abscess.  Physical exam without acute abdomen.  Abdomen is mild to moderately distended but not tender to palpation.  These are very difficult case since the patient is immunocompromised due to chemotherapy.  She has severe neutropenia.  Currently with adequate blood pressures, no lactic acidosis, adequate renal function.  Patient seems to be stable in no acute abdomen.  I discussed with the patient that we will try to treat her diverticulitis with IV antibiotic therapy and hydration.  She was oriented that if she she fails medical management she may need surgical management with partial colectomy and creation of end colostomy.  We will try to avoid this hoping that she will not have significant delay of her chemotherapy.  Also the CT scan was concerning of ileus.  Patient is currently not nauseated.  I discussed with the patient that if she started getting nauseated or having any vomiting she will need a nasogastric tube.  I will continue to follow closely.  She reports she understood the plan and agreed.  [name], MD

## 2021-04-15 NOTE — ED Provider Notes (Signed)
Emergency Medicine Provider Triage Evaluation Note  Mary Washington , a 75 y.o. female  was evaluated in triage.  Pt complains of abdominal distention/bloating and some generalized abdominal pain with constipation but also some passage of liquid stool.  No acute dyspnea although she does have some chronic shortness of breath.  Patient is undergoing chemotherapy with Dr. Rogue Bussing for non-Hodgkin's lymphoma.  No fever or chills.  No vomiting.  Review of Systems  Positive: Abdominal distention/bloating, mild generalized abdominal pain, constipation but with some liquid stool. Negative: Fever/chills, vomiting, chest pain, acute dyspnea.  Physical Exam  BP (!) 146/64 (BP Location: Left Arm)   Pulse (!) 129   Temp 98.6 F (37 C) (Oral)   Resp 18   Ht 1.575 m (5\' 2" )   Wt 51.7 kg   SpO2 98%   BMI 20.85 kg/m  Gen:   Awake, alert.  No acute distress. Resp:  Some slightly increased respiratory effort at baseline which she says is normal for her, confirmed by husband. MSK:   Moves extremities without difficulty  Other:  LE some degree of abdominal bloating with generalized tenderness to palpation throughout but with no peritonitis.  Medical Decision Making  Medically screening exam initiated at 5:41 AM.  Appropriate orders placed.  Mary Washington was informed that the remainder of the evaluation will be completed by another provider, this initial triage assessment does not replace that evaluation, and the importance of remaining in the ED until their evaluation is complete.  Ordered: "Possible sepsis" work-up given her immunocompromise state and tachycardia in triage.  Lab work pending, CT of the abdomen and pelvis ordered as well to be obtained once renal function is verified.   Hinda Kehr, MD 04/15/21 [phone]

## 2021-04-16 ENCOUNTER — Inpatient Hospital Stay: Payer: Medicare Other

## 2021-04-16 DIAGNOSIS — K5792 Diverticulitis of intestine, part unspecified, without perforation or abscess without bleeding: Secondary | ICD-10-CM | POA: Diagnosis not present

## 2021-04-16 LAB — BASIC METABOLIC PANEL
Anion gap: 11 (ref 5–15)
BUN: 10 mg/dL (ref 8–23)
CO2: 24 mmol/L (ref 22–32)
Calcium: 8.4 mg/dL — ABNORMAL LOW (ref 8.9–10.3)
Chloride: 95 mmol/L — ABNORMAL LOW (ref 98–111)
Creatinine, Ser: 0.52 mg/dL (ref 0.44–1.00)
GFR, Estimated: >60 mL/min (ref >60)
Glucose, Bld: 75 mg/dL (ref 70–99)
Potassium: 3 mmol/L — ABNORMAL LOW (ref 3.5–5.1)
Sodium: 130 mmol/L — ABNORMAL LOW (ref 135–145)

## 2021-04-16 LAB — CBC
HCT: 27.8 % — ABNORMAL LOW (ref 36.0–46.0)
Hemoglobin: 9.3 g/dL — ABNORMAL LOW (ref 12.0–15.0)
MCH: 29.6 pg (ref 26.0–34.0)
MCHC: 33.5 g/dL (ref 30.0–36.0)
MCV: 88.5 fL (ref 80.0–100.0)
Platelets: 166 10*3/uL (ref 150–400)
RBC: 3.14 MIL/uL — ABNORMAL LOW (ref 3.87–5.11)
RDW: 15.9 % — ABNORMAL HIGH (ref 11.5–15.5)
WBC: 0.5 10*3/uL — CL (ref 4.0–10.5)
nRBC: 0 % (ref 0.0–0.2)

## 2021-04-16 LAB — URINE CULTURE: Culture: NO GROWTH

## 2021-04-16 LAB — MAGNESIUM: Magnesium: 1.7 mg/dL (ref 1.7–2.4)

## 2021-04-16 LAB — PHOSPHORUS: Phosphorus: 3.3 mg/dL (ref 2.5–4.6)

## 2021-04-16 MED ORDER — PANTOPRAZOLE SODIUM 40 MG IV SOLR
40.0000 mg | Freq: Every day | INTRAVENOUS | Status: DC
  Administered 2021-04-16 – 2021-04-30 (×15): 40 mg via INTRAVENOUS
  Filled 2021-04-16 (×15): qty 40

## 2021-04-16 MED ORDER — POTASSIUM CHLORIDE 10 MEQ/100ML IV SOLN
10.0000 meq | INTRAVENOUS | Status: AC
  Administered 2021-04-16 (×3): 10 meq via INTRAVENOUS
  Filled 2021-04-16 (×3): qty 100

## 2021-04-16 MED ORDER — CHLORHEXIDINE GLUCONATE CLOTH 2 % EX PADS
6.0000 | MEDICATED_PAD | Freq: Every day | CUTANEOUS | Status: DC
  Administered 2021-04-17 – 2021-05-03 (×17): 6 via TOPICAL

## 2021-04-16 MED ORDER — MAGNESIUM SULFATE 2 GM/50ML IV SOLN
2.0000 g | Freq: Once | INTRAVENOUS | Status: AC
  Administered 2021-04-16: 2 g via INTRAVENOUS
  Filled 2021-04-16: qty 50

## 2021-04-16 MED ORDER — DIATRIZOATE MEGLUMINE & SODIUM 66-10 % PO SOLN
90.0000 mL | Freq: Once | ORAL | Status: AC
  Administered 2021-04-16: 90 mL via NASOGASTRIC

## 2021-04-16 MED ORDER — ENOXAPARIN SODIUM 40 MG/0.4ML IJ SOSY
40.0000 mg | PREFILLED_SYRINGE | INTRAMUSCULAR | Status: DC
  Administered 2021-04-16 – 2021-05-02 (×16): 40 mg via SUBCUTANEOUS
  Filled 2021-04-16 (×16): qty 0.4

## 2021-04-16 NOTE — Progress Notes (Signed)
[facility] [place] NAME: Mary Washington    MR#:  [id number]  DATE OF BIRTH:  Apr 24, 1946  SUBJECTIVE:   came in with abdominal distention and bloating. Had vomiting. NG tube placed last night. Has bilious output. Denies passing gas from bottom. No family at bedside. No fever. Denies shortness of breath. REVIEW OF SYSTEMS:   Review of Systems  Constitutional:  Negative for chills, fever and weight loss.  HENT:  Negative for ear discharge, ear pain and nosebleeds.   Eyes:  Negative for blurred vision, pain and discharge.  Respiratory:  Negative for sputum production, shortness of breath, wheezing and stridor.   Cardiovascular:  Negative for chest pain, palpitations, orthopnea and PND.  Gastrointestinal:  Positive for abdominal pain and nausea. Negative for diarrhea and vomiting.  Genitourinary:  Negative for frequency and urgency.  Musculoskeletal:  Negative for back pain and joint pain.  Neurological:  Positive for weakness. Negative for sensory change, speech change and focal weakness.  Psychiatric/Behavioral:  Negative for depression and hallucinations. The patient is not nervous/anxious.   Tolerating Diet:NPO Tolerating PT: pending  DRUG ALLERGIES:  No Known Allergies  VITALS:  Blood pressure 132/67, pulse (!) 108, temperature 98 F (36.7 C), temperature source Oral, resp. rate 16, height 5\' 2"  (1.575 m), weight 51.7 kg, SpO2 99 %.  PHYSICAL EXAMINATION:   Physical Exam  GENERAL:  75 y.o.-year-old patient lying in the bed with no acute distress.  HEENT: Head atraumatic, normocephalic. Oropharynx and nasopharynx clear. NG+ LUNGS: Normal breath sounds bilaterally, no wheezing, rales, rhonchi. No use of accessory muscles of respiration.  CARDIOVASCULAR: S1, S2 normal. No murmurs, rubs, or gallops.  ABDOMEN: Soft, nontender, +distended.  EXTREMITIES: No cyanosis, clubbing or edema b/l.    NEUROLOGIC: nonfocal PSYCHIATRIC:  patient is  alert and oriented x 3.  SKIN: No obvious rash, lesion, or ulcer.   LABORATORY PANEL:  CBC Recent Labs  Lab 04/16/21 0625  WBC 0.5*  HGB 9.3*  HCT 27.8*  PLT 166    Chemistries  Recent Labs  Lab 04/15/21 0626 04/16/21 0625  NA 123* 130*  K 3.7 3.0*  CL 88* 95*  CO2 26 24  GLUCOSE 96 75  BUN 9 10  CREATININE 0.49 0.52  CALCIUM 9.0 8.4*  MG  --  1.7  AST 23  --   ALT 14  --   ALKPHOS 88  --   BILITOT 0.7  --    Cardiac Enzymes No results for input(s): TROPONINI in the last 168 hours. RADIOLOGY:  DG Abd 1 View  Result Date: 04/15/2021 CLINICAL DATA:  NG tube placement EXAM: ABDOMEN - 1 VIEW COMPARISON:  None. FINDINGS: NG tube tip is in the stomach. Dilated small bowel loops compatible with small bowel obstruction. IMPRESSION: NG tube tip in the stomach. Electronically Signed   By: Rolm Baptise M.D.   On: 04/15/2021 23:22   CT ABDOMEN PELVIS W CONTRAST  Result Date: 04/15/2021 CLINICAL DATA:  Abdominal distension and bloating. EXAM: CT ABDOMEN AND PELVIS WITH CONTRAST TECHNIQUE: Multidetector CT imaging of the abdomen and pelvis was performed using the standard protocol following bolus administration of intravenous contrast. CONTRAST:  8mL OMNIPAQUE IOHEXOL 300 MG/ML  SOLN COMPARISON:  01/14/2021 FINDINGS: Lower chest: Scar versus atelectasis identified within both lung bases. No pleural effusion. Hepatobiliary: No suspicious liver abnormality. Gallbladder appears distended. No gallstones or gallbladder wall thickening. No bile duct dilatation. Pancreas: Unremarkable. No pancreatic ductal dilatation or surrounding inflammatory  changes. Spleen: Normal in size without focal abnormality. Adrenals/Urinary Tract: Normal adrenal glands. No kidney mass or hydronephrosis. Urinary bladder is unremarkable. Stomach/Bowel: Small hiatal hernia. Colonic diverticulosis identified involving the sigmoid colon. Wall thickening and inflammation of the sigmoid colon is identified. Inflammatory  changes are identified within the pelvis including fat stranding and multiple small interloop fluid collections concerning for perforation with early abscess formation. Within the left pelvic sidewall there is a fluid collection measuring 3.5 x 1.6 cm, image 66/2. Within the posterior pelvis there is a fluid collection measuring 3.4 x 1.4 cm, image 65/2. Signs bowel obstruction vs ileus identified with multiple dilated loops of small bowel with air-fluid levels measuring up to 3.7 cm. There is a transition to decreased caliber distal small bowel loops within the right lower quadrant of the abdomen. Terminal ileum appears decompressed. The colon is distended up to the level of the mid descending colon measuring up to 6.7 cm in diameter. Vascular/Lymphatic: Aortic atherosclerosis. No abdominopelvic adenopathy. Reproductive: Status post hysterectomy. No adnexal masses. Other: Fat containing left inguinal hernia. Musculoskeletal: No acute or significant osseous findings. IMPRESSION: 1. Examination is positive for acute sigmoid diverticulitis. Two small interloop fluid collections are identified within the pelvis concerning for perforation with early abscess formation. 2. Signs of bowel obstruction and/or ileus identified with transition to decreased caliber distal small bowel loops within the right lower quadrant of the abdomen. 3. Aortic Atherosclerosis (ICD10-I70.0). Critical Value/emergent results were called by telephone at the time of interpretation on 04/15/2021 at 6:39 am to provider [facility] , who verbally acknowledged these results. Electronically Signed   By: Kerby Moors M.D.   On: 04/15/2021 06:42   ASSESSMENT AND PLAN:  Mary Washington is a 75 y.o. 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 Patient states that she gets  chemotherapy every 3 weeks and her last treatment was 1 week ago on 11/07.  She states that following the treatment she developed constipation and had to take several days of MiraLAX without any significant improvement.  Acute diverticulitis with pelvic abscess small bowel obstruction/ileus -- IV fluids -- continue IV Zosyn -- NPO -- NG tube per surgical recommendation -- IV antiemetics -- appreciate Dr. Windell Moment surgical input --11/15-- patient to get gastrographin and study today  Hyponatremia -- likely due to volume depletion for poor PO intake -- continue IV normal saline -- came in with sodium of 123--- IV fluids-- 130  Hypokalemia, Low magnesium -- likely due to G.I. loss -- pharmacy to replete IV  Pancytopenia suspected due to chemotherapy -- patient was seen by Dr. Rogue Bussing -- she recently completed chemo last week and received a dose of G CSF -- continue to monitor counts  history of diffuse large B-cell lymphoma -- undergoing chemotherapy with Dr. Rogue Bussing   Procedures: Family communication :none Consults : oncology, surgery CODE STATUS: full code DVT Prophylaxis : enoxaparin Level of care: Telemetry Medical Status is: Inpatient  Remains inpatient appropriate because: management for acute diverticulitis with pelvic abscess        TOTAL TIME TAKING CARE OF THIS PATIENT: 35 minutes.  >50% time spent on counselling and coordination of care  Note: This dictation was prepared with Dragon dictation along with smaller phrase technology. Any transcriptional errors that result from this process are unintentional.  Fritzi Mandes M.D    [facility]   CC:  Primary care physician; [name], MD Patient ID: [name], female   DOB: [birth year], 75 y.o.   MRN: [id number]

## 2021-04-16 NOTE — Progress Notes (Signed)
Initial Nutrition Assessment  DOCUMENTATION CODES:   Not applicable  INTERVENTION:   RD will monitor for diet advancement vs the need for nutrition support  Pt at high refeed risk  NUTRITION DIAGNOSIS:   Inadequate oral intake related to acute illness as evidenced by NPO status.  GOAL:   Patient will meet greater than or equal to 90% of their needs  MONITOR:   Diet advancement, Labs, Weight trends, Skin, I & O's  REASON FOR ASSESSMENT:   Malnutrition Screening Tool    ASSESSMENT:   75 y.o. 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  Met with pt in room today. Pt reports fair appetite and oral intake at baseline. Pt reports that most of the time her oral intake is good but that she will have decreased oral intake for a day or so after her chemo treatments. Pt reports that she does not drink any supplements at home as she eats well most of the time. Pt reports poor appetite and oral intake since Friday r/t abdominal pain and nausea. Pt currently NPO. NGT in place with 1378m output. Pt reports her abdominal pain is about the same today; pt continues to have distension. Pt reports that her nausea improved initially after NGT placement but reports that she is feeling more nauseas and burping after gastrografin was put down her tube for study today. RD discussed with pt the importance of adequate nutrition needed to preserve lean muscle. Pt reports that she is willing to drink chocolate or vanilla Ensure once her diet is advanced. Would recommend consideration of early TPN if plan is for surgery at any point. Per chart, pt is down 24lbs(17%) since March; this is significant. Pt's weight appears stable for the past two months. RD will monitor for diet advancement vs the need for nutrition support. Pt is at high refeed risk.  Pt is at high risk for developing malnutrition.   Medications reviewed and include: lovenox, NaCl _0 /hr, zosyn, KCl  Labs reviewed: Na 130(L), K 3.0(L), P 3.3 wnl, Mg 1.7 wnl Wbc 0.5(L), Hgb 9.3(L), Hct 27.8(L)  NUTRITION - FOCUSED PHYSICAL EXAM:  Flowsheet Row Most Recent Value  Orbital Region No depletion  Upper Arm Region No depletion  Thoracic and Lumbar Region No depletion  Buccal Region No depletion  Temple Region Mild depletion  Clavicle Bone Region Moderate depletion  Clavicle and Acromion Bone Region Moderate depletion  Scapular Bone Region Mild depletion  Dorsal Hand Moderate depletion  Patellar Region Moderate depletion  Anterior Thigh Region Moderate depletion  Posterior Calf Region Moderate depletion  Edema (RD Assessment) None  Hair Reviewed  Eyes Reviewed  Mouth Reviewed  Skin Reviewed  Nails Reviewed   Diet Order:   Diet Order             Diet NPO time specified  Diet effective now                  EDUCATION NEEDS:   Education needs have been addressed  Skin:  Skin Assessment: Reviewed RN Assessment  Last BM:  11/14  Height:   Ht Readings from Last 1 Encounters:  04/15/21 5' 2" (1.575 m)    Weight:   Wt Readings from Last 1 Encounters:  04/16/21 54.6 kg    Ideal Body Weight:  50 kg  BMI:  Body mass index is 22.02 kg/m.  Estimated Nutritional  Needs:   Kcal:  1500-1700kcal/day  Protein:  75-85g/day  Fluid:  1.3-1.5L/day  Koleen Distance MS, RD, LDN Please refer to [facility] for RD and/or RD on-call/weekend/after hours pager

## 2021-04-16 NOTE — Progress Notes (Signed)
Patient ID: Mary Washington, female   DOB: 06/10/1945, 75 y.o.   MRN: [id number]     [facility] Day(s): 1.   Interval History: Patient seen and examined this morning.  He endorses that last night she got nauseous and NGT was placed.  NGT in place.  She does not recall passing gas overnight.  She denies any new or worsening abdominal pain.  Vital signs in last 24 hours: [min-max] current  Temp:  [98 F (36.7 C)-99.8 F (37.7 C)] 98 F (36.7 C) (11/15 0813) Pulse Rate:  [103-119] 108 (11/15 0814) Resp:  [16-18] 16 (11/15 0432) BP: (132-152)/(53-71) 132/67 (11/15 0813) SpO2:  [97 %-100 %] 99 % (11/15 0813)     Height: 5\' 2"  (157.5 cm) Weight: 51.7 kg BMI (Calculated): 20.85   Physical Exam:  Constitutional: alert, cooperative and no distress  Respiratory: breathing non-labored at rest  Cardiovascular: regular rate and sinus rhythm  Gastrointestinal: soft, non-tender, and distended  Labs:  CBC Latest Ref Rng & Units 04/16/2021 04/15/2021 04/08/2021  WBC 4.0 - 10.5 K/uL 0.5(LL) 0.3(LL) 8.3  Hemoglobin 12.0 - 15.0 g/dL 9.3(L) 11.3(L) 11.4(L)  Hematocrit 36.0 - 46.0 % 27.8(L) 33.4(L) 34.2(L)  Platelets 150 - 400 K/uL 166 134(L) 419(H)   CMP Latest Ref Rng & Units 04/16/2021 04/15/2021 04/08/2021  Glucose 70 - 99 mg/dL 75 96 101(H)  BUN 8 - 23 mg/dL 10 9 19   Creatinine 0.44 - 1.00 mg/dL 0.52 0.49 0.62  Sodium 135 - 145 mmol/L 130(L) 123(L) 131(L)  Potassium 3.5 - 5.1 mmol/L 3.0(L) 3.7 3.7  Chloride 98 - 111 mmol/L 95(L) 88(L) 97(L)  CO2 22 - 32 mmol/L 24 26 27   Calcium 8.9 - 10.3 mg/dL 8.4(L) 9.0 8.7(L)  Total Protein 6.5 - 8.1 g/dL - 6.2(L) 6.2(L)  Total Bilirubin 0.3 - 1.2 mg/dL - 0.7 0.6  Alkaline Phos 38 - 126 U/L - 88 88  AST 15 - 41 U/L - 23 22  ALT 0 - 44 U/L - 14 15    Imaging studies: I personally evaluated abdominal x-ray after NGT was placed.  Still with persistent small bowel dilation.   Assessment/Plan:  75 y.o. female with complicated  diverticulitis with pelvic abscess, complicated by pertinent comorbidities including large B-cell lymphoma currently on chemotherapy, neutropenia.  Patient got nauseous overnight and NGT was placed.  There was a 1.3 L of output.  Today on physical exam the abdomen is still distended but softer than yesterday.  I will order Gastrografin challenge for further evaluation.  From the diverticulitis standpoint, patient without fever.  Heart rate continue to be on the low 100s.  Abdominal physical exam slightly improved with less distention.  There has been no tenderness on the palpation.  We will continue to try medical management with IV antibiotic therapy.  Patient is not a good candidate for surgery at this moment due to severe neutropenia.  We will appreciate hospitalist and oncology management for her neutropenia in case she needs surgery.  I encouraged the patient to ambulate.  I will follow-up closely with physical exam and imaging.   [name], MD

## 2021-04-16 NOTE — Progress Notes (Signed)
Patient ID: Mary Washington, female   DOB: 12/11/1945, 75 y.o.   MRN: [id number] SURGERY FOLLOW UP NOTE  Patient was reevaluated this afternoon.  She was found on with the tube unclamped.  She endorses that she has been having progressive bloating and nausea.  Patient has not vomited.  Denies passing any gas.  Denies any abdominal pain.  Vitals:   04/16/21 0814 04/16/21 1559  BP:  (!) 152/74  Pulse: (!) 108 (!) 109  Resp:    Temp:  98.1 F (36.7 C)  SpO2:  98%   General: Alert, oriented x3, no distress. Abdomen: Soft and depressible, distended, tympanic, nontender  A/P:   Patient with recurrent distention and bloating.  I put her back to suction draining 1.5 L of bile.  Abdominal x-ray showed persistent small bowel dilation but contrast reaching the ascending colon.  This is a good sign.  We will plan to follow-up with physical exam and serial x-ray images.  This follow-up encounter was more than 30-minute most of the time counseling the patient, draining her stomach with NGT, evaluation of images and coordinating plan of care.  Herbert Pun, MD, FACS

## 2021-04-16 NOTE — Progress Notes (Signed)
Mary Washington   DOB:1946-03-01   UE#:[id number]    Subjective: Overnight patient had nausea with vomiting.  S/p NG tube placement.  She is currently comfortable.  No fever no chills.  Objective:  Vitals:   04/16/21 0814 04/16/21 1559  BP:  (!) 152/74  Pulse: (!) 108 (!) 109  Resp:    Temp:  98.1 F (36.7 C)  SpO2:  98%     Intake/Output Summary (Last 24 hours) at 04/16/2021 2053 Last data filed at 04/16/2021 1700 Gross per 24 hour  Intake 100 ml  Output 3000 ml  Net -2900 ml   Patient resting comfortably in the bed.  Has NG tube in place. Physical Exam Vitals and nursing note reviewed.  HENT:     Head: Normocephalic and atraumatic.     Mouth/Throat:     Pharynx: Oropharynx is clear.  Eyes:     Extraocular Movements: Extraocular movements intact.     Pupils: Pupils are equal, round, and reactive to light.  Cardiovascular:     Rate and Rhythm: Normal rate and regular rhythm.  Pulmonary:     Comments: Decreased breath sounds bilaterally.  Abdominal:     Palpations: Abdomen is soft.  Musculoskeletal:        General: Normal range of motion.     Cervical back: Normal range of motion.  Skin:    General: Skin is warm.  Neurological:     General: No focal deficit present.     Mental Status: She is alert and oriented to person, place, and time.  Psychiatric:        Behavior: Behavior normal.        Judgment: Judgment normal.     Labs:  Lab Results  Component Value Date   WBC 0.5 (LL) 04/16/2021   HGB 9.3 (L) 04/16/2021   HCT 27.8 (L) 04/16/2021   MCV 88.5 04/16/2021   PLT 166 04/16/2021   NEUTROABS 0.1 (LL) 04/15/2021    Lab Results  Component Value Date   NA 130 (L) 04/16/2021   K 3.0 (L) 04/16/2021   CL 95 (L) 04/16/2021   CO2 24 04/16/2021    Studies:  DG Abd 1 View  Result Date: 04/15/2021 CLINICAL DATA:  NG tube placement EXAM: ABDOMEN - 1 VIEW COMPARISON:  None. FINDINGS: NG tube tip is in the stomach. Dilated small bowel loops compatible with small  bowel obstruction. IMPRESSION: NG tube tip in the stomach. Electronically Signed   By: Rolm Baptise M.D.   On: 04/15/2021 23:22   CT ABDOMEN PELVIS W CONTRAST  Result Date: 04/15/2021 CLINICAL DATA:  Abdominal distension and bloating. EXAM: CT ABDOMEN AND PELVIS WITH CONTRAST TECHNIQUE: Multidetector CT imaging of the abdomen and pelvis was performed using the standard protocol following bolus administration of intravenous contrast. CONTRAST:  73mL OMNIPAQUE IOHEXOL 300 MG/ML  SOLN COMPARISON:  01/14/2021 FINDINGS: Lower chest: Scar versus atelectasis identified within both lung bases. No pleural effusion. Hepatobiliary: No suspicious liver abnormality. Gallbladder appears distended. No gallstones or gallbladder wall thickening. No bile duct dilatation. Pancreas: Unremarkable. No pancreatic ductal dilatation or surrounding inflammatory changes. Spleen: Normal in size without focal abnormality. Adrenals/Urinary Tract: Normal adrenal glands. No kidney mass or hydronephrosis. Urinary bladder is unremarkable. Stomach/Bowel: Small hiatal hernia. Colonic diverticulosis identified involving the sigmoid colon. Wall thickening and inflammation of the sigmoid colon is identified. Inflammatory changes are identified within the pelvis including fat stranding and multiple small interloop fluid collections concerning for perforation with early abscess formation. Within the left  pelvic sidewall there is a fluid collection measuring 3.5 x 1.6 cm, image 66/2. Within the posterior pelvis there is a fluid collection measuring 3.4 x 1.4 cm, image 65/2. Signs bowel obstruction vs ileus identified with multiple dilated loops of small bowel with air-fluid levels measuring up to 3.7 cm. There is a transition to decreased caliber distal small bowel loops within the right lower quadrant of the abdomen. Terminal ileum appears decompressed. The colon is distended up to the level of the mid descending colon measuring up to 6.7 cm in  diameter. Vascular/Lymphatic: Aortic atherosclerosis. No abdominopelvic adenopathy. Reproductive: Status post hysterectomy. No adnexal masses. Other: Fat containing left inguinal hernia. Musculoskeletal: No acute or significant osseous findings. IMPRESSION: 1. Examination is positive for acute sigmoid diverticulitis. Two small interloop fluid collections are identified within the pelvis concerning for perforation with early abscess formation. 2. Signs of bowel obstruction and/or ileus identified with transition to decreased caliber distal small bowel loops within the right lower quadrant of the abdomen. 3. Aortic Atherosclerosis (ICD10-I70.0). Critical Value/emergent results were called by telephone at the time of interpretation on 04/15/2021 at 6:39 am to provider [facility] , who verbally acknowledged these results. Electronically Signed   By: Kerby Moors M.D.   On: 04/15/2021 06:42   DG Abd Portable 1V-Small Bowel Obstruction Protocol-initial, 8 hr delay  Result Date: 04/16/2021 CLINICAL DATA:  Follow up small bowel obstruction EXAM: PORTABLE ABDOMEN - 1 VIEW COMPARISON:  04/15/2021 FINDINGS: Gastric catheter is again noted within the stomach. Administered contrast is seen within dilated loops of small bowel similar to that seen on the prior exam. Multiple ingested tablets are noted within the proximal colon. Contrast material has also passed into the proximal transverse colon. These changes are consistent with partial small bowel obstruction. The overall appearance is similar to that noted on prior CT examination and prior plain film. No free air is seen. Changes of prior vertebral augmentation are noted. IMPRESSION: Changes consistent with persistent partial small bowel obstruction. Contrast material remains in multiple dilated loops of small bowel but has passed into the proximal colon. Electronically Signed   By: Inez Catalina M.D.   On: 04/16/2021 19:21    Large cell lymphoma of lymph nodes of  neck [facility]) # 75 year old female patient with a history of diffuse B-cell lymphoma-currently s/p R-CHOP chemotherapy is currently admitted to hospital for sigmoid diverticulitis/suspected perforation/and severe neutropenia.  # Diffuse large B-cell lymphoma-stage III-currently s/p cycle #5 of R-CHOP chemotherapy; day # 8 today.  Post 3 cycles patient had significant clinical response from chemotherapy.  We will hold further chemotherapy while awaiting improvement of current acute clinically issues [see below].  #Severe neutropenia-secondary chemotherapy-s/p long-acting growth factor week ago; I expect the counts to recover in the next few days.  # Acute sigmoid diverticulitis/with suspected perforation: On IV antibiotics s/p evaluation with surgery; currently monitored closely/conservative measures.  Appreciate surgical evaluation.  The above plan of care was discussed with the patient's husband; and also with Dr.Cintron.    [name], MD 04/16/2021  8:53 PM

## 2021-04-17 ENCOUNTER — Inpatient Hospital Stay: Payer: Medicare Other

## 2021-04-17 DIAGNOSIS — K5792 Diverticulitis of intestine, part unspecified, without perforation or abscess without bleeding: Secondary | ICD-10-CM | POA: Diagnosis not present

## 2021-04-17 LAB — CBC WITH DIFFERENTIAL/PLATELET
Abs Immature Granulocytes: 0.19 10*3/uL — ABNORMAL HIGH (ref 0.00–0.07)
Basophils Absolute: 0 10*3/uL (ref 0.0–0.1)
Basophils Relative: 1 %
Eosinophils Absolute: 0 10*3/uL (ref 0.0–0.5)
Eosinophils Relative: 1 %
HCT: 28.1 % — ABNORMAL LOW (ref 36.0–46.0)
Hemoglobin: 9.3 g/dL — ABNORMAL LOW (ref 12.0–15.0)
Immature Granulocytes: 13 %
Lymphocytes Relative: 10 %
Lymphs Abs: 0.1 10*3/uL — ABNORMAL LOW (ref 0.7–4.0)
MCH: 29.4 pg (ref 26.0–34.0)
MCHC: 33.1 g/dL (ref 30.0–36.0)
MCV: 88.9 fL (ref 80.0–100.0)
Monocytes Absolute: 0.3 10*3/uL (ref 0.1–1.0)
Monocytes Relative: 19 %
Neutro Abs: 0.8 10*3/uL — ABNORMAL LOW (ref 1.7–7.7)
Neutrophils Relative %: 56 %
Platelets: 212 10*3/uL (ref 150–400)
RBC: 3.16 MIL/uL — ABNORMAL LOW (ref 3.87–5.11)
RDW: 15.9 % — ABNORMAL HIGH (ref 11.5–15.5)
Smear Review: NORMAL
WBC: 1.5 10*3/uL — ABNORMAL LOW (ref 4.0–10.5)
nRBC: 0 % (ref 0.0–0.2)

## 2021-04-17 LAB — BASIC METABOLIC PANEL
Anion gap: 12 (ref 5–15)
BUN: 8 mg/dL (ref 8–23)
CO2: 20 mmol/L — ABNORMAL LOW (ref 22–32)
Calcium: 8.4 mg/dL — ABNORMAL LOW (ref 8.9–10.3)
Chloride: 99 mmol/L (ref 98–111)
Creatinine, Ser: 0.52 mg/dL (ref 0.44–1.00)
GFR, Estimated: >60 mL/min (ref >60)
Glucose, Bld: 85 mg/dL (ref 70–99)
Potassium: 2.7 mmol/L — CL (ref 3.5–5.1)
Sodium: 131 mmol/L — ABNORMAL LOW (ref 135–145)

## 2021-04-17 LAB — PHOSPHORUS: Phosphorus: 2.6 mg/dL (ref 2.5–4.6)

## 2021-04-17 LAB — MAGNESIUM: Magnesium: 1.8 mg/dL (ref 1.7–2.4)

## 2021-04-17 MED ORDER — POTASSIUM CHLORIDE IN NACL 20-0.9 MEQ/L-% IV SOLN
INTRAVENOUS | Status: DC
  Filled 2021-04-17 (×3): qty 1000

## 2021-04-17 MED ORDER — POTASSIUM CHLORIDE 10 MEQ/100ML IV SOLN
10.0000 meq | INTRAVENOUS | Status: AC
  Administered 2021-04-17: 10 meq via INTRAVENOUS

## 2021-04-17 MED ORDER — MENTHOL 3 MG MT LOZG
1.0000 | LOZENGE | OROMUCOSAL | Status: DC | PRN
  Administered 2021-04-17: 21:00:00 3 mg via ORAL
  Filled 2021-04-17: qty 9

## 2021-04-17 MED ORDER — POTASSIUM CHLORIDE 10 MEQ/100ML IV SOLN
10.0000 meq | INTRAVENOUS | Status: AC
  Administered 2021-04-17 (×3): 10 meq via INTRAVENOUS
  Filled 2021-04-17 (×4): qty 100

## 2021-04-17 MED ORDER — DEXTROSE 5 % IV SOLN
5.0000 mg/kg | Freq: Two times a day (BID) | INTRAVENOUS | Status: DC
  Administered 2021-04-17 – 2021-04-30 (×27): 265 mg via INTRAVENOUS
  Filled 2021-04-17 (×30): qty 5.3

## 2021-04-17 NOTE — Progress Notes (Signed)
[facility] NAME: Mary Washington    MR#:  [id number]  DATE OF BIRTH:  1946-01-08  SUBJECTIVE:   patient had a bowel movement this morning. She feels overall better. NG tube has been clamped by surgery REVIEW OF SYSTEMS:   Review of Systems  Constitutional:  Negative for chills, fever and weight loss.  HENT:  Negative for ear discharge, ear pain and nosebleeds.   Eyes:  Negative for blurred vision, pain and discharge.  Respiratory:  Negative for sputum production, shortness of breath, wheezing and stridor.   Cardiovascular:  Negative for chest pain, palpitations, orthopnea and PND.  Gastrointestinal:  Positive for abdominal pain and nausea. Negative for diarrhea and vomiting.  Genitourinary:  Negative for frequency and urgency.  Musculoskeletal:  Negative for back pain and joint pain.  Neurological:  Positive for weakness. Negative for sensory change, speech change and focal weakness.  Psychiatric/Behavioral:  Negative for depression and hallucinations. The patient is not nervous/anxious.   Tolerating Diet:NPO Tolerating PT: pending  DRUG ALLERGIES:  No Known Allergies  VITALS:  Blood pressure 137/61, pulse 84, temperature 98.7 F (37.1 C), temperature source Oral, resp. rate 17, height 5\' 2"  (1.575 m), weight 52.5 kg, SpO2 99 %.  PHYSICAL EXAMINATION:   Physical Exam  GENERAL:  75 y.o.-year-old patient lying in the bed with no acute distress.  HEENT: Head atraumatic, normocephalic. Oropharynx and nasopharynx clear. NG+ clamped LUNGS: Normal breath sounds bilaterally, no wheezing, rales, rhonchi. No use of accessory muscles of respiration.  CARDIOVASCULAR: S1, S2 normal. No murmurs, rubs, or gallops.  ABDOMEN: Soft, nontender, +distended.  EXTREMITIES: No cyanosis, clubbing or edema b/l.    NEUROLOGIC: nonfocal PSYCHIATRIC:  patient is alert and oriented x 3.  SKIN: No obvious rash, lesion, or ulcer.   LABORATORY PANEL:   CBC Recent Labs  Lab 04/17/21 0639  WBC 1.5*  HGB 9.3*  HCT 28.1*  PLT 212     Chemistries  Recent Labs  Lab 04/15/21 0626 04/16/21 0625 04/17/21 0639  NA 123*   < > 131*  K 3.7   < > 2.7*  CL 88*   < > 99  CO2 26   < > 20*  GLUCOSE 96   < > 85  BUN 9   < > 8  CREATININE 0.49   < > 0.52  CALCIUM 9.0   < > 8.4*  MG  --    < > 1.8  AST 23  --   --   ALT 14  --   --   ALKPHOS 88  --   --   BILITOT 0.7  --   --    < > = values in this interval not displayed.    Cardiac Enzymes No results for input(s): TROPONINI in the last 168 hours. RADIOLOGY:  DG Abd 1 View  Result Date: 04/15/2021 CLINICAL DATA:  NG tube placement EXAM: ABDOMEN - 1 VIEW COMPARISON:  None. FINDINGS: NG tube tip is in the stomach. Dilated small bowel loops compatible with small bowel obstruction. IMPRESSION: NG tube tip in the stomach. Electronically Signed   By: Rolm Baptise M.D.   On: 04/15/2021 23:22   DG Abd 2 Views  Result Date: 04/17/2021 CLINICAL DATA:  Ileus EXAM: ABDOMEN - 2 VIEW COMPARISON:  04/16/2021 FINDINGS: NG tube in the stomach. Side hole in the fundus with the tip in the body the stomach. Interval improvement in small bowel dilatation. Oral contrast has  entered the right colon and progressed through the small-bowel compared with yesterday. Kyphoplasty cement in L1 vertebral body. IMPRESSION: NG tube in the stomach. Improvement in small bowel dilatation. Oral contrast is now in the right colon. Electronically Signed   By: Franchot Gallo M.D.   On: 04/17/2021 14:16   DG Abd Portable 1V-Small Bowel Obstruction Protocol-initial, 8 hr delay  Result Date: 04/16/2021 CLINICAL DATA:  Follow up small bowel obstruction EXAM: PORTABLE ABDOMEN - 1 VIEW COMPARISON:  04/15/2021 FINDINGS: Gastric catheter is again noted within the stomach. Administered contrast is seen within dilated loops of small bowel similar to that seen on the prior exam. Multiple ingested tablets are noted within the proximal  colon. Contrast material has also passed into the proximal transverse colon. These changes are consistent with partial small bowel obstruction. The overall appearance is similar to that noted on prior CT examination and prior plain film. No free air is seen. Changes of prior vertebral augmentation are noted. IMPRESSION: Changes consistent with persistent partial small bowel obstruction. Contrast material remains in multiple dilated loops of small bowel but has passed into the proximal colon. Electronically Signed   By: Inez Catalina M.D.   On: 04/16/2021 19:21   ASSESSMENT AND PLAN:  Mary Washington is a 75 y.o. 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 Patient states that she gets chemotherapy every 3 weeks and her last treatment was 1 week ago on 11/07.  She states that following the treatment she developed constipation and had to take several days of MiraLAX without any significant improvement.  Acute diverticulitis with pelvic abscess small bowel obstruction/ileus -- IV fluids -- continue IV Zosyn -- NPO -- NG tube per surgical recommendation -- IV antiemetics -- appreciate Dr. Windell Moment surgical input --11/15-- patient to get gastrographin and study today --11/16-- gastro graph and diet passes through the colon proximal. Patient had bowel movement. NG clamped.  Hyponatremia -- likely due to volume depletion for poor PO intake -- continue IV normal saline -- came in with sodium of 123--- IV fluids-- 130  Hypokalemia, Low magnesium -- likely due to G.I. loss -- pharmacy to replete IV  Pancytopenia suspected due to chemotherapy -- patient was seen by Dr. Rogue Bussing -- she recently completed chemo last week and received a dose of G CSF -- continue to monitor counts --11/16-- white count 1.5  history of diffuse  large B-cell lymphoma -- undergoing chemotherapy with Dr. Rogue Bussing   Procedures: Family communication : patient tells me her daughter and husband have been updated Consults : oncology, surgery CODE STATUS: full code DVT Prophylaxis : enoxaparin Level of care: Telemetry Medical Status is: Inpatient  Remains inpatient appropriate because: management for acute diverticulitis with pelvic abscess        TOTAL TIME TAKING CARE OF THIS PATIENT: 25 minutes.  >50% time spent on counselling and coordination of care  Note: This dictation was prepared with Dragon dictation along with smaller phrase technology. Any transcriptional errors that result from this process are unintentional.  Fritzi Mandes M.D    [facility]   CC: Primary care physician; [name], MD Patient ID: [name], female   DOB: 02-19-1946, 75 y.o.   MRN: [id number]

## 2021-04-17 NOTE — Progress Notes (Signed)
Mary Washington   DOB:[birth year]   LE#:[id number]    Subjective: Denies any acute events overnight.  Continues to have NG tube.  This was clamped this morning.  Had a good night sleep.  She is currently comfortable.  No fever no chills.  Objective:  Vitals:   04/17/21 0800 04/17/21 1200  BP: (!) 135/55 137/61  Pulse: 86 84  Resp: 17 17  Temp: 98.7 F (37.1 C) 98.7 F (37.1 C)  SpO2: 97% 99%     Intake/Output Summary (Last 24 hours) at 04/17/2021 1358 Last data filed at 04/17/2021 0204 Gross per 24 hour  Intake --  Output 2800 ml  Net -2800 ml   Patient resting comfortably in the bed.  Has NG tube in place. Physical Exam Vitals and nursing note reviewed.  HENT:     Head: Normocephalic and atraumatic.     Mouth/Throat:     Pharynx: Oropharynx is clear.  Eyes:     Extraocular Movements: Extraocular movements intact.     Pupils: Pupils are equal, round, and reactive to light.  Cardiovascular:     Rate and Rhythm: Normal rate and regular rhythm.  Pulmonary:     Comments: Decreased breath sounds bilaterally.  Abdominal:     Palpations: Abdomen is soft.  Musculoskeletal:        General: Normal range of motion.     Cervical back: Normal range of motion.  Skin:    General: Skin is warm.  Neurological:     General: No focal deficit present.     Mental Status: She is alert and oriented to person, place, and time.  Psychiatric:        Behavior: Behavior normal.        Judgment: Judgment normal.     Labs:  Lab Results  Component Value Date   WBC 1.5 (L) 04/17/2021   HGB 9.3 (L) 04/17/2021   HCT 28.1 (L) 04/17/2021   MCV 88.9 04/17/2021   PLT 212 04/17/2021   NEUTROABS 0.8 (L) 04/17/2021    Lab Results  Component Value Date   NA 131 (L) 04/17/2021   K 2.7 (LL) 04/17/2021   CL 99 04/17/2021   CO2 20 (L) 04/17/2021    Studies:  DG Abd 1 View  Result Date: 04/15/2021 CLINICAL DATA:  NG tube placement EXAM: ABDOMEN - 1 VIEW COMPARISON:  None. FINDINGS: NG tube tip  is in the stomach. Dilated small bowel loops compatible with small bowel obstruction. IMPRESSION: NG tube tip in the stomach. Electronically Signed   By: Rolm Baptise M.D.   On: 04/15/2021 23:22   DG Abd Portable 1V-Small Bowel Obstruction Protocol-initial, 8 hr delay  Result Date: 04/16/2021 CLINICAL DATA:  Follow up small bowel obstruction EXAM: PORTABLE ABDOMEN - 1 VIEW COMPARISON:  04/15/2021 FINDINGS: Gastric catheter is again noted within the stomach. Administered contrast is seen within dilated loops of small bowel similar to that seen on the prior exam. Multiple ingested tablets are noted within the proximal colon. Contrast material has also passed into the proximal transverse colon. These changes are consistent with partial small bowel obstruction. The overall appearance is similar to that noted on prior CT examination and prior plain film. No free air is seen. Changes of prior vertebral augmentation are noted. IMPRESSION: Changes consistent with persistent partial small bowel obstruction. Contrast material remains in multiple dilated loops of small bowel but has passed into the proximal colon. Electronically Signed   By: Inez Catalina M.D.   On: 04/16/2021 19:21  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 [facility]) # 75 year old female patient with a history of diffuse B-cell lymphoma-currently s/p R-CHOP chemotherapy is currently admitted to hospital for sigmoid diverticulitis/suspected perforation/and severe neutropenia.  # Diffuse large B-cell lymphoma-stage III-currently s/p cycle #5 of R-CHOP chemotherapy; day # 8 today.  Post 3 cycles patient had significant clinical response from chemotherapy.  We will hold further chemotherapy while awaiting improvement of current acute clinically issues [see below].  # Severe neutropenia-secondary chemotherapy-s/p long-acting growth factor week ago; I expect the counts to recover in the next few days-total white count is 1.5; ANC 0.8.  Monitor  closely.  # Acute sigmoid diverticulitis/with suspected perforation-partial bowel obstruction/ileus second inflammation.: On IV antibiotics s/p evaluation with surgery;   Appreciate surgical evaluation.   [name], MD 04/17/2021  1:58 PM

## 2021-04-17 NOTE — Progress Notes (Signed)
PHARMACY CONSULT NOTE  Pharmacy Consult for Electrolyte Monitoring and Replacement   Recent Labs: Potassium (mmol/L)  Date Value  04/17/2021 2.7 (LL)   Magnesium (mg/dL)  Date Value  04/17/2021 1.8   Calcium (mg/dL)  Date Value  04/17/2021 8.4 (L)   Albumin (g/dL)  Date Value  04/15/2021 3.5   Phosphorus (mg/dL)  Date Value  04/17/2021 2.6   Sodium (mmol/L)  Date Value  04/17/2021 131 (L)    Assessment: 75 y.o. 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  MIVF: 0.9% saline at 100 mL/hr  Goal of Therapy:  Electrolytes WNL  Plan:  10 mEq IV KCl x 4 Add 20 mEq KCl/L to 0.9% saline at 100 mL/hr Recheck electrolytes in am  Dallie Piles ,PharmD Clinical Pharmacist 04/17/2021 7:44 AM

## 2021-04-17 NOTE — Progress Notes (Signed)
Patient ID: Mary Washington, female   DOB: 1945-06-20, 75 y.o.   MRN: [id number]     [place] Hospital Day(s): 2.   Interval History: Patient seen and examined, no acute events or new complaints overnight. Patient reports she had a good bowel movement at 6 AM.  She denies abdominal pain.  She endorses passing gas.  Vital signs in last 24 hours: [min-max] current  Temp:  [98 F (36.7 C)-99.2 F (37.3 C)] 98.1 F (36.7 C) (11/16 0546) Pulse Rate:  [99-111] 104 (11/16 0546) Resp:  [16] 16 (11/16 0546) BP: (132-155)/(57-74) 155/70 (11/16 0546) SpO2:  [96 %-99 %] 96 % (11/16 0546) Weight:  [52.5 kg-54.6 kg] 52.5 kg (11/16 0500)     Height: 5\' 2"  (157.5 cm) Weight: 52.5 kg BMI (Calculated): 21.16   Physical Exam:  Constitutional: alert, cooperative and no distress  Respiratory: breathing non-labored at rest  Cardiovascular: regular rate and sinus rhythm  Gastrointestinal: soft, non-tender, and mild-distended  Labs:  CBC Latest Ref Rng & Units 04/16/2021 04/15/2021 04/08/2021  WBC 4.0 - 10.5 K/uL 0.5(LL) 0.3(LL) 8.3  Hemoglobin 12.0 - 15.0 g/dL 9.3(L) 11.3(L) 11.4(L)  Hematocrit 36.0 - 46.0 % 27.8(L) 33.4(L) 34.2(L)  Platelets 150 - 400 K/uL 166 134(L) 419(H)   CMP Latest Ref Rng & Units 04/17/2021 04/16/2021 04/15/2021  Glucose 70 - 99 mg/dL 85 75 96  BUN 8 - 23 mg/dL 8 10 9   Creatinine 0.44 - 1.00 mg/dL 0.52 0.52 0.49  Sodium 135 - 145 mmol/L 131(L) 130(L) 123(L)  Potassium 3.5 - 5.1 mmol/L 2.7(LL) 3.0(L) 3.7  Chloride 98 - 111 mmol/L 99 95(L) 88(L)  CO2 22 - 32 mmol/L 20(L) 24 26  Calcium 8.9 - 10.3 mg/dL 8.4(L) 8.4(L) 9.0  Total Protein 6.5 - 8.1 g/dL - - 6.2(L)  Total Bilirubin 0.3 - 1.2 mg/dL - - 0.7  Alkaline Phos 38 - 126 U/L - - 88  AST 15 - 41 U/L - - 23  ALT 0 - 44 U/L - - 14    Imaging studies: Abdominal xray shows small bowel dilation with contras in large intestine.    Assessment/Plan:  75 y.o. female with complicated diverticulitis with pelvic  abscess, complicated by pertinent comorbidities including large B-cell lymphoma currently on chemotherapy, neutropenia.  Patient today seems to be getting better.  Vital signs continue to be stable with heart rate in the low 100s.  No fever.  Abdominal x-ray yesterday shows persistent small bowel dilation but Gastrografin getting into the large intestine.  She also had a bowel movement this morning.  I will clamp the NGT this morning and see if she gets nauseated or bloated again.  We will also repeat an x-ray around noon.  We will try to avoid any surgical management the patient with white blood cell count of 0.5.  She is very high risk of dehiscence and nonhealing complications due to lack of inflammatory response.  She seems to have the abscess under control without any fevers or any abdominal pain.  Small bowel dilation most likely due to reactive ileus due to the inflammatory process of the abscesses.  I will continue to follow closely.  Appreciate hospitalist and medical oncology for medical management of patient comorbidities.  [name], MD

## 2021-04-17 NOTE — Progress Notes (Signed)
Patient ID: Mary Washington, female   DOB: 09/17/1945, 75 y.o.   MRN: [id number] SURGERY FOLLOW UP NOTE  Patient evaluated on found sitting down in a chair.  She is seems to be comfortable.  She does feel getting a little bit more bloated than before.  No nausea yet.  No abdominal pain.  Vitals:   04/17/21 0800 04/17/21 1200  BP: (!) 135/55 137/61  Pulse: 86 84  Resp: 17 17  Temp: 98.7 F (37.1 C) 98.7 F (37.1 C)  SpO2: 97% 99%   A/P:  New abdominal x-ray shows persistent small bowel dilation.  Contrast reached a large intestine.  This is consistent with ileus caused by the inflammatory process from the abscess.  White blood cell count today 1.5 which is an improvement.  We will continue with conservative management with IV antibiotic therapy.  We will try to continue with any decline but if patient gets uncomfortable or get nauseous she can be put back to suction.  I encouraged the patient to ambulate.  I will continue to follow closely clinically with physical exam.  This follow-up encounter was more than 30 minutes most of the time counseling the patient, evaluation the images and coordinating plan of care.  Herbert Pun, MD, FACS

## 2021-04-18 ENCOUNTER — Inpatient Hospital Stay: Payer: Medicare Other

## 2021-04-18 ENCOUNTER — Encounter: Payer: Self-pay | Admitting: Internal Medicine

## 2021-04-18 DIAGNOSIS — K5792 Diverticulitis of intestine, part unspecified, without perforation or abscess without bleeding: Secondary | ICD-10-CM | POA: Diagnosis not present

## 2021-04-18 LAB — CBC WITH DIFFERENTIAL/PLATELET
Abs Immature Granulocytes: 0.15 10*3/uL — ABNORMAL HIGH (ref 0.00–0.07)
Basophils Absolute: 0 10*3/uL (ref 0.0–0.1)
Basophils Relative: 2 %
Eosinophils Absolute: 0.1 10*3/uL (ref 0.0–0.5)
Eosinophils Relative: 2 %
HCT: 26.7 % — ABNORMAL LOW (ref 36.0–46.0)
Hemoglobin: 8.8 g/dL — ABNORMAL LOW (ref 12.0–15.0)
Immature Granulocytes: 6 %
Lymphocytes Relative: 7 %
Lymphs Abs: 0.2 10*3/uL — ABNORMAL LOW (ref 0.7–4.0)
MCH: 29.1 pg (ref 26.0–34.0)
MCHC: 33 g/dL (ref 30.0–36.0)
MCV: 88.4 fL (ref 80.0–100.0)
Monocytes Absolute: 0.3 10*3/uL (ref 0.1–1.0)
Monocytes Relative: 12 %
Neutro Abs: 1.9 10*3/uL (ref 1.7–7.7)
Neutrophils Relative %: 71 %
Platelets: 227 10*3/uL (ref 150–400)
RBC: 3.02 MIL/uL — ABNORMAL LOW (ref 3.87–5.11)
RDW: 15.9 % — ABNORMAL HIGH (ref 11.5–15.5)
WBC: 2.6 10*3/uL — ABNORMAL LOW (ref 4.0–10.5)
nRBC: 0 % (ref 0.0–0.2)

## 2021-04-18 LAB — BASIC METABOLIC PANEL
Anion gap: 9 (ref 5–15)
BUN: 7 mg/dL — ABNORMAL LOW (ref 8–23)
CO2: 21 mmol/L — ABNORMAL LOW (ref 22–32)
Calcium: 8.1 mg/dL — ABNORMAL LOW (ref 8.9–10.3)
Chloride: 102 mmol/L (ref 98–111)
Creatinine, Ser: 0.49 mg/dL (ref 0.44–1.00)
GFR, Estimated: >60 mL/min (ref >60)
Glucose, Bld: 85 mg/dL (ref 70–99)
Potassium: 2.7 mmol/L — CL (ref 3.5–5.1)
Sodium: 132 mmol/L — ABNORMAL LOW (ref 135–145)

## 2021-04-18 LAB — POTASSIUM: Potassium: 3 mmol/L — ABNORMAL LOW (ref 3.5–5.1)

## 2021-04-18 LAB — MAGNESIUM: Magnesium: 1.7 mg/dL (ref 1.7–2.4)

## 2021-04-18 MED ORDER — MAGIC MOUTHWASH
5.0000 mL | Freq: Four times a day (QID) | ORAL | Status: DC | PRN
  Filled 2021-04-18: qty 10

## 2021-04-18 MED ORDER — IOHEXOL 300 MG/ML  SOLN
75.0000 mL | Freq: Once | INTRAMUSCULAR | Status: AC | PRN
  Administered 2021-04-18: 11:00:00 75 mL via INTRAVENOUS

## 2021-04-18 MED ORDER — IOHEXOL 9 MG/ML PO SOLN
500.0000 mL | ORAL | Status: AC
  Administered 2021-04-18: 10:00:00 500 mL via ORAL

## 2021-04-18 MED ORDER — POTASSIUM CHLORIDE 10 MEQ/100ML IV SOLN
10.0000 meq | INTRAVENOUS | Status: AC
  Administered 2021-04-18 (×4): 10 meq via INTRAVENOUS
  Filled 2021-04-18 (×3): qty 100

## 2021-04-18 MED ORDER — BENZOCAINE 10 % MT GEL
Freq: Two times a day (BID) | OROMUCOSAL | Status: DC
  Administered 2021-04-25: 1 via OROMUCOSAL
  Filled 2021-04-18: qty 9

## 2021-04-18 MED ORDER — POTASSIUM CHLORIDE 10 MEQ/100ML IV SOLN
10.0000 meq | INTRAVENOUS | Status: AC
  Administered 2021-04-18 – 2021-04-19 (×4): 10 meq via INTRAVENOUS
  Filled 2021-04-18: qty 100

## 2021-04-18 MED ORDER — MAGNESIUM SULFATE 2 GM/50ML IV SOLN
2.0000 g | Freq: Once | INTRAVENOUS | Status: AC
  Administered 2021-04-18: 08:00:00 2 g via INTRAVENOUS
  Filled 2021-04-18: qty 50

## 2021-04-18 NOTE — Progress Notes (Signed)
Patient ID: Mary Washington, female   DOB: [birth year], 75 y.o.   MRN: [id number]     [place] Hospital Day(s): 3.   Interval History: Patient seen and examined, no acute events or new complaints overnight. Patient reports feeling well.  She denies abdominal pain she does still feel distended.  She is sitting down to the commode trying to have a bowel movement.  She feels that something is coming.  She endorses passing small gas.  Vital signs in last 24 hours: [min-max] current  Temp:  [98.4 F (36.9 C)-99.1 F (37.3 C)] 98.4 F (36.9 C) (11/17 0410) Pulse Rate:  [79-95] 79 (11/17 0410) Resp:  [17-18] 18 (11/17 0410) BP: (131-138)/(55-63) 131/58 (11/17 0410) SpO2:  [94 %-99 %] 94 % (11/17 0410) Weight:  [52.3 kg] 52.3 kg (11/17 0500)     Height: 5\' 2"  (157.5 cm) Weight: 52.3 kg BMI (Calculated): 21.08   Physical Exam:  Constitutional: alert, cooperative and no distress  Respiratory: breathing non-labored at rest  Cardiovascular: regular rate and sinus rhythm  Gastrointestinal: soft, non-tender, and mild-distended  Labs:  CBC Latest Ref Rng & Units 04/18/2021 04/17/2021 04/16/2021  WBC 4.0 - 10.5 K/uL 2.6(L) 1.5(L) 0.5(LL)  Hemoglobin 12.0 - 15.0 g/dL 8.8(L) 9.3(L) 9.3(L)  Hematocrit 36.0 - 46.0 % 26.7(L) 28.1(L) 27.8(L)  Platelets 150 - 400 K/uL 227 212 166   CMP Latest Ref Rng & Units 04/18/2021 04/17/2021 04/16/2021  Glucose 70 - 99 mg/dL 85 85 75  BUN 8 - 23 mg/dL 7(L) 8 10  Creatinine 0.44 - 1.00 mg/dL 0.49 0.52 0.52  Sodium 135 - 145 mmol/L 132(L) 131(L) 130(L)  Potassium 3.5 - 5.1 mmol/L 2.7(LL) 2.7(LL) 3.0(L)  Chloride 98 - 111 mmol/L 102 99 95(L)  CO2 22 - 32 mmol/L 21(L) 20(L) 24  Calcium 8.9 - 10.3 mg/dL 8.1(L) 8.4(L) 8.4(L)  Total Protein 6.5 - 8.1 g/dL - - -  Total Bilirubin 0.3 - 1.2 mg/dL - - -  Alkaline Phos 38 - 126 U/L - - -  AST 15 - 41 U/L - - -  ALT 0 - 44 U/L - - -    Imaging studies: No new pertinent imaging studies   Assessment/Plan:   75 y.o. 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  Patient without any significant change on clinical status.  Continue with bowel distention.  No abdominal pain or tenderness on palpation.  No fever.  No nausea or vomiting.  Decreased output from NGT.  Neutropenia continue to slowly improve.  Today white blood cell count is 2.6.  I will order CT scan of the abdomen and pelvis with IV and oral contrast for evaluation of pelvic abscess to see if there are responding to IV antibiotic therapy or if they are amenable for percutaneous drainage.  The CT scan will also help for further evaluation of the ileus.  Today continue with hypokalemia.  Appreciate hospitalist management of electrolyte replacement.  I will follow-up closely.  [name], MD

## 2021-04-18 NOTE — Progress Notes (Signed)
Mobility Specialist - Progress Note   04/18/21 1400  Mobility  Activity Ambulated in hall  Level of Assistance Standby assist, set-up cues, supervision of patient - no hands on  Assistive Device Front wheel walker  Distance Ambulated (ft) 180 ft  Mobility Ambulated with assistance in hallway  Mobility Response Tolerated well  Mobility performed by Mobility specialist  $Mobility charge 1 Mobility    Pt ambulated in hallway with supervision. Does voice fatigue post-activity. No other complaints. HR 95, O2 98% on RA.    Kathee Delton Mobility Specialist 04/18/21, 3:00 PM

## 2021-04-18 NOTE — Progress Notes (Addendum)
[place] for Electrolyte Monitoring and Replacement   Recent Labs: Potassium (mmol/L)  Date Value  04/18/2021 3.0 (L)   Magnesium (mg/dL)  Date Value  04/18/2021 1.7   Calcium (mg/dL)  Date Value  04/18/2021 8.1 (L)   Albumin (g/dL)  Date Value  04/15/2021 3.5   Phosphorus (mg/dL)  Date Value  04/17/2021 2.6   Sodium (mmol/L)  Date Value  04/18/2021 132 (L)    Assessment: 75 y.o. 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  MIVF: 0.9% saline w/ 20 mEq KCl/L at 100 mL/hr  Goal of Therapy:  Electrolytes WNL  Plan:  Potassium improved but still low Repeat 10 mEq IV KCl x 4 Continue 0.9% saline w/ 20 mEq KCl/L at 100 mL/hr Recheck electrolytes with morning labs   Tawnya Crook, PharmD, BCPS Clinical Pharmacist 04/18/2021 7:38 PM

## 2021-04-18 NOTE — Progress Notes (Signed)
Patient ID: Mary Washington, female   DOB: 30-Apr-1946, 75 y.o.   MRN: [id number] SURGERY FOLLOW UP NOTE  Patient reevaluated this afternoon.  She endorses continued feeling the same.  She continue with abdominal distention.  She denies passing gas or bowel movement today.  She denies any nausea or vomiting.  She is connected to intermittent suction.  Vitals:   04/18/21 0745 04/18/21 1520  BP: 139/60 (!) 133/56  Pulse: 79 80  Resp: 16 16  Temp: 98 F (36.7 C) 99 F (37.2 C)  SpO2: 98% 97%   A/P:  Patient today without any clinical deterioration but without any clinical improvement.  She continue with ileus/SBO.  Clinically it is more likely ileus due to inflammatory process of abscess.  New CT scan today shows persistent small bowel dilation but there is contrast in the large intestine.  There is no significant changes of the pelvic abscesses with slight increase in size.  I personally discussed the images with interventional radiology and they are not available for drainage due to to being deep in the pelvis.  We are getting to the point that she has not improved as we wanted but she is also not deteriorating.  The fact that the white blood cell count is improving is a good sign.  Today patient received electrolytes replacement.  We will consider starting TPN tomorrow.  Appreciate hospitalist medical management optimizing the patient in case she needs surgery.  Day by day patient is getting closer to make any decision that she will need surgery if she does not get better.  The fact that she had recent chemotherapy he was also count was severely low and this is more likely an ileus I was being more conservative than usual.  I will reevaluate day by day and if ileus does not resolve and patient continued to get optimized we will need to consider surgical management.  I discussed this with the patient and she agrees that she will prefer not to have surgery.  I will continue to follow closely.  This  follow-up encounter was more than 30-minute most of the time counseling the patient evaluating the images and coordinating plan of care.  Herbert Pun, MD, FACS

## 2021-04-18 NOTE — Progress Notes (Signed)
[facility] [place] NAME: Mary Washington    MR#:  [id number]  DATE OF BIRTH:  1945-06-28  SUBJECTIVE:   patient had a bowel movement this morning. She feels overall better.  NG tube was clamped by surgery however now placed back on suction Had BM today Oral sore left buccal mucosa REVIEW OF SYSTEMS:   Review of Systems  Constitutional:  Negative for chills, fever and weight loss.  HENT:  Negative for ear discharge, ear pain and nosebleeds.   Eyes:  Negative for blurred vision, pain and discharge.  Respiratory:  Negative for sputum production, shortness of breath, wheezing and stridor.   Cardiovascular:  Negative for chest pain, palpitations, orthopnea and PND.  Gastrointestinal:  Positive for abdominal pain and nausea. Negative for diarrhea and vomiting.  Genitourinary:  Negative for frequency and urgency.  Musculoskeletal:  Negative for back pain and joint pain.  Neurological:  Positive for weakness. Negative for sensory change, speech change and focal weakness.  Psychiatric/Behavioral:  Negative for depression and hallucinations. The patient is not nervous/anxious.   Tolerating Diet:NPO Tolerating PT: pending  DRUG ALLERGIES:  No Known Allergies  VITALS:  Blood pressure 139/60, pulse 79, temperature 98 F (36.7 C), temperature source Oral, resp. rate 16, height 5\' 2"  (1.575 m), weight 52.3 kg, SpO2 98 %.  PHYSICAL EXAMINATION:   Physical Exam  GENERAL:  75 y.o.-year-old patient lying in the bed with no acute distress.  HEENT: Head atraumatic, normocephalic. Oropharynx sore _ left buccal mucosa NG+  LUNGS: Normal breath sounds bilaterally, no wheezing, rales, rhonchi. No use of accessory muscles of respiration.  CARDIOVASCULAR: S1, S2 normal. No murmurs, rubs, or gallops.  ABDOMEN: Soft, nontender, +distended.  EXTREMITIES: No cyanosis, clubbing or edema b/l.    NEUROLOGIC: nonfocal PSYCHIATRIC:  patient is alert and oriented x  3.  SKIN: No obvious rash, lesion, or ulcer.   LABORATORY PANEL:  CBC Recent Labs  Lab 04/18/21 0441  WBC 2.6*  HGB 8.8*  HCT 26.7*  PLT 227     Chemistries  Recent Labs  Lab 04/15/21 0626 04/16/21 0625 04/18/21 0441  NA 123*   < > 132*  K 3.7   < > 2.7*  CL 88*   < > 102  CO2 26   < > 21*  GLUCOSE 96   < > 85  BUN 9   < > 7*  CREATININE 0.49   < > 0.49  CALCIUM 9.0   < > 8.1*  MG  --    < > 1.7  AST 23  --   --   ALT 14  --   --   ALKPHOS 88  --   --   BILITOT 0.7  --   --    < > = values in this interval not displayed.    Cardiac Enzymes No results for input(s): TROPONINI in the last 168 hours. RADIOLOGY:  DG Abd 2 Views  Result Date: 04/17/2021 CLINICAL DATA:  Ileus EXAM: ABDOMEN - 2 VIEW COMPARISON:  04/16/2021 FINDINGS: NG tube in the stomach. Side hole in the fundus with the tip in the body the stomach. Interval improvement in small bowel dilatation. Oral contrast has entered the right colon and progressed through the small-bowel compared with yesterday. Kyphoplasty cement in L1 vertebral body. IMPRESSION: NG tube in the stomach. Improvement in small bowel dilatation. Oral contrast is now in the right colon. Electronically Signed   By: Franchot Gallo M.D.  On: 04/17/2021 14:16   DG Abd Portable 1V-Small Bowel Obstruction Protocol-initial, 8 hr delay  Result Date: 04/16/2021 CLINICAL DATA:  Follow up small bowel obstruction EXAM: PORTABLE ABDOMEN - 1 VIEW COMPARISON:  04/15/2021 FINDINGS: Gastric catheter is again noted within the stomach. Administered contrast is seen within dilated loops of small bowel similar to that seen on the prior exam. Multiple ingested tablets are noted within the proximal colon. Contrast material has also passed into the proximal transverse colon. These changes are consistent with partial small bowel obstruction. The overall appearance is similar to that noted on prior CT examination and prior plain film. No free air is seen. Changes of  prior vertebral augmentation are noted. IMPRESSION: Changes consistent with persistent partial small bowel obstruction. Contrast material remains in multiple dilated loops of small bowel but has passed into the proximal colon. Electronically Signed   By: Inez Catalina M.D.   On: 04/16/2021 19:21   ASSESSMENT AND PLAN:  Mary Washington is a 75 y.o. 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 Patient states that she gets chemotherapy every 3 weeks and her last treatment was 1 week ago on 11/07.  She states that following the treatment she developed constipation and had to take several days of MiraLAX without any significant improvement.  Acute diverticulitis with pelvic abscess small bowel obstruction/ileus -- continue IV Zosyn -- NPO -- NG tube per surgical recommendation -- IV antiemetics -- appreciate Dr. Windell Moment surgical input --11/15-- patient to get gastrographin and study today --11/16-- gastro graph and diet passes through the colon proximal. Patient had bowel movement. NG clamped. --11/17--CT abd repeated. NG back to suction, replacing K  Hyponatremia -- likely due to volume depletion for poor PO intake -- continue IV normal saline -- came in with sodium of 123--- IV fluids-- 130  Hypokalemia, Low magnesium -- likely due to G.I. loss -- pharmacy to replete IV  Pancytopenia suspected due to chemotherapy -- patient was seen by Dr. Rogue Bussing -- she recently completed chemo last week and received a dose of G CSF -- continue to monitor counts --- white count 0.5--0.5-- 1.5--2.6  history of diffuse large B-cell lymphoma -- undergoing chemotherapy with Dr. Rogue Bussing   Family communication : husband at bedside Consults : oncology, surgery CODE STATUS: full code DVT Prophylaxis : enoxaparin Level of care:  Telemetry Medical Status is: Inpatient  Remains inpatient appropriate because: management for acute diverticulitis with pelvic abscess        TOTAL TIME TAKING CARE OF THIS PATIENT: 25 minutes.  >50% time spent on counselling and coordination of care  Note: This dictation was prepared with Dragon dictation along with smaller phrase technology. Any transcriptional errors that result from this process are unintentional.  Fritzi Mandes M.D    [facility]   CC: Primary care physician; [name], MD Patient ID: Mary Washington, female   DOB: [birth year], 75 y.o.   MRN: [id number]

## 2021-04-18 NOTE — Progress Notes (Signed)
[place] for Electrolyte Monitoring and Replacement   Recent Labs: Potassium (mmol/L)  Date Value  04/18/2021 2.7 (LL)   Magnesium (mg/dL)  Date Value  04/18/2021 1.7   Calcium (mg/dL)  Date Value  04/18/2021 8.1 (L)   Albumin (g/dL)  Date Value  04/15/2021 3.5   Phosphorus (mg/dL)  Date Value  04/17/2021 2.6   Sodium (mmol/L)  Date Value  04/18/2021 132 (L)    Assessment: 75 y.o. 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  MIVF: 0.9% saline w/ 20 mEq KCl/L at 100 mL/hr  Goal of Therapy:  Electrolytes WNL  Plan:  Repeat 10 mEq IV KCl x 4 2 grams IV magnesium sulfate x 1 Continue 0.9% saline w/ 20 mEq KCl/L at 100 mL/hr Recheck electrolytes when   Dallie Piles ,PharmD Clinical Pharmacist 04/18/2021 7:20 AM

## 2021-04-18 NOTE — Progress Notes (Signed)
Mary Washington   DOB:04/01/1946   DG#:[id number]    Subjective: Denies any acute events overnight.  Continues to have NG tube.  Denies any abdominal pain or fevers.  She had a CT scan this morning.  Objective:  Vitals:   04/18/21 1520 04/18/21 1944  BP: (!) 133/56 (!) 135/47  Pulse: 80 89  Resp: 16 20  Temp: 99 F (37.2 C) 98 F (36.7 C)  SpO2: 97% 97%     Intake/Output Summary (Last 24 hours) at 04/18/2021 2034 Last data filed at 04/18/2021 1000 Gross per 24 hour  Intake 2176.51 ml  Output 1600 ml  Net 576.51 ml   Patient resting comfortably in the bed.  Has NG tube in place. Physical Exam Vitals and nursing note reviewed.  HENT:     Head: Normocephalic and atraumatic.     Mouth/Throat:     Pharynx: Oropharynx is clear.  Eyes:     Extraocular Movements: Extraocular movements intact.     Pupils: Pupils are equal, round, and reactive to light.  Cardiovascular:     Rate and Rhythm: Normal rate and regular rhythm.  Pulmonary:     Comments: Decreased breath sounds bilaterally.  Abdominal:     Palpations: Abdomen is soft.  Musculoskeletal:        General: Normal range of motion.     Cervical back: Normal range of motion.  Skin:    General: Skin is warm.  Neurological:     General: No focal deficit present.     Mental Status: She is alert and oriented to person, place, and time.  Psychiatric:        Behavior: Behavior normal.        Judgment: Judgment normal.     Labs:  Lab Results  Component Value Date   WBC 2.6 (L) 04/18/2021   HGB 8.8 (L) 04/18/2021   HCT 26.7 (L) 04/18/2021   MCV 88.4 04/18/2021   PLT 227 04/18/2021   NEUTROABS 1.9 04/18/2021    Lab Results  Component Value Date   NA 132 (L) 04/18/2021   K 3.0 (L) 04/18/2021   CL 102 04/18/2021   CO2 21 (L) 04/18/2021    Studies:  CT ABDOMEN PELVIS W CONTRAST  Result Date: 04/18/2021 CLINICAL DATA:  Abdominal pain, pelvic abscess EXAM: CT ABDOMEN AND PELVIS WITH CONTRAST TECHNIQUE: Multidetector CT  imaging of the abdomen and pelvis was performed using the standard protocol following bolus administration of intravenous contrast. CONTRAST:  46mL OMNIPAQUE IOHEXOL 300 MG/ML  SOLN COMPARISON:  04/15/2021 FINDINGS: Lower chest: There are linear patchy infiltrates in both lower lung fields with interval worsening suggesting atelectasis/pneumonia. There is no pleural effusion. Hepatobiliary: No focal abnormality is seen. Gallbladder is unremarkable. There is no significant dilation of bile ducts. Pancreas: No focal abnormality is seen Spleen: Unremarkable. Adrenals/Urinary Tract: Adrenals are unremarkable. There is no hydronephrosis. There are no renal or ureteral stones. Stomach/Bowel: Tip of enteric tube is seen in the stomach. There is dilation of small-bowel loops measuring up to 4.5 cm in diameter. There is interval worsening of small bowel dilation. There is decompression of distal small bowel loops. Zone of transition is possibly in the lower mid abdomen or upper pelvis. Oral contrast has reached the rectum. There is diffuse wall thickening in the sigmoid colon. Multiple diverticula are seen in the colon. There is inflammatory stranding in the pelvis adjacent to sigmoid. There is 4.1 x 1.7 cm loculated fluid collection in the posterior pelvis in image 54 of series  4. In the image 50, there is 2.4 x 1.7 cm loculated fluid collection with thick wall in the right side of pelvis at the level of pelvic inlet. These fluid collections have increased in size. In the image 56, therer 3.5 x 1.6 cm fluid collection in the left side of pelvis which has not changed significantly. Vascular/Lymphatic: There are scattered atherosclerotic plaques and calcifications in aorta and its major branches. No new significant lymphadenopathy seen. Reproductive: Uterus is not seen. Other: There is no evidence of pneumoperitoneum in the upper abdomen. Musculoskeletal: Decrease in height of upper endplates L2 and L3 vertebrae may suggest  recent or old compression fractures. There is previous vertebroplasty in the body of T12 vertebra. Marked degenerative changes are noted at L4-L5 level. IMPRESSION: Sigmoid diverticulitis. There are few loculated fluid collections in the pelvis some of which appear larger in size. Findings suggest small pericolic abscesses related to acute diverticulitis. There is abnormal dilation of proximal small bowel loops measuring up to 4.5 cm in diameter with interval worsening. This may be due to adhesions or internal hernia. Level of obstruction appears to be in the lower mid abdomen or upper pelvis. Distal small bowel loops are not dilated. There are patchy infiltrates in both lower lung fields with interval worsening suggesting worsening of atelectasis/pneumonia. There is mild compression of upper endplates of bodies of L2 and L3 vertebrae suggesting recent or old fractures. Electronically Signed   By: Elmer Picker M.D.   On: 04/18/2021 13:53   DG Abd 2 Views  Result Date: 04/17/2021 CLINICAL DATA:  Ileus EXAM: ABDOMEN - 2 VIEW COMPARISON:  04/16/2021 FINDINGS: NG tube in the stomach. Side hole in the fundus with the tip in the body the stomach. Interval improvement in small bowel dilatation. Oral contrast has entered the right colon and progressed through the small-bowel compared with yesterday. Kyphoplasty cement in L1 vertebral body. IMPRESSION: NG tube in the stomach. Improvement in small bowel dilatation. Oral contrast is now in the right colon. Electronically Signed   By: Franchot Gallo M.D.   On: 04/17/2021 14:16    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 [facility]) # 75 year old female patient with a history of diffuse B-cell lymphoma-currently s/p R-CHOP chemotherapy is currently admitted to hospital for sigmoid diverticulitis/suspected perforation/and severe neutropenia.  # Diffuse large B-cell lymphoma-stage III-currently s/p cycle #5 of R-CHOP chemotherapy; day # 8 today.  Post 3 cycles patient  had significant clinical response from chemotherapy.  We will hold further chemotherapy while awaiting improvement of current acute clinically issues [see below].  # Severe neutropenia-secondary chemotherapy-s/p long-acting growth factor postchemotherapy.; I expect the counts to recover in the next few days ANCs 1.9.  Monitor closely.  # Acute sigmoid diverticulitis/with suspected perforation-partial bowel obstruction/ileus second inflammation.: On IV antibiotics s/p evaluation with surgery;   Appreciate surgical evaluation.  Clinically stable.  Awaiting repeat CT scan this morning.  #Severe electrolyte abnormalities-supplemented/monitored by hospitalist service closely.  [name], MD 04/18/2021  8:34 PM

## 2021-04-18 NOTE — Care Management Important Message (Signed)
Important Message  Patient Details  Name: Mary Washington MRN: [id number] Date of Birth: 05-22-1946   Medicare Important Message Given:  Yes     Dannette Barbara 04/18/2021, 10:32 AM

## 2021-04-19 ENCOUNTER — Inpatient Hospital Stay: Payer: Self-pay

## 2021-04-19 DIAGNOSIS — K5792 Diverticulitis of intestine, part unspecified, without perforation or abscess without bleeding: Secondary | ICD-10-CM | POA: Diagnosis not present

## 2021-04-19 LAB — COMPREHENSIVE METABOLIC PANEL
ALT: 13 U/L (ref 0–44)
AST: 24 U/L (ref 15–41)
Albumin: 3 g/dL — ABNORMAL LOW (ref 3.5–5.0)
Alkaline Phosphatase: 73 U/L (ref 38–126)
Anion gap: 10 (ref 5–15)
BUN: <5 mg/dL — ABNORMAL LOW (ref 8–23)
CO2: 22 mmol/L (ref 22–32)
Calcium: 8.3 mg/dL — ABNORMAL LOW (ref 8.9–10.3)
Chloride: 92 mmol/L — ABNORMAL LOW (ref 98–111)
Creatinine, Ser: 0.49 mg/dL (ref 0.44–1.00)
GFR, Estimated: >60 mL/min (ref >60)
Glucose, Bld: 84 mg/dL (ref 70–99)
Potassium: 3.7 mmol/L (ref 3.5–5.1)
Sodium: 124 mmol/L — ABNORMAL LOW (ref 135–145)
Total Bilirubin: 1.1 mg/dL (ref 0.3–1.2)
Total Protein: 5.7 g/dL — ABNORMAL LOW (ref 6.5–8.1)

## 2021-04-19 LAB — BASIC METABOLIC PANEL
Anion gap: 10 (ref 5–15)
BUN: <5 mg/dL — ABNORMAL LOW (ref 8–23)
CO2: 22 mmol/L (ref 22–32)
Calcium: 8 mg/dL — ABNORMAL LOW (ref 8.9–10.3)
Chloride: 94 mmol/L — ABNORMAL LOW (ref 98–111)
Creatinine, Ser: 0.49 mg/dL (ref 0.44–1.00)
GFR, Estimated: >60 mL/min (ref >60)
Glucose, Bld: 84 mg/dL (ref 70–99)
Potassium: 3 mmol/L — ABNORMAL LOW (ref 3.5–5.1)
Sodium: 126 mmol/L — ABNORMAL LOW (ref 135–145)

## 2021-04-19 LAB — CBC WITH DIFFERENTIAL/PLATELET
Abs Immature Granulocytes: 0.37 10*3/uL — ABNORMAL HIGH (ref 0.00–0.07)
Basophils Absolute: 0 10*3/uL (ref 0.0–0.1)
Basophils Relative: 1 %
Eosinophils Absolute: 0.1 10*3/uL (ref 0.0–0.5)
Eosinophils Relative: 1 %
HCT: 29.1 % — ABNORMAL LOW (ref 36.0–46.0)
Hemoglobin: 9.6 g/dL — ABNORMAL LOW (ref 12.0–15.0)
Immature Granulocytes: 7 %
Lymphocytes Relative: 4 %
Lymphs Abs: 0.2 10*3/uL — ABNORMAL LOW (ref 0.7–4.0)
MCH: 28.9 pg (ref 26.0–34.0)
MCHC: 33 g/dL (ref 30.0–36.0)
MCV: 87.7 fL (ref 80.0–100.0)
Monocytes Absolute: 0.5 10*3/uL (ref 0.1–1.0)
Monocytes Relative: 11 %
Neutro Abs: 3.8 10*3/uL (ref 1.7–7.7)
Neutrophils Relative %: 76 %
Platelets: 262 10*3/uL (ref 150–400)
RBC: 3.32 MIL/uL — ABNORMAL LOW (ref 3.87–5.11)
RDW: 15.7 % — ABNORMAL HIGH (ref 11.5–15.5)
Smear Review: NORMAL
WBC: 5 10*3/uL (ref 4.0–10.5)
nRBC: 0 % (ref 0.0–0.2)

## 2021-04-19 LAB — MAGNESIUM
Magnesium: 1.7 mg/dL (ref 1.7–2.4)
Magnesium: 2 mg/dL (ref 1.7–2.4)

## 2021-04-19 LAB — TRIGLYCERIDES: Triglycerides: 111 mg/dL (ref <150)

## 2021-04-19 MED ORDER — SODIUM CHLORIDE 0.9 % IV SOLN: INTRAVENOUS | Status: DC | PRN

## 2021-04-19 MED ORDER — POTASSIUM CHLORIDE IN NACL 40-0.9 MEQ/L-% IV SOLN
INTRAVENOUS | Status: AC
  Filled 2021-04-19 (×3): qty 1000

## 2021-04-19 MED ORDER — MAGNESIUM SULFATE 2 GM/50ML IV SOLN
2.0000 g | Freq: Once | INTRAVENOUS | Status: AC
  Administered 2021-04-19: 2 g via INTRAVENOUS
  Filled 2021-04-19: qty 50

## 2021-04-19 MED ORDER — INSULIN ASPART 100 UNIT/ML IJ SOLN
0.0000 [IU] | Freq: Four times a day (QID) | INTRAMUSCULAR | Status: DC
Start: 2021-04-20 — End: 2021-05-02
  Administered 2021-04-20: 12:00:00 2 [IU] via SUBCUTANEOUS
  Administered 2021-04-20 – 2021-04-21 (×3): 1 [IU] via SUBCUTANEOUS
  Administered 2021-04-21 – 2021-04-22 (×3): 2 [IU] via SUBCUTANEOUS
  Administered 2021-04-22 – 2021-04-23 (×3): 1 [IU] via SUBCUTANEOUS
  Administered 2021-04-23 – 2021-04-24 (×4): 2 [IU] via SUBCUTANEOUS
  Administered 2021-04-25: 1 [IU] via SUBCUTANEOUS
  Administered 2021-04-25: 2 [IU] via SUBCUTANEOUS
  Administered 2021-04-25 – 2021-04-26 (×2): 1 [IU] via SUBCUTANEOUS
  Administered 2021-04-26 (×2): 2 [IU] via SUBCUTANEOUS
  Administered 2021-04-26: 1 [IU] via SUBCUTANEOUS
  Administered 2021-04-26 – 2021-04-27 (×2): 2 [IU] via SUBCUTANEOUS
  Administered 2021-04-27 (×2): 1 [IU] via SUBCUTANEOUS
  Administered 2021-04-28 (×2): 2 [IU] via SUBCUTANEOUS
  Administered 2021-04-28 (×2): 1 [IU] via SUBCUTANEOUS
  Administered 2021-04-29: 06:00:00 2 [IU] via SUBCUTANEOUS
  Administered 2021-04-29 – 2021-05-02 (×9): 1 [IU] via SUBCUTANEOUS
  Filled 2021-04-19 (×36): qty 1

## 2021-04-19 MED ORDER — POTASSIUM CHLORIDE 10 MEQ/100ML IV SOLN
10.0000 meq | INTRAVENOUS | Status: AC
  Administered 2021-04-19 (×4): 10 meq via INTRAVENOUS
  Filled 2021-04-19 (×4): qty 100

## 2021-04-19 MED ORDER — POTASSIUM CHLORIDE IN NACL 40-0.9 MEQ/L-% IV SOLN: INTRAVENOUS | Status: DC

## 2021-04-19 MED ORDER — TRAVASOL 10 % IV SOLN
INTRAVENOUS | Status: AC
  Filled 2021-04-19: qty 300

## 2021-04-19 NOTE — Progress Notes (Signed)
Nutrition Follow Up Note   DOCUMENTATION CODES:   Not applicable  INTERVENTION:   TPN per pharmacy   Recommend thiamine 126m daily in TPN x 3 days  Pt at high refeed risk; recommend monitor potassium, magnesium and phosphorus labs daily until stable  Daily weights   NUTRITION DIAGNOSIS:   Inadequate oral intake related to acute illness as evidenced by NPO status. -ongoing   GOAL:   Patient will meet greater than or equal to 90% of their needs -not met   MONITOR:   Diet advancement, Labs, Weight trends, Skin, I & O's, TPN  ASSESSMENT:   75y.o. 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  Pt with continued abdominal pain and distension. Pt is having BMs but no flatus. NGT in place with [id number]. CT scan from 11/17 reports multiple fluid collections with abscess and dilation of proximal small bowel loops with transition point. Pt remains NPO since admission. Plan is for TPN today. Per Oncology, ok to use pt's port for TPN. Pt is at high refeed risk. Plan is for medical management for now with possible exploratory laparotomy with partial colectomy, end colostomy and drainage of intra-abdominal abscess next week if no improvement. Per chart, pt is down 6lbs(5%) since admission.   Medications reviewed and include: lovenox, insulin, protonix, NaCl w/ KCl '@100ml' /hr, zosyn, KCl  Labs reviewed: Na 126(L), K 3.0(L), BUN <5(L), Mg 1.7 wnl P 2.6 wnl- 11/16 Hgb 9.6(L), Hct 29.1(L)  Diet Order:   Diet Order             Diet NPO time specified  Diet effective now                  EDUCATION NEEDS:   Education needs have been addressed  Skin:  Skin Assessment: Reviewed RN Assessment  Last BM:  11/17  Height:   Ht Readings from Last 1 Encounters:  04/15/21 '5\' 2"'  (1.575 m)    Weight:   Wt  Readings from Last 1 Encounters:  04/19/21 49 kg    Ideal Body Weight:  50 kg  BMI:  Body mass index is 19.75 kg/m.  Estimated Nutritional Needs:   Kcal:  1500-1700kcal/day  Protein:  75-85g/day  Fluid:  1.3-1.5L/day  CaKoleen DistanceS, RD, LDN Please refer to [facility]or RD and/or RD on-call/weekend/after hours pager

## 2021-04-19 NOTE — Progress Notes (Signed)
Patient ID: Mary Washington, female   DOB: [birth year], 75 y.o.   MRN: [id number]     [place] Hospital Day(s): 4.   Interval History: Patient seen and examined, no acute events or new complaints overnight. Patient reports feeling okay.  Patient denies abdominal pain.  She does endorses having intermittent bloating sensation.  He also feels that she has passed small amount of stool.  Not feeling much gas passing.  Vital signs in last 24 hours: [min-max] current  Temp:  [97.4 F (36.3 C)-99 F (37.2 C)] 97.4 F (36.3 C) (11/18 0938) Pulse Rate:  [80-99] 89 (11/18 0938) Resp:  [15-20] 15 (11/18 0938) BP: (133-150)/(47-66) 150/64 (11/18 0938) SpO2:  [97 %-99 %] 99 % (11/18 0938) Weight:  [49 kg] 49 kg (11/18 0306)     Height: 5\' 2"  (157.5 cm) Weight: 49 kg BMI (Calculated): 19.75   Physical Exam:  Constitutional: alert, cooperative and no distress  Respiratory: breathing non-labored at rest  Cardiovascular: regular rate and sinus rhythm  Gastrointestinal: soft, non-tender, and mild distended  Labs:  CBC Latest Ref Rng & Units 04/19/2021 04/18/2021 04/17/2021  WBC 4.0 - 10.5 K/uL 5.0 2.6(L) 1.5(L)  Hemoglobin 12.0 - 15.0 g/dL 9.6(L) 8.8(L) 9.3(L)  Hematocrit 36.0 - 46.0 % 29.1(L) 26.7(L) 28.1(L)  Platelets 150 - 400 K/uL 262 227 212   CMP Latest Ref Rng & Units 04/19/2021 04/18/2021 04/18/2021  Glucose 70 - 99 mg/dL 84 - 85  BUN 8 - 23 mg/dL <5(L) - 7(L)  Creatinine 0.44 - 1.00 mg/dL 0.49 - 0.49  Sodium 135 - 145 mmol/L 126(L) - 132(L)  Potassium 3.5 - 5.1 mmol/L 3.0(L) 3.0(L) 2.7(LL)  Chloride 98 - 111 mmol/L 94(L) - 102  CO2 22 - 32 mmol/L 22 - 21(L)  Calcium 8.9 - 10.3 mg/dL 8.0(L) - 8.1(L)  Total Protein 6.5 - 8.1 g/dL - - -  Total Bilirubin 0.3 - 1.2 mg/dL - - -  Alkaline Phos 38 - 126 U/L - - -  AST 15 - 41 U/L - - -  ALT 0 - 44 U/L - - -    Imaging studies: No new pertinent imaging studies   Assessment/Plan:  76 y.o. female with complicated  diverticulitis with pelvic abscess, complicated by pertinent comorbidities including large B-cell lymphoma currently on chemotherapy, neutropenia (resolved), anemia.  Patient today without clinical deterioration.  She continue without fever.  Heart rate within normal limits.  Abdominal exam unchanged.  There is mild distention but no tender to palpation.  Still tympanic.  She does endorses passing more amount of stool.  Not much gas.  I signed out with her and her husband to set up plan.  I discussed the alternative of continued medical/conservative management with IV antibiotic therapy and NGT decompression.  The goal of this is to try to avoid major surgery which will prolong her ileus and will need a colostomy.  Also since patient received chemotherapy a week before admission she was consider high risk for surgery.  At this point her neutropenia has resolved and her anemia is improving.  Also discussed surgical management.  This will include partial colectomy with end colostomy creation, drainage of intra-abdominal abscess.  Discussed the risk of surgery that includes bleeding, infection, prolonged ileus, intra-abdominal infections among others.  We set up plan to give her another 48 to 72 hours of medical management.  If she does not shows any signs of improvement we will consider expiratory laparotomy with partial colectomy, end colostomy, drainage  of intra-abdominal abscess.  We will start TPN today, we will continue with IV antibiotic therapy.  I encouraged the patient to ambulate.  We will continue with NGT decompression.  Patient and her husband agree with plan.  I discussed case with hospitalist and medical oncologist.  I also discussed the case with pharmacist to start TPN.  [name], MD

## 2021-04-19 NOTE — Progress Notes (Signed)
Mary Washington   DOB:26-Dec-1945   QM#:[id number]    Subjective: Denies any acute events overnight.  Continues to have NG tube.  Denies any abdominal pain or fevers.    Objective:  Vitals:   04/19/21 0938 04/19/21 1525  BP: (!) 150/64 (!) 156/58  Pulse: 89 86  Resp: 15 16  Temp: (!) 97.4 F (36.3 C) 98.4 F (36.9 C)  SpO2: 99% 99%     Intake/Output Summary (Last 24 hours) at 04/19/2021 1644 Last data filed at 04/19/2021 0433 Gross per 24 hour  Intake --  Output 850 ml  Net -850 ml   Patient resting comfortably in the bed.  Has NG tube in place. Physical Exam Vitals and nursing note reviewed.  HENT:     Head: Normocephalic and atraumatic.     Mouth/Throat:     Pharynx: Oropharynx is clear.  Eyes:     Extraocular Movements: Extraocular movements intact.     Pupils: Pupils are equal, round, and reactive to light.  Cardiovascular:     Rate and Rhythm: Normal rate and regular rhythm.  Pulmonary:     Comments: Decreased breath sounds bilaterally.  Abdominal:     Palpations: Abdomen is soft.  Musculoskeletal:        General: Normal range of motion.     Cervical back: Normal range of motion.  Skin:    General: Skin is warm.  Neurological:     General: No focal deficit present.     Mental Status: She is alert and oriented to person, place, and time.  Psychiatric:        Behavior: Behavior normal.        Judgment: Judgment normal.     Labs:  Lab Results  Component Value Date   WBC 5.0 04/19/2021   HGB 9.6 (L) 04/19/2021   HCT 29.1 (L) 04/19/2021   MCV 87.7 04/19/2021   PLT 262 04/19/2021   NEUTROABS 3.8 04/19/2021    Lab Results  Component Value Date   NA 126 (L) 04/19/2021   K 3.0 (L) 04/19/2021   CL 94 (L) 04/19/2021   CO2 22 04/19/2021    Studies:  CT ABDOMEN PELVIS W CONTRAST  Result Date: 04/18/2021 CLINICAL DATA:  Abdominal pain, pelvic abscess EXAM: CT ABDOMEN AND PELVIS WITH CONTRAST TECHNIQUE: Multidetector CT imaging of the abdomen and pelvis was  performed using the standard protocol following bolus administration of intravenous contrast. CONTRAST:  66mL OMNIPAQUE IOHEXOL 300 MG/ML  SOLN COMPARISON:  04/15/2021 FINDINGS: Lower chest: There are linear patchy infiltrates in both lower lung fields with interval worsening suggesting atelectasis/pneumonia. There is no pleural effusion. Hepatobiliary: No focal abnormality is seen. Gallbladder is unremarkable. There is no significant dilation of bile ducts. Pancreas: No focal abnormality is seen Spleen: Unremarkable. Adrenals/Urinary Tract: Adrenals are unremarkable. There is no hydronephrosis. There are no renal or ureteral stones. Stomach/Bowel: Tip of enteric tube is seen in the stomach. There is dilation of small-bowel loops measuring up to 4.5 cm in diameter. There is interval worsening of small bowel dilation. There is decompression of distal small bowel loops. Zone of transition is possibly in the lower mid abdomen or upper pelvis. Oral contrast has reached the rectum. There is diffuse wall thickening in the sigmoid colon. Multiple diverticula are seen in the colon. There is inflammatory stranding in the pelvis adjacent to sigmoid. There is 4.1 x 1.7 cm loculated fluid collection in the posterior pelvis in image 54 of series 4. In the image 50, there is  2.4 x 1.7 cm loculated fluid collection with thick wall in the right side of pelvis at the level of pelvic inlet. These fluid collections have increased in size. In the image 56, therer 3.5 x 1.6 cm fluid collection in the left side of pelvis which has not changed significantly. Vascular/Lymphatic: There are scattered atherosclerotic plaques and calcifications in aorta and its major branches. No new significant lymphadenopathy seen. Reproductive: Uterus is not seen. Other: There is no evidence of pneumoperitoneum in the upper abdomen. Musculoskeletal: Decrease in height of upper endplates L2 and L3 vertebrae may suggest recent or old compression fractures.  There is previous vertebroplasty in the body of T12 vertebra. Marked degenerative changes are noted at L4-L5 level. IMPRESSION: Sigmoid diverticulitis. There are few loculated fluid collections in the pelvis some of which appear larger in size. Findings suggest small pericolic abscesses related to acute diverticulitis. There is abnormal dilation of proximal small bowel loops measuring up to 4.5 cm in diameter with interval worsening. This may be due to adhesions or internal hernia. Level of obstruction appears to be in the lower mid abdomen or upper pelvis. Distal small bowel loops are not dilated. There are patchy infiltrates in both lower lung fields with interval worsening suggesting worsening of atelectasis/pneumonia. There is mild compression of upper endplates of bodies of L2 and L3 vertebrae suggesting recent or old fractures. Electronically Signed   By: Elmer Picker M.D.   On: 04/18/2021 13:53   [place] EKG SITE RITE  Result Date: 04/19/2021 If Site Rite image not attached, placement could not be confirmed due to current cardiac rhythm.   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 [facility]) # 75 year old female patient with a history of diffuse B-cell lymphoma-currently s/p R-CHOP chemotherapy is currently admitted to hospital for sigmoid diverticulitis/suspected perforation/and severe neutropenia.  # Diffuse large B-cell lymphoma-stage III-currently s/p cycle #5 of R-CHOP chemotherapy; day # 8 today.  Post 3 cycles patient had significant clinical response from chemotherapy.  We will hold further chemotherapy while awaiting improvement of current acute clinically issues [see below].  # Severe neutropenia-secondary chemotherapy-s/p long-acting growth factor post chemotherapy; today white count is 5 ANCs 3.2.    # Acute sigmoid diverticulitis/with suspected perforation-partial bowel obstruction/ileus secondary to inflammation.: On IV antibiotics s/p evaluation with surgery.  Given clinical  stability-conservative approach at this time.  However surgical option to be reevaluated on Monday.  #Severe electrolyte abnormalities-supplemented/monitored by hospitalist service closely.  Agree with TPN.  Discussed with Dr. Peyton Najjar.  [name], MD 04/19/2021  4:44 PM

## 2021-04-19 NOTE — Progress Notes (Signed)
Mobility Specialist - Progress Note   04/19/21 1539  Mobility  Activity Ambulated in hall  Level of Assistance Standby assist, set-up cues, supervision of patient - no hands on  Assistive Device Front wheel walker  Distance Ambulated (ft) 380 ft  Mobility Ambulated with assistance in hallway  Mobility Response Tolerated well  Mobility performed by Mobility specialist  $Mobility charge 1 Mobility    During mobility: 103  HR, 91% SpO2   Pt ambulated in hallway with supervision. Mild SOB on RA, O2 maintained > 90%. Winded post-activity. Pt returned to bed with needs in reach. Family at bedside.    Kathee Delton Mobility Specialist 04/19/21, 3:41 PM

## 2021-04-19 NOTE — Progress Notes (Signed)
[facility] [place] NAME: Mary Washington    MR#:  [id number]  DATE OF BIRTH:  Jul 13, 1945  SUBJECTIVE:   patient had a bowel movement this morning. Continues with on and off some queasiness with her abdomen. NG tube with suction about 1800 mL tells me she did not sleep too well.  REVIEW OF SYSTEMS:   Review of Systems  Constitutional:  Negative for chills, fever and weight loss.  HENT:  Negative for ear discharge, ear pain and nosebleeds.   Eyes:  Negative for blurred vision, pain and discharge.  Respiratory:  Negative for sputum production, shortness of breath, wheezing and stridor.   Cardiovascular:  Negative for chest pain, palpitations, orthopnea and PND.  Gastrointestinal:  Positive for abdominal pain and nausea. Negative for diarrhea and vomiting.  Genitourinary:  Negative for frequency and urgency.  Musculoskeletal:  Negative for back pain and joint pain.  Neurological:  Positive for weakness. Negative for sensory change, speech change and focal weakness.  Psychiatric/Behavioral:  Negative for depression and hallucinations. The patient is not nervous/anxious.   Tolerating Diet:NPO Tolerating PT: ambulating with therapy  DRUG ALLERGIES:  No Known Allergies  VITALS:  Blood pressure (!) 150/64, pulse 89, temperature (!) 97.4 F (36.3 C), temperature source Oral, resp. rate 15, height '5\' 2"'  (1.575 m), weight 49 kg, SpO2 99 %.  PHYSICAL EXAMINATION:   Physical Exam  GENERAL:  75 y.o.-year-old patient lying in the bed with no acute distress.  HEENT: Head atraumatic, normocephalic. Oropharynx sore  on left buccal mucosa NG+  LUNGS: Normal breath sounds bilaterally, no wheezing, rales, rhonchi. No use of accessory muscles of respiration.  CARDIOVASCULAR: S1, S2 normal. No murmurs, rubs, or gallops.  ABDOMEN: Soft, nontender, +distended.  EXTREMITIES: No cyanosis, clubbing or edema b/l.    NEUROLOGIC: nonfocal PSYCHIATRIC:  patient  is alert and oriented x 3.  SKIN: No obvious rash, lesion, or ulcer.   LABORATORY PANEL:  CBC Recent Labs  Lab 04/19/21 0440  WBC 5.0  HGB 9.6*  HCT 29.1*  PLT 262     Chemistries  Recent Labs  Lab 04/15/21 0626 04/16/21 0625 04/19/21 0440  NA 123*   < > 126*  K 3.7   < > 3.0*  CL 88*   < > 94*  CO2 26   < > 22  GLUCOSE 96   < > 84  BUN 9   < > <5*  CREATININE 0.49   < > 0.49  CALCIUM 9.0   < > 8.0*  MG  --    < > 1.7  AST 23  --   --   ALT 14  --   --   ALKPHOS 88  --   --   BILITOT 0.7  --   --    < > = values in this interval not displayed.    Cardiac Enzymes No results for input(s): TROPONINI in the last 168 hours. RADIOLOGY:  CT ABDOMEN PELVIS W CONTRAST  Result Date: 04/18/2021 CLINICAL DATA:  Abdominal pain, pelvic abscess EXAM: CT ABDOMEN AND PELVIS WITH CONTRAST TECHNIQUE: Multidetector CT imaging of the abdomen and pelvis was performed using the standard protocol following bolus administration of intravenous contrast. CONTRAST:  62m OMNIPAQUE IOHEXOL 300 MG/ML  SOLN COMPARISON:  04/15/2021 FINDINGS: Lower chest: There are linear patchy infiltrates in both lower lung fields with interval worsening suggesting atelectasis/pneumonia. There is no pleural effusion. Hepatobiliary: No focal abnormality is seen. Gallbladder is unremarkable.  There is no significant dilation of bile ducts. Pancreas: No focal abnormality is seen Spleen: Unremarkable. Adrenals/Urinary Tract: Adrenals are unremarkable. There is no hydronephrosis. There are no renal or ureteral stones. Stomach/Bowel: Tip of enteric tube is seen in the stomach. There is dilation of small-bowel loops measuring up to 4.5 cm in diameter. There is interval worsening of small bowel dilation. There is decompression of distal small bowel loops. Zone of transition is possibly in the lower mid abdomen or upper pelvis. Oral contrast has reached the rectum. There is diffuse wall thickening in the sigmoid colon. Multiple  diverticula are seen in the colon. There is inflammatory stranding in the pelvis adjacent to sigmoid. There is 4.1 x 1.7 cm loculated fluid collection in the posterior pelvis in image 54 of series 4. In the image 50, there is 2.4 x 1.7 cm loculated fluid collection with thick wall in the right side of pelvis at the level of pelvic inlet. These fluid collections have increased in size. In the image 56, therer 3.5 x 1.6 cm fluid collection in the left side of pelvis which has not changed significantly. Vascular/Lymphatic: There are scattered atherosclerotic plaques and calcifications in aorta and its major branches. No new significant lymphadenopathy seen. Reproductive: Uterus is not seen. Other: There is no evidence of pneumoperitoneum in the upper abdomen. Musculoskeletal: Decrease in height of upper endplates L2 and L3 vertebrae may suggest recent or old compression fractures. There is previous vertebroplasty in the body of T12 vertebra. Marked degenerative changes are noted at L4-L5 level. IMPRESSION: Sigmoid diverticulitis. There are few loculated fluid collections in the pelvis some of which appear larger in size. Findings suggest small pericolic abscesses related to acute diverticulitis. There is abnormal dilation of proximal small bowel loops measuring up to 4.5 cm in diameter with interval worsening. This may be due to adhesions or internal hernia. Level of obstruction appears to be in the lower mid abdomen or upper pelvis. Distal small bowel loops are not dilated. There are patchy infiltrates in both lower lung fields with interval worsening suggesting worsening of atelectasis/pneumonia. There is mild compression of upper endplates of bodies of L2 and L3 vertebrae suggesting recent or old fractures. Electronically Signed   By: Elmer Picker M.D.   On: 04/18/2021 13:53   DG Abd 2 Views  Result Date: 04/17/2021 CLINICAL DATA:  Ileus EXAM: ABDOMEN - 2 VIEW COMPARISON:  04/16/2021 FINDINGS: NG tube in  the stomach. Side hole in the fundus with the tip in the body the stomach. Interval improvement in small bowel dilatation. Oral contrast has entered the right colon and progressed through the small-bowel compared with yesterday. Kyphoplasty cement in L1 vertebral body. IMPRESSION: NG tube in the stomach. Improvement in small bowel dilatation. Oral contrast is now in the right colon. Electronically Signed   By: Franchot Gallo M.D.   On: 04/17/2021 14:16   [place] EKG SITE RITE  Result Date: 04/19/2021 If Site Rite image not attached, placement could not be confirmed due to current cardiac rhythm.  ASSESSMENT AND PLAN:  Shawan Tosh is a 75 y.o. 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 Patient states that she gets chemotherapy every 3 weeks and her last treatment was 1 week ago on 11/07.  She states that following the treatment she developed constipation and had to take several days of MiraLAX without any  significant improvement.  Acute diverticulitis with pelvic abscess small bowel obstruction/ileus -- continue IV Zosyn -- NPO -- NG tube per surgical recommendation -- IV antiemetics -- appreciate Dr. Windell Moment surgical input --11/15-- patient to get gastrographin and study today --11/16-- gastro graph and diet passes through the colon proximal. Patient had bowel movement. NG clamped. --11/17--CT abd repeated. NG back to suction, replacing K --11/18-- repeat CT shows ileus with some small bowel loops distended. Patient has lot related pelvic fluid collection/abscess. -- Discussed with Dr. Windell Moment abscesses are not amenable for CT guided drain. Will continue conservative management over the weekend and if patient does not show improvement then she'll need exploratory laparotomy next week. -- Continues with high NG  output  Hyponatremia -- likely due to volume depletion for poor PO intake -- continue IV normal saline -- came in with sodium of 123--- IV fluids-- 130  Hypokalemia, Low magnesium -- likely due to G.I. loss -- pharmacy to replete IV  Pancytopenia suspected due to chemotherapy -- patient was seen by Dr. Rogue Bussing -- she recently completed chemo last week and received a dose of G CSF -- continue to monitor counts --- white count 0.5--0.5-- 1.5--2.6--5.6  history of diffuse large B-cell lymphoma -- undergoing chemotherapy with Dr. Rogue Bussing (last treatment two weeks)   Family communication : husband met Dr. Windell Moment today per pt Consults : oncology, surgery CODE STATUS: full code DVT Prophylaxis : enoxaparin Level of care: Telemetry Medical Status is: Inpatient  Remains inpatient appropriate because: management for acute diverticulitis with pelvic abscess        TOTAL TIME TAKING CARE OF THIS PATIENT: 25 minutes.  >50% time spent on counselling and coordination of care  Note: This dictation was prepared with Dragon dictation along with smaller phrase technology. Any transcriptional errors that result from this process are unintentional.  Fritzi Mandes M.D    [facility]   CC: Primary care physician; [name], MD Patient ID: [name], female   DOB: 1945/12/06, 75 y.o.   MRN: [id number]

## 2021-04-19 NOTE — Progress Notes (Signed)
PHARMACY - TOTAL PARENTERAL NUTRITION CONSULT NOTE   Indication: Prolonged ileus  Patient Measurements: Height: 5\' 2"  (157.5 cm) Weight: 49 kg (108 lb) IBW/kg (Calculated) : 50.1 TPN AdjBW (KG): 51.7 Body mass index is 19.75 kg/m.  Assessment: 75 y.o. 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  Glucose / Insulin: no h/o diabetes, no insulin requirements Electrolytes: hypokalemia, hypomagnesemia Renal: SCr<1, stable Hepatic: LFTs wnl at baseline Intake / Output; MIVF: 0.9% saline w/ 40 mEq KCl/L at 100 mL/hr GI Imaging: 11/17 CT Abd: Sigmoid diverticulitis GI Surgeries / Procedures: no recent surgical interventions  Central access: 11/23/20 TPN start date: 04/19/21  Nutritional Goals: Goal TPN rate is 75 mL/hr (provides 90 g of protein and 1650 kcals per day)  RD Assessment: Estimated Needs Total Energy Estimated Needs: 1500-1700kcal/day Total Protein Estimated Needs: 75-85g/day Total Fluid Estimated Needs: 1.3-1.5L/day  Current Nutrition:  NPO  Plan:  Start TPN at 25 mL/hr at 1800  Total volume including overfill 700 mL Nutritional Components: Amino acids (using 10% Travasol: 30 grams  Dextrose: 78 grams Lipids (using 20% SMOFlipid): 16.5 grams Total kCal: 550 / 24h Electrolytes in TPN: Na 146mEq/L, K 82mEq/L, Ca 1mEq/L, Mg 10  mEq/L, and Phos 62mmol/L. Cl:Ac 1:1 Add standard MVI and trace elements to TPN Initiate Sensitive q6h SSI and adjust as needed  Reduce MIVF to 75 mL/hr at 1800 Monitor TPN labs on Mon/Thurs, daily until stable  Dallie Piles 04/19/2021,8:51 AM

## 2021-04-19 NOTE — Progress Notes (Signed)
[facility] for Electrolyte Monitoring and Replacement   Recent Labs: Potassium (mmol/L)  Date Value  04/19/2021 3.0 (L)   Magnesium (mg/dL)  Date Value  04/19/2021 1.7   Calcium (mg/dL)  Date Value  04/19/2021 8.0 (L)   Albumin (g/dL)  Date Value  04/15/2021 3.5   Phosphorus (mg/dL)  Date Value  04/17/2021 2.6   Sodium (mmol/L)  Date Value  04/19/2021 126 (L)    Assessment: 75 y.o. 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  MIVF: 0.9% saline w/ 20 mEq KCl/L at 100 mL/hr  Goal of Therapy:  Electrolytes WNL  Plan:  2 grams IV magnesium sulfate x 1 Repeat 10 mEq IV KCl x 4 Change MIVF to 0.9% saline w/ 40 mEq KCl/L at 100 mL/hr Recheck electrolytes with morning labs   Dallie Piles, PharmD, BCPS Clinical Pharmacist 04/19/2021 7:17 AM

## 2021-04-20 DIAGNOSIS — K5792 Diverticulitis of intestine, part unspecified, without perforation or abscess without bleeding: Secondary | ICD-10-CM | POA: Diagnosis not present

## 2021-04-20 LAB — COMPREHENSIVE METABOLIC PANEL
ALT: 13 U/L (ref 0–44)
AST: 21 U/L (ref 15–41)
Albumin: 2.5 g/dL — ABNORMAL LOW (ref 3.5–5.0)
Alkaline Phosphatase: 68 U/L (ref 38–126)
Anion gap: 9 (ref 5–15)
BUN: <5 mg/dL — ABNORMAL LOW (ref 8–23)
CO2: 25 mmol/L (ref 22–32)
Calcium: 8.1 mg/dL — ABNORMAL LOW (ref 8.9–10.3)
Chloride: 92 mmol/L — ABNORMAL LOW (ref 98–111)
Creatinine, Ser: 0.3 mg/dL — ABNORMAL LOW (ref 0.44–1.00)
GFR, Estimated: >60 mL/min (ref >60)
Glucose, Bld: 123 mg/dL — ABNORMAL HIGH (ref 70–99)
Potassium: 3.3 mmol/L — ABNORMAL LOW (ref 3.5–5.1)
Sodium: 126 mmol/L — ABNORMAL LOW (ref 135–145)
Total Bilirubin: 0.6 mg/dL (ref 0.3–1.2)
Total Protein: 5.4 g/dL — ABNORMAL LOW (ref 6.5–8.1)

## 2021-04-20 LAB — CULTURE, BLOOD (SINGLE)
Culture: NO GROWTH
Culture: NO GROWTH
Special Requests: ADEQUATE
Special Requests: ADEQUATE

## 2021-04-20 LAB — CBC WITH DIFFERENTIAL/PLATELET
Abs Immature Granulocytes: 0.36 10*3/uL — ABNORMAL HIGH (ref 0.00–0.07)
Basophils Absolute: 0.1 10*3/uL (ref 0.0–0.1)
Basophils Relative: 1 %
Eosinophils Absolute: 0.1 10*3/uL (ref 0.0–0.5)
Eosinophils Relative: 2 %
HCT: 30.8 % — ABNORMAL LOW (ref 36.0–46.0)
Hemoglobin: 10.4 g/dL — ABNORMAL LOW (ref 12.0–15.0)
Immature Granulocytes: 7 %
Lymphocytes Relative: 5 %
Lymphs Abs: 0.3 10*3/uL — ABNORMAL LOW (ref 0.7–4.0)
MCH: 29.1 pg (ref 26.0–34.0)
MCHC: 33.8 g/dL (ref 30.0–36.0)
MCV: 86 fL (ref 80.0–100.0)
Monocytes Absolute: 0.6 10*3/uL (ref 0.1–1.0)
Monocytes Relative: 11 %
Neutro Abs: 4.1 10*3/uL (ref 1.7–7.7)
Neutrophils Relative %: 74 %
Platelets: 322 10*3/uL (ref 150–400)
RBC: 3.58 MIL/uL — ABNORMAL LOW (ref 3.87–5.11)
RDW: 15.8 % — ABNORMAL HIGH (ref 11.5–15.5)
Smear Review: NORMAL
WBC: 5.4 10*3/uL (ref 4.0–10.5)
nRBC: 0 % (ref 0.0–0.2)

## 2021-04-20 LAB — GLUCOSE, CAPILLARY
Glucose-Capillary: 117 mg/dL — ABNORMAL HIGH (ref 70–99)
Glucose-Capillary: 126 mg/dL — ABNORMAL HIGH (ref 70–99)
Glucose-Capillary: 167 mg/dL — ABNORMAL HIGH (ref 70–99)
Glucose-Capillary: 172 mg/dL — ABNORMAL HIGH (ref 70–99)

## 2021-04-20 LAB — MAGNESIUM: Magnesium: 1.6 mg/dL — ABNORMAL LOW (ref 1.7–2.4)

## 2021-04-20 LAB — TRIGLYCERIDES: Triglycerides: 127 mg/dL (ref <150)

## 2021-04-20 LAB — PHOSPHORUS: Phosphorus: 2.6 mg/dL (ref 2.5–4.6)

## 2021-04-20 MED ORDER — POTASSIUM CHLORIDE 10 MEQ/50ML IV SOLN
10.0000 meq | INTRAVENOUS | Status: AC
  Administered 2021-04-20 (×2): 10 meq via INTRAVENOUS
  Filled 2021-04-20 (×2): qty 50

## 2021-04-20 MED ORDER — POTASSIUM CHLORIDE IN NACL 40-0.9 MEQ/L-% IV SOLN
INTRAVENOUS | Status: DC
  Filled 2021-04-20 (×4): qty 1000

## 2021-04-20 MED ORDER — TRAVASOL 10 % IV SOLN
INTRAVENOUS | Status: AC
  Filled 2021-04-20: qty 600

## 2021-04-20 MED ORDER — POTASSIUM CHLORIDE IN NACL 40-0.9 MEQ/L-% IV SOLN: INTRAVENOUS | Status: DC

## 2021-04-20 MED ORDER — MAGNESIUM SULFATE 2 GM/50ML IV SOLN
2.0000 g | Freq: Once | INTRAVENOUS | Status: AC
  Administered 2021-04-20: 2 g via INTRAVENOUS
  Filled 2021-04-20: qty 50

## 2021-04-20 NOTE — Progress Notes (Signed)
Mobility Specialist - Progress Note   04/20/21 1545  Mobility  Activity Ambulated in hall  Level of Assistance Standby assist, set-up cues, supervision of patient - no hands on  Assistive Device Front wheel walker  Distance Ambulated (ft) 350 ft  Mobility Ambulated with assistance in hallway  Mobility Response Tolerated well  Mobility performed by Mobility specialist  $Mobility charge 1 Mobility   During mobility: 126 HR,  97% SpO2 Post-mobility: 110 HR, 98% SpO2   Pt ambulated in hallway with supervision. Utilizing RA. No complaints. Less winded with activity today compared to last session. HR peaked at 130 bpm, no s/s of distress. Pt transferred to [facility] for urinary output prior to return to bed. Alarm set. RN notified.    Kathee Delton Mobility Specialist 04/20/21, 3:48 PM

## 2021-04-20 NOTE — Progress Notes (Signed)
Patient ID: Mary Washington, female   DOB: [birth year], 75 y.o.   MRN: [id number]     [place] Hospital Day(s): 5.   Interval History: Patient seen and examined, no acute events or new complaints overnight. Patient reports passing gas and had a small bowel movement. Denies abdominal pain. No fever.   Vital signs in last 24 hours: [min-max] current  Temp:  [98 F (36.7 C)-98.4 F (36.9 C)] 98 F (36.7 C) (11/19 0432) Pulse Rate:  [86-94] 94 (11/19 0432) Resp:  [16] 16 (11/19 0432) BP: (143-156)/(58-63) 143/63 (11/19 0432) SpO2:  [97 %-99 %] 97 % (11/19 0432) Weight:  [48.8 kg] 48.8 kg (11/19 0500)     Height: 5\' 2"  (157.5 cm) Weight: 48.8 kg BMI (Calculated): 19.67   Physical Exam:  Constitutional: alert, cooperative and no distress  Respiratory: breathing non-labored at rest  Cardiovascular: regular rate and sinus rhythm  Gastrointestinal: soft, non-tender. Distended and tympanic  Labs:  CBC Latest Ref Rng & Units 04/20/2021 04/19/2021 04/18/2021  WBC 4.0 - 10.5 K/uL 5.4 5.0 2.6(L)  Hemoglobin 12.0 - 15.0 g/dL 10.4(L) 9.6(L) 8.8(L)  Hematocrit 36.0 - 46.0 % 30.8(L) 29.1(L) 26.7(L)  Platelets 150 - 400 K/uL 322 262 227   CMP Latest Ref Rng & Units 04/20/2021 04/19/2021 04/19/2021  Glucose 70 - 99 mg/dL 123(H) 84 84  BUN 8 - 23 mg/dL <5(L) <5(L) <5(L)  Creatinine 0.44 - 1.00 mg/dL 0.30(L) 0.49 0.49  Sodium 135 - 145 mmol/L 126(L) 124(L) 126(L)  Potassium 3.5 - 5.1 mmol/L 3.3(L) 3.7 3.0(L)  Chloride 98 - 111 mmol/L 92(L) 92(L) 94(L)  CO2 22 - 32 mmol/L 25 22 22   Calcium 8.9 - 10.3 mg/dL 8.1(L) 8.3(L) 8.0(L)  Total Protein 6.5 - 8.1 g/dL 5.4(L) 5.7(L) -  Total Bilirubin 0.3 - 1.2 mg/dL 0.6 1.1 -  Alkaline Phos 38 - 126 U/L 68 73 -  AST 15 - 41 U/L 21 24 -  ALT 0 - 44 U/L 13 13 -    Imaging studies: No new pertinent imaging studies   Assessment/Plan:  75 y.o. female with complicated diverticulitis with pelvic abscess, complicated by pertinent comorbidities  including large B-cell lymphoma currently on chemotherapy, neutropenia (resolved), anemia.  Patient today endorses passing gas and had a small bowel movement. Still with distended abdomen and tympanic. Patient continue to improve WBC count. Still with hyponatremia and hypokalemia. Will continue optimization of electrolytes. Will continue with IV abx therapy, TPN, NGT to suction with ice chips. Encourage patient to ambulate.   [name], MD

## 2021-04-20 NOTE — Progress Notes (Addendum)
PHARMACY - TOTAL PARENTERAL NUTRITION CONSULT NOTE   Indication: Prolonged ileus  Patient Measurements: Height: 5\' 2"  (157.5 cm) Weight: 48.8 kg (107 lb 9.4 oz) IBW/kg (Calculated) : 50.1 TPN AdjBW (KG): 51.7 Body mass index is 19.68 kg/m.  Assessment: 75 y.o. 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  Glucose / Insulin: no h/o diabetes, no insulin requirements Electrolytes: hypokalemia, hypomagnesemia Renal: SCr<1, stable Hepatic: LFTs wnl at baseline Intake / Output; MIVF: 0.9% saline w/ 40 mEq KCl/L at 75 mL/hr GI Imaging: 11/17 CT Abd: Sigmoid diverticulitis GI Surgeries / Procedures: no recent surgical interventions  Central access: 11/23/20 TPN start date: 04/19/21  Nutritional Goals: Goal TPN rate is 75 mL/hr (provides 90 g of protein and 1650 kcals per day)  RD Assessment: Estimated Needs Total Energy Estimated Needs: 1500-1700kcal/day Total Protein Estimated Needs: 75-85g/day Total Fluid Estimated Needs: 1.3-1.5L/day  Current Nutrition: NPO  Plan:  Increase TPN to 50 mL/hr at 1800  Total volume including overfill 1300 mL Nutritional Components: Amino acids (using 10% Travasol): 60 grams  Dextrose: 156 grams Lipids (using 20% SMOFlipid): 33 grams Total kCal: 1100 / 24h Electrolytes in TPN: Na 75 mEq/L, K 50 mEq/L, Ca 59mEq/L, Mg 10 mEq/L, and Phos 10 mmol/L. Cl:Ac 2:1 Give magnesium 2 g + potassium 10 mEq IV x 2. Continue MIVF with potassium until TPN at goal Add standard MVI and trace elements to TPN Add thiamine 100 mg daily x 3 days Initiate Sensitive q6h SSI and adjust as needed - no requirements in last 24 hrs Continue MIVF at 75 mL/hr per discussion with MD Monitor TPN labs on Mon/Thurs, daily until stable  Tawnya Crook, PharmD, BCPS Clinical Pharmacist 04/20/2021 7:23 AM

## 2021-04-20 NOTE — Progress Notes (Signed)
[facility] [place] NAME: Vivika Poythress    MR#:  [id number]  DATE OF BIRTH:  09-06-1945  SUBJECTIVE:   Passing gas. Walks with Mobility therapist NG out today 250 cc so far REVIEW OF SYSTEMS:   Review of Systems  Constitutional:  Negative for chills, fever and weight loss.  HENT:  Negative for ear discharge, ear pain and nosebleeds.   Eyes:  Negative for blurred vision, pain and discharge.  Respiratory:  Negative for sputum production, shortness of breath, wheezing and stridor.   Cardiovascular:  Negative for chest pain, palpitations, orthopnea and PND.  Gastrointestinal:  Positive for abdominal pain and nausea. Negative for diarrhea and vomiting.  Genitourinary:  Negative for frequency and urgency.  Musculoskeletal:  Negative for back pain and joint pain.  Neurological:  Positive for weakness. Negative for sensory change, speech change and focal weakness.  Psychiatric/Behavioral:  Negative for depression and hallucinations. The patient is not nervous/anxious.   Tolerating Diet:NPO Tolerating PT: ambulating with therapy  DRUG ALLERGIES:  No Known Allergies  VITALS:  Blood pressure (!) 143/63, pulse 94, temperature 98 F (36.7 C), temperature source Oral, resp. rate 16, height 5\' 2"  (1.575 m), weight 48.8 kg, SpO2 97 %.  PHYSICAL EXAMINATION:   Physical Exam  GENERAL:  75 y.o.-year-old patient lying in the bed with no acute distress.  HEENT: Head atraumatic, normocephalic. Oropharynx sore  on left buccal mucosa NG+  LUNGS: Normal breath sounds bilaterally, no wheezing, rales, rhonchi. No use of accessory muscles of respiration.  CARDIOVASCULAR: S1, S2 normal. No murmurs, rubs, or gallops.  ABDOMEN: Soft, nontender, +distended.  EXTREMITIES: No cyanosis, clubbing or edema b/l.    NEUROLOGIC: nonfocal PSYCHIATRIC:  patient is alert and oriented x 3.  SKIN: No obvious rash, lesion, or ulcer.   LABORATORY PANEL:  CBC Recent Labs   Lab 04/20/21 0550  WBC 5.4  HGB 10.4*  HCT 30.8*  PLT 322     Chemistries  Recent Labs  Lab 04/20/21 0550  NA 126*  K 3.3*  CL 92*  CO2 25  GLUCOSE 123*  BUN <5*  CREATININE 0.30*  CALCIUM 8.1*  MG 1.6*  AST 21  ALT 13  ALKPHOS 68  BILITOT 0.6    Cardiac Enzymes No results for input(s): TROPONINI in the last 168 hours. RADIOLOGY:  [place] EKG SITE RITE  Result Date: 04/19/2021 If Site Rite image not attached, placement could not be confirmed due to current cardiac rhythm.  ASSESSMENT AND PLAN:  Desani Sprung is a 75 y.o. 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 Patient states that she gets chemotherapy every 3 weeks and her last treatment was 1 week ago on 11/07.  She states that following the treatment she developed constipation and had to take several days of MiraLAX without any significant improvement.  Acute diverticulitis with pelvic abscess small bowel obstruction/ileus -- continue IV Zosyn -- NPO -- NG tube per surgical recommendation -- IV antiemetics -- appreciate Dr. Windell Moment surgical input --11/15-- patient to get gastrographin and study today --11/16-- gastro graph and diet passes through the colon proximal. Patient had bowel movement. NG clamped. --11/17--CT abd repeated. NG back to suction, replacing K --11/18-- repeat CT shows ileus with some small bowel loops distended. Patient has lot related pelvic fluid collection/abscess. -- Discussed with Dr. Windell Moment abscesses  are not amenable for CT guided drain. Will continue conservative management over the weekend and if patient does not show improvement then she'll need exploratory laparotomy next week. -- Continues with high NG output --TPN started --11/19--passing gas, NG decreased to 2500 cc so far. Replace  electrolytes  Hyponatremia -- likely due to volume depletion for poor PO intake -- continue IV normal saline -- came in with sodium of 123--- IV fluids-- 130--126  Hypokalemia, Low magnesium -- likely due to G.I. loss -- pharmacy to replete IV  Pancytopenia suspected due to chemotherapy -- patient was seen by Dr. Rogue Bussing -- she recently completed chemo last week and received a dose of G CSF -- continue to monitor counts --- white count 0.5--0.5-- 1.5--2.6--5.0--5.4  history of diffuse large B-cell lymphoma -- undergoing chemotherapy with Dr. Rogue Bussing (last treatment two weeks)   Family communication :none today Consults : oncology, surgery CODE STATUS: full code DVT Prophylaxis : enoxaparin Level of care: Telemetry Medical Status is: Inpatient  Remains inpatient appropriate because: management for acute diverticulitis with pelvic abscess        TOTAL TIME TAKING CARE OF THIS PATIENT: 25 minutes.  >50% time spent on counselling and coordination of care  Note: This dictation was prepared with Dragon dictation along with smaller phrase technology. Any transcriptional errors that result from this process are unintentional.  Fritzi Mandes M.D    [facility]   CC: Primary care physician; [name], MD Patient ID: [name], female   DOB: 1945-09-06, 75 y.o.   MRN: [id number]

## 2021-04-21 DIAGNOSIS — K5792 Diverticulitis of intestine, part unspecified, without perforation or abscess without bleeding: Secondary | ICD-10-CM | POA: Diagnosis not present

## 2021-04-21 LAB — CBC WITH DIFFERENTIAL/PLATELET
Abs Immature Granulocytes: 0.59 10*3/uL — ABNORMAL HIGH (ref 0.00–0.07)
Basophils Absolute: 0.1 10*3/uL (ref 0.0–0.1)
Basophils Relative: 1 %
Eosinophils Absolute: 0.1 10*3/uL (ref 0.0–0.5)
Eosinophils Relative: 1 %
HCT: 31.9 % — ABNORMAL LOW (ref 36.0–46.0)
Hemoglobin: 10.7 g/dL — ABNORMAL LOW (ref 12.0–15.0)
Immature Granulocytes: 10 %
Lymphocytes Relative: 6 %
Lymphs Abs: 0.3 10*3/uL — ABNORMAL LOW (ref 0.7–4.0)
MCH: 28.8 pg (ref 26.0–34.0)
MCHC: 33.5 g/dL (ref 30.0–36.0)
MCV: 85.8 fL (ref 80.0–100.0)
Monocytes Absolute: 0.7 10*3/uL (ref 0.1–1.0)
Monocytes Relative: 12 %
Neutro Abs: 4 10*3/uL (ref 1.7–7.7)
Neutrophils Relative %: 70 %
Platelets: 381 10*3/uL (ref 150–400)
RBC: 3.72 MIL/uL — ABNORMAL LOW (ref 3.87–5.11)
RDW: 16 % — ABNORMAL HIGH (ref 11.5–15.5)
WBC: 5.8 10*3/uL (ref 4.0–10.5)
nRBC: 0 % (ref 0.0–0.2)

## 2021-04-21 LAB — GLUCOSE, CAPILLARY
Glucose-Capillary: 154 mg/dL — ABNORMAL HIGH (ref 70–99)
Glucose-Capillary: 148 mg/dL — ABNORMAL HIGH (ref 70–99)
Glucose-Capillary: 124 mg/dL — ABNORMAL HIGH (ref 70–99)

## 2021-04-21 LAB — BASIC METABOLIC PANEL
Anion gap: 5 (ref 5–15)
BUN: 10 mg/dL (ref 8–23)
CO2: 27 mmol/L (ref 22–32)
Calcium: 8.4 mg/dL — ABNORMAL LOW (ref 8.9–10.3)
Chloride: 99 mmol/L (ref 98–111)
Creatinine, Ser: 0.48 mg/dL (ref 0.44–1.00)
GFR, Estimated: >60 mL/min (ref >60)
Glucose, Bld: 155 mg/dL — ABNORMAL HIGH (ref 70–99)
Potassium: 3.9 mmol/L (ref 3.5–5.1)
Sodium: 131 mmol/L — ABNORMAL LOW (ref 135–145)

## 2021-04-21 LAB — MAGNESIUM: Magnesium: 2.1 mg/dL (ref 1.7–2.4)

## 2021-04-21 LAB — PHOSPHORUS: Phosphorus: 3.4 mg/dL (ref 2.5–4.6)

## 2021-04-21 MED ORDER — TRAVASOL 10 % IV SOLN
INTRAVENOUS | Status: AC
  Filled 2021-04-21: qty 900

## 2021-04-21 MED ORDER — TRAVASOL 10 % IV SOLN
INTRAVENOUS | Status: DC
  Filled 2021-04-21: qty 900

## 2021-04-21 MED ORDER — POTASSIUM ACETATE 2 MEQ/ML IV SOLN
INTRAVENOUS | Status: DC
Start: 2021-04-21 — End: 2021-04-21

## 2021-04-21 NOTE — Progress Notes (Signed)
PHARMACY - TOTAL PARENTERAL NUTRITION CONSULT NOTE   Indication: Prolonged ileus  Patient Measurements: Height: 5\' 2"  (157.5 cm) Weight: 48.8 kg (107 lb 9.4 oz) IBW/kg (Calculated) : 50.1 TPN AdjBW (KG): 51.7 Body mass index is 19.68 kg/m.  Assessment: 75 y.o. 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  Glucose / Insulin: on sensitive SSI Electrolytes: hypokalemia, hypomagnesemia Renal: SCr<1, stable Hepatic: LFTs wnl at baseline Intake / Output; MIVF: 0.9% saline w/ 40 mEq KCl/L at 50 mL/hr GI Imaging: 11/17 CT Abd: Sigmoid diverticulitis GI Surgeries / Procedures: no recent surgical interventions  Central access: 11/23/20 TPN start date: 04/19/21  Nutritional Goals: Goal TPN rate is 75 mL/hr (provides 90 g of protein and 1650 kcals per day)  RD Assessment: Estimated Needs Total Energy Estimated Needs: 1500-1700kcal/day Total Protein Estimated Needs: 75-85g/day Total Fluid Estimated Needs: 1.3-1.5L/day  Current Nutrition: NPO  Plan:  Increase TPN to 75 mL/hr at 1800  Total volume including overfill 1800 mL Nutritional Components: Amino acids (using 10% Travasol): 90 grams  Dextrose: 234 grams Lipids (using 20% SMOFlipid): 49.6 grams Total kCal: 1651 / 24h Electrolytes in TPN: Na 70 mEq/L, K 30 mEq/L, Ca 5 mEq/L, Mg 5 mEq/L, and Phos 5 mmol/L. Cl:Ac 1:1 Remains on MIVF with potassium - reduced to 50 ml/hr per MD Add standard MVI and trace elements to TPN Add thiamine 100 mg daily x 3 days (day 2) Continue Sensitive q6h SSI and adjust as needed - 24 hr requirement 7 units Monitor TPN labs on Mon/Thurs, daily until stable  Tawnya Crook, PharmD, BCPS Clinical Pharmacist 04/21/2021 10:39 AM

## 2021-04-21 NOTE — Progress Notes (Signed)
Patient ID: Mary Washington, female   DOB: [birth year], 75 y.o.   MRN: [id number]     [facility] Day(s): 6.   Interval History: Patient seen and examined, no acute events or new complaints overnight. Patient reports feeling well. She has not feel much gas and only small bowel movement. She does feels her intestine moving. She is asking to sit in a comode. Denies abdominal pain.   Vital signs in last 24 hours: [min-max] current  Temp:  [98 F (36.7 C)-98.9 F (37.2 C)] 98.1 F (36.7 C) (11/20 0750) Pulse Rate:  [91-100] 91 (11/20 0750) Resp:  [18] 18 (11/20 0750) BP: (135-139)/(62-67) 135/67 (11/20 0750) SpO2:  [96 %-98 %] 98 % (11/20 0750)     Height: 5\' 2"  (157.5 cm) Weight: 48.8 kg BMI (Calculated): 19.67   Physical Exam:  Constitutional: alert, cooperative and no distress  Respiratory: breathing non-labored at rest  Cardiovascular: regular rate and sinus rhythm  Gastrointestinal: soft, non-tender, and mild-distended  Labs:  CBC Latest Ref Rng & Units 04/21/2021 04/20/2021 04/19/2021  WBC 4.0 - 10.5 K/uL 5.8 5.4 5.0  Hemoglobin 12.0 - 15.0 g/dL 10.7(L) 10.4(L) 9.6(L)  Hematocrit 36.0 - 46.0 % 31.9(L) 30.8(L) 29.1(L)  Platelets 150 - 400 K/uL 381 322 262   CMP Latest Ref Rng & Units 04/21/2021 04/20/2021 04/19/2021  Glucose 70 - 99 mg/dL 155(H) 123(H) 84  BUN 8 - 23 mg/dL 10 <5(L) <5(L)  Creatinine 0.44 - 1.00 mg/dL 0.48 0.30(L) 0.49  Sodium 135 - 145 mmol/L 131(L) 126(L) 124(L)  Potassium 3.5 - 5.1 mmol/L 3.9 3.3(L) 3.7  Chloride 98 - 111 mmol/L 99 92(L) 92(L)  CO2 22 - 32 mmol/L 27 25 22   Calcium 8.9 - 10.3 mg/dL 8.4(L) 8.1(L) 8.3(L)  Total Protein 6.5 - 8.1 g/dL - 5.4(L) 5.7(L)  Total Bilirubin 0.3 - 1.2 mg/dL - 0.6 1.1  Alkaline Phos 38 - 126 U/L - 68 73  AST 15 - 41 U/L - 21 24  ALT 0 - 44 U/L - 13 13    Imaging studies: No new pertinent imaging studies   Assessment/Plan:  75 y.o. 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  No change in clinical status. Abdomen is a little bit softer but still with mild distention. WBC 5.8, Hemoglobin 10.7, Sodium 131 and Potassium 3.9, Magnesium 2.1.  NGT output: 800 with some ice chips  Will continue with IV abx therapy, TPN, NGT to suction, ambulation and keeping electrolytes within normal limits. Will get abdominal xray in morning to continue assessment of bowel dilation and possible need of surgery.  Patient agreed with plan.   [name], MD

## 2021-04-21 NOTE — Progress Notes (Signed)
Mary Washington at [place] NAME: Mary Washington    MR#:  [id number]  DATE OF BIRTH:  01/11/1946  SUBJECTIVE:   Passing gas. Walks with Mobility therapist Had a very small BM today NG+ with 800 cc (on 11/19) REVIEW OF SYSTEMS:   Review of Systems  Constitutional:  Negative for chills, fever and weight loss.  HENT:  Negative for ear discharge, ear pain and nosebleeds.   Eyes:  Negative for blurred vision, pain and discharge.  Respiratory:  Negative for sputum production, shortness of breath, wheezing and stridor.   Cardiovascular:  Negative for chest pain, palpitations, orthopnea and PND.  Gastrointestinal:  Positive for abdominal pain and nausea. Negative for diarrhea and vomiting.  Genitourinary:  Negative for frequency and urgency.  Musculoskeletal:  Negative for back pain and joint pain.  Neurological:  Positive for weakness. Negative for sensory change, speech change and focal weakness.  Psychiatric/Behavioral:  Negative for depression and hallucinations. The patient is not nervous/anxious.   Tolerating Diet:NPO Tolerating PT: ambulating with therapy  DRUG ALLERGIES:  No Known Allergies  VITALS:  Blood pressure 135/67, pulse 91, temperature 98.1 F (36.7 C), temperature source Oral, resp. rate 18, height 5\' 2"  (1.575 m), weight 48.8 kg, SpO2 98 %.  PHYSICAL EXAMINATION:   Physical Exam  GENERAL:  75 y.o.-year-old patient lying in the bed with no acute distress.  HEENT: Head atraumatic, normocephalic. Oropharynx sore  on left buccal mucosa NG+  LUNGS: Normal breath sounds bilaterally, no wheezing, rales, rhonchi. No use of accessory muscles of respiration.  CARDIOVASCULAR: S1, S2 normal. No murmurs, rubs, or gallops.  ABDOMEN: Soft, nontender, +distended.  EXTREMITIES: No cyanosis, clubbing or edema b/l.    NEUROLOGIC: nonfocal PSYCHIATRIC:  patient is alert and oriented x 3.  SKIN: No obvious rash, lesion, or ulcer.   LABORATORY  PANEL:  CBC Recent Labs  Lab 04/21/21 0555  WBC 5.8  HGB 10.7*  HCT 31.9*  PLT 381     Chemistries  Recent Labs  Lab 04/20/21 0550 04/21/21 0555  NA 126* 131*  K 3.3* 3.9  CL 92* 99  CO2 25 27  GLUCOSE 123* 155*  BUN <5* 10  CREATININE 0.30* 0.48  CALCIUM 8.1* 8.4*  MG 1.6* 2.1  AST 21  --   ALT 13  --   ALKPHOS 68  --   BILITOT 0.6  --     Cardiac Enzymes No results for input(s): TROPONINI in the last 168 hours. RADIOLOGY:  No results found. ASSESSMENT AND PLAN:  [name] is a 75 y.o. 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 Patient states that she gets chemotherapy every 3 weeks and her last treatment was 1 week ago on 11/07.  She states that following the treatment she developed constipation and had to take several days of MiraLAX without any significant improvement.  Acute diverticulitis with pelvic abscess small bowel obstruction/ileus -- continue IV Zosyn -- NPO -- NG tube per surgical recommendation -- IV antiemetics -- appreciate Dr. Windell Moment surgical input --11/15-- patient to get gastrographin and study today --11/16-- gastro graph and diet passes through the colon proximal. Patient had bowel movement. NG clamped. --11/17--CT abd repeated. NG back to suction, replacing K --11/18-- repeat CT shows ileus with some small bowel loops distended. Patient has lot related pelvic fluid collection/abscess. --  Discussed with Dr. Windell Moment abscesses are not amenable for CT guided drain. Will continue conservative management over the weekend and if patient does not show improvement then she'll need exploratory laparotomy next week. -- Continues with high NG output --TPN started --11/19--passing gas, NG decreased to 2500 cc so far. Replace electrolytes --11/20-- passing gas, had  small BM, electrolytes better  Hyponatremia -- likely due to volume depletion for poor PO intake -- continue IV normal saline -- came in with sodium of 123--- IV fluids-- 130--126--131  Hypokalemia, Low magnesium -- likely due to G.I. loss -- pharmacy to replete IV  Pancytopenia suspected due to chemotherapy -- patient was seen by Dr. Rogue Bussing -- she recently completed chemo last week and received a dose of G CSF -- continue to monitor counts --- white count 0.5--0.5-- 1.5--2.6--5.0--5.4--5.8  history of diffuse large B-cell lymphoma -- undergoing chemotherapy with Dr. Rogue Bussing (last treatment two weeks)   Family communication :none today Consults : oncology, surgery CODE STATUS: full code DVT Prophylaxis : enoxaparin Level of care: Telemetry Medical Status is: Inpatient  Remains inpatient appropriate because: management for acute diverticulitis with pelvic abscess        TOTAL TIME TAKING CARE OF THIS PATIENT: 25 minutes.  >50% time spent on counselling and coordination of care  Note: This dictation was prepared with Dragon dictation along with smaller phrase technology. Any transcriptional errors that result from this process are unintentional.  Fritzi Mandes M.D    [facility]   CC: Primary care physician; [name], MD Patient ID: Mary Washington, female   DOB: Apr 29, 1946, 75 y.o.   MRN: [id number]

## 2021-04-22 ENCOUNTER — Inpatient Hospital Stay: Payer: Medicare Other

## 2021-04-22 ENCOUNTER — Encounter
Admission: EM | Disposition: A | Discharge status: Home Health | Payer: Self-pay | Source: Home / Self Care | Attending: Internal Medicine

## 2021-04-22 DIAGNOSIS — K5792 Diverticulitis of intestine, part unspecified, without perforation or abscess without bleeding: Secondary | ICD-10-CM | POA: Diagnosis not present

## 2021-04-22 LAB — COMPREHENSIVE METABOLIC PANEL
ALT: 12 U/L (ref 0–44)
AST: 21 U/L (ref 15–41)
Albumin: 2.8 g/dL — ABNORMAL LOW (ref 3.5–5.0)
Alkaline Phosphatase: 67 U/L (ref 38–126)
Anion gap: 7 (ref 5–15)
BUN: 16 mg/dL (ref 8–23)
CO2: 26 mmol/L (ref 22–32)
Calcium: 8.7 mg/dL — ABNORMAL LOW (ref 8.9–10.3)
Chloride: 99 mmol/L (ref 98–111)
Creatinine, Ser: 0.42 mg/dL — ABNORMAL LOW (ref 0.44–1.00)
GFR, Estimated: >60 mL/min (ref >60)
Glucose, Bld: 128 mg/dL — ABNORMAL HIGH (ref 70–99)
Potassium: 3.9 mmol/L (ref 3.5–5.1)
Sodium: 132 mmol/L — ABNORMAL LOW (ref 135–145)
Total Bilirubin: 0.4 mg/dL (ref 0.3–1.2)
Total Protein: 5.5 g/dL — ABNORMAL LOW (ref 6.5–8.1)

## 2021-04-22 LAB — CBC WITH DIFFERENTIAL/PLATELET
Abs Immature Granulocytes: 0 10*3/uL (ref 0.00–0.07)
Basophils Absolute: 0.1 10*3/uL (ref 0.0–0.1)
Basophils Relative: 1 %
Eosinophils Absolute: 0.1 10*3/uL (ref 0.0–0.5)
Eosinophils Relative: 2 %
HCT: 32.5 % — ABNORMAL LOW (ref 36.0–46.0)
Hemoglobin: 10.7 g/dL — ABNORMAL LOW (ref 12.0–15.0)
Lymphocytes Relative: 9 %
Lymphs Abs: 0.6 10*3/uL — ABNORMAL LOW (ref 0.7–4.0)
MCH: 28.5 pg (ref 26.0–34.0)
MCHC: 32.9 g/dL (ref 30.0–36.0)
MCV: 86.7 fL (ref 80.0–100.0)
Monocytes Absolute: 1.2 10*3/uL — ABNORMAL HIGH (ref 0.1–1.0)
Monocytes Relative: 17 %
Neutro Abs: 4.9 10*3/uL (ref 1.7–7.7)
Neutrophils Relative %: 71 %
Platelets: 481 10*3/uL — ABNORMAL HIGH (ref 150–400)
RBC: 3.75 MIL/uL — ABNORMAL LOW (ref 3.87–5.11)
RDW: 16.3 % — ABNORMAL HIGH (ref 11.5–15.5)
WBC: 6.9 10*3/uL (ref 4.0–10.5)
nRBC: 0 % (ref 0.0–0.2)

## 2021-04-22 LAB — GLUCOSE, CAPILLARY
Glucose-Capillary: 115 mg/dL — ABNORMAL HIGH (ref 70–99)
Glucose-Capillary: 130 mg/dL — ABNORMAL HIGH (ref 70–99)
Glucose-Capillary: 132 mg/dL — ABNORMAL HIGH (ref 70–99)
Glucose-Capillary: 155 mg/dL — ABNORMAL HIGH (ref 70–99)

## 2021-04-22 LAB — MAGNESIUM: Magnesium: 2 mg/dL (ref 1.7–2.4)

## 2021-04-22 LAB — PHOSPHORUS: Phosphorus: 3.5 mg/dL (ref 2.5–4.6)

## 2021-04-22 LAB — TRIGLYCERIDES: Triglycerides: 114 mg/dL (ref <150)

## 2021-04-22 SURGERY — COLECTOMY, WITH COLOSTOMY CREATION
Anesthesia: Choice

## 2021-04-22 MED ORDER — TRAVASOL 10 % IV SOLN
INTRAVENOUS | Status: AC
  Filled 2021-04-22: qty 900

## 2021-04-22 NOTE — Progress Notes (Signed)
Patient walk with me about 1 and 1/1 circle around nursing station.  She had no c/o SOB or any other distress.

## 2021-04-22 NOTE — Progress Notes (Signed)
PHARMACY - TOTAL PARENTERAL NUTRITION CONSULT NOTE   Indication: Prolonged ileus  Patient Measurements: Height: 5\' 2"  (157.5 cm) Weight: 47.8 kg (105 lb 6.1 oz) IBW/kg (Calculated) : 50.1 TPN AdjBW (KG): 51.7 Body mass index is 19.27 kg/m.  Assessment: 75 y.o. 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  Glucose / Insulin: BG/24h 124 - 155 requiring 5 units SSI  --on sensitive SSI Electrolytes: WNL Renal: SCr<1, stable Hepatic: LFTs wnl, TG wnl  Intake / Output; MIVF: 0.9% saline w/ 40 mEq KCl/L at 50 mL/hr GI Imaging: 11/17 CT Abd: Sigmoid diverticulitis GI Surgeries / Procedures: no recent surgical interventions  Central access: 11/23/20 TPN start date: 04/19/21  Nutritional Goals: Goal TPN rate is 75 mL/hr (provides 90 g of protein and 1650 kcals per day)  RD Assessment: Estimated Needs Total Energy Estimated Needs: 1500-1700kcal/day Total Protein Estimated Needs: 75-85g/day Total Fluid Estimated Needs: 1.3-1.5L/day  Current Nutrition: NPO  Plan:  Continue TPN at 75 mL/hr at 1800  Total volume including overfill 1800 mL Nutritional Components: Amino acids (using 10% Travasol): 90 grams  Dextrose: 234 grams Lipids (using 20% SMOFlipid): 49.6 grams Total kCal: 1651 / 24h Electrolytes in TPN: Na 90 mEq/L, K 30 mEq/L, Ca 5 mEq/L, Mg 5 mEq/L, and Phos 5 mmol/L. Cl:Ac 1:1 MIVF stopped by MD Add standard MVI and trace elements to TPN Add thiamine 100 mg daily x 3 days (day 3) Continue Sensitive q6h SSI and adjust as needed  Monitor TPN labs on Mon/Thurs, daily until stable  Dallie Piles, PharmD, BCPS Clinical Pharmacist 04/22/2021 7:30 AM

## 2021-04-22 NOTE — Progress Notes (Signed)
Patient ID: Mary Washington, female   DOB: [birth year], 75 y.o.   MRN: [id number]     [facility] Day(s): 7.   Interval History: Patient seen and examined, no acute events or new complaints overnight. Patient reports she is passing gas and had a small bowel movement.  She denies abdominal pain.  Vital signs in last 24 hours: [min-max] current  Temp:  [97.5 F (36.4 C)-98.7 F (37.1 C)] 97.6 F (36.4 C) (11/21 0840) Pulse Rate:  [90-103] 103 (11/21 0840) Resp:  [16-18] 16 (11/21 0840) BP: (124-137)/(59-63) 124/62 (11/21 0840) SpO2:  [97 %-100 %] 100 % (11/21 0840) Weight:  [47.8 kg] 47.8 kg (11/21 0557)     Height: 5\' 2"  (157.5 cm) Weight: 47.8 kg BMI (Calculated): 19.27   Physical Exam:  Constitutional: alert, cooperative and no distress  Respiratory: breathing non-labored at rest  Cardiovascular: regular rate and sinus rhythm  Gastrointestinal: soft, non-tender, and mild-distended  Labs:  CBC Latest Ref Rng & Units 04/22/2021 04/21/2021 04/20/2021  WBC 4.0 - 10.5 K/uL 6.9 5.8 5.4  Hemoglobin 12.0 - 15.0 g/dL 10.7(L) 10.7(L) 10.4(L)  Hematocrit 36.0 - 46.0 % 32.5(L) 31.9(L) 30.8(L)  Platelets 150 - 400 K/uL 481(H) 381 322   CMP Latest Ref Rng & Units 04/22/2021 04/21/2021 04/20/2021  Glucose 70 - 99 mg/dL 128(H) 155(H) 123(H)  BUN 8 - 23 mg/dL 16 10 <5(L)  Creatinine 0.44 - 1.00 mg/dL 0.42(L) 0.48 0.30(L)  Sodium 135 - 145 mmol/L 132(L) 131(L) 126(L)  Potassium 3.5 - 5.1 mmol/L 3.9 3.9 3.3(L)  Chloride 98 - 111 mmol/L 99 99 92(L)  CO2 22 - 32 mmol/L 26 27 25   Calcium 8.9 - 10.3 mg/dL 8.7(L) 8.4(L) 8.1(L)  Total Protein 6.5 - 8.1 g/dL 5.5(L) - 5.4(L)  Total Bilirubin 0.3 - 1.2 mg/dL 0.4 - 0.6  Alkaline Phos 38 - 126 U/L 67 - 68  AST 15 - 41 U/L 21 - 21  ALT 0 - 44 U/L 12 - 13    Imaging studies: Abdominal x-ray today shows persistent small bowel dilation.  Contrast and large intestine.   Assessment/Plan:  75 y.o. 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  Patient today was sitting in the commode and she endorses passing gas.  She had a small bowel movement yesterday.  Even though the x-ray shows no significant improvement in small bowel dilation, clinically she has adequate bowel sounds and passing gas.  Discussed with patient the alternative of surgical management versus continued conservative management.  She endorses she prefers to avoid surgery if possible.  Alternative is to clamp NGT today to see how she does without dysfunction.  We will continue with IV antibiotic therapy, TPN, NGT clamped.  I will continue to follow her clinically.  I encouraged the patient to ambulate.  Husband and daughter present at bedside and agree with plan.  [name], MD

## 2021-04-22 NOTE — Progress Notes (Signed)
[facility] [place] NAME: Mary Washington    MR#:  [id number]  DATE OF BIRTH:  1946/03/17  SUBJECTIVE:   Passing gas. Walks with Mobility therapist. NG clamped Passing gas Husband in the room REVIEW OF SYSTEMS:   Review of Systems  Constitutional:  Negative for chills, fever and weight loss.  HENT:  Negative for ear discharge, ear pain and nosebleeds.   Eyes:  Negative for blurred vision, pain and discharge.  Respiratory:  Negative for sputum production, shortness of breath, wheezing and stridor.   Cardiovascular:  Negative for chest pain, palpitations, orthopnea and PND.  Gastrointestinal:  Positive for abdominal pain and nausea. Negative for diarrhea and vomiting.  Genitourinary:  Negative for frequency and urgency.  Musculoskeletal:  Negative for back pain and joint pain.  Neurological:  Positive for weakness. Negative for sensory change, speech change and focal weakness.  Psychiatric/Behavioral:  Negative for depression and hallucinations. The patient is not nervous/anxious.   Tolerating Diet:NPO Tolerating PT: ambulating with therapy  DRUG ALLERGIES:  No Known Allergies  VITALS:  Blood pressure 124/62, pulse (!) 103, temperature 97.6 F (36.4 C), temperature source Oral, resp. rate 16, height 5\' 2"  (1.575 m), weight 47.8 kg, SpO2 100 %.  PHYSICAL EXAMINATION:   Physical Exam  GENERAL:  75 y.o.-year-old patient lying in the bed with no acute distress.  HEENT: Head atraumatic, normocephalic. Oropharynx sore  on left buccal mucosa NG+  LUNGS: Normal breath sounds bilaterally, no wheezing, rales, rhonchi. No use of accessory muscles of respiration.  CARDIOVASCULAR: S1, S2 normal. No murmurs, rubs, or gallops.  ABDOMEN: Soft, nontender, +distended.  EXTREMITIES: No cyanosis, clubbing or edema b/l.    NEUROLOGIC: nonfocal PSYCHIATRIC:  patient is alert and oriented x 3.  SKIN: No obvious rash, lesion, or ulcer.   LABORATORY  PANEL:  CBC Recent Labs  Lab 04/22/21 0629  WBC 6.9  HGB 10.7*  HCT 32.5*  PLT 481*     Chemistries  Recent Labs  Lab 04/22/21 0629  NA 132*  K 3.9  CL 99  CO2 26  GLUCOSE 128*  BUN 16  CREATININE 0.42*  CALCIUM 8.7*  MG 2.0  AST 21  ALT 12  ALKPHOS 67  BILITOT 0.4    Cardiac Enzymes No results for input(s): TROPONINI in the last 168 hours. RADIOLOGY:  DG Abd 2 Views  Result Date: 04/22/2021 CLINICAL DATA:  Abdominal pain and bloating. History of acute diverticulitis. EXAM: ABDOMEN - 2 VIEW COMPARISON:  04/17/2021; 04/19/2021; CT abdomen pelvis-04/18/2021 FINDINGS: Redemonstrated marked gaseous distention of multiple loops of small bowel with index loop of small bowel in the right mid abdomen measuring 4.4 cm in diameter. Enteric contrast is again seen within the cecum, unchanged. There is a paucity of colonic gas, unchanged. No pneumoperitoneum, pneumatosis or portal venous gas. Enteric tube tip and side port projected the expected location of the gastric fundus. Punctate phleboliths overlie the right hemipelvis. Limited visualization of lower thorax demonstrates tip of central venous catheter overlying the superior aspect the right atrium. Minimal bibasilar opacities favored to represent atelectasis. Sequela of previous lower thoracic vertebral body cement augmentation can be completely evaluated. No definite acute osseous abnormalities. IMPRESSION: Findings worrisome for small bowel obstruction. Continued attention on follow-up is advised. Electronically Signed   By: Sandi Mariscal M.D.   On: 04/22/2021 08:02   ASSESSMENT AND PLAN:  Mary Washington is a 75 y.o. female with medical history significant for large B cell lymphoma  on chemotherapy, chronic low back pain history of diverticulosis who presents to the emergency room for evaluation of abdominal distention and bloating. Patient states that she gets chemotherapy every 3 weeks and her last treatment was 1 week ago on 11/07.   She states that following the treatment she developed constipation and had to take several days of MiraLAX without any significant improvement.  Acute diverticulitis with pelvic abscess small bowel obstruction/ileus -- continue IV Zosyn -- NPO -- NG tube per surgical recommendation -- IV antiemetics -- appreciate Dr. Windell Moment surgical input --11/15-- patient to get gastrographin and study today --11/16-- gastro graph and diet passes through the colon proximal. Patient had bowel movement. NG clamped. --11/17--CT abd repeated. NG back to suction, replacing K --11/18-- repeat CT shows ileus with some small bowel loops distended. Patient has lot related pelvic fluid collection/abscess. -- Discussed with Dr. Windell Moment abscesses are not amenable for CT guided drain. Will continue conservative management over the weekend and if patient does not show improvement then she'll need exploratory laparotomy next week. -- Continues with high NG output --TPN started --11/19--passing gas, NG decreased to 2500 cc so far. Replace electrolytes --11/20-- passing gas, had small BM, electrolytes better --11/21-- passing gas, KUB--Small Bowel obstruction. Pt /husband wants to wait for now. No surgey planned  Hyponatremia -- likely due to volume depletion for poor PO intake -- continue IV normal saline -- came in with sodium of 123--- IV fluids-- 130--126--131--132  Hypokalemia, Low magnesium -- likely due to G.I. loss -- pharmacy to replete --better  Pancytopenia suspected due to chemotherapy -- patient was seen by Dr. Rogue Bussing -- she recently completed chemo last week and received a dose of G CSF -- continue to monitor counts --- white count 0.5--0.5-- 1.5--2.6--5.0--5.4--5.8--6.9  history of diffuse large B-cell lymphoma -- undergoing chemotherapy with Dr. Rogue Bussing (last treatment two weeks)   Family communication :husband in the room Consults : oncology, surgery CODE STATUS: full  code DVT Prophylaxis : enoxaparin Level of care: Telemetry Medical Status is: Inpatient  Remains inpatient appropriate because: management for acute diverticulitis with pelvic abscess and SBO        TOTAL TIME TAKING CARE OF THIS PATIENT: 25 minutes.  >50% time spent on counselling and coordination of care  Note: This dictation was prepared with Dragon dictation along with smaller phrase technology. Any transcriptional errors that result from this process are unintentional.  Fritzi Mandes M.D    [facility]   CC: Primary care physician; [name], MD Patient ID: [name], female   DOB: [birth year], 75 y.o.   MRN: [id number]

## 2021-04-22 NOTE — Progress Notes (Signed)
Patient ID: Mary Washington, female   DOB: 1945-07-28, 75 y.o.   MRN: [id number] SURGERY FOLLOW UP NOTE  Patient was reevaluated this afternoon.  She endorses that she felt pressure on the epigastric area.  She denies any nausea.  Nurse put her back to suction.  She endorses that she feels passing small amount of gas multiple times per day.  Vitals:   04/22/21 0840 04/22/21 1538  BP: 124/62 131/65  Pulse: (!) 103 95  Resp: 16 16  Temp: 97.6 F (36.4 C) 98.6 F (37 C)  SpO2: 100% 99%   Abdomen: Soft and depressible, mildly distended.  No pain.  A/P:  Patient endorses feeling pressure on the epigastric area.  She was placed back to suction.  She denies nausea.  Unsure if blood pressure is because of progressive ileus.  I will keep her with NGT to suction overnight but will try again in the morning to clamp the NG and assess for toleration.  If she does not tolerate NGT clamping we will discuss again the need for surgery.  This follow-up encounter was more than 30-minute most of the time evaluating the patient, counseling and coordinating plan of care.  Herbert Pun, MD, FACS

## 2021-04-22 NOTE — Care Management Important Message (Signed)
Important Message  Patient Details  Name: Mary Washington MRN: [id number] Date of Birth: 08/25/1945   Medicare Important Message Given:  Yes     Dannette Barbara 04/22/2021, 5:05 PM

## 2021-04-23 ENCOUNTER — Inpatient Hospital Stay: Payer: Medicare Other

## 2021-04-23 DIAGNOSIS — K5792 Diverticulitis of intestine, part unspecified, without perforation or abscess without bleeding: Secondary | ICD-10-CM | POA: Diagnosis not present

## 2021-04-23 LAB — CBC WITH DIFFERENTIAL/PLATELET
Abs Immature Granulocytes: 0.61 10*3/uL — ABNORMAL HIGH (ref 0.00–0.07)
Basophils Absolute: 0.1 10*3/uL (ref 0.0–0.1)
Basophils Relative: 1 %
Eosinophils Absolute: 0.1 10*3/uL (ref 0.0–0.5)
Eosinophils Relative: 1 %
HCT: 33.3 % — ABNORMAL LOW (ref 36.0–46.0)
Hemoglobin: 10.8 g/dL — ABNORMAL LOW (ref 12.0–15.0)
Immature Granulocytes: 8 %
Lymphocytes Relative: 6 %
Lymphs Abs: 0.5 10*3/uL — ABNORMAL LOW (ref 0.7–4.0)
MCH: 28.3 pg (ref 26.0–34.0)
MCHC: 32.4 g/dL (ref 30.0–36.0)
MCV: 87.4 fL (ref 80.0–100.0)
Monocytes Absolute: 1.1 10*3/uL — ABNORMAL HIGH (ref 0.1–1.0)
Monocytes Relative: 13 %
Neutro Abs: 5.7 10*3/uL (ref 1.7–7.7)
Neutrophils Relative %: 71 %
Platelets: 521 10*3/uL — ABNORMAL HIGH (ref 150–400)
RBC: 3.81 MIL/uL — ABNORMAL LOW (ref 3.87–5.11)
RDW: 16.5 % — ABNORMAL HIGH (ref 11.5–15.5)
WBC: 8 10*3/uL (ref 4.0–10.5)
nRBC: 0 % (ref 0.0–0.2)

## 2021-04-23 LAB — GLUCOSE, CAPILLARY
Glucose-Capillary: 104 mg/dL — ABNORMAL HIGH (ref 70–99)
Glucose-Capillary: 131 mg/dL — ABNORMAL HIGH (ref 70–99)
Glucose-Capillary: 132 mg/dL — ABNORMAL HIGH (ref 70–99)
Glucose-Capillary: 159 mg/dL — ABNORMAL HIGH (ref 70–99)

## 2021-04-23 LAB — BASIC METABOLIC PANEL
Anion gap: 8 (ref 5–15)
BUN: 17 mg/dL (ref 8–23)
CO2: 25 mmol/L (ref 22–32)
Calcium: 8.6 mg/dL — ABNORMAL LOW (ref 8.9–10.3)
Chloride: 97 mmol/L — ABNORMAL LOW (ref 98–111)
Creatinine, Ser: 0.46 mg/dL (ref 0.44–1.00)
GFR, Estimated: >60 mL/min (ref >60)
Glucose, Bld: 141 mg/dL — ABNORMAL HIGH (ref 70–99)
Potassium: 3.5 mmol/L (ref 3.5–5.1)
Sodium: 130 mmol/L — ABNORMAL LOW (ref 135–145)

## 2021-04-23 MED ORDER — TRAVASOL 10 % IV SOLN
INTRAVENOUS | Status: AC
  Filled 2021-04-23: qty 900

## 2021-04-23 MED ORDER — POTASSIUM CHLORIDE 10 MEQ/100ML IV SOLN
10.0000 meq | INTRAVENOUS | Status: AC
  Administered 2021-04-23 (×2): 10 meq via INTRAVENOUS
  Filled 2021-04-23 (×2): qty 100

## 2021-04-23 NOTE — Progress Notes (Signed)
[facility] NAME: Gali Spinney    MR#:  [id number]  DATE OF BIRTH:  [birth year]  SUBJECTIVE:   Passing gas. Walks with Mobility therapist. NG  hooked up again with significant output Husband in the room REVIEW OF SYSTEMS:   Review of Systems  Constitutional:  Negative for chills, fever and weight loss.  HENT:  Negative for ear discharge, ear pain and nosebleeds.   Eyes:  Negative for blurred vision, pain and discharge.  Respiratory:  Negative for sputum production, shortness of breath, wheezing and stridor.   Cardiovascular:  Negative for chest pain, palpitations, orthopnea and PND.  Gastrointestinal:  Positive for abdominal pain and nausea. Negative for diarrhea and vomiting.  Genitourinary:  Negative for frequency and urgency.  Musculoskeletal:  Negative for back pain and joint pain.  Neurological:  Positive for weakness. Negative for sensory change, speech change and focal weakness.  Psychiatric/Behavioral:  Negative for depression and hallucinations. The patient is not nervous/anxious.   Tolerating Diet:NPO Tolerating PT: ambulating with therapy  DRUG ALLERGIES:  No Known Allergies  VITALS:  Blood pressure (!) 140/58, pulse (!) 105, temperature 98.4 F (36.9 C), temperature source Oral, resp. rate 18, height 5\' 2"  (1.575 m), weight 47.5 kg, SpO2 97 %.  PHYSICAL EXAMINATION:   Physical Exam  GENERAL:  74 y.o.-year-old patient lying in the bed with no acute distress.  HEENT: Head atraumatic, normocephalic. Oropharynx sore  on left buccal mucosa NG+  LUNGS: Normal breath sounds bilaterally, no wheezing, rales, rhonchi. No use of accessory muscles of respiration.  CARDIOVASCULAR: S1, S2 normal. No murmurs, rubs, or gallops.  ABDOMEN: Soft, nontender, +distended.  EXTREMITIES: No cyanosis, clubbing or edema b/l.    NEUROLOGIC: nonfocal PSYCHIATRIC:  patient is alert and oriented x 3.  SKIN: No obvious rash, lesion, or  ulcer.   LABORATORY PANEL:  CBC Recent Labs  Lab 04/23/21 0500  WBC 8.0  HGB 10.8*  HCT 33.3*  PLT 521*     Chemistries  Recent Labs  Lab 04/22/21 0629 04/23/21 0500  NA 132* 130*  K 3.9 3.5  CL 99 97*  CO2 26 25  GLUCOSE 128* 141*  BUN 16 17  CREATININE 0.42* 0.46  CALCIUM 8.7* 8.6*  MG 2.0  --   AST 21  --   ALT 12  --   ALKPHOS 67  --   BILITOT 0.4  --     Cardiac Enzymes No results for input(s): TROPONINI in the last 168 hours. RADIOLOGY:  DG Abd 2 Views  Result Date: 04/22/2021 CLINICAL DATA:  Abdominal pain and bloating. History of acute diverticulitis. EXAM: ABDOMEN - 2 VIEW COMPARISON:  04/17/2021; 04/19/2021; CT abdomen pelvis-04/18/2021 FINDINGS: Redemonstrated marked gaseous distention of multiple loops of small bowel with index loop of small bowel in the right mid abdomen measuring 4.4 cm in diameter. Enteric contrast is again seen within the cecum, unchanged. There is a paucity of colonic gas, unchanged. No pneumoperitoneum, pneumatosis or portal venous gas. Enteric tube tip and side port projected the expected location of the gastric fundus. Punctate phleboliths overlie the right hemipelvis. Limited visualization of lower thorax demonstrates tip of central venous catheter overlying the superior aspect the right atrium. Minimal bibasilar opacities favored to represent atelectasis. Sequela of previous lower thoracic vertebral body cement augmentation can be completely evaluated. No definite acute osseous abnormalities. IMPRESSION: Findings worrisome for small bowel obstruction. Continued attention on follow-up is advised. Electronically Signed   By: Jenny Reichmann  Watts M.D.   On: 04/22/2021 08:02   ASSESSMENT AND PLAN:  Alondra Vandeven is a 75 y.o. 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 Patient  states that she gets chemotherapy every 3 weeks and her last treatment was 1 week ago on 11/07.  She states that following the treatment she developed constipation and had to take several days of MiraLAX without any significant improvement.  Acute diverticulitis with pelvic abscess small bowel obstruction/ileus -- continue IV Zosyn -- NPO -- NG tube per surgical recommendation -- IV antiemetics -- appreciate Dr. Windell Moment surgical input --11/15-- patient to get gastrographin and study today --11/16-- gastro graph and diet passes through the colon proximal. Patient had bowel movement. NG clamped. --11/17--CT abd repeated. NG back to suction, replacing K --11/18-- repeat CT shows ileus with some small bowel loops distended. Patient has lot related pelvic fluid collection/abscess. -- Discussed with Dr. Windell Moment abscesses are not amenable for CT guided drain. Will continue conservative management over the weekend and if patient does not show improvement then she'll need exploratory laparotomy next week. -- Continues with high NG output --TPN started --11/19--passing gas, NG decreased to 2500 cc so far. Replace electrolytes --11/20-- passing gas, had small BM, electrolytes better --11/21-- passing gas, KUB--Small Bowel obstruction. Pt /husband wants to wait for now. No surgey planned --11/22-- no improvement yet despite conservative mnx. Surgery planned for 11/23  Hyponatremia -- likely due to volume depletion for poor PO intake -- continue IV normal saline -- came in with sodium of 123--- IV fluids-- 130--126--131--132  Hypokalemia, Low magnesium -- likely due to G.I. loss -- pharmacy to replete  Pancytopenia suspected due to chemotherapy -- patient was seen by Dr. Rogue Bussing -- she recently completed chemo last week and received a dose of G CSF -- continue to monitor counts --- white count 0.5--0.5-- 1.5--2.6--5.0--5.4--5.8--6.9--8.0  history of diffuse large B-cell  lymphoma -- undergoing chemotherapy with Dr. Rogue Bussing (last treatment two weeks)   Family communication :husband in the room Consults : oncology, surgery CODE STATUS: full code DVT Prophylaxis : enoxaparin Level of care: Telemetry Medical Status is: Inpatient  Remains inpatient appropriate because: management for acute diverticulitis with pelvic abscess and SBO        TOTAL TIME TAKING CARE OF THIS PATIENT: 25 minutes.  >50% time spent on counselling and coordination of care  Note: This dictation was prepared with Dragon dictation along with smaller phrase technology. Any transcriptional errors that result from this process are unintentional.  Fritzi Mandes M.D    [facility]   CC: Primary care physician; [name], MD Patient ID: [name], female   DOB: [birth year], 75 y.o.   MRN: [id number]

## 2021-04-23 NOTE — Progress Notes (Signed)
Patient ambulated the central and sub nursing station twice with this nurse.

## 2021-04-23 NOTE — Progress Notes (Signed)
PHARMACY - TOTAL PARENTERAL NUTRITION CONSULT NOTE   Indication: Prolonged ileus  Patient Measurements: Height: 5\' 2"  (157.5 cm) Weight: 47.5 kg (104 lb 11.5 oz) IBW/kg (Calculated) : 50.1 TPN AdjBW (KG): 51.7 Body mass index is 19.15 kg/m.  Assessment: 75 y.o. 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  Glucose / Insulin: BG/24h 115 - 141 requiring 5 units SSI  --on sensitive SSI Electrolytes: WNL Renal: SCr<1, stable Hepatic: LFTs wnl, TG wnl  Intake / Output; MIVF: no MIVF GI Imaging: 11/17 CT Abd: Sigmoid diverticulitis GI Surgeries / Procedures: no recent surgical interventions  Central access: 11/23/20 TPN start date: 04/19/21  Nutritional Goals: Goal TPN rate is 75 mL/hr (provides 90 g of protein and 1650 kcals per day)  RD Assessment: Estimated Needs Total Energy Estimated Needs: 1500-1700kcal/day Total Protein Estimated Needs: 75-85g/day Total Fluid Estimated Needs: 1.3-1.5L/day  Current Nutrition: NPO  Plan:  Continue TPN at 75 mL/hr at 1800  Total volume including overfill 1800 mL Nutritional Components: Amino acids (using 10% Travasol): 90 grams  Dextrose: 234 grams Lipids (using 20% SMOFlipid): 49.6 grams Total kCal: 1651 / 24h Electrolytes in TPN: Na 120 mEq/L, K 50 mEq/L, Ca 5 mEq/L, Mg 5 mEq/L, and Phos 5 mmol/L. Cl:Ac 1:1 MIVF stopped by MD Add standard MVI and trace elements to TPN 10 mEq IV KCl x 2 Continue Sensitive q6h SSI and adjust as needed  Monitor TPN labs on Mon/Thurs, daily until stable  Dallie Piles, PharmD, BCPS Clinical Pharmacist 04/23/2021 7:29 AM

## 2021-04-23 NOTE — Progress Notes (Signed)
Patient ambulated the central nursing station twice with this nurse. No distress is noted. Upon sitting, patient began to dispell gracious amounts of gas by burping. No bowel movement.

## 2021-04-23 NOTE — Progress Notes (Signed)
Patient ID: Mary Washington, female   DOB: 24-Nov-1945, 75 y.o.   MRN: [id number]     [facility] Day(s): 8.   Interval History: Patient seen and examined, no acute events or new complaints overnight. Patient reports not feeling better. No abdominal pain but feels bloated.  Vital signs in last 24 hours: [min-max] current  Temp:  [98.2 F (36.8 C)-98.6 F (37 C)] 98.4 F (36.9 C) (11/22 0530) Pulse Rate:  [95-105] 105 (11/22 0530) Resp:  [16-18] 18 (11/21 2131) BP: (131-149)/(58-65) 140/58 (11/22 0530) SpO2:  [97 %-99 %] 97 % (11/22 0530) Weight:  [47.5 kg] 47.5 kg (11/22 0500)     Height: 5\' 2"  (157.5 cm) Weight: 47.5 kg BMI (Calculated): 19.15   Physical Exam:  Constitutional: alert, cooperative and no distress  Respiratory: breathing non-labored at rest  Cardiovascular: regular rate and sinus rhythm  Gastrointestinal: soft, non-tender, and distended  Labs:  CBC Latest Ref Rng & Units 04/23/2021 04/22/2021 04/21/2021  WBC 4.0 - 10.5 K/uL 8.0 6.9 5.8  Hemoglobin 12.0 - 15.0 g/dL 10.8(L) 10.7(L) 10.7(L)  Hematocrit 36.0 - 46.0 % 33.3(L) 32.5(L) 31.9(L)  Platelets 150 - 400 K/uL 521(H) 481(H) 381   CMP Latest Ref Rng & Units 04/23/2021 04/22/2021 04/21/2021  Glucose 70 - 99 mg/dL 141(H) 128(H) 155(H)  BUN 8 - 23 mg/dL 17 16 10   Creatinine 0.44 - 1.00 mg/dL 0.46 0.42(L) 0.48  Sodium 135 - 145 mmol/L 130(L) 132(L) 131(L)  Potassium 3.5 - 5.1 mmol/L 3.5 3.9 3.9  Chloride 98 - 111 mmol/L 97(L) 99 99  CO2 22 - 32 mmol/L 25 26 27   Calcium 8.9 - 10.3 mg/dL 8.6(L) 8.7(L) 8.4(L)  Total Protein 6.5 - 8.1 g/dL - 5.5(L) -  Total Bilirubin 0.3 - 1.2 mg/dL - 0.4 -  Alkaline Phos 38 - 126 U/L - 67 -  AST 15 - 41 U/L - 21 -  ALT 0 - 44 U/L - 12 -    Imaging studies: No new pertinent imaging studies   Assessment/Plan:  75 y.o. female with complicated diverticulitis with pelvic abscess, complicated by pertinent comorbidities including large B-cell lymphoma currently on  chemotherapy, neutropenia (resolved), anemia.  Patient continues to feel bloated.  She endorses that she has been burping more than usual.  She does feel comfortable to give another trial of clamping the NGT.  Due to not improving after adequate medical management and optimization of her white blood cell count, hemoglobin, platelets and electrolytes, I discussed with the patient and her husband to proceed with surgical management.  I discussed with the patient the surgical management of partial colectomy with end colostomy creation.  I discussed with patient the goals of the surgery.  I also discussed with the patient the risk of the surgery that includes bleeding, infection, prolonged postoperative ileus, intra-abdominal abscess, enterocutaneous fistula, injury to adjacent organ, pain, among others.  The patient and the family understood and agreed to proceed.  We will schedule her tomorrow for partial colectomy and end colostomy creation.  Ostomy nurses will evaluate patient for preoperative markings.  [name], MD

## 2021-04-23 NOTE — Consult Note (Signed)
[facility] Nurse requested for preoperative stoma site marking  Discussed surgical procedure and stoma creation with patient and family.  Explained role of the [facility] nurse team.  Provided the patient with educational booklet and provided samples of pouching options.    Examined patient lying, sitting in order to place the marking in the patient's visual field, away from any creases or abdominal contour issues and within the rectus muscle.  Attempted to mark below the patient's belt line.   Marked for colostomy in the LLQ  __4__ cm to the left of the umbilicus and _3.3___IR below the umbilicus.  Patient's abdomen cleansed with CHG wipes at site markings, allowed to air dry prior to marking.Covered mark with thin film transparent dressing to preserve mark until date of surgery.   [facility] Nurse team will follow up with patient after surgery for continue ostomy care and teaching.  [place] MSN, [place], [place], [place]

## 2021-04-23 NOTE — H&P (View-Only) (Signed)
Patient ID: Shera Laubach, female   DOB: [birth year], 75 y.o.   MRN: [id number]     [facility] Day(s): 8.   Interval History: Patient seen and examined, no acute events or new complaints overnight. Patient reports not feeling better. No abdominal pain but feels bloated.  Vital signs in last 24 hours: [min-max] current  Temp:  [98.2 F (36.8 C)-98.6 F (37 C)] 98.4 F (36.9 C) (11/22 0530) Pulse Rate:  [95-105] 105 (11/22 0530) Resp:  [16-18] 18 (11/21 2131) BP: (131-149)/(58-65) 140/58 (11/22 0530) SpO2:  [97 %-99 %] 97 % (11/22 0530) Weight:  [47.5 kg] 47.5 kg (11/22 0500)     Height: 5\' 2"  (157.5 cm) Weight: 47.5 kg BMI (Calculated): 19.15   Physical Exam:  Constitutional: alert, cooperative and no distress  Respiratory: breathing non-labored at rest  Cardiovascular: regular rate and sinus rhythm  Gastrointestinal: soft, non-tender, and distended  Labs:  CBC Latest Ref Rng & Units 04/23/2021 04/22/2021 04/21/2021  WBC 4.0 - 10.5 K/uL 8.0 6.9 5.8  Hemoglobin 12.0 - 15.0 g/dL 10.8(L) 10.7(L) 10.7(L)  Hematocrit 36.0 - 46.0 % 33.3(L) 32.5(L) 31.9(L)  Platelets 150 - 400 K/uL 521(H) 481(H) 381   CMP Latest Ref Rng & Units 04/23/2021 04/22/2021 04/21/2021  Glucose 70 - 99 mg/dL 141(H) 128(H) 155(H)  BUN 8 - 23 mg/dL 17 16 10   Creatinine 0.44 - 1.00 mg/dL 0.46 0.42(L) 0.48  Sodium 135 - 145 mmol/L 130(L) 132(L) 131(L)  Potassium 3.5 - 5.1 mmol/L 3.5 3.9 3.9  Chloride 98 - 111 mmol/L 97(L) 99 99  CO2 22 - 32 mmol/L 25 26 27   Calcium 8.9 - 10.3 mg/dL 8.6(L) 8.7(L) 8.4(L)  Total Protein 6.5 - 8.1 g/dL - 5.5(L) -  Total Bilirubin 0.3 - 1.2 mg/dL - 0.4 -  Alkaline Phos 38 - 126 U/L - 67 -  AST 15 - 41 U/L - 21 -  ALT 0 - 44 U/L - 12 -    Imaging studies: No new pertinent imaging studies   Assessment/Plan:  74 y.o. female with complicated diverticulitis with pelvic abscess, complicated by pertinent comorbidities including large B-cell lymphoma currently on  chemotherapy, neutropenia (resolved), anemia.  Patient continues to feel bloated.  She endorses that she has been burping more than usual.  She does feel comfortable to give another trial of clamping the NGT.  Due to not improving after adequate medical management and optimization of her white blood cell count, hemoglobin, platelets and electrolytes, I discussed with the patient and her husband to proceed with surgical management.  I discussed with the patient the surgical management of partial colectomy with end colostomy creation.  I discussed with patient the goals of the surgery.  I also discussed with the patient the risk of the surgery that includes bleeding, infection, prolonged postoperative ileus, intra-abdominal abscess, enterocutaneous fistula, injury to adjacent organ, pain, among others.  The patient and the family understood and agreed to proceed.  We will schedule her tomorrow for partial colectomy and end colostomy creation.  Ostomy nurses will evaluate patient for preoperative markings.  [name], MD

## 2021-04-23 NOTE — Progress Notes (Signed)
The patient would like to ask Dr.Cintron Ferrel Logan a few more questions before signing consent for surgery.

## 2021-04-24 ENCOUNTER — Inpatient Hospital Stay: Payer: Medicare Other | Admitting: Anesthesiology

## 2021-04-24 ENCOUNTER — Encounter: Payer: Self-pay | Admitting: Internal Medicine

## 2021-04-24 ENCOUNTER — Encounter
Admission: EM | Disposition: A | Discharge status: Home Health | Payer: Self-pay | Source: Home / Self Care | Attending: Internal Medicine

## 2021-04-24 DIAGNOSIS — K56609 Unspecified intestinal obstruction, unspecified as to partial versus complete obstruction: Secondary | ICD-10-CM

## 2021-04-24 DIAGNOSIS — K5792 Diverticulitis of intestine, part unspecified, without perforation or abscess without bleeding: Secondary | ICD-10-CM | POA: Diagnosis not present

## 2021-04-24 DIAGNOSIS — C8581 Other specified types of non-Hodgkin lymphoma, lymph nodes of head, face, and neck: Secondary | ICD-10-CM | POA: Diagnosis not present

## 2021-04-24 HISTORY — PX: PARTIAL COLECTOMY: SHX5273

## 2021-04-24 LAB — CBC WITH DIFFERENTIAL/PLATELET
Abs Immature Granulocytes: 0.53 10*3/uL — ABNORMAL HIGH (ref 0.00–0.07)
Basophils Absolute: 0.1 10*3/uL (ref 0.0–0.1)
Basophils Relative: 1 %
Eosinophils Absolute: 0.1 10*3/uL (ref 0.0–0.5)
Eosinophils Relative: 1 %
HCT: 32.8 % — ABNORMAL LOW (ref 36.0–46.0)
Hemoglobin: 10.7 g/dL — ABNORMAL LOW (ref 12.0–15.0)
Immature Granulocytes: 6 %
Lymphocytes Relative: 7 %
Lymphs Abs: 0.6 10*3/uL — ABNORMAL LOW (ref 0.7–4.0)
MCH: 28.5 pg (ref 26.0–34.0)
MCHC: 32.6 g/dL (ref 30.0–36.0)
MCV: 87.5 fL (ref 80.0–100.0)
Monocytes Absolute: 1.5 10*3/uL — ABNORMAL HIGH (ref 0.1–1.0)
Monocytes Relative: 16 %
Neutro Abs: 6.4 10*3/uL (ref 1.7–7.7)
Neutrophils Relative %: 69 %
Platelets: 577 10*3/uL — ABNORMAL HIGH (ref 150–400)
RBC: 3.75 MIL/uL — ABNORMAL LOW (ref 3.87–5.11)
RDW: 16.6 % — ABNORMAL HIGH (ref 11.5–15.5)
Smear Review: NORMAL
WBC: 9.2 10*3/uL (ref 4.0–10.5)
nRBC: 0 % (ref 0.0–0.2)

## 2021-04-24 LAB — TYPE AND SCREEN
ABO/RH(D): O POS
Antibody Screen: NEGATIVE

## 2021-04-24 LAB — GLUCOSE, CAPILLARY
Glucose-Capillary: 129 mg/dL — ABNORMAL HIGH (ref 70–99)
Glucose-Capillary: 143 mg/dL — ABNORMAL HIGH (ref 70–99)
Glucose-Capillary: 207 mg/dL — ABNORMAL HIGH (ref 70–99)
Glucose-Capillary: 233 mg/dL — ABNORMAL HIGH (ref 70–99)

## 2021-04-24 LAB — BASIC METABOLIC PANEL
Anion gap: 6 (ref 5–15)
BUN: 19 mg/dL (ref 8–23)
CO2: 27 mmol/L (ref 22–32)
Calcium: 8.8 mg/dL — ABNORMAL LOW (ref 8.9–10.3)
Chloride: 102 mmol/L (ref 98–111)
Creatinine, Ser: 0.48 mg/dL (ref 0.44–1.00)
GFR, Estimated: >60 mL/min (ref >60)
Glucose, Bld: 122 mg/dL — ABNORMAL HIGH (ref 70–99)
Potassium: 3.7 mmol/L (ref 3.5–5.1)
Sodium: 135 mmol/L (ref 135–145)

## 2021-04-24 LAB — ABO/RH: ABO/RH(D): O POS

## 2021-04-24 LAB — MAGNESIUM: Magnesium: 2.1 mg/dL (ref 1.7–2.4)

## 2021-04-24 SURGERY — COLECTOMY, PARTIAL
Anesthesia: General

## 2021-04-24 MED ORDER — SODIUM CHLORIDE (PF) 0.9 % IJ SOLN: INTRAMUSCULAR | Status: DC | PRN

## 2021-04-24 MED ORDER — DEXAMETHASONE SODIUM PHOSPHATE 10 MG/ML IJ SOLN
INTRAMUSCULAR | Status: DC | PRN
  Administered 2021-04-24: 10 mg via INTRAVENOUS

## 2021-04-24 MED ORDER — ROCURONIUM BROMIDE 10 MG/ML (PF) SYRINGE
PREFILLED_SYRINGE | INTRAVENOUS | Status: AC
  Filled 2021-04-24: qty 10

## 2021-04-24 MED ORDER — ROCURONIUM BROMIDE 100 MG/10ML IV SOLN
INTRAVENOUS | Status: DC | PRN
  Administered 2021-04-24: 30 mg via INTRAVENOUS
  Administered 2021-04-24: 40 mg via INTRAVENOUS
  Administered 2021-04-24: 30 mg via INTRAVENOUS

## 2021-04-24 MED ORDER — ONDANSETRON HCL 4 MG/2ML IJ SOLN
INTRAMUSCULAR | Status: DC | PRN
  Administered 2021-04-24: 4 mg via INTRAVENOUS

## 2021-04-24 MED ORDER — TRAVASOL 10 % IV SOLN
INTRAVENOUS | Status: AC
  Filled 2021-04-24: qty 900

## 2021-04-24 MED ORDER — ACETAMINOPHEN 10 MG/ML IV SOLN
INTRAVENOUS | Status: DC | PRN
  Administered 2021-04-24: 1000 mg via INTRAVENOUS

## 2021-04-24 MED ORDER — PHENYLEPHRINE HCL-NACL 20-0.9 MG/250ML-% IV SOLN
INTRAVENOUS | Status: AC
  Filled 2021-04-24: qty 250

## 2021-04-24 MED ORDER — PHENYLEPHRINE HCL-NACL 20-0.9 MG/250ML-% IV SOLN
INTRAVENOUS | Status: DC | PRN
  Administered 2021-04-24: 40 ug/min via INTRAVENOUS

## 2021-04-24 MED ORDER — FENTANYL CITRATE (PF) 100 MCG/2ML IJ SOLN: 25.0000 ug | INTRAMUSCULAR | Status: DC | PRN

## 2021-04-24 MED ORDER — FENTANYL CITRATE (PF) 100 MCG/2ML IJ SOLN
INTRAMUSCULAR | Status: AC
  Filled 2021-04-24: qty 2

## 2021-04-24 MED ORDER — MORPHINE SULFATE (PF) 4 MG/ML IV SOLN: 4.0000 mg | INTRAVENOUS | Status: DC | PRN

## 2021-04-24 MED ORDER — ACETAMINOPHEN 10 MG/ML IV SOLN
INTRAVENOUS | Status: AC
  Filled 2021-04-24: qty 100

## 2021-04-24 MED ORDER — FENTANYL CITRATE (PF) 100 MCG/2ML IJ SOLN
INTRAMUSCULAR | Status: AC
  Administered 2021-04-24: 25 ug via INTRAVENOUS
  Filled 2021-04-24: qty 2

## 2021-04-24 MED ORDER — FENTANYL CITRATE (PF) 100 MCG/2ML IJ SOLN
INTRAMUSCULAR | Status: DC | PRN
  Administered 2021-04-24 (×2): 50 ug via INTRAVENOUS

## 2021-04-24 MED ORDER — PROPOFOL 10 MG/ML IV BOLUS
INTRAVENOUS | Status: AC
  Filled 2021-04-24: qty 40

## 2021-04-24 MED ORDER — HYDROMORPHONE HCL 1 MG/ML IJ SOLN
INTRAMUSCULAR | Status: DC | PRN
  Administered 2021-04-24 (×2): .2 mg via INTRAVENOUS

## 2021-04-24 MED ORDER — SUGAMMADEX SODIUM 200 MG/2ML IV SOLN
INTRAVENOUS | Status: DC | PRN
Start: 2021-04-24 — End: 2021-04-24
  Administered 2021-04-24: 200 mg via INTRAVENOUS

## 2021-04-24 MED ORDER — SODIUM CHLORIDE (PF) 0.9 % IJ SOLN
INTRAMUSCULAR | Status: AC
  Filled 2021-04-24: qty 50

## 2021-04-24 MED ORDER — ONDANSETRON HCL 4 MG/2ML IJ SOLN
INTRAMUSCULAR | Status: AC
  Filled 2021-04-24: qty 2

## 2021-04-24 MED ORDER — PROPOFOL 10 MG/ML IV BOLUS
INTRAVENOUS | Status: DC | PRN
  Administered 2021-04-24: 100 mg via INTRAVENOUS

## 2021-04-24 MED ORDER — BUPIVACAINE HCL (PF) 0.5 % IJ SOLN
INTRAMUSCULAR | Status: AC
  Filled 2021-04-24: qty 30

## 2021-04-24 MED ORDER — BUPIVACAINE LIPOSOME 1.3 % IJ SUSP
INTRAMUSCULAR | Status: AC
  Filled 2021-04-24: qty 20

## 2021-04-24 MED ORDER — 0.9 % SODIUM CHLORIDE (POUR BTL) OPTIME
TOPICAL | Status: DC | PRN
  Administered 2021-04-24: 500 mL
  Administered 2021-04-24: 2000 mL

## 2021-04-24 MED ORDER — HYDROMORPHONE HCL 1 MG/ML IJ SOLN
INTRAMUSCULAR | Status: AC
  Filled 2021-04-24: qty 1

## 2021-04-24 MED ORDER — SEVOFLURANE IN SOLN
RESPIRATORY_TRACT | Status: AC
  Filled 2021-04-24: qty 250

## 2021-04-24 MED ORDER — PHENYLEPHRINE HCL (PRESSORS) 10 MG/ML IV SOLN
INTRAVENOUS | Status: DC | PRN
  Administered 2021-04-24 (×5): 100 ug via INTRAVENOUS

## 2021-04-24 MED ORDER — ONDANSETRON HCL 4 MG/2ML IJ SOLN
INTRAMUSCULAR | Status: AC
  Filled 2021-04-24: qty 10

## 2021-04-24 MED ORDER — PIPERACILLIN-TAZOBACTAM 3.375 G IVPB
INTRAVENOUS | Status: AC
  Administered 2021-04-24: 3.375 g via INTRAVENOUS
  Filled 2021-04-24: qty 50

## 2021-04-24 MED ORDER — DEXAMETHASONE SODIUM PHOSPHATE 10 MG/ML IJ SOLN
INTRAMUSCULAR | Status: AC
  Filled 2021-04-24: qty 1

## 2021-04-24 MED ORDER — BUPIVACAINE LIPOSOME 1.3 % IJ SUSP
INTRAMUSCULAR | Status: DC | PRN
  Administered 2021-04-24: 50 mL

## 2021-04-24 SURGICAL SUPPLY — 42 items
BLADE SURG 15 STRL LF DISP TIS (BLADE) ×1 IMPLANT
BLADE SURG 15 STRL SS (BLADE) ×1
CHLORAPREP W/TINT 26 (MISCELLANEOUS) ×2 IMPLANT
DRAPE LAPAROTOMY 100X77 ABD (DRAPES) ×2 IMPLANT
DRSG OPSITE POSTOP 4X10 (GAUZE/BANDAGES/DRESSINGS) ×1 IMPLANT
ELECT BLADE 6.5 EXT (BLADE) ×1 IMPLANT
ELECT REM PT RETURN 9FT ADLT (ELECTROSURGICAL) ×2
ELECTRODE REM PT RTRN 9FT ADLT (ELECTROSURGICAL) ×1 IMPLANT
GAUZE 4X4 16PLY ~~LOC~~+RFID DBL (SPONGE) ×2 IMPLANT
GLOVE SURG ENC MOIS LTX SZ6.5 (GLOVE) ×8 IMPLANT
GLOVE SURG UNDER POLY LF SZ6.5 (GLOVE) ×8 IMPLANT
GOWN STRL REUS W/ TWL LRG LVL3 (GOWN DISPOSABLE) ×6 IMPLANT
GOWN STRL REUS W/TWL LRG LVL3 (GOWN DISPOSABLE) ×6
HOLDER FOLEY CATH W/STRAP (MISCELLANEOUS) ×2 IMPLANT
KIT OSTOMY 2 PC DRNBL 2.25 STR (WOUND CARE) IMPLANT
KIT OSTOMY DRAINABLE 2.25 STR (WOUND CARE) ×1
KIT TURNOVER KIT A (KITS) ×2 IMPLANT
LABEL OR SOLS (LABEL) ×2 IMPLANT
MANIFOLD NEPTUNE II (INSTRUMENTS) ×2 IMPLANT
NEEDLE HYPO 22GX1.5 SAFETY (NEEDLE) ×4 IMPLANT
NS IRRIG 1000ML POUR BTL (IV SOLUTION) ×2 IMPLANT
PACK BASIN MAJOR ARMC (MISCELLANEOUS) ×2 IMPLANT
PACK COLON CLEAN CLOSURE (MISCELLANEOUS) ×2 IMPLANT
RELOAD PROXIMATE 75MM BLUE (ENDOMECHANICALS) ×6 IMPLANT
RELOAD STAPLE 75 3.8 BLU REG (ENDOMECHANICALS) IMPLANT
SEALER TISSUE X1 CVD JAW (INSTRUMENTS) ×1 IMPLANT
SEPRAFILM MEMBRANE 5X6 (MISCELLANEOUS) ×1 IMPLANT
SPONGE T-LAP 18X18 ~~LOC~~+RFID (SPONGE) ×6 IMPLANT
STAPLER CVD CUT BL 40 RELOAD (ENDOMECHANICALS) ×2 IMPLANT
STAPLER CVD CUT BLU 40 RELOAD (ENDOMECHANICALS) IMPLANT
STAPLER PROXIMATE 75MM BLUE (STAPLE) ×1 IMPLANT
STAPLER SKIN PROX 35W (STAPLE) ×1 IMPLANT
SUT PDS PLUS 0 (SUTURE) ×2
SUT PDS PLUS AB 0 CT-2 (SUTURE) IMPLANT
SUT PROLENE 2 0 SH DA (SUTURE) ×1 IMPLANT
SUT SILK 3-0 (SUTURE) ×2 IMPLANT
SUT VIC AB 3-0 SH 27 (SUTURE) ×1
SUT VIC AB 3-0 SH 27X BRD (SUTURE) ×2 IMPLANT
SUT VIC AB 3-0 SH 8-18 (SUTURE) ×2 IMPLANT
SYR 20ML LL LF (SYRINGE) ×4 IMPLANT
TRAY FOLEY MTR SLVR 16FR STAT (SET/KITS/TRAYS/PACK) ×2 IMPLANT
WATER STERILE IRR 500ML POUR (IV SOLUTION) ×1 IMPLANT

## 2021-04-24 NOTE — Progress Notes (Signed)
Nutrition Follow Up Note   DOCUMENTATION CODES:   Not applicable  INTERVENTION:   Continue TPN per pharmacy   Daily weights   NUTRITION DIAGNOSIS:   Inadequate oral intake related to acute illness as evidenced by NPO status. -ongoing   GOAL:   Patient will meet greater than or equal to 90% of their needs -not met   MONITOR:   Diet advancement, Labs, Weight trends, Skin, I & O's, TPN  ASSESSMENT:   75 y.o. 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  Pt tolerating TPN at goal rate. Refeed labs stable. Pt with continued abdominal distension and burping. NGT in place with 104m output. Plan is for partial colectomy and end colostomy creation today. Per chart, pt is down ~10lbs since admit. RD will increase estimated needs in setting of planned surgery and wt loss.   Medications reviewed and include: lovenox, insulin, protonix, zosyn  Labs reviewed: Na 135 wnl, K 3.7 wnl, Mg 2.1 wnl P 3.5- 11/21 Triglycerides 114- 11/21 Hgb 10.7(L), Hct 32.8(L) cbgs- 129, 143 x 24 hrs  Diet Order:   Diet Order             Diet NPO time specified Except for: Ice Chips  Diet effective now                  EDUCATION NEEDS:   Education needs have been addressed  Skin:  Skin Assessment: Reviewed RN Assessment  Last BM:  11/22- type 3  Height:   Ht Readings from Last 1 Encounters:  04/15/21 _0  (1.575 m)    Weight:   Wt Readings from Last 1 Encounters:  04/24/21 42.6 kg    Ideal Body Weight:  50 kg  BMI:  Body mass index is 17.18 kg/m.  Estimated Nutritional Needs:   Kcal:  1700-1900kcal/day  Protein:  85-95g/day  Fluid:  1.3-1.5L/day  CKoleen DistanceMS, RD, LDN Please refer to [facility] Green[facility]for RD and/or RD on-call/weekend/after hours pager

## 2021-04-24 NOTE — Interval H&P Note (Signed)
History and Physical Interval Note:  04/24/2021 12:52 PM  Mary Washington  has presented today for surgery, with the diagnosis of Diverticilitis with abscess.  The various methods of treatment have been discussed with the patient and family. After consideration of risks, benefits and other options for treatment, the patient has consented to  Procedure(s): PARTIAL COLECTOMY WITH COLOSTOMY CREATION (N/A) as a surgical intervention.  The patient's history has been reviewed, patient examined, no change in status, stable for surgery.  I have reviewed the patient's chart and labs.  Questions were answered to the patient's satisfaction.     Herbert Pun

## 2021-04-24 NOTE — Progress Notes (Signed)
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 Continue w/ NG tube as per general surg. Continue on IV zosyn. NPO but continue on TPN. Zofran prn.  Will go for partial colectomy w/ colostomy today as per general surg    Hyponatremia: likely due to volume depletion from poor po intake. Resolved    Hypokalemia: WNL today    Pancytopenia: likely secondary to chemo. Recently completed chemo last week and received a dose of G CSF. WBC is now WNL & platelets are elevated at 577. Onco is Dr. Gildardo Griffes. Will continue to monitor    Hx of diffuse large B-cell lymphoma: last chemo 2 weeks ago     DVT prophylaxis: lovenox  Code Status: full  Family Communication: discussed pt's care w/ pt's husband and bedside and answered his questions  Disposition Plan: unclear at this time   Level of care: Telemetry Medical  Status is: Inpatient  Remains inpatient appropriate because: severity of illness, going for partial colectomy w/ colostomy today     Consultants:  General surg Onco   Procedures:   Antimicrobials: zosyn   Subjective: Pt c/o fatigue   Objective: Vitals:   04/23/21 1459 04/23/21 2132 04/24/21 0500 04/24/21 0804  BP: (!) 149/68 132/61  (!) 122/59  Pulse: 98 (!) 110  (!) 105  Resp: 18   16  Temp: 97.9 F (36.6 C) 98.9 F (37.2 C)  98 F (36.7 C)  TempSrc: Oral Oral  Oral  SpO2: 100% 97%  96%  Weight:   42.6 kg   Height:        Intake/Output Summary (Last 24 hours) at 04/24/2021 0908 Last data filed at 04/24/2021 0650 Gross per 24 hour  Intake 3307.81 ml  Output 1000 ml  Net 2307.81 ml   Filed Weights    04/22/21 0557 04/23/21 0500 04/24/21 0500  Weight: 47.8 kg 47.5 kg 42.6 kg    Examination:  General exam: Appears calm and comfortable  Respiratory system: Clear to auscultation. Respiratory effort normal. Cardiovascular system: S1 & S2 +. No  rubs, gallops or clicks. Gastrointestinal system: Abdomen is distended, soft and nontender. Hypoactive bowel sounds heard. Central nervous system: Alert and oriented. Moves all extremities  Psychiatry: Judgement and insight appear normal. Mood & affect appropriate.     Data Reviewed: I have personally reviewed following labs and imaging studies  CBC: Recent Labs  Lab 04/20/21 0550 04/21/21 0555 04/22/21 0629 04/23/21 0500 04/24/21 0412  WBC 5.4 5.8 6.9 8.0 9.2  NEUTROABS 4.1 4.0 4.9 5.7 6.4  HGB 10.4* 10.7* 10.7* 10.8* 10.7*  HCT 30.8* 31.9* 32.5* 33.3* 32.8*  MCV 86.0 85.8 86.7 87.4 87.5  PLT 322 381 481* 521* 194*   Basic Metabolic Panel: Recent Labs  Lab 04/19/21 2023 04/20/21 0550 04/21/21 0555 04/22/21 0629 04/23/21 0500 04/24/21 0412  NA 124* 126* 131* 132* 130* 135  K 3.7 3.3* 3.9 3.9 3.5 3.7  CL 92* 92* 99 99 97* 102  CO2 22 25 27 26 25 27   GLUCOSE 84 123* 155* 128* 141* 122*  BUN <5* <5* 10 16 17 19   CREATININE 0.49 0.30* 0.48 0.42* 0.46 0.48  CALCIUM 8.3* 8.1* 8.4* 8.7* 8.6* 8.8*  MG 2.0 1.6* 2.1 2.0  --  2.1  PHOS  --  2.6 3.4 3.5  --   --    GFR: Estimated Creatinine Clearance: 41.5 mL/min (by C-G formula based on SCr of 0.48 mg/dL). Liver Function Tests: Recent Labs  Lab 04/19/21 2023 04/20/21 0550 04/22/21 0629  AST 24 21 21   ALT 13 13 12   ALKPHOS 73 68 67  BILITOT 1.1 0.6 0.4  PROT 5.7* 5.4* 5.5*  ALBUMIN 3.0* 2.5* 2.8*   No results for input(s): LIPASE, AMYLASE in the last 168 hours. No results for input(s): AMMONIA in the last 168 hours. Coagulation Profile: No results for input(s): INR, PROTIME in the last 168 hours. Cardiac Enzymes: No results for input(s): CKTOTAL, CKMB, CKMBINDEX,  TROPONINI in the last 168 hours. BNP (last 3 results) No results for input(s): PROBNP in the last 8760 hours. HbA1C: No results for input(s): HGBA1C in the last 72 hours. CBG: Recent Labs  Lab 04/23/21 0531 04/23/21 1139 04/23/21 1806 04/24/21 0034 04/24/21 0628  GLUCAP 159* 132* 104* 143* 129*   Lipid Profile: Recent Labs    04/22/21 0629  TRIG 114   Thyroid Function Tests: No results for input(s): TSH, T4TOTAL, FREET4, T3FREE, THYROIDAB in the last 72 hours. Anemia Panel: No results for input(s): VITAMINB12, FOLATE, FERRITIN, TIBC, IRON, RETICCTPCT in the last 72 hours. Sepsis Labs: No results for input(s): PROCALCITON, LATICACIDVEN in the last 168 hours.  Recent Results (from the past 240 hour(s))  Blood culture (routine single)     Status: None   Collection Time: 04/15/21  6:10 AM   Specimen: BLOOD  Result Value Ref Range Status   Specimen Description BLOOD RIGHT [facility]  Final   Special Requests   Final    BOTTLES DRAWN AEROBIC AND ANAEROBIC Blood Culture adequate volume   Culture   Final    NO GROWTH 5 DAYS Performed at [facility], [address] [place], [place] [address]    Report Status 04/20/2021 FINAL  Final  Culture, blood (single)     Status: None   Collection Time: 04/15/21  8:00 AM   Specimen: BLOOD  Result Value Ref Range Status   Specimen Description BLOOD RIGHT ARM  Final   Special Requests   Final    BOTTLES DRAWN AEROBIC AND ANAEROBIC Blood Culture adequate volume   Culture   Final    NO GROWTH 5 DAYS Performed at [facility], [address], [place], [place] [address]    Report Status 04/20/2021 FINAL  Final  Resp Panel by RT-PCR (Flu A&B, Covid) Nasopharyngeal Swab     Status: None   Collection Time: 04/15/21  8:00 AM   Specimen: Nasopharyngeal Swab; Nasopharyngeal(NP) swabs in vial transport medium  Result Value Ref Range Status   SARS Coronavirus 2 by RT PCR NEGATIVE NEGATIVE Final    Comment: (NOTE) SARS-CoV-2  target nucleic acids are NOT DETECTED.  The SARS-CoV-2 RNA is generally detectable in upper respiratory specimens during the acute phase of infection. The lowest concentration of SARS-CoV-2 viral copies this assay can detect is 138 copies/mL. A negative result does not preclude SARS-Cov-2 infection and should not be used as the sole basis for treatment or other patient management decisions. A negative result may occur with  improper specimen collection/handling, submission of specimen other than nasopharyngeal swab, presence of viral mutation(s) within the areas targeted by this assay, and inadequate number of viral copies(<138 copies/mL). A negative result  must be combined with clinical observations, patient history, and epidemiological information. The expected result is Negative.  Fact Sheet for Patients:  EntrepreneurPulse.com.au  Fact Sheet for Healthcare Providers:  IncredibleEmployment.be  This test is no t yet approved or cleared by the [place] FDA and  has been authorized for detection and/or diagnosis of SARS-CoV-2 by FDA under an Emergency Use Authorization (EUA). This EUA will remain  in effect (meaning this test can be used) for the duration of the COVID-19 declaration under Section 564(b)(1) of the Act, 21 U.S.C.section 360bbb-3(b)(1), unless the authorization is terminated  or revoked sooner.       Influenza A by PCR NEGATIVE NEGATIVE Final   Influenza B by PCR NEGATIVE NEGATIVE Final    Comment: (NOTE) The Xpert Xpress SARS-CoV-2/FLU/RSV plus assay is intended as an aid in the diagnosis of influenza from Nasopharyngeal swab specimens and should not be used as a sole basis for treatment. Nasal washings and aspirates are unacceptable for Xpert Xpress SARS-CoV-2/FLU/RSV testing.  Fact Sheet for Patients: EntrepreneurPulse.com.au  Fact Sheet for Healthcare  Providers: IncredibleEmployment.be  This test is not yet approved or cleared by the [place] FDA and has been authorized for detection and/or diagnosis of SARS-CoV-2 by FDA under an Emergency Use Authorization (EUA). This EUA will remain in effect (meaning this test can be used) for the duration of the COVID-19 declaration under Section 564(b)(1) of the Act, 21 U.S.C. section 360bbb-3(b)(1), unless the authorization is terminated or revoked.  Performed at [facility], [address], [place], [place] [address]   Urine Culture     Status: None   Collection Time: 04/15/21  8:15 AM   Specimen: In/Out Cath Urine  Result Value Ref Range Status   Specimen Description   Final    IN/OUT CATH URINE Performed at [facility], [address], [place], [place] [address]    Special Requests   Final    NONE Performed at [facility], [address], [place], [place] [address]    Culture   Final    NO GROWTH Performed at [facility], [place] [address], [place], [place] [address]    Report Status 04/16/2021 FINAL  Final         Radiology Studies: DG Abd 1 View  Result Date: 04/23/2021 CLINICAL DATA:  Nasogastric tube placement. EXAM: ABDOMEN - 1 VIEW COMPARISON:  CT abdomen pelvis 04/18/2021 FINDINGS: Nasogastric tube coursing below the hemidiaphragm with tip and side port overlying the expected region the gastric lumen. Gaseous dilatation of several loops of small bowel. No radio-opaque calculi or other significant radiographic abnormality are seen. Visualized lungs are grossly unremarkable. Kyphoplasty noted. Accessed right chest port Port-A-Cath with tip overlying the right atrium. IMPRESSION: 1. Enteric tube in good position. 2. Gaseous dilatation of the small bowel. Findings may represent small bowel obstruction versus ileus. Electronically Signed   By: Iven Finn M.D.   On: 04/23/2021 16:10   DG Abd 1 View  Result Date:  04/23/2021 CLINICAL DATA:  NG tube placement. EXAM: ABDOMEN - 1 VIEW COMPARISON:  Radiograph 04/22/2021 FINDINGS: The NG tube is not identified on this radiograph. It may have been pulled back or removed. The lung bases are clear. Persistent dilated small bowel. IMPRESSION: NG tube is not identified on this radiograph. Electronically Signed   By: Marijo Sanes M.D.   On: 04/23/2021 12:14   DG Chest Port 1 View  Result Date: 04/23/2021 CLINICAL DATA:  Hemoptysis EXAM: PORTABLE CHEST  1 VIEW COMPARISON:  Portable exam 1120 hours compared to 01/15/2021 FINDINGS: RIGHT jugular Port-A-Cath with tip projecting over SVC. Tip of a tube projects 10.3 cm above carina, question endotracheal versus nasogastric tube. Heart size, mediastinal contours, and pulmonary vascularity. Linear subsegmental atelectasis at lung bases. Improved aeration versus previous exam. No pleural effusion or pneumothorax. Bones demineralized with prior vertebroplasty at thoracolumbar junction. Surgical clips LEFT axilla. IMPRESSION: Significantly improved aeration since previous exam with minimal bibasilar atelectasis. Tip of a tube projects 10.3 cm above carina, uncertain if represents an endotracheal or nasogastric tube. Electronically Signed   By: Lavonia Dana M.D.   On: 04/23/2021 11:53        Scheduled Meds:  benzocaine   Mouth/Throat BID   Chlorhexidine Gluconate Cloth  6 each Topical Daily   enoxaparin (LOVENOX) injection  40 mg Subcutaneous Q24H   insulin aspart  0-9 Units Subcutaneous Q6H   lidocaine-prilocaine   Topical Once   pantoprazole (PROTONIX) IV  40 mg Intravenous QHS   Continuous Infusions:  sodium chloride 10 mL/hr at 04/20/21 1045   acyclovir 105.3 mL/hr at 04/24/21 0650   piperacillin-tazobactam (ZOSYN)  IV 3.375 g (04/24/21 0621)   TPN ADULT (ION) 75 mL/hr at 04/24/21 0650   TPN ADULT (ION)       LOS: 9 days    Time spent: 33 mins     Wyvonnia Dusky, MD [facility] Pager [phone]  [phone]  If 7PM-7AM, please contact night-coverage 04/24/2021, 9:08 AM

## 2021-04-24 NOTE — Anesthesia Procedure Notes (Signed)
Procedure Name: Intubation Date/Time: 04/24/2021 1:49 PM Performed by: Gentry Fitz, CRNA Pre-anesthesia Checklist: Patient identified, Emergency Drugs available, Suction available and Patient being monitored Patient Re-evaluated:Patient Re-evaluated prior to induction Oxygen Delivery Method: Circle system utilized Preoxygenation: Pre-oxygenation with 100% oxygen Induction Type: IV induction Ventilation: Mask ventilation without difficulty Laryngoscope Size: McGraph and 4 Grade View: Grade II Tube type: Oral Number of attempts: 1 Airway Equipment and Method: Stylet and Oral airway Placement Confirmation: ETT inserted through vocal cords under direct vision, positive ETCO2 and breath sounds checked- equal and bilateral Tube secured with: Tape Dental Injury: Teeth and Oropharynx as per pre-operative assessment

## 2021-04-24 NOTE — Anesthesia Preprocedure Evaluation (Signed)
Anesthesia Evaluation  Patient identified by MRN, date of birth, ID band Patient awake    Reviewed: Allergy & Precautions, H&P , NPO status , Patient's Chart, lab work & pertinent test results  Airway Mallampati: III  TM Distance: >3 FB Neck ROM: Full    Dental  (+) Poor Dentition   Pulmonary asthma ,    Pulmonary exam normal        Cardiovascular negative cardio ROS Normal cardiovascular exam     Neuro/Psych negative neurological ROS  negative psych ROS   GI/Hepatic Neg liver ROS, GERD  Medicated,  Endo/Other  negative endocrine ROS  Renal/GU negative Renal ROS  negative genitourinary   Musculoskeletal negative musculoskeletal ROS (+)   Abdominal   Peds negative pediatric ROS (+)  Hematology negative hematology ROS (+) anemia ,   Anesthesia Other Findings complicated diverticulitis with pelvic abscess . Colon polyps  . Diverticulosis  . Melanoma ([place]) 1988  lt upper arm, some lymph nodes removed . Mild intermittent asthma  . Osteoporosis  B Cell Lymphoma   Reproductive/Obstetrics negative OB ROS                           Anesthesia Physical Anesthesia Plan  ASA: 3  Anesthesia Plan: General   Post-op Pain Management:    Induction: Intravenous  PONV Risk Score and Plan: 3 and Propofol infusion, Ondansetron and Midazolam  Airway Management Planned: Oral ETT  Additional Equipment:   Intra-op Plan:   Post-operative Plan: Extubation in OR  Informed Consent: I have reviewed the patients History and Physical, chart, labs and discussed the procedure including the risks, benefits and alternatives for the proposed anesthesia with the patient or authorized representative who has indicated his/her understanding and acceptance.       Plan Discussed with: CRNA, Anesthesiologist and Surgeon  Anesthesia Plan Comments:         Anesthesia Quick Evaluation

## 2021-04-24 NOTE — Transfer of Care (Signed)
Immediate Anesthesia Transfer of Care Note  Patient: Mary Washington  Procedure(s) Performed: PARTIAL COLECTOMY  Patient Location: PACU  Anesthesia Type:General  Level of Consciousness: drowsy  Airway & Oxygen Therapy: Patient Spontanous Breathing and Patient connected to face mask oxygen  Post-op Assessment: Report given to RN and Post -op Vital signs reviewed and stable  Post vital signs: Reviewed and stable  Last Vitals:  Vitals Value Taken Time  BP 137/51 04/24/21 1813  Temp    Pulse 101 04/24/21 1815  Resp 19 04/24/21 1815  SpO2 100 % 04/24/21 1815  Vitals shown include unvalidated device data.  Last Pain:  Vitals:   04/24/21 1226  TempSrc: Oral  PainSc: 0-No pain      Patients Stated Pain Goal: 0 (29/84/73 0856)  Complications: No notable events documented.

## 2021-04-24 NOTE — Op Note (Signed)
Preoperative diagnosis: Diverticular disease with perforation.                                          Small bowel obstruction  Postoperative diagnosis: Diverticular disease with perforation.                                             Small bowel obstruction                                             Mesenteric mass  Procedure: Sigmoid colon resection with colostomy (Hartmann's procedure).                      Small bowel resection with anastomosis                     Excision of mesenteric mass  Anesthesia: GETA  Surgeon: Dr. Windell Moment, MD  Wound Classification: Dirty  Indications:  Patient is a 75 y.o. female admitted with diverticulitis [place] 3.  She was initially treated with IV antibiotic therapy, bowel rest.  She did develop a pattern of ileus versus small bowel obstruction.  After optimization of white blood cell count (severe neutropenia due to chemotherapy) hemoglobin and electrolytes and no resolution of the ileus/small bowel obstruction, decision was to proceed with exploratory laparotomy, sigmoid colectomy, end colostomy creation.  Findings: 1.  Upon entering the abdomen there were multiple pelvic abscesses that were drained. 2.  Severe induration of the sigmoid colon. 3.  2 loops of small bowel were adhered to the abscesses cavities causing obstruction 4.  There was a indurated mesenteric mass that was able to be enucleated, sent to pathology.  Description of procedure:  The patient was placed in the supine position and general endotracheal anesthesia was induced. A time-out was completed verifying correct patient, procedure, site, positioning, and implant(s) and/or special equipment prior to beginning this procedure. Preoperative antibiotics were given. A Foley catheter and nasogastric tube were placed. The abdomen was prepped and draped in the usual sterile fashion. A vertical midline incision was made from xiphoid to just above the pubis. This was deepened through the  subcutaneous tissues and hemostasis was achieved with electrocautery. The linea alba was identified and incised and the peritoneal cavity entered. The abdomen was explored. Adhesions were lysed sharply under direct vision with Metzenbaum scissors.  Multiple pelvic abscesses were identified with purulence.  There was able to be completely drained. The small bowel was inspected and retracted to the right using a moist towel and self-retaining retractor.  2 loops of small bowel were adhered to the pelvic abscesses.  There were no able to easily be dissected free and a small bowel resection was needed to leave healthy small bowel.  A window was created by using a curved hemostat to sepa- rate the mesentery from the bowel at each resection margin. The mesentery was scored and serially divided with hemostats, and the vessels were then ligated with 3-0 silk ties.  The bowel was divided with a cutting linear stapler at each resection margin and passed off the table as a specimen. The antimesenteric angles of  the proximal and distal seg- ments were then approximated with two sutures of 3-0 silk placed approximately 5 cm apart. Enterotomies were made at the antimesenteric borders and the cutting linear stapler inserted and fired. The lumen was inspected for hemostasis. The enterotomies were closed with a single firing of a linear stapler. The anastomosis was then inspected for patency and integrity. The mesenteric defect was closed with a running 3-0 Vicryl suture.    Using electrocautery, the colon was freed from its peritoneal attachments along the line of Toldt.  Both ureters were identified and protected.  Points of transection were selected proximally and distally. The bowel was divided with the linear cutting stapler. The peritoneum overlying the mesentery was then scored with electrocautery and the left colic artery was identified, double ligated with 2-0 silk sutures, and transected. The peritoneum overlying  the mesentery was scored and remaining mesentery divided and ligated with Enseal device.  The specimen was removed.  The abdominal cavity was then copiously irrigated and hemostasis was checked.  The proximal colon reached easily to the proposed colostomy site without tension. A disk of skin was removed from the colostomy site in the left lower quadrant. The incision was deepened through all layers of the abdominal wall and dilated to admit two fingers. The colon was passed out through the ostomy site without torsion or tension.    The Hartmann's pouch was tagged with two long sutures of 2-0 prolene and allowed to fall into the pelvis.  The fascia was closed with a running suture of PDS 0. The skin was closed with skin staples.  The colostomy was matured with multiple interrupted sutures of 3-0 Vicryl. An ostomy bag was applied.  The patient tolerated the procedure well and was taken to the postanesthesia care unit in stable condition.   Specimen: Sigmoid colon                     Small bowel                     Mesenteric mass  Complications: None  EBL: 100 mL

## 2021-04-24 NOTE — Anesthesia Procedure Notes (Signed)
Procedure Name: Intubation Date/Time: 04/24/2021 1:49 PM Performed by: Gentry Fitz, CRNA Pre-anesthesia Checklist: Patient identified, Emergency Drugs available, Suction available and Patient being monitored Patient Re-evaluated:Patient Re-evaluated prior to induction Oxygen Delivery Method: Circle system utilized Preoxygenation: Pre-oxygenation with 100% oxygen Induction Type: IV induction Ventilation: Mask ventilation without difficulty Laryngoscope Size: McGraph and 4 Grade View: Grade II Tube type: Oral Tube size: 7.0 mm Number of attempts: 1 Airway Equipment and Method: Stylet and Oral airway Placement Confirmation: ETT inserted through vocal cords under direct vision, positive ETCO2 and breath sounds checked- equal and bilateral Secured at: 22 cm Tube secured with: Tape Dental Injury: Teeth and Oropharynx as per pre-operative assessment

## 2021-04-24 NOTE — Progress Notes (Signed)
PHARMACY - TOTAL PARENTERAL NUTRITION CONSULT NOTE   Indication: Prolonged ileus  Patient Measurements: Height: 5\' 2"  (157.5 cm) Weight: 42.6 kg (93 lb 14.7 oz) IBW/kg (Calculated) : 50.1 TPN AdjBW (KG): 51.7 Body mass index is 17.18 kg/m.  Assessment: 75 y.o. 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  Glucose / Insulin: BG/24h 104 - 143 requiring 5 units SSI  --on sensitive SSI Electrolytes: WNL Renal: SCr<1, stable Hepatic: LFTs wnl, TG wnl  Intake / Output; MIVF: no MIVF GI Imaging: 11/17 CT Abd: Sigmoid diverticulitis GI Surgeries / Procedures: no recent surgical interventions  Central access: 11/23/20 TPN start date: 04/19/21  Nutritional Goals: Goal TPN rate is 75 mL/hr (provides 90 g of protein and 1650 kcals per day)  RD Assessment: Estimated Needs Total Energy Estimated Needs: 1500-1700kcal/day Total Protein Estimated Needs: 75-85g/day Total Fluid Estimated Needs: 1.3-1.5L/day  Current Nutrition: NPO  Plan:  Continue TPN at 75 mL/hr at 1800  Total volume including overfill 1800 mL Nutritional Components: Amino acids (using 10% Travasol): 90 grams  Dextrose: 234 grams Lipids (using 20% SMOFlipid): 49.6 grams Total kCal: 1651 / 24h Electrolytes in TPN: Na 120 mEq/L, K 50 mEq/L, Ca 5 mEq/L, Mg 5 mEq/L, and Phos 5 mmol/L. Cl:Ac 1:1 Add standard MVI and trace elements to TPN Continue Sensitive q6h SSI and adjust as needed  Monitor TPN labs on Mon/Thurs, daily until stable  Dallie Piles, PharmD, BCPS Clinical Pharmacist 04/24/2021 7:34 AM

## 2021-04-24 NOTE — Anesthesia Postprocedure Evaluation (Signed)
Anesthesia Post Note  Patient: Clinical biochemist  Procedure(s) Performed: PARTIAL COLECTOMY  Patient location during evaluation: PACU Anesthesia Type: General Level of consciousness: awake and alert Pain management: pain level controlled Vital Signs Assessment: post-procedure vital signs reviewed and stable Respiratory status: spontaneous breathing, nonlabored ventilation, respiratory function stable and patient connected to nasal cannula oxygen Cardiovascular status: blood pressure returned to baseline and stable Postop Assessment: no apparent nausea or vomiting Anesthetic complications: no   No notable events documented.   Last Vitals:  Vitals:   04/24/21 1900 04/24/21 1950  BP: (!) 117/55 (!) 123/56  Pulse: (!) 101 (!) 108  Resp: 19 20  Temp: (!) 36.4 C 36.8 C  SpO2: 97% 98%    Last Pain:  Vitals:   04/24/21 1950  TempSrc: Oral  PainSc:                  Arita Miss

## 2021-04-25 ENCOUNTER — Encounter: Payer: Self-pay | Admitting: General Surgery

## 2021-04-25 DIAGNOSIS — C8581 Other specified types of non-Hodgkin lymphoma, lymph nodes of head, face, and neck: Secondary | ICD-10-CM | POA: Diagnosis not present

## 2021-04-25 DIAGNOSIS — K5792 Diverticulitis of intestine, part unspecified, without perforation or abscess without bleeding: Secondary | ICD-10-CM | POA: Diagnosis not present

## 2021-04-25 DIAGNOSIS — K56609 Unspecified intestinal obstruction, unspecified as to partial versus complete obstruction: Secondary | ICD-10-CM | POA: Diagnosis not present

## 2021-04-25 LAB — MAGNESIUM: Magnesium: 2 mg/dL (ref 1.7–2.4)

## 2021-04-25 LAB — CBC WITH DIFFERENTIAL/PLATELET
Abs Immature Granulocytes: 0.55 10*3/uL — ABNORMAL HIGH (ref 0.00–0.07)
Basophils Absolute: 0.1 10*3/uL (ref 0.0–0.1)
Basophils Relative: 1 %
Eosinophils Absolute: 0 10*3/uL (ref 0.0–0.5)
Eosinophils Relative: 0 %
HCT: 31.8 % — ABNORMAL LOW (ref 36.0–46.0)
Hemoglobin: 10.2 g/dL — ABNORMAL LOW (ref 12.0–15.0)
Immature Granulocytes: 5 %
Lymphocytes Relative: 5 %
Lymphs Abs: 0.6 10*3/uL — ABNORMAL LOW (ref 0.7–4.0)
MCH: 28.7 pg (ref 26.0–34.0)
MCHC: 32.1 g/dL (ref 30.0–36.0)
MCV: 89.3 fL (ref 80.0–100.0)
Monocytes Absolute: 1.8 10*3/uL — ABNORMAL HIGH (ref 0.1–1.0)
Monocytes Relative: 14 %
Neutro Abs: 9.3 10*3/uL — ABNORMAL HIGH (ref 1.7–7.7)
Neutrophils Relative %: 75 %
Platelets: 556 10*3/uL — ABNORMAL HIGH (ref 150–400)
RBC: 3.56 MIL/uL — ABNORMAL LOW (ref 3.87–5.11)
RDW: 16.8 % — ABNORMAL HIGH (ref 11.5–15.5)
WBC: 12.3 10*3/uL — ABNORMAL HIGH (ref 4.0–10.5)
nRBC: 0 % (ref 0.0–0.2)

## 2021-04-25 LAB — GLUCOSE, CAPILLARY
Glucose-Capillary: 148 mg/dL — ABNORMAL HIGH (ref 70–99)
Glucose-Capillary: 151 mg/dL — ABNORMAL HIGH (ref 70–99)
Glucose-Capillary: 131 mg/dL — ABNORMAL HIGH (ref 70–99)

## 2021-04-25 LAB — COMPREHENSIVE METABOLIC PANEL
ALT: 13 U/L (ref 0–44)
AST: 17 U/L (ref 15–41)
Albumin: 2.3 g/dL — ABNORMAL LOW (ref 3.5–5.0)
Alkaline Phosphatase: 63 U/L (ref 38–126)
Anion gap: 4 — ABNORMAL LOW (ref 5–15)
BUN: 25 mg/dL — ABNORMAL HIGH (ref 8–23)
CO2: 26 mmol/L (ref 22–32)
Calcium: 8.5 mg/dL — ABNORMAL LOW (ref 8.9–10.3)
Chloride: 108 mmol/L (ref 98–111)
Creatinine, Ser: 0.57 mg/dL (ref 0.44–1.00)
GFR, Estimated: >60 mL/min (ref >60)
Glucose, Bld: 165 mg/dL — ABNORMAL HIGH (ref 70–99)
Potassium: 4 mmol/L (ref 3.5–5.1)
Sodium: 138 mmol/L (ref 135–145)
Total Bilirubin: 0.3 mg/dL (ref 0.3–1.2)
Total Protein: 4.9 g/dL — ABNORMAL LOW (ref 6.5–8.1)

## 2021-04-25 LAB — PHOSPHORUS: Phosphorus: 3 mg/dL (ref 2.5–4.6)

## 2021-04-25 MED ORDER — TRAVASOL 10 % IV SOLN
INTRAVENOUS | Status: AC
  Filled 2021-04-25: qty 936

## 2021-04-25 NOTE — Progress Notes (Signed)
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 S/p sigmoid colon resection w/ colostomy, small bowel resection w/ anastomosis, excision of mesenteric mass. Mesenteric mass sent to pathology. Continue on IV zosyn. NPO except for ice chips. General surg following and recs   Leukocytosis: likely secondary to above. Continue on IV abxs  Normocytic anemia: likely secondary to recent chemo. No need for a transfusion currently  Thrombocytosis: etiology unclear. Possibly reactive. Will continue to monitor   Hyponatremia: resolved    Hypokalemia: WNL today   Hx of diffuse large B-cell lymphoma: last chemo 2 weeks ago     DVT prophylaxis: lovenox  Code Status: full  Family Communication: discussed pt's care w/ pt's husband and bedside and answered his questions  Disposition Plan: unclear at this time   Level of care: Telemetry Medical  Status is: Inpatient  Remains inpatient appropriate because: severity of illness, going for partial colectomy w/ colostomy today     Consultants:  General surg Onco   Procedures:   Antimicrobials: zosyn   Subjective: Pt c/o malaise   Objective: Vitals:   04/25/21 0435 04/25/21 0550 04/25/21 0622 04/25/21 0739  BP: 123/66   (!) 131/55  Pulse: (!) 117  (!) 107 (!) 117  Resp: 20   18  Temp: 97.7 F (36.5 C)   98.4 F (36.9 C)  TempSrc:    Oral  SpO2: 98%   97%  Weight:  47.2 kg    Height:        Intake/Output Summary (Last 24 hours) at 04/25/2021 0744 Last data filed at 04/25/2021 0615 Gross per 24 hour  Intake 1400 ml  Output 1310 ml  Net 90 ml   Filed Weights   04/23/21  0500 04/24/21 0500 04/25/21 0550  Weight: 47.5 kg 42.6 kg 47.2 kg    Examination:  General exam: Appears comfortable  Respiratory system: clear breath sounds b/l  Cardiovascular system: S1/S2+. No rubs or clicks  Gastrointestinal system: Abd is soft, mild tenderness to palpation, & hypoactive bowel sounds. Ostomy bag present  Central nervous system: Alert and oriented. Moves all extremities  Psychiatry: Judgement and insight appear normal. Appropriate mood and affect     Data Reviewed: I have personally reviewed following labs and imaging studies  CBC: Recent Labs  Lab 04/21/21 0555 04/22/21 0629 04/23/21 0500 04/24/21 0412 04/25/21 0440  WBC 5.8 6.9 8.0 9.2 12.3*  NEUTROABS 4.0 4.9 5.7 6.4 9.3*  HGB 10.7* 10.7* 10.8* 10.7* 10.2*  HCT 31.9* 32.5* 33.3* 32.8* 31.8*  MCV 85.8 86.7 87.4 87.5 89.3  PLT 381 481* 521* 577* 308*   Basic Metabolic Panel: Recent Labs  Lab 04/20/21 0550 04/21/21 0555 04/22/21 0629 04/23/21 0500 04/24/21 0412 04/25/21 0440  NA 126* 131* 132* 130* 135 138  K 3.3* 3.9 3.9 3.5 3.7 4.0  CL 92* 99 99 97* 102 108  CO2 25 27 26 25 27 26   GLUCOSE 123* 155* 128* 141* 122* 165*  BUN <5* 10 16 17 19  25*  CREATININE 0.30* 0.48 0.42* 0.46 0.48 0.57  CALCIUM 8.1* 8.4* 8.7* 8.6* 8.8* 8.5*  MG 1.6* 2.1 2.0  --  2.1 2.0  PHOS 2.6 3.4 3.5  --   --  3.0   GFR: Estimated Creatinine Clearance: 46 mL/min (by C-G formula based on SCr of 0.57 mg/dL). Liver Function Tests: Recent Labs  Lab 04/19/21 2023 04/20/21 0550 04/22/21 0629 04/25/21 0440  AST 24 21 21 17   ALT 13 13 12 13   ALKPHOS 73 68 67 63  BILITOT 1.1 0.6 0.4 0.3  PROT 5.7* 5.4* 5.5* 4.9*  ALBUMIN 3.0* 2.5* 2.8* 2.3*   No results for input(s): LIPASE, AMYLASE in the last 168 hours. No results for input(s): AMMONIA in the last 168 hours. Coagulation Profile: No results for input(s): INR, PROTIME in the last 168 hours. Cardiac Enzymes: No results for input(s): CKTOTAL, CKMB, CKMBINDEX,  TROPONINI in the last 168 hours. BNP (last 3 results) No results for input(s): PROBNP in the last 8760 hours. HbA1C: No results for input(s): HGBA1C in the last 72 hours. CBG: Recent Labs  Lab 04/24/21 0034 04/24/21 0628 04/24/21 1823 04/24/21 2259 04/25/21 0607  GLUCAP 143* 129* 233* 207* 148*   Lipid Profile: No results for input(s): CHOL, HDL, LDLCALC, TRIG, CHOLHDL, LDLDIRECT in the last 72 hours.  Thyroid Function Tests: No results for input(s): TSH, T4TOTAL, FREET4, T3FREE, THYROIDAB in the last 72 hours. Anemia Panel: No results for input(s): VITAMINB12, FOLATE, FERRITIN, TIBC, IRON, RETICCTPCT in the last 72 hours. Sepsis Labs: No results for input(s): PROCALCITON, LATICACIDVEN in the last 168 hours.  Recent Results (from the past 240 hour(s))  Culture, blood (single)     Status: None   Collection Time: 04/15/21  8:00 AM   Specimen: BLOOD  Result Value Ref Range Status   Specimen Description BLOOD RIGHT ARM  Final   Special Requests   Final    BOTTLES DRAWN AEROBIC AND ANAEROBIC Blood Culture adequate volume   Culture   Final    NO GROWTH 5 DAYS Performed at [facility], [address] [place], [place] [address]    Report Status 04/20/2021 FINAL  Final  Resp Panel by RT-PCR (Flu A&B, Covid) Nasopharyngeal Swab     Status: None   Collection Time: 04/15/21  8:00 AM   Specimen: Nasopharyngeal Swab; Nasopharyngeal(NP) swabs in vial transport medium  Result Value Ref Range Status   SARS Coronavirus 2 by RT PCR NEGATIVE NEGATIVE Final    Comment: (NOTE) SARS-CoV-2 target nucleic acids are NOT DETECTED.  The SARS-CoV-2 RNA is generally detectable in upper respiratory specimens during the acute phase of infection. The lowest concentration of SARS-CoV-2 viral copies this assay can detect is 138 copies/mL. A negative result does not preclude SARS-Cov-2 infection and should not be used as the sole basis for treatment or other patient management decisions. A  negative result may occur with  improper specimen collection/handling, submission of specimen other than nasopharyngeal swab, presence of viral mutation(s) within the areas targeted by this assay, and inadequate number of viral copies(<138 copies/mL). A negative result must be combined with clinical observations, patient history, and epidemiological information. The expected result is Negative.  Fact Sheet for Patients:  EntrepreneurPulse.com.au  Fact Sheet for Healthcare Providers:  IncredibleEmployment.be  This test is no t yet approved or cleared by the [place] FDA and  has been authorized for detection and/or diagnosis of SARS-CoV-2 by FDA under an Emergency Use Authorization (EUA). This EUA will remain  in effect (meaning this test can be used) for the duration of the COVID-19 declaration under Section 564(b)(1) of the Act, 21 U.S.C.section 360bbb-3(b)(1), unless the authorization is terminated  or revoked sooner.       Influenza A by PCR NEGATIVE NEGATIVE Final   Influenza B by PCR NEGATIVE NEGATIVE Final    Comment: (NOTE) The Xpert Xpress SARS-CoV-2/FLU/RSV plus assay is intended as an aid in the diagnosis of influenza from Nasopharyngeal swab specimens and should not be used as a sole basis for treatment. Nasal washings and aspirates are unacceptable for Xpert Xpress SARS-CoV-2/FLU/RSV testing.  Fact Sheet for Patients: EntrepreneurPulse.com.au  Fact Sheet for Healthcare Providers: IncredibleEmployment.be  This test is not yet approved or cleared by the [place] FDA and has been authorized for detection and/or diagnosis of SARS-CoV-2 by FDA under an Emergency Use Authorization (EUA). This EUA will remain in effect (meaning this test can be used) for the duration of the COVID-19 declaration under Section 564(b)(1) of the Act, 21 U.S.C. section 360bbb-3(b)(1), unless the authorization  is terminated or revoked.  Performed at [facility], [address] [place], [place] [address]   Urine Culture     Status: None   Collection Time: 04/15/21  8:15 AM   Specimen: In/Out Cath Urine  Result Value Ref Range Status   Specimen Description   Final    IN/OUT CATH URINE Performed at [facility], [address], [place], [place] [address]    Special Requests   Final    NONE Performed at [facility], [address], [place], [place] [address]    Culture   Final    NO GROWTH Performed at [facility], [place] [address], [place], [place] [address]    Report Status 04/16/2021 FINAL  Final  Aerobic/Anaerobic Culture w Gram Stain (surgical/deep wound)     Status: None (Preliminary result)   Collection Time: 04/24/21  2:26 PM   Specimen: PATH Other; Tissue  Result Value Ref Range Status   Specimen Description   Final    ABSCESS Performed at [facility], [address] [place], [place] [address]    Special Requests   Final    PELVIC ABSCESS Performed at [facility], [address] [place], [place] [address]    Gram Stain   Final    FEW WBC PRESENT, PREDOMINANTLY MONONUCLEAR FEW GRAM POSITIVE RODS RARE GRAM NEGATIVE RODS    Culture   Final    TOO YOUNG TO READ Performed at [facility], [place] [address], [place], [place] [address]    Report Status PENDING  Incomplete         Radiology Studies: DG Abd 1 View  Result Date: 04/23/2021 CLINICAL DATA:  Nasogastric tube placement. EXAM: ABDOMEN - 1 VIEW COMPARISON:  CT abdomen pelvis 04/18/2021 FINDINGS: Nasogastric tube coursing below the hemidiaphragm with tip and side port overlying the expected region the gastric lumen. Gaseous dilatation of several loops of small bowel. No radio-opaque calculi or other significant radiographic abnormality are seen. Visualized lungs are grossly unremarkable. Kyphoplasty noted. Accessed right chest port Port-A-Cath with  tip overlying the right atrium. IMPRESSION: 1. Enteric tube in good position. 2. Gaseous dilatation of the small bowel. Findings may represent small bowel obstruction versus ileus. Electronically Signed   By: Iven Finn M.D.   On: 04/23/2021 16:10   DG Abd 1 View  Result Date: 04/23/2021 CLINICAL DATA:  NG tube placement. EXAM: ABDOMEN - 1 VIEW COMPARISON:  Radiograph 04/22/2021 FINDINGS: The NG tube is not identified on this radiograph. It may have been pulled back or removed. The lung bases are clear. Persistent dilated  small bowel. IMPRESSION: NG tube is not identified on this radiograph. Electronically Signed   By: Marijo Sanes M.D.   On: 04/23/2021 12:14   DG Chest Port 1 View  Result Date: 04/23/2021 CLINICAL DATA:  Hemoptysis EXAM: PORTABLE CHEST 1 VIEW COMPARISON:  Portable exam 1120 hours compared to 01/15/2021 FINDINGS: RIGHT jugular Port-A-Cath with tip projecting over SVC. Tip of a tube projects 10.3 cm above carina, question endotracheal versus nasogastric tube. Heart size, mediastinal contours, and pulmonary vascularity. Linear subsegmental atelectasis at lung bases. Improved aeration versus previous exam. No pleural effusion or pneumothorax. Bones demineralized with prior vertebroplasty at thoracolumbar junction. Surgical clips LEFT axilla. IMPRESSION: Significantly improved aeration since previous exam with minimal bibasilar atelectasis. Tip of a tube projects 10.3 cm above carina, uncertain if represents an endotracheal or nasogastric tube. Electronically Signed   By: Lavonia Dana M.D.   On: 04/23/2021 11:53        Scheduled Meds:  benzocaine   Mouth/Throat BID   Chlorhexidine Gluconate Cloth  6 each Topical Daily   enoxaparin (LOVENOX) injection  40 mg Subcutaneous Q24H   insulin aspart  0-9 Units Subcutaneous Q6H   lidocaine-prilocaine   Topical Once   pantoprazole (PROTONIX) IV  40 mg Intravenous QHS   Continuous Infusions:  sodium chloride 10 mL/hr at 04/25/21 0556    acyclovir 265 mg (04/24/21 2126)   piperacillin-tazobactam (ZOSYN)  IV 3.375 g (04/25/21 0557)   TPN ADULT (ION) 75 mL/hr at 04/24/21 2007     LOS: 10 days    Time spent: 30 mins     Wyvonnia Dusky, MD [facility] Pager [phone]  If 7PM-7AM, please contact night-coverage 04/25/2021, 7:44 AM

## 2021-04-25 NOTE — Progress Notes (Signed)
PHARMACY - TOTAL PARENTERAL NUTRITION CONSULT NOTE   Indication: Prolonged ileus  Patient Measurements: Height: 5\' 2"  (157.5 cm) Weight: 47.2 kg (104 lb 0.9 oz) IBW/kg (Calculated) : 50.1 TPN AdjBW (KG): 51.7 Body mass index is 19.03 kg/m.  Assessment: 75 y.o. 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  Glucose / Insulin: BG/24h 104 - 143 > 148-165  Electrolytes: WNL Renal: SCr<1, stable Hepatic: LFTs wnl, TG wnl  Intake / Output; MIVF: no MIVF GI Imaging: 11/17 CT Abd: Sigmoid diverticulitis GI Surgeries / Procedures: no recent surgical interventions  Central access: 11/23/20 TPN start date: 04/19/21  Nutritional Goals: Goal TPN rate is 75 mL/hr (provides 93 g of protein and 1788 kcals per day)  RD Assessment: Estimated Needs Total Energy Estimated Needs: 1700-1900kcal/day Total Protein Estimated Needs: 85-95g/day Total Fluid Estimated Needs: 1.3-1.5L/day  Current Nutrition: NPO  Plan:  Continue TPN at 75 mL/hr at 1800  Total volume including overfill 1800 mL Nutritional Components: Amino acids (using 10% Travasol): 93.2 grams  Dextrose: 270 grams Lipids (using 20% SMOFlipid): 49.6 grams Total kCal: 1651 / 24h Electrolytes in TPN: Na 120 mEq/L, K 50 mEq/L, Ca 5 mEq/L, Mg 5 mEq/L, and Phos 5 > 10 mmol/L. Cl:Ac 1:1 Add standard MVI and trace elements to TPN Continue Sensitive q6h SSI and adjust as needed  Monitor TPN labs on Mon/Thurs, daily until stable  Oswald Hillock, PharmD, BCPS Clinical Pharmacist 04/25/2021 9:12 AM

## 2021-04-25 NOTE — Progress Notes (Signed)
Patient ID: Mary Washington, female   DOB: 04/25/1946, 75 y.o.   MRN: [id number]     [place] Hospital Day(s): 10.   Interval History: Patient seen and examined, no acute events or new complaints overnight. Patient reports feeling okay.  She endorses that her pain is controlled with current pain medications.  She denies passing any gas through the colostomy.  She basically complains of feeling thirsty.  Vital signs in last 24 hours: [min-max] current  Temp:  [97.5 F (36.4 C)-98.6 F (37 C)] 97.6 F (36.4 C) (11/24 0929) Pulse Rate:  [98-117] 113 (11/24 0929) Resp:  [14-21] 18 (11/24 0929) BP: (103-141)/(50-66) 132/54 (11/24 0929) SpO2:  [97 %-100 %] 98 % (11/24 0929) Weight:  [47.2 kg] 47.2 kg (11/24 0550)     Height: 5\' 2"  (157.5 cm) Weight: 47.2 kg BMI (Calculated): 19.03   Physical Exam:  Constitutional: alert, cooperative and no distress  Respiratory: breathing non-labored at rest  Cardiovascular: regular rate and sinus rhythm  Gastrointestinal: soft, non-tender, and non-distended.  Colostomy pink and patent  Labs:  CBC Latest Ref Rng & Units 04/25/2021 04/24/2021 04/23/2021  WBC 4.0 - 10.5 K/uL 12.3(H) 9.2 8.0  Hemoglobin 12.0 - 15.0 g/dL 10.2(L) 10.7(L) 10.8(L)  Hematocrit 36.0 - 46.0 % 31.8(L) 32.8(L) 33.3(L)  Platelets 150 - 400 K/uL 556(H) 577(H) 521(H)   CMP Latest Ref Rng & Units 04/25/2021 04/24/2021 04/23/2021  Glucose 70 - 99 mg/dL 165(H) 122(H) 141(H)  BUN 8 - 23 mg/dL 25(H) 19 17  Creatinine 0.44 - 1.00 mg/dL 0.57 0.48 0.46  Sodium 135 - 145 mmol/L 138 135 130(L)  Potassium 3.5 - 5.1 mmol/L 4.0 3.7 3.5  Chloride 98 - 111 mmol/L 108 102 97(L)  CO2 22 - 32 mmol/L 26 27 25   Calcium 8.9 - 10.3 mg/dL 8.5(L) 8.8(L) 8.6(L)  Total Protein 6.5 - 8.1 g/dL 4.9(L) - -  Total Bilirubin 0.3 - 1.2 mg/dL 0.3 - -  Alkaline Phos 38 - 126 U/L 63 - -  AST 15 - 41 U/L 17 - -  ALT 0 - 44 U/L 13 - -    Imaging studies: No new pertinent imaging  studies   Assessment/Plan:  75 y.o. female with perforated diverticulitis with pelvic abscess and small bowel obstruction 1 Day Post-Op s/p partial colectomy with end colostomy creation and small bowel resection with anastomosis, complicated by pertinent comorbidities including diet and B-cell lymphoma on chemotherapy.  Patient has been recovering adequately.  No sign of complication at this moment.  She has expected tachycardia.  NGT with adequate output.  Colostomy with edema.  I will expect postoperative ileus.  We will continue with IV antibiotic therapy due to pelvic abscess.  Continue TPN.  Encourage the patient to get out of bed and ambulate.  [name], MD

## 2021-04-26 DIAGNOSIS — K56609 Unspecified intestinal obstruction, unspecified as to partial versus complete obstruction: Secondary | ICD-10-CM | POA: Diagnosis not present

## 2021-04-26 DIAGNOSIS — K5720 Diverticulitis of large intestine with perforation and abscess without bleeding: Principal | ICD-10-CM

## 2021-04-26 DIAGNOSIS — C8581 Other specified types of non-Hodgkin lymphoma, lymph nodes of head, face, and neck: Secondary | ICD-10-CM | POA: Diagnosis not present

## 2021-04-26 DIAGNOSIS — K5792 Diverticulitis of intestine, part unspecified, without perforation or abscess without bleeding: Secondary | ICD-10-CM | POA: Diagnosis not present

## 2021-04-26 DIAGNOSIS — N739 Female pelvic inflammatory disease, unspecified: Secondary | ICD-10-CM

## 2021-04-26 LAB — CBC WITH DIFFERENTIAL/PLATELET
Abs Immature Granulocytes: 0.75 10*3/uL — ABNORMAL HIGH (ref 0.00–0.07)
Basophils Absolute: 0.1 10*3/uL (ref 0.0–0.1)
Basophils Relative: 1 %
Eosinophils Absolute: 0.1 10*3/uL (ref 0.0–0.5)
Eosinophils Relative: 1 %
HCT: 28.5 % — ABNORMAL LOW (ref 36.0–46.0)
Hemoglobin: 9.1 g/dL — ABNORMAL LOW (ref 12.0–15.0)
Immature Granulocytes: 6 %
Lymphocytes Relative: 4 %
Lymphs Abs: 0.6 10*3/uL — ABNORMAL LOW (ref 0.7–4.0)
MCH: 29.4 pg (ref 26.0–34.0)
MCHC: 31.9 g/dL (ref 30.0–36.0)
MCV: 91.9 fL (ref 80.0–100.0)
Monocytes Absolute: 1.8 10*3/uL — ABNORMAL HIGH (ref 0.1–1.0)
Monocytes Relative: 14 %
Neutro Abs: 9.6 10*3/uL — ABNORMAL HIGH (ref 1.7–7.7)
Neutrophils Relative %: 74 %
Platelets: 494 10*3/uL — ABNORMAL HIGH (ref 150–400)
RBC: 3.1 MIL/uL — ABNORMAL LOW (ref 3.87–5.11)
RDW: 17.1 % — ABNORMAL HIGH (ref 11.5–15.5)
Smear Review: NORMAL
WBC: 12.9 10*3/uL — ABNORMAL HIGH (ref 4.0–10.5)
nRBC: 0 % (ref 0.0–0.2)

## 2021-04-26 LAB — GLUCOSE, CAPILLARY
Glucose-Capillary: 134 mg/dL — ABNORMAL HIGH (ref 70–99)
Glucose-Capillary: 148 mg/dL — ABNORMAL HIGH (ref 70–99)
Glucose-Capillary: 166 mg/dL — ABNORMAL HIGH (ref 70–99)
Glucose-Capillary: 166 mg/dL — ABNORMAL HIGH (ref 70–99)
Glucose-Capillary: 171 mg/dL — ABNORMAL HIGH (ref 70–99)

## 2021-04-26 MED ORDER — TRAVASOL 10 % IV SOLN
INTRAVENOUS | Status: AC
  Filled 2021-04-26: qty 936

## 2021-04-26 NOTE — Evaluation (Signed)
Physical Therapy Evaluation Patient Details Name: Mary Washington MRN: [id number] DOB: 07/15/1945 Today's Date: 04/26/2021  History of Present Illness  Pt admitted for acute diverticulitis with pelvic abscess and SBO. Pt is now s/p partial colectomy. 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  Clinical Impression  Pt is a pleasant 75 year old female who was admitted for diverticulitis and is s/p partial colectomy. Pt on NG tube to suctions, however per orders able to be clamped for mobility. All mobility performed on RA with sats at 97-98%. HR increased to 122bpm with exertion. Pt performs bed mobility, transfers, and ambulation with cga and RW. Pt demonstrates deficits with strength/endurance/mobility. Would benefit from skilled PT to address above deficits and promote optimal return to PLOF. Recommend transition to [place] upon discharge from acute hospitalization.      Recommendations for follow up therapy are one component of a multi-disciplinary discharge planning process, led by the attending physician.  Recommendations may be updated based on patient status, additional functional criteria and insurance authorization.  Follow Up Recommendations Home health PT    Assistance Recommended at Discharge Set up Supervision/Assistance  Functional Status Assessment Patient has had a recent decline in their functional status and demonstrates the ability to make significant improvements in function in a reasonable and predictable amount of time.  Equipment Recommendations  None recommended by PT    Recommendations for Other Services       Precautions / Restrictions Precautions Precautions: Fall Restrictions Weight Bearing Restrictions: No      Mobility  Bed Mobility Overal bed mobility: Needs Assistance Bed Mobility: Supine to Sit     Supine to sit: Min guard     General bed mobility comments: safe technique however does move slowly    Transfers Overall transfer level:  Needs assistance Equipment used: Rolling walker (2 wheels) Transfers: Sit to/from Stand Sit to Stand: Min guard           General transfer comment: cues for pushing from seated surface. once standing, RW used. Initially braces back of legs against bed    Ambulation/Gait Ambulation/Gait assistance: Min guard Gait Distance (Feet): 100 Feet Assistive device: Rolling walker (2 wheels) Gait Pattern/deviations: Step-through pattern       General Gait Details: ambulated with slow gait speed while using RW. Fatigues with increased distance  Stairs            Wheelchair Mobility    Modified Rankin (Stroke Patients Only)       Balance Overall balance assessment: Needs assistance Sitting-balance support: Feet supported Sitting balance-Leahy Scale: Good     Standing balance support: Bilateral upper extremity supported Standing balance-Leahy Scale: Good                               Pertinent Vitals/Pain Pain Assessment: No/denies pain    Home Living Family/patient expects to be discharged to:: Private residence Living Arrangements: Spouse/significant other Available Help at Discharge: Family Type of Home: House Home Access: Stairs to enter   Technical brewer of Steps: 1   Home Layout: One level Home Equipment: Conservation officer, nature (2 wheels);Cane - single point Additional Comments: have access to [facility] if needed    Prior Function Prior Level of Function : Independent/Modified Independent             Mobility Comments: was using SPC/RW PRN       Hand Dominance        Extremity/Trunk Assessment  Upper Extremity Assessment Upper Extremity Assessment: Generalized weakness (B UE grossly 4/5)    Lower Extremity Assessment Lower Extremity Assessment: Generalized weakness (B LE grossly 4/5)       Communication   Communication: No difficulties  Cognition Arousal/Alertness: Awake/alert Behavior During Therapy: Flat affect Overall  Cognitive Status: Within Functional Limits for tasks assessed                                          General Comments      Exercises     Assessment/Plan    PT Assessment Patient needs continued PT services  PT Problem List Decreased strength;Decreased activity tolerance;Decreased balance;Cardiopulmonary status limiting activity;Decreased mobility       PT Treatment Interventions Gait training;Therapeutic exercise;Balance training    PT Goals (Current goals can be found in the Care Plan section)  Acute Rehab PT Goals Patient Stated Goal: to get stronger PT Goal Formulation: With patient Time For Goal Achievement: 05/10/21 Potential to Achieve Goals: Good    Frequency Min 2X/week   Barriers to discharge        Co-evaluation               AM-PAC PT "6 Clicks" Mobility  Outcome Measure Help needed turning from your back to your side while in a flat bed without using bedrails?: A Little Help needed moving from lying on your back to sitting on the side of a flat bed without using bedrails?: A Little Help needed moving to and from a bed to a chair (including a wheelchair)?: A Little Help needed standing up from a chair using your arms (e.g., wheelchair or bedside chair)?: A Little Help needed to walk in hospital room?: A Little Help needed climbing 3-5 steps with a railing? : A Little 6 Click Score: 18    End of Session   Activity Tolerance: Patient limited by fatigue Patient left: in chair Nurse Communication: Mobility status PT Visit Diagnosis: Unsteadiness on feet (R26.81);Muscle weakness (generalized) (M62.81);Difficulty in walking, not elsewhere classified (R26.2)    Time: 3646-8032 PT Time Calculation (min) (ACUTE ONLY): 26 min   Charges:   PT Evaluation $PT Eval Low Complexity: 1 Low PT Treatments $Gait Training: 8-22 mins        Greggory Stallion, PT, DPT [phone]   Mary Washington 04/26/2021, 12:09 PM

## 2021-04-26 NOTE — Progress Notes (Signed)
Patient with ongoing with intermitted yellow MEWS. TPN ongoing without any issues. Required minimal insuline. Got up to bedside commode with moderate assist. Only want Tylenol due to morphine causing lethargy. Patient reports tylenol effective. Using pillow to support abd with movement. Surgical incision  with out any drainage. No additional output from colostomy. Will endorse.

## 2021-04-26 NOTE — Progress Notes (Signed)
PHARMACY - TOTAL PARENTERAL NUTRITION CONSULT NOTE   Indication: Prolonged ileus  Patient Measurements: Height: 5\' 2"  (157.5 cm) Weight: 49.3 kg (108 lb 11 oz) IBW/kg (Calculated) : 50.1 TPN AdjBW (KG): 51.7 Body mass index is 19.88 kg/m.  Assessment: 75 y.o. 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  Glucose / Insulin: BG/24h 131-166  Electrolytes: WNL Renal: SCr<1, stable Hepatic: LFTs wnl, TG wnl  Intake / Output; MIVF: no MIVF GI Imaging: 11/17 CT Abd: Sigmoid diverticulitis GI Surgeries / Procedures: no recent surgical interventions  Central access: 11/23/20 TPN start date: 04/19/21  Nutritional Goals: Goal TPN rate is 75 mL/hr (provides 93 g of protein and 1788 kcals per day)  RD Assessment: Estimated Needs Total Energy Estimated Needs: 1700-1900kcal/day Total Protein Estimated Needs: 85-95g/day Total Fluid Estimated Needs: 1.3-1.5L/day  Current Nutrition: NPO  Plan:  Continue TPN at 75 mL/hr at 1800  Total volume including overfill 1800 mL Nutritional Components: Amino acids (using 10% Travasol): 93.2 grams  Dextrose: 270 grams Lipids (using 20% SMOFlipid): 49.6 grams Total kCal: 1651 / 24h Electrolytes in TPN: Na 120 mEq/L, K 50 mEq/L, Ca 5 mEq/L, Mg 5 mEq/L, and Phos 5 > 10 mmol/L. Cl:Ac 1:1 Add standard MVI and trace elements to TPN Continue Sensitive q6h SSI and adjust as needed  Monitor TPN labs on Mon/Thurs, daily until stable  Pearla Dubonnet, PharmD Clinical Pharmacist 04/26/2021 12:08 PM

## 2021-04-26 NOTE — Consult Note (Signed)
NAMETresa Washington  DOB: 04-28-1946  MRN: [id number]  Date/Time: 04/26/2021 4:13 PM  REQUESTING PROVIDER: Dr.Williams Subjective:  REASON FOR CONSULT: intra abdominal infection ? Mary Washington is a 75 y.o. 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 Pt was diagnosed with lymphoa in June 2022, her last chemo was on 04/08/21. She developed constipation after that and took miralax for a few days. Then her oncologist gave her suppository and lactulose. The latter caused loose stools, but she was feeling bloated with discomfort and came to the ED. She did not have any fever or abdominal apin. Some nausea Vitals in the ED 139/55, temp 99.8, HR 110, RR 16. WBC 0.3, HB 11.3, PLT 134 and cr 0.49 CT abdomen showed acute sigmoid diverticulitis with bowel perf and early abscess formation- she was started on zosyn- because of neutropenia the surgeons wanted to manage this conservatively until her wbc recovered- on 11/23 she was taken to the OR and underwent sigmoid colon resection with colostomy, Small bowel resection with anastomosis for SBO and excision of mesenteric mass. I am asked to see the patient for antibiotic management    Past Medical History:  Diagnosis Date   Colon polyps    Diverticulosis    Melanoma ([place]) 1988   lt upper arm, some lymph nodes removed   Mild intermittent asthma    Osteoporosis     Past Surgical History:  Procedure Laterality Date   BACK SURGERY     CATARACT EXTRACTION     IR IMAGING GUIDED PORT INSERTION  11/23/2020   PARTIAL COLECTOMY N/A 04/24/2021   Procedure: PARTIAL COLECTOMY;  Surgeon: Herbert Pun, MD;  Location: [facility] ORS;  Service: General;  Laterality: N/A;   TONSILLECTOMY     VAGINAL HYSTERECTOMY  1990    Social History   Socioeconomic History   Marital status: Married    Spouse name: Mikki Santee   Number of children: 2   Years of education: Not on  file   Highest education level: Not on file  Occupational History   Not on file  Tobacco Use   Smoking status: Never   Smokeless tobacco: Never  Substance and Sexual Activity   Alcohol use: Never   Drug use: Never   Sexual activity: Yes    Partners: Male  Other Topics Concern   Not on file  Social History Narrative   ** Merged History Encounter **       Lives at home with husband.    Social Determinants of Health   Financial Resource Strain: Not on file  Food Insecurity: Not on file  Transportation Needs: Not on file  Physical Activity: Not on file  Stress: Not on file  Social Connections: Not on file  Intimate Partner Violence: Not on file    Family History  Problem Relation Age of Onset   Breast cancer Neg Hx    No Known Allergies I? Current Facility-Administered Medications  Medication Dose Route Frequency Provider Last Rate Last Admin   0.9 %  sodium chloride infusion   Intravenous PRN Herbert Pun, MD 10 mL/hr at 04/25/21 0556 New Bag at 04/25/21 0556   acetaminophen (TYLENOL) tablet 500 mg  500 mg Oral Q6H PRN Herbert Pun, MD   500 mg at 04/26/21 1154   acyclovir (ZOVIRAX) 265 mg in dextrose 5 % 100 mL IVPB  5 mg/kg Intravenous Q12H Herbert Pun, MD 105.3 mL/hr at 04/26/21 1017  265 mg at 04/26/21 1017   benzocaine (ORAJEL) 10 % mucosal gel   Mouth/Throat BID Herbert Pun, MD   Given at 04/26/21 1021   Chlorhexidine Gluconate Cloth 2 % PADS 6 each  6 each Topical Daily Herbert Pun, MD   6 each at 04/26/21 1029   enoxaparin (LOVENOX) injection 40 mg  40 mg Subcutaneous Q24H Herbert Pun, MD   40 mg at 04/25/21 1743   insulin aspart (novoLOG) injection 0-9 Units  0-9 Units Subcutaneous Q6H Herbert Pun, MD   2 Units at 04/26/21 1154   lidocaine-prilocaine (EMLA) cream   Topical Once Herbert Pun, MD       magic mouthwash  5 mL Oral QID PRN Herbert Pun, MD       menthol-cetylpyridinium  (CEPACOL) lozenge 3 mg  1 lozenge Oral PRN Herbert Pun, MD   3 mg at 04/17/21 2054   morphine 4 MG/ML injection 4 mg  4 mg Intravenous Q4H PRN Herbert Pun, MD       ondansetron (ZOFRAN) tablet 4 mg  4 mg Oral Q6H PRN Herbert Pun, MD       Or   ondansetron (ZOFRAN) injection 4 mg  4 mg Intravenous Q6H PRN Herbert Pun, MD   4 mg at 04/15/21 1950   pantoprazole (PROTONIX) injection 40 mg  40 mg Intravenous QHS Herbert Pun, MD   40 mg at 04/25/21 2151   piperacillin-tazobactam (ZOSYN) IVPB 3.375 g  3.375 g Intravenous Q8H Herbert Pun, MD 12.5 mL/hr at 04/26/21 1400 3.375 g at 04/26/21 1400   TPN ADULT (ION)   Intravenous Continuous TPN Oswald Hillock, RPH 75 mL/hr at 04/25/21 1754 New Bag at 04/25/21 1754   TPN ADULT (ION)   Intravenous Continuous TPN Rito Ehrlich A, RPH       Facility-Administered Medications Ordered in Other Encounters  Medication Dose Route Frequency Provider Last Rate Last Admin   sodium chloride flush (NS) 0.9 % injection 10 mL  10 mL Intravenous PRN [name], MD   10 mL at 01/07/21 0919     Abtx:  Anti-infectives (From admission, onward)    Start     Dose/Rate Route Frequency Ordered Stop   04/24/21 1244  piperacillin-tazobactam (ZOSYN) 3.375 GM/50ML IVPB       Note to Pharmacy: [facility]   : cabinet override      04/24/21 1244 04/25/21 0134   04/17/21 1200  acyclovir (ZOVIRAX) 265 mg in dextrose 5 % 100 mL IVPB        5 mg/kg  52.5 kg 105.3 mL/hr over 60 Minutes Intravenous Every 12 hours 04/17/21 1008     04/15/21 1400  piperacillin-tazobactam (ZOSYN) IVPB 3.375 g        3.375 g 12.5 mL/hr over 240 Minutes Intravenous Every 8 hours 04/15/21 1113     04/15/21 1100  acyclovir (ZOVIRAX) 200 MG capsule 400 mg  Status:  Discontinued       Note to Pharmacy: To prevent shingles     400 mg Oral 2 times daily 04/15/21 1051 04/17/21 1008   04/15/21 0700  ceFEPIme (MAXIPIME) 2 g in sodium  chloride 0.9 % 100 mL IVPB        2 g 200 mL/hr over 30 Minutes Intravenous  Once 04/15/21 0650 04/15/21 0836   04/15/21 0700  metroNIDAZOLE (FLAGYL) IVPB 500 mg        500 mg 100 mL/hr over 60 Minutes Intravenous  Once 04/15/21 0650 04/15/21 1024  REVIEW OF SYSTEMS:  Const: negative fever, negative chills, weight loss Eyes: negative diplopia or visual changes, negative eye pain ENT: negative coryza, negative sore throat Resp: negative cough, hemoptysis, dyspnea Cards: negative for chest pain, palpitations, lower extremity edema GU: negative for frequency, dysuria and hematuria GI: abdominal distension- has not passed flatus Skin: negative for rash and pruritus Heme: negative for easy bruising and gum/nose bleeding MS: weakness Neurolo:negative for headaches, dizziness, vertigo, memory problems  Psych: negative for feelings of anxiety, depression  Endocrine: negative for thyroid, diabetes Allergy/Immunology- negative for any medication or food allergies  Objective:  VITALS:  BP (!) 143/59 (BP Location: Left Arm)   Pulse 98   Temp 98 F (36.7 C) (Oral)   Resp 18   Ht 5\' 2"  (1.575 m)   Wt 49.3 kg   SpO2 100%   BMI 19.88 kg/m  PHYSICAL EXAM:  General: Alert, cooperative, frail, has NG tube Head: Normocephalic, without obvious abnormality, atraumatic. Eyes: Conjunctivae clear, anicteric sclerae. Pupils are equal ENT Nares normal. No drainage or sinus tenderness.NG tube Lips, mucosa, and tongue normal. No Thrush Neck: Supple, symmetrical, no adenopathy, thyroid: non tender no carotid bruit and no JVD. Back: No CVA tenderness. Lungs: b/l air entry- decreased bases Heart: s1s2 PORT on the rt chest wall Abdomen: Soft, distended. Bowel sounds sluggish -colostomy Extremities: atraumatic, no cyanosis. No edema. No clubbing Skin: No rashes or lesions. Or bruising Lymph: Cervical, supraclavicular normal. Neurologic: Grossly non-focal Pertinent Labs Lab Results CBC     Component Value Date/Time   WBC 12.9 (H) 04/26/2021 0610   RBC 3.10 (L) 04/26/2021 0610   HGB 9.1 (L) 04/26/2021 0610   HCT 28.5 (L) 04/26/2021 0610   PLT 494 (H) 04/26/2021 0610   MCV 91.9 04/26/2021 0610   MCH 29.4 04/26/2021 0610   MCHC 31.9 04/26/2021 0610   RDW 17.1 (H) 04/26/2021 0610   LYMPHSABS 0.6 (L) 04/26/2021 0610   MONOABS 1.8 (H) 04/26/2021 0610   EOSABS 0.1 04/26/2021 0610   BASOSABS 0.1 04/26/2021 0610    CMP Latest Ref Rng & Units 04/25/2021 04/24/2021 04/23/2021  Glucose 70 - 99 mg/dL 165(H) 122(H) 141(H)  BUN 8 - 23 mg/dL 25(H) 19 17  Creatinine 0.44 - 1.00 mg/dL 0.57 0.48 0.46  Sodium 135 - 145 mmol/L 138 135 130(L)  Potassium 3.5 - 5.1 mmol/L 4.0 3.7 3.5  Chloride 98 - 111 mmol/L 108 102 97(L)  CO2 22 - 32 mmol/L 26 27 25   Calcium 8.9 - 10.3 mg/dL 8.5(L) 8.8(L) 8.6(L)  Total Protein 6.5 - 8.1 g/dL 4.9(L) - -  Total Bilirubin 0.3 - 1.2 mg/dL 0.3 - -  Alkaline Phos 38 - 126 U/L 63 - -  AST 15 - 41 U/L 17 - -  ALT 0 - 44 U/L 13 - -      Microbiology: Recent Results (from the past 240 hour(s))  Aerobic/Anaerobic Culture w Gram Stain (surgical/deep wound)     Status: None (Preliminary result)   Collection Time: 04/24/21  2:26 PM   Specimen: PATH Other; Tissue  Result Value Ref Range Status   Specimen Description   Final    ABSCESS Performed at [facility], [address] [place], [place] [address]    Special Requests   Final    PELVIC ABSCESS Performed at [facility], [address] [place], [place] [address]    Gram Stain   Final    FEW WBC PRESENT, PREDOMINANTLY MONONUCLEAR FEW GRAM POSITIVE RODS RARE GRAM NEGATIVE  RODS    Culture   Final    RARE GRAM NEGATIVE RODS CULTURE REINCUBATED FOR BETTER GROWTH Performed at [facility], [place] [address], [place], [place] [address]    Report Status PENDING  Incomplete   04/15/21- BC- NG IMAGING RESULTS: CT abdomen/pelvis from 11/17 Sigmoid diverticulitis. There are  few loculated fluid collections in the pelvis some of which appear larger in size. Findings suggest small pericolic abscesses related to acute diverticulitis.   There is abnormal dilation of proximal small bowel loops measuring up to 4.5 cm in diameter with interval worsening. This may be due to adhesions or internal hernia. Level of obstruction appears to be in the lower mid abdomen or upper pelvis. Distal small bowel loops are not dilated. I have personally reviewed the films ? Impression/Recommendation ? ?Perforated sigmoid diverticulitis with pelvic abscesses- s/p lapartomy, resection and colostomy Small bowel obstruction due to adherence of loop to pelvic abscess- s/p small segment of small bowel resected  Mesenteric mass- resected  Pt has not had bowel function established yet- has NG tube  Continue IV zosyn  Neutropenia following chemo- has resolved  Anemia  B cell lymphoma- last chemo on 04/08/21- vincristine, cyclophophamide, doxorubicin, rituximab andprednisone Getting IV acyclovir as prophylaxis ' ?ID will follow the patient peripherally this weekend- call if needed ___________________________________________________ Discussed with patient, requesting provider Note:  This document was prepared using Dragon voice recognition software and may include unintentional dictation errors.

## 2021-04-26 NOTE — Evaluation (Signed)
Occupational Therapy Evaluation Patient Details Name: Mary Washington MRN: [id number] DOB: [birth year] Today's Date: 04/26/2021   History of Present Illness Pt admitted for acute diverticulitis with pelvic abscess and SBO. Pt is now s/p partial colectomy. 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   Clinical Impression   Pt was seen for OT evaluation this date. Prior to hospital admission, pt was mod indep with most ADL and IADL such as light meal prep. Pt reports spouse helps with getting out of the shower and drying her feet. Currently pt demonstrates impairments as described below (See OT problem list) which functionally limit her ability to perform ADL/self-care tasks. Pt currently requires PRN MIN A for LB ADL tasks, CGA for ADL transfers, and requires rest breaks to support safety and recovery, HR up to 130 getting from EOB to [facility] using step pivot. SpO2 >97% on room air. Pt instructed in home/routines modifications, falls prevention, and energy conservation strategies to maximize safety and independence in meaningful occupations as well as the "have to do" activities. Pt verbalized understanding and eager to get back to doing things she used to such as playing cards with friends and going out to eat. Pt would benefit from skilled OT services to address noted impairments and functional limitations (see below for any additional details) in order to maximize safety and independence while minimizing falls risk and caregiver burden. Upon hospital discharge, recommend HHOT to maximize pt safety and return to functional independence during meaningful occupations of daily life.     Recommendations for follow up therapy are one component of a multi-disciplinary discharge planning process, led by the attending physician.  Recommendations may be updated based on patient status, additional functional criteria and insurance authorization.   Follow Up Recommendations  Home health OT    Assistance  Recommended at Discharge PRN  Functional Status Assessment  Patient has had a recent decline in their functional status and demonstrates the ability to make significant improvements in function in a reasonable and predictable amount of time.  Equipment Recommendations  BSC/3in1    Recommendations for Other Services       Precautions / Restrictions Precautions Precautions: Fall Precaution Comments: monitor HR Restrictions Weight Bearing Restrictions: No Other Position/Activity Restrictions: abdominal incision, colostomy, NGtube - ask RN to clamp      Mobility Bed Mobility Overal bed mobility: Needs Assistance Bed Mobility: Supine to Sit;Sit to Supine     Supine to sit: Min guard;HOB elevated Sit to supine: Min guard;HOB elevated   General bed mobility comments: HOB 30*, CGA BLE mgt    Transfers Overall transfer level: Needs assistance Equipment used: None Transfers: Sit to/from Stand;Bed to chair/wheelchair/BSC Sit to Stand: Min guard     Step pivot transfers: Min guard     General transfer comment: STS and step pivot from EOB to/from [facility] with bed rail/BSC arm rest use for stability      Balance Overall balance assessment: Needs assistance Sitting-balance support: Feet supported;Bilateral upper extremity supported Sitting balance-Leahy Scale: Fair     Standing balance support: Single extremity supported Standing balance-Leahy Scale: Fair                             ADL either performed or assessed with clinical judgement   ADL Overall ADL's : Needs assistance/impaired  General ADL Comments: Pt currently requires PRN MIN A for LB ADL tasks from seated position, CGA for ADL transfers, set up and lateral lean for toileting hygiene, and requires increased rest breaks as HR up to 130 with exertion.     Vision         Perception     Praxis      Pertinent Vitals/Pain Pain Assessment:  0-10 Pain Score: 2  Pain Location: low back in sitting Pain Descriptors / Indicators: Aching Pain Intervention(s): Limited activity within patient's tolerance;Monitored during session;Repositioned     Hand Dominance Right   Extremity/Trunk Assessment Upper Extremity Assessment Upper Extremity Assessment: Generalized weakness   Lower Extremity Assessment Lower Extremity Assessment: Generalized weakness       Communication Communication Communication: No difficulties   Cognition Arousal/Alertness: Awake/alert Behavior During Therapy: WFL for tasks assessed/performed Overall Cognitive Status: Within Functional Limits for tasks assessed                                       General Comments       Exercises Other Exercises Other Exercises: Pt instructed in home/routines modifications, falls prevention, and energy conservation strategies to maximize safety and independence in meaningful occupations as well as the "have to do" activities   Shoulder Instructions      Home Living Family/patient expects to be discharged to:: Private residence Living Arrangements: Spouse/significant other Available Help at Discharge: Family Type of Home: House Home Access: Stairs to enter Technical brewer of Steps: 1   Home Layout: One level     Bathroom Shower/Tub: Occupational psychologist: Standard     Home Equipment: Conservation officer, nature (2 wheels);Cane - single point;Shower seat   Additional Comments: have access to [facility] if needed      Prior Functioning/Environment Prior Level of Function : Independent/Modified Independent             Mobility Comments: was using SPC/RW PRN ADLs Comments: was beginning to get back to light meal prep, spouse assists with getting out of the shower and drying her feet otherwise independent with ADL, med mgt, hasn't driven since lymphoma dx        OT Problem List: Decreased activity tolerance;Decreased  strength;Pain;Decreased knowledge of use of DME or AE;Impaired balance (sitting and/or standing)      OT Treatment/Interventions: Self-care/ADL training;Therapeutic exercise;Therapeutic activities;Energy conservation;DME and/or AE instruction;Patient/family education;Balance training    OT Goals(Current goals can be found in the care plan section) Acute Rehab OT Goals Patient Stated Goal: get back to eating real food then go home OT Goal Formulation: With patient Time For Goal Achievement: 05/10/21 Potential to Achieve Goals: Good ADL Goals Pt Will Perform Lower Body Dressing: with modified independence;sit to/from stand Pt Will Transfer to Toilet: with modified independence;ambulating (LRAD for amb, elevated commode) Additional ADL Goal #1: Pt will verbalize plan to implement at least 2 learned ECS into daily ADL/IADL routine to support safe engagement in meaningful occupations.  OT Frequency: Min 2X/week   Barriers to D/C:            Co-evaluation              AM-PAC OT "6 Clicks" Daily Activity     Outcome Measure Help from another person eating meals?: None Help from another person taking care of personal grooming?: None Help from another person toileting, which includes using toliet, bedpan, or urinal?:  A Little Help from another person bathing (including washing, rinsing, drying)?: A Little Help from another person to put on and taking off regular upper body clothing?: None Help from another person to put on and taking off regular lower body clothing?: A Little 6 Click Score: 21   End of Session Nurse Communication:  (RN cleared pt to work with OT, no need to clamp NG tube per RN)  Activity Tolerance: Patient tolerated treatment well Patient left: in bed;with call bell/phone within reach;with bed alarm set  OT Visit Diagnosis: Other abnormalities of gait and mobility (R26.89);Muscle weakness (generalized) (M62.81)                Time: 7711-6579 OT Time Calculation  (min): 31 min Charges:  OT General Charges $OT Visit: 1 Visit OT Evaluation $OT Eval Moderate Complexity: 1 Mod OT Treatments $Self Care/Home Management : 23-37 mins  Ardeth Perfect., MPH, MS, OTR/L ascom [phone] 04/26/21, 3:50 PM

## 2021-04-26 NOTE — Progress Notes (Signed)
Patient ID: Mary Washington, female   DOB: 09-18-1945, 75 y.o.   MRN: [id number]     [place] [facility] Day(s): 11.   Interval History: Patient seen and examined, no acute events or new complaints overnight. Patient reports feeling thirsty.  Patient denies significant abdominal pain.  Pain controlled with Tylenol.  Patient denies passing gas through the colostomy.  Vital signs in last 24 hours: [min-max] current  Temp:  [97.9 F (36.6 C)-98.3 F (36.8 C)] 98 F (36.7 C) (11/25 0811) Pulse Rate:  [98-111] 98 (11/25 0811) Resp:  [16-20] 18 (11/25 0811) BP: (126-143)/(54-62) 143/59 (11/25 0811) SpO2:  [97 %-100 %] 100 % (11/25 0811) Weight:  [49.3 kg] 49.3 kg (11/25 0500)     Height: 5\' 2"  (157.5 cm) Weight: 49.3 kg BMI (Calculated): 19.87   Physical Exam:  Constitutional: alert, cooperative and no distress  Respiratory: breathing non-labored at rest  Cardiovascular: regular rate and sinus rhythm  Gastrointestinal: soft, non-tender, and non-distended.  Colostomy pink and patent.  Labs:  CBC Latest Ref Rng & Units 04/26/2021 04/25/2021 04/24/2021  WBC 4.0 - 10.5 K/uL 12.9(H) 12.3(H) 9.2  Hemoglobin 12.0 - 15.0 g/dL 9.1(L) 10.2(L) 10.7(L)  Hematocrit 36.0 - 46.0 % 28.5(L) 31.8(L) 32.8(L)  Platelets 150 - 400 K/uL 494(H) 556(H) 577(H)   CMP Latest Ref Rng & Units 04/25/2021 04/24/2021 04/23/2021  Glucose 70 - 99 mg/dL 165(H) 122(H) 141(H)  BUN 8 - 23 mg/dL 25(H) 19 17  Creatinine 0.44 - 1.00 mg/dL 0.57 0.48 0.46  Sodium 135 - 145 mmol/L 138 135 130(L)  Potassium 3.5 - 5.1 mmol/L 4.0 3.7 3.5  Chloride 98 - 111 mmol/L 108 102 97(L)  CO2 22 - 32 mmol/L 26 27 25   Calcium 8.9 - 10.3 mg/dL 8.5(L) 8.8(L) 8.6(L)  Total Protein 6.5 - 8.1 g/dL 4.9(L) - -  Total Bilirubin 0.3 - 1.2 mg/dL 0.3 - -  Alkaline Phos 38 - 126 U/L 63 - -  AST 15 - 41 U/L 17 - -  ALT 0 - 44 U/L 13 - -    Imaging studies: No new pertinent imaging studies  Assessment/Plan:  75 y.o. female with  perforated diverticulitis with pelvic abscess and small bowel obstruction 2 Day Post-Op s/p partial colectomy with end colostomy creation and small bowel resection with anastomosis, complicated by pertinent comorbidities including diet and B-cell lymphoma on chemotherapy.  Patient today continue with adequate vital signs.  No fever.  She continue recovering adequately.  Still with postoperative ileus which is expected.  Wounds dry and clean.  Colostomy pink and patent.  We will continue with IV antibiotic therapy for at least 5 days postop.  He is okay to have ice chips.  Continue NG tube to low intermittent suction.  Encourage the patient to ambulate.  We will continue with DVT prophylaxis.  [name], MD

## 2021-04-26 NOTE — Care Management Important Message (Signed)
Important Message  Patient Details  Name: Mary Washington MRN: [id number] Date of Birth: Oct 09, 1945   Medicare Important Message Given:  Yes     Dannette Barbara 04/26/2021, 12:45 PM

## 2021-04-26 NOTE — Progress Notes (Addendum)
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 S/p sigmoid colon resection w/ colostomy, small bowel resection w/ anastomosis, excision of mesenteric mass & draining of multiple abscess. Mesenteric mass sent to pathology & pending. Continue on IV zosyn. Intra-abd abscess cx growing gram positive & neg rods. ID consulted. NPO, except ice chips and continue on TPN. General surg following and recs apprec   Leukocytosis: likely secondary to above. Continue on IV abxs   Normocytic anemia: likely secondary to recent chemo & lymphoma. No need for a transfusion currently   Thrombocytosis: etiology unclear. Will continue to monitor   Hyponatremia: resolved    Hypokalemia: within normal limits   Hx of diffuse large B-cell lymphoma: last chemo 2 weeks ago     DVT prophylaxis: lovenox  Code Status: full  Family Communication:  Disposition Plan: unclear at this time   Level of care: Telemetry Medical  Status is: Inpatient  Remains inpatient appropriate because: severity of illness as stated above    Consultants:  General surg Onco   Procedures:   Antimicrobials: zosyn   Subjective: Pt c/o dry mouth   Objective: Vitals:   04/25/21 1955 04/25/21 2200 04/26/21 0500 04/26/21 0604  BP: (!) 133/54 (!) 136/57  139/62  Pulse: (!) 111 (!) 102  (!) 108  Resp: 18 18  20   Temp: 97.9 F (36.6 C) 97.9 F (36.6 C)  98.3 F (36.8 C)  TempSrc: Oral Oral  Oral  SpO2: 99% 97%  98%  Weight:   49.3 kg   Height:        Intake/Output Summary (Last 24 hours) at 04/26/2021 0744 Last data filed at 04/26/2021 0600 Gross per 24 hour   Intake 3357.33 ml  Output 2305 ml  Net 1052.33 ml   Filed Weights   04/24/21 0500 04/25/21 0550 04/26/21 0500  Weight: 42.6 kg 47.2 kg 49.3 kg    Examination:  General exam: Appears calm but uncomfortable   Respiratory system: clear breath sounds b/l  Cardiovascular system: S1 & S2+. No rubs or clicks  Gastrointestinal system: Abd is soft, mild tenderness to palpation, ostomy bag present & hypoactive bowel sounds  Central nervous system: Alert and oriented. Moves all extremities  Psychiatry: Judgement and insight appear normal. Appropriate mood and affect     Data Reviewed: I have personally reviewed following labs and imaging studies  CBC: Recent Labs  Lab 04/22/21 0629 04/23/21 0500 04/24/21 0412 04/25/21 0440 04/26/21 0610  WBC 6.9 8.0 9.2 12.3* 12.9*  NEUTROABS 4.9 5.7 6.4 9.3* 9.6*  HGB 10.7* 10.8* 10.7* 10.2* 9.1*  HCT 32.5* 33.3* 32.8* 31.8* 28.5*  MCV 86.7 87.4 87.5 89.3 91.9  PLT 481* 521* 577* 556* 270*   Basic Metabolic Panel: Recent Labs  Lab 04/20/21 0550 04/21/21 0555 04/22/21 0629 04/23/21 0500 04/24/21 0412 04/25/21 0440  NA 126* 131* 132* 130* 135 138  K 3.3* 3.9 3.9 3.5 3.7 4.0  CL 92* 99 99 97* 102 108  CO2 25 27 26 25 27 26   GLUCOSE 123* 155* 128* 141* 122* 165*  BUN <5* 10 16 17 19  25*  CREATININE 0.30* 0.48 0.42* 0.46 0.48 0.57  CALCIUM 8.1* 8.4* 8.7* 8.6* 8.8* 8.5*  MG 1.6* 2.1 2.0  --  2.1 2.0  PHOS 2.6 3.4 3.5  --   --  3.0   GFR: Estimated Creatinine Clearance: 48 mL/min (by C-G formula based on SCr of 0.57 mg/dL). Liver Function Tests: Recent Labs  Lab 04/19/21 2023 04/20/21 0550 04/22/21 0629 04/25/21 0440  AST 24 21 21 17   ALT 13 13 12 13   ALKPHOS 73 68 67 63  BILITOT 1.1 0.6 0.4 0.3  PROT 5.7* 5.4* 5.5* 4.9*  ALBUMIN 3.0* 2.5* 2.8* 2.3*   No results for input(s): LIPASE, AMYLASE in the last 168 hours. No results for input(s): AMMONIA in the last 168 hours. Coagulation Profile: No results for input(s): INR,  PROTIME in the last 168 hours. Cardiac Enzymes: No results for input(s): CKTOTAL, CKMB, CKMBINDEX, TROPONINI in the last 168 hours. BNP (last 3 results) No results for input(s): PROBNP in the last 8760 hours. HbA1C: No results for input(s): HGBA1C in the last 72 hours. CBG: Recent Labs  Lab 04/25/21 0607 04/25/21 1159 04/25/21 1749 04/26/21 0021 04/26/21 0550  GLUCAP 148* 151* 131* 171* 148*   Lipid Profile: No results for input(s): CHOL, HDL, LDLCALC, TRIG, CHOLHDL, LDLDIRECT in the last 72 hours.  Thyroid Function Tests: No results for input(s): TSH, T4TOTAL, FREET4, T3FREE, THYROIDAB in the last 72 hours. Anemia Panel: No results for input(s): VITAMINB12, FOLATE, FERRITIN, TIBC, IRON, RETICCTPCT in the last 72 hours. Sepsis Labs: No results for input(s): PROCALCITON, LATICACIDVEN in the last 168 hours.  Recent Results (from the past 240 hour(s))  Aerobic/Anaerobic Culture w Gram Stain (surgical/deep wound)     Status: None (Preliminary result)   Collection Time: 04/24/21  2:26 PM   Specimen: PATH Other; Tissue  Result Value Ref Range Status   Specimen Description   Final    ABSCESS Performed at [facility], [address], [place], [place] [address]    Special Requests   Final    PELVIC ABSCESS Performed at [facility], [address] [place], [place] [address]    Gram Stain   Final    FEW WBC PRESENT, PREDOMINANTLY MONONUCLEAR FEW GRAM POSITIVE RODS RARE GRAM NEGATIVE RODS    Culture   Final    TOO YOUNG TO READ Performed at [facility], [place] [address] [place], [place] [address]    Report Status PENDING  Incomplete         Radiology Studies: No results found.      Scheduled Meds:  benzocaine   Mouth/Throat BID   Chlorhexidine Gluconate Cloth  6 each Topical Daily   enoxaparin (LOVENOX) injection  40 mg Subcutaneous Q24H   insulin aspart  0-9 Units Subcutaneous Q6H   lidocaine-prilocaine   Topical Once    pantoprazole (PROTONIX) IV  40 mg Intravenous QHS   Continuous Infusions:  sodium chloride 10 mL/hr at 04/25/21 0556   acyclovir 265 mg (04/25/21 2216)   piperacillin-tazobactam (ZOSYN)  IV 3.375 g (04/26/21 0552)   TPN ADULT (ION) 75 mL/hr at 04/25/21 1754     LOS: 11 days    Time spent: 25 mins     Wyvonnia Dusky, MD [facility] Pager [phone]  If 7PM-7AM, please contact night-coverage 04/26/2021, 7:44 AM

## 2021-04-26 NOTE — Consult Note (Signed)
[place] Nurse ostomy consult note Stoma type/location: LLQ end colostomy  Stomal assessment/size: 1 3/4" round, budded, Os at center Peristomal assessment: intact  Treatment options for stomal/peristomal skin: 2" skin barrier ring Output ; none, no flatus  Ostomy pouching: 2pc. 2 3/4" and 2" skin barrier ring Education provided with patient and her husband Explained role of ostomy nurse and creation of stoma  Explained stoma characteristics (budded, flush, color, texture, care) Demonstrated pouch change (cutting new skin barrier, measuring stoma, cleaning peristomal skin and stoma, use of barrier ring) Education on emptying when 1/3 to 1/2 full and how to empty Demonstrated "burping" flatus from pouch   Enrolled patient in [place] program: No  WOC Nurse will follow along with you for continued support with ostomy teaching and care [place] MSN, [place], [place], [place], [place]

## 2021-04-27 DIAGNOSIS — K5792 Diverticulitis of intestine, part unspecified, without perforation or abscess without bleeding: Secondary | ICD-10-CM | POA: Diagnosis not present

## 2021-04-27 DIAGNOSIS — D72828 Other elevated white blood cell count: Secondary | ICD-10-CM

## 2021-04-27 DIAGNOSIS — R000 Tachycardia, unspecified: Secondary | ICD-10-CM

## 2021-04-27 DIAGNOSIS — K56609 Unspecified intestinal obstruction, unspecified as to partial versus complete obstruction: Secondary | ICD-10-CM | POA: Diagnosis not present

## 2021-04-27 LAB — CBC WITH DIFFERENTIAL/PLATELET
Abs Immature Granulocytes: 0.48 10*3/uL — ABNORMAL HIGH (ref 0.00–0.07)
Basophils Absolute: 0.1 10*3/uL (ref 0.0–0.1)
Basophils Relative: 1 %
Eosinophils Absolute: 0 10*3/uL (ref 0.0–0.5)
Eosinophils Relative: 0 %
HCT: 28.6 % — ABNORMAL LOW (ref 36.0–46.0)
Hemoglobin: 9 g/dL — ABNORMAL LOW (ref 12.0–15.0)
Immature Granulocytes: 3 %
Lymphocytes Relative: 4 %
Lymphs Abs: 0.7 10*3/uL (ref 0.7–4.0)
MCH: 28.5 pg (ref 26.0–34.0)
MCHC: 31.5 g/dL (ref 30.0–36.0)
MCV: 90.5 fL (ref 80.0–100.0)
Monocytes Absolute: 1.8 10*3/uL — ABNORMAL HIGH (ref 0.1–1.0)
Monocytes Relative: 11 %
Neutro Abs: 12.5 10*3/uL — ABNORMAL HIGH (ref 1.7–7.7)
Neutrophils Relative %: 81 %
Platelets: 451 10*3/uL — ABNORMAL HIGH (ref 150–400)
RBC: 3.16 MIL/uL — ABNORMAL LOW (ref 3.87–5.11)
RDW: 17.1 % — ABNORMAL HIGH (ref 11.5–15.5)
WBC: 15.5 10*3/uL — ABNORMAL HIGH (ref 4.0–10.5)
nRBC: 0 % (ref 0.0–0.2)

## 2021-04-27 LAB — GLUCOSE, CAPILLARY
Glucose-Capillary: 162 mg/dL — ABNORMAL HIGH (ref 70–99)
Glucose-Capillary: 130 mg/dL — ABNORMAL HIGH (ref 70–99)
Glucose-Capillary: 142 mg/dL — ABNORMAL HIGH (ref 70–99)

## 2021-04-27 MED ORDER — ORAL CARE MOUTH RINSE
15.0000 mL | Freq: Two times a day (BID) | OROMUCOSAL | Status: DC
  Administered 2021-04-27 – 2021-04-28 (×2): 15 mL via OROMUCOSAL

## 2021-04-27 MED ORDER — CHLORHEXIDINE GLUCONATE 0.12 % MT SOLN
15.0000 mL | Freq: Two times a day (BID) | OROMUCOSAL | Status: DC
  Administered 2021-04-27 – 2021-04-29 (×6): 15 mL via OROMUCOSAL
  Filled 2021-04-27 (×7): qty 15

## 2021-04-27 MED ORDER — METOPROLOL TARTRATE 5 MG/5ML IV SOLN
5.0000 mg | Freq: Three times a day (TID) | INTRAVENOUS | Status: DC | PRN
  Administered 2021-04-27 – 2021-04-28 (×4): 5 mg via INTRAVENOUS
  Filled 2021-04-27 (×4): qty 5

## 2021-04-27 MED ORDER — TRAVASOL 10 % IV SOLN
INTRAVENOUS | Status: AC
  Filled 2021-04-27: qty 936

## 2021-04-27 NOTE — Progress Notes (Signed)
Patient ID: Swara Donze, female   DOB: Oct 28, 1945, 75 y.o.   MRN: [id number]     [facility] Hospital Day(s): 12.   Interval History: Patient seen and examined, no acute events or new complaints overnight. Patient reports feeling okay.  She continued feeling thirsty.  Denies nausea or vomiting.  Denies abdominal pain.  She was concerned about her last time that it was 100.0.  Vital signs in last 24 hours: [min-max] current  Temp:  [97.7 F (36.5 C)-100 F (37.8 C)] 100 F (37.8 C) (11/26 0746) Pulse Rate:  [86-132] 128 (11/26 0746) Resp:  [16-18] 16 (11/26 0746) BP: (142-151)/(57-63) 150/59 (11/26 0746) SpO2:  [95 %-96 %] 95 % (11/26 0746)     Height: 5\' 2"  (157.5 cm) Weight: 49.3 kg BMI (Calculated): 19.87   Physical Exam:  Constitutional: alert, cooperative and no distress  Respiratory: breathing non-labored at rest  Cardiovascular: Tachycardic Gastrointestinal: soft, non-tender, and non-distended.  Colostomy pink and patent  Labs:  CBC Latest Ref Rng & Units 04/27/2021 04/26/2021 04/25/2021  WBC 4.0 - 10.5 K/uL 15.5(H) 12.9(H) 12.3(H)  Hemoglobin 12.0 - 15.0 g/dL 9.0(L) 9.1(L) 10.2(L)  Hematocrit 36.0 - 46.0 % 28.6(L) 28.5(L) 31.8(L)  Platelets 150 - 400 K/uL 451(H) 494(H) 556(H)   CMP Latest Ref Rng & Units 04/25/2021 04/24/2021 04/23/2021  Glucose 70 - 99 mg/dL 165(H) 122(H) 141(H)  BUN 8 - 23 mg/dL 25(H) 19 17  Creatinine 0.44 - 1.00 mg/dL 0.57 0.48 0.46  Sodium 135 - 145 mmol/L 138 135 130(L)  Potassium 3.5 - 5.1 mmol/L 4.0 3.7 3.5  Chloride 98 - 111 mmol/L 108 102 97(L)  CO2 22 - 32 mmol/L 26 27 25   Calcium 8.9 - 10.3 mg/dL 8.5(L) 8.8(L) 8.6(L)  Total Protein 6.5 - 8.1 g/dL 4.9(L) - -  Total Bilirubin 0.3 - 1.2 mg/dL 0.3 - -  Alkaline Phos 38 - 126 U/L 63 - -  AST 15 - 41 U/L 17 - -  ALT 0 - 44 U/L 13 - -    Imaging studies: No new pertinent imaging studies   Assessment/Plan:  75 y.o. female with perforated diverticulitis with pelvic abscess and  small bowel obstruction 3 Day Post-Op s/p partial colectomy with end colostomy creation and small bowel resection with anastomosis, complicated by pertinent comorbidities including diet and B-cell lymphoma on chemotherapy.  Patient continue with slowly progress.  Still ostomy without air or stool.  This is expected postoperative ileus.  I will continue with NGT, TPN.  Patient mains as chips.  Agreed to continue antibiotic therapy.  Increase in temp and white blood cell count around things are coming from intra-abdominal infection.  This is too early to be forming an abscess.  Also her pelvic abscesses were drained and the abdominal cavity was not contaminated.  Leukocytosis may be reactive and temp from atelectasis.  I encouraged the patient to do incentive spirometer.  I also encouraged patient to get out of bed.  Appreciate ID recommendations.  I will continue to follow closely.  Appreciate hospitalist management of medical morbidities.  [name], MD

## 2021-04-27 NOTE — Progress Notes (Signed)
PHARMACY - TOTAL PARENTERAL NUTRITION CONSULT NOTE   Indication: Prolonged ileus  Patient Measurements: Height: 5\' 2"  (157.5 cm) Weight: 49.3 kg (108 lb 11 oz) IBW/kg (Calculated) : 50.1 TPN AdjBW (KG): 51.7 Body mass index is 19.88 kg/m.  Assessment: 75 y.o. 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  Glucose / Insulin: BG/24h 131-166  Electrolytes: WNL Renal: SCr<1, stable Hepatic: LFTs wnl, TG wnl  Intake / Output; MIVF: no MIVF GI Imaging: 11/17 CT Abd: Sigmoid diverticulitis GI Surgeries / Procedures: no recent surgical interventions  Central access: 11/23/20 TPN start date: 04/19/21  Nutritional Goals: Goal TPN rate is 75 mL/hr (provides 93 g of protein and 1788 kcals per day)  RD Assessment: Estimated Needs Total Energy Estimated Needs: 1700-1900kcal/day Total Protein Estimated Needs: 85-95g/day Total Fluid Estimated Needs: 1.3-1.5L/day  Current Nutrition: NPO  Plan:  Continue TPN at 75 mL/hr at 1800  Total volume including overfill 1800 mL Nutritional Components: Amino acids (using 10% Travasol): 93.2 grams  Dextrose: 270 grams Lipids (using 20% SMOFlipid): 49.6 grams Total kCal: 1651 / 24h Electrolytes in TPN: Na 120 mEq/L, K 50 mEq/L, Ca 5 mEq/L, Mg 5 mEq/L, and Phos 5 > 10 mmol/L. Cl:Ac 1:1 Add standard MVI and trace elements to TPN Continue Sensitive q6h SSI and adjust as needed  Monitor TPN labs on Mon/Thurs, daily until stable  Oswald Hillock, PharmD Clinical Pharmacist 04/27/2021 9:06 AM

## 2021-04-27 NOTE — Progress Notes (Signed)
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 S/p sigmoid colon resection w/ colostomy, small bowel resection w/ anastomosis, excision of mesenteric mass & draining of multiple abscess. Mesenteric mass sent to pathology & pending. Continue on IV zosyn as per ID. Intra-abd abscess cx growing e.coli. NPO, except ice chips and continue on TPN. General surg following & rec apprec.  Leukocytosis: likely secondary to above. Continue on IV abxs   Normocytic anemia: likely secondary to recent chemo & lymphoma. H&H are labile. No need for a transfusion currently   Thrombocytosis: etiology unclear. Will continue to monitor    Hyponatremia: resolved    Hypokalemia: WNL    Hx of diffuse large B-cell lymphoma: last chemo 2 weeks ago   Generalized weakness: PT/OT recs HH.     DVT prophylaxis: lovenox  Code Status: full  Family Communication:  Disposition Plan: unclear at this time   Level of care: Telemetry Medical  Status is: Inpatient  Remains inpatient appropriate because: severity of illness as stated above    Consultants:  General surg Onco   Procedures:   Antimicrobials: zosyn   Subjective: Pt c/o fatigue  Objective: Vitals:   04/26/21 1745 04/26/21 1940 04/27/21 0421 04/27/21 0605  BP:  (!) 151/63 (!) 145/63   Pulse: 86 (!) 117 (!) 132 (!) 125  Resp:  16 18   Temp:  97.7 F (36.5 C) 100 F (37.8 C)   TempSrc:  Oral Oral   SpO2:  95% 95%   Weight:      Height:        Intake/Output Summary (Last 24 hours) at 04/27/2021 0740 Last data filed at 04/27/2021 0528 Gross per 24 hour  Intake 1081.68 ml   Output 2000 ml  Net -918.32 ml   Filed Weights   04/24/21 0500 04/25/21 0550 04/26/21 0500  Weight: 42.6 kg 47.2 kg 49.3 kg    Examination:  General exam: Appears calm & comfortable  Respiratory system: clear breath sounds b/l  Cardiovascular system: S1/S2+. No rubs or clicks  Gastrointestinal system: Abd is soft, NT, ND & hypoactive bowel sounds  Central nervous system: Alert and oriented. Moves all extremities  Psychiatry: Judgement and insight appear normal. Appropriate mood and affect    Data Reviewed: I have personally reviewed following labs and imaging studies  CBC: Recent Labs  Lab 04/23/21 0500 04/24/21 0412 04/25/21 0440 04/26/21 0610 04/27/21 0428  WBC 8.0 9.2 12.3* 12.9* 15.5*  NEUTROABS 5.7 6.4 9.3* 9.6* 12.5*  HGB 10.8* 10.7* 10.2* 9.1* 9.0*  HCT 33.3* 32.8* 31.8* 28.5* 28.6*  MCV 87.4 87.5 89.3 91.9 90.5  PLT 521* 577* 556* 494* 254*   Basic Metabolic Panel: Recent Labs  Lab 04/21/21 0555 04/22/21 0629 04/23/21 0500 04/24/21 0412 04/25/21 0440  NA 131* 132* 130* 135 138  K 3.9 3.9 3.5 3.7 4.0  CL 99 99 97* 102 108  CO2 27 26 25 27 26   GLUCOSE 155* 128* 141* 122* 165*  BUN 10 16 17 19  25*  CREATININE 0.48 0.42* 0.46 0.48 0.57  CALCIUM 8.4* 8.7* 8.6* 8.8* 8.5*  MG 2.1 2.0  --  2.1 2.0  PHOS  3.4 3.5  --   --  3.0   GFR: Estimated Creatinine Clearance: 48 mL/min (by C-G formula based on SCr of 0.57 mg/dL). Liver Function Tests: Recent Labs  Lab 04/22/21 0629 04/25/21 0440  AST 21 17  ALT 12 13  ALKPHOS 67 63  BILITOT 0.4 0.3  PROT 5.5* 4.9*  ALBUMIN 2.8* 2.3*   No results for input(s): LIPASE, AMYLASE in the last 168 hours. No results for input(s): AMMONIA in the last 168 hours. Coagulation Profile: No results for input(s): INR, PROTIME in the last 168 hours. Cardiac Enzymes: No results for input(s): CKTOTAL, CKMB, CKMBINDEX, TROPONINI in the last 168 hours. BNP (last 3 results) No results for input(s): PROBNP in the last 8760  hours. HbA1C: No results for input(s): HGBA1C in the last 72 hours. CBG: Recent Labs  Lab 04/26/21 0550 04/26/21 1141 04/26/21 1717 04/26/21 2303 04/27/21 0610  GLUCAP 148* 166* 134* 166* 162*   Lipid Profile: No results for input(s): CHOL, HDL, LDLCALC, TRIG, CHOLHDL, LDLDIRECT in the last 72 hours.  Thyroid Function Tests: No results for input(s): TSH, T4TOTAL, FREET4, T3FREE, THYROIDAB in the last 72 hours. Anemia Panel: No results for input(s): VITAMINB12, FOLATE, FERRITIN, TIBC, IRON, RETICCTPCT in the last 72 hours. Sepsis Labs: No results for input(s): PROCALCITON, LATICACIDVEN in the last 168 hours.  Recent Results (from the past 240 hour(s))  Aerobic/Anaerobic Culture w Gram Stain (surgical/deep wound)     Status: None (Preliminary result)   Collection Time: 04/24/21  2:26 PM   Specimen: PATH Other; Tissue  Result Value Ref Range Status   Specimen Description   Final    ABSCESS Performed at [facility], [address] [place], [place] [address]    Special Requests   Final    PELVIC ABSCESS Performed at [facility], [address] [place], [place] [address]    Gram Stain   Final    FEW WBC PRESENT, PREDOMINANTLY MONONUCLEAR FEW GRAM POSITIVE RODS RARE GRAM NEGATIVE RODS    Culture   Final    RARE GRAM NEGATIVE RODS CULTURE REINCUBATED FOR BETTER GROWTH Performed at [facility], [place] [address], [place], [place] [address]    Report Status PENDING  Incomplete         Radiology Studies: No results found.      Scheduled Meds:  benzocaine   Mouth/Throat BID   Chlorhexidine Gluconate Cloth  6 each Topical Daily   enoxaparin (LOVENOX) injection  40 mg Subcutaneous Q24H   insulin aspart  0-9 Units Subcutaneous Q6H   lidocaine-prilocaine   Topical Once   pantoprazole (PROTONIX) IV  40 mg Intravenous QHS   Continuous Infusions:  sodium chloride Stopped (04/26/21 2323)   acyclovir Stopped (04/26/21 2229)    piperacillin-tazobactam (ZOSYN)  IV 3.375 g (04/27/21 0528)   TPN ADULT (ION) 75 mL/hr at 04/27/21 0207     LOS: 12 days    Time spent: 15 mins     Wyvonnia Dusky, MD [facility] Pager [phone]  If 7PM-7AM, please contact night-coverage 04/27/2021, 7:40 AM

## 2021-04-28 ENCOUNTER — Other Ambulatory Visit: Payer: Self-pay | Admitting: Nurse Practitioner

## 2021-04-28 DIAGNOSIS — K5792 Diverticulitis of intestine, part unspecified, without perforation or abscess without bleeding: Secondary | ICD-10-CM | POA: Diagnosis not present

## 2021-04-28 DIAGNOSIS — K56609 Unspecified intestinal obstruction, unspecified as to partial versus complete obstruction: Secondary | ICD-10-CM | POA: Diagnosis not present

## 2021-04-28 DIAGNOSIS — D72828 Other elevated white blood cell count: Secondary | ICD-10-CM | POA: Diagnosis not present

## 2021-04-28 LAB — CBC WITH DIFFERENTIAL/PLATELET
Abs Immature Granulocytes: 0.35 10*3/uL — ABNORMAL HIGH (ref 0.00–0.07)
Basophils Absolute: 0.1 10*3/uL (ref 0.0–0.1)
Basophils Relative: 1 %
Eosinophils Absolute: 0.1 10*3/uL (ref 0.0–0.5)
Eosinophils Relative: 1 %
HCT: 30.2 % — ABNORMAL LOW (ref 36.0–46.0)
Hemoglobin: 9.4 g/dL — ABNORMAL LOW (ref 12.0–15.0)
Immature Granulocytes: 3 %
Lymphocytes Relative: 4 %
Lymphs Abs: 0.5 10*3/uL — ABNORMAL LOW (ref 0.7–4.0)
MCH: 28.7 pg (ref 26.0–34.0)
MCHC: 31.1 g/dL (ref 30.0–36.0)
MCV: 92.1 fL (ref 80.0–100.0)
Monocytes Absolute: 1 10*3/uL (ref 0.1–1.0)
Monocytes Relative: 8 %
Neutro Abs: 10.8 10*3/uL — ABNORMAL HIGH (ref 1.7–7.7)
Neutrophils Relative %: 83 %
Platelets: 469 10*3/uL — ABNORMAL HIGH (ref 150–400)
RBC: 3.28 MIL/uL — ABNORMAL LOW (ref 3.87–5.11)
RDW: 17.1 % — ABNORMAL HIGH (ref 11.5–15.5)
WBC: 12.8 10*3/uL — ABNORMAL HIGH (ref 4.0–10.5)
nRBC: 0 % (ref 0.0–0.2)

## 2021-04-28 LAB — AEROBIC/ANAEROBIC CULTURE W GRAM STAIN (SURGICAL/DEEP WOUND)

## 2021-04-28 LAB — GLUCOSE, CAPILLARY
Glucose-Capillary: 117 mg/dL — ABNORMAL HIGH (ref 70–99)
Glucose-Capillary: 146 mg/dL — ABNORMAL HIGH (ref 70–99)
Glucose-Capillary: 147 mg/dL — ABNORMAL HIGH (ref 70–99)
Glucose-Capillary: 159 mg/dL — ABNORMAL HIGH (ref 70–99)
Glucose-Capillary: 159 mg/dL — ABNORMAL HIGH (ref 70–99)

## 2021-04-28 MED ORDER — TRAVASOL 10 % IV SOLN
INTRAVENOUS | Status: AC
  Filled 2021-04-28: qty 936

## 2021-04-28 NOTE — Progress Notes (Signed)
Physical Therapy Treatment Patient Details Name: Mary Washington MRN: [id number] DOB: 05/30/1946 Today's Date: 04/28/2021   History of Present Illness Pt admitted for acute diverticulitis with pelvic abscess and SBO. Pt is now s/p partial colectomy. 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    PT Comments    Patient agreeable to PT session. Mod I for supine to sit with increased time needed. Upon standing with min guard, patient reports she needs to go to the bathroom. Assisted to [facility], then proceeded to ambulate 120 feet with RW and min guard. Assisted patient with set up and clean up for her to brush her teeth. She will continue to benefit from skilled PT while here to improve strength, acitivty tolerance and functional independence.     Recommendations for follow up therapy are one component of a multi-disciplinary discharge planning process, led by the attending physician.  Recommendations may be updated based on patient status, additional functional criteria and insurance authorization.  Follow Up Recommendations  Home health PT     Assistance Recommended at Discharge Intermittent Supervision/Assistance  Equipment Recommendations  None recommended by PT    Recommendations for Other Services       Precautions / Restrictions Precautions Precaution Comments: mod fall Restrictions Weight Bearing Restrictions: No Other Position/Activity Restrictions: abdominal incision, colostomy, NGtube     Mobility  Bed Mobility Overal bed mobility: Modified Independent;Needs Assistance Bed Mobility: Supine to Sit;Sit to Supine     Supine to sit: Modified independent (Device/Increase time) Sit to supine: Min assist   General bed mobility comments: Min assist to bring LEs back up onto bed.    Transfers Overall transfer level: Modified independent Equipment used: Rolling walker (2 wheels) Transfers: Sit to/from Stand Sit to Stand: Modified independent (Device/Increase time)                 Ambulation/Gait Ambulation/Gait assistance: Min guard Gait Distance (Feet): 120 Feet Assistive device: Rolling walker (2 wheels) Gait Pattern/deviations: Step-through pattern;Decreased step length - right;Decreased step length - left Gait velocity: decr     General Gait Details: ambulated with slow gait speed while using RW. Fatigues with increased distance   Stairs             Wheelchair Mobility    Modified Rankin (Stroke Patients Only)       Balance Overall balance assessment: Needs assistance Sitting-balance support: Feet supported Sitting balance-Leahy Scale: Good     Standing balance support: Bilateral upper extremity supported;During functional activity;Reliant on assistive device for balance Standing balance-Leahy Scale: Fair                              Cognition Arousal/Alertness: Awake/alert Behavior During Therapy: WFL for tasks assessed/performed Overall Cognitive Status: Within Functional Limits for tasks assessed                                          Exercises      General Comments        Pertinent Vitals/Pain Pain Assessment: No/denies pain Pain Descriptors / Indicators: Sore Pain Intervention(s): Monitored during session    Home Living                          Prior Function            PT  Goals (current goals can now be found in the care plan section) Acute Rehab PT Goals Patient Stated Goal: to get stronger PT Goal Formulation: With patient Time For Goal Achievement: 05/10/21 Potential to Achieve Goals: Good Additional Goals Additional Goal #1: Pt will be able to perform bed mobility/transfers with mod I and safe technique Progress towards PT goals: Progressing toward goals    Frequency    Min 2X/week      PT Plan Current plan remains appropriate    Co-evaluation              AM-PAC PT "6 Clicks" Mobility   Outcome Measure  Help needed turning  from your back to your side while in a flat bed without using bedrails?: A Little Help needed moving from lying on your back to sitting on the side of a flat bed without using bedrails?: A Little Help needed moving to and from a bed to a chair (including a wheelchair)?: A Little Help needed standing up from a chair using your arms (e.g., wheelchair or bedside chair)?: A Little Help needed to walk in hospital room?: A Little Help needed climbing 3-5 steps with a railing? : A Little 6 Click Score: 18    End of Session Equipment Utilized During Treatment: Gait belt Activity Tolerance: Patient limited by fatigue Patient left: in bed;with call bell/phone within reach;with bed alarm set Nurse Communication: Mobility status PT Visit Diagnosis: Muscle weakness (generalized) (M62.81);Difficulty in walking, not elsewhere classified (R26.2)     Time: 4825-0037 PT Time Calculation (min) (ACUTE ONLY): 23 min  Charges:  $Gait Training: 23-37 mins                     Nnenna Meador, PT, GCS 04/28/21,3:17 PM

## 2021-04-28 NOTE — Progress Notes (Signed)
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 S/p sigmoid colon resection w/ colostomy, small bowel resection w/ anastomosis, excision of mesenteric mass & draining of multiple abscess. Mesenteric mass sent to pathology & pending. Intra-abd abscess cx growing e. coli. Continue on IV zosyn. NPO, except ice chips and continue on TPN. A little gas today but no stool output yet. General surg and recs apprec   Leukocytosis: secondary to above vs reactive. Continue on IV abxs   Normocytic anemia: likely secondary to recent chemo & lymphoma. H&H are labile. Will transfuse if Hb < 7.0   Thrombocytosis: labile. Etiology unclear     Hyponatremia: resolved    Hypokalemia: WNL    Hx of diffuse large B-cell lymphoma: last chemo 2 weeks ago   Generalized weakness: PT/OT recs HH. Encourage ambulating and sitting in chair daily     DVT prophylaxis: lovenox  Code Status: full  Family Communication:  Disposition Plan: likely home w/ home health   Level of care: Telemetry Medical  Status is: Inpatient  Remains inpatient appropriate because: severity of illness as stated above    Consultants:  General surg Onco   Procedures:   Antimicrobials: zosyn   Subjective: Pt c/o malaise   Objective: Vitals:   04/27/21 2050 04/27/21 2231 04/28/21 0310 04/28/21 0752  BP: 137/72 139/63 (!) 142/63 (!) 139/56  Pulse: (!) 122 (!) 115 (!) 125 (!) 117  Resp:   16 16  Temp: 98.8 F (37.1 C) 98.7 F (37.1 C) 98.7 F (37.1 C) 98.4 F (36.9 C)  TempSrc: Oral Oral Oral   SpO2: 100% 97% 95% 95%  Weight:      Height:        Intake/Output  Summary (Last 24 hours) at 04/28/2021 0801 Last data filed at 04/28/2021 7673 Gross per 24 hour  Intake 370.87 ml  Output 1700 ml  Net -1329.13 ml   Filed Weights   04/24/21 0500 04/25/21 0550 04/26/21 0500  Weight: 42.6 kg 47.2 kg 49.3 kg    Examination:  General exam: Appears comfortable  Respiratory system: clear breath sounds b/l. No wheezes or rales Cardiovascular system: S1 & S2+. No clicks or rubs  Gastrointestinal system: Abd is soft, NT, ND & hypoactive bowel sounds  Central nervous system: alert and oriented. Moves all extremities  Psychiatry: judgement and insight appear normal. Flat mood and affect    Data Reviewed: I have personally reviewed following labs and imaging studies  CBC: Recent Labs  Lab 04/24/21 0412 04/25/21 0440 04/26/21 0610 04/27/21 0428 04/28/21 0434  WBC 9.2 12.3* 12.9* 15.5* 12.8*  NEUTROABS 6.4 9.3* 9.6* 12.5* 10.8*  HGB 10.7* 10.2* 9.1* 9.0* 9.4*  HCT 32.8* 31.8* 28.5* 28.6* 30.2*  MCV 87.5 89.3 91.9 90.5 92.1  PLT 577* 556* 494* 451* 419*   Basic Metabolic Panel: Recent Labs  Lab 04/22/21 0629 04/23/21 0500 04/24/21 0412 04/25/21 0440  NA 132* 130* 135 138  K 3.9 3.5 3.7 4.0  CL 99 97* 102 108  CO2 26 25 27 26   GLUCOSE 128* 141* 122* 165*  BUN 16 17 19  25*  CREATININE 0.42* 0.46 0.48 0.57  CALCIUM  8.7* 8.6* 8.8* 8.5*  MG 2.0  --  2.1 2.0  PHOS 3.5  --   --  3.0   GFR: Estimated Creatinine Clearance: 48 mL/min (by C-G formula based on SCr of 0.57 mg/dL). Liver Function Tests: Recent Labs  Lab 04/22/21 0629 04/25/21 0440  AST 21 17  ALT 12 13  ALKPHOS 67 63  BILITOT 0.4 0.3  PROT 5.5* 4.9*  ALBUMIN 2.8* 2.3*   No results for input(s): LIPASE, AMYLASE in the last 168 hours. No results for input(s): AMMONIA in the last 168 hours. Coagulation Profile: No results for input(s): INR, PROTIME in the last 168 hours. Cardiac Enzymes: No results for input(s): CKTOTAL, CKMB, CKMBINDEX, TROPONINI in the last 168  hours. BNP (last 3 results) No results for input(s): PROBNP in the last 8760 hours. HbA1C: No results for input(s): HGBA1C in the last 72 hours. CBG: Recent Labs  Lab 04/27/21 0610 04/27/21 1208 04/27/21 1745 04/28/21 0005 04/28/21 0536  GLUCAP 162* 130* 142* 146* 159*   Lipid Profile: No results for input(s): CHOL, HDL, LDLCALC, TRIG, CHOLHDL, LDLDIRECT in the last 72 hours.  Thyroid Function Tests: No results for input(s): TSH, T4TOTAL, FREET4, T3FREE, THYROIDAB in the last 72 hours. Anemia Panel: No results for input(s): VITAMINB12, FOLATE, FERRITIN, TIBC, IRON, RETICCTPCT in the last 72 hours. Sepsis Labs: No results for input(s): PROCALCITON, LATICACIDVEN in the last 168 hours.  Recent Results (from the past 240 hour(s))  Aerobic/Anaerobic Culture w Gram Stain (surgical/deep wound)     Status: None (Preliminary result)   Collection Time: 04/24/21  2:26 PM   Specimen: PATH Other; Tissue  Result Value Ref Range Status   Specimen Description   Final    ABSCESS Performed at [facility], [address] [place], [place] [address]    Special Requests   Final    PELVIC ABSCESS Performed at [facility], [address] [place], [place] [address]    Gram Stain   Final    FEW WBC PRESENT, PREDOMINANTLY MONONUCLEAR FEW GRAM POSITIVE RODS RARE GRAM NEGATIVE RODS    Culture   Final    RARE ESCHERICHIA COLI RARE KLEBSIELLA PNEUMONIAE SUSCEPTIBILITIES TO FOLLOW RARE BACTEROIDES FRAGILIS BETA LACTAMASE POSITIVE Performed at [facility], [place] [address], [place], [place] [address]    Report Status PENDING  Incomplete   Organism ID, Bacteria ESCHERICHIA COLI  Final      Susceptibility   Escherichia coli - MIC*    AMPICILLIN 8 SENSITIVE Sensitive     CEFAZOLIN <=4 SENSITIVE Sensitive     CEFEPIME <=0.12 SENSITIVE Sensitive     CEFTAZIDIME <=1 SENSITIVE Sensitive     CEFTRIAXONE <=0.25 SENSITIVE Sensitive     CIPROFLOXACIN <=0.25 SENSITIVE  Sensitive     GENTAMICIN <=1 SENSITIVE Sensitive     IMIPENEM <=0.25 SENSITIVE Sensitive     TRIMETH/SULFA <=20 SENSITIVE Sensitive     AMPICILLIN/SULBACTAM 4 SENSITIVE Sensitive     PIP/TAZO <=4 SENSITIVE Sensitive     * RARE ESCHERICHIA COLI         Radiology Studies: No results found.      Scheduled Meds:  benzocaine   Mouth/Throat BID   chlorhexidine  15 mL Mouth Rinse BID   Chlorhexidine Gluconate Cloth  6 each Topical Daily   enoxaparin (LOVENOX) injection  40 mg Subcutaneous Q24H   insulin aspart  0-9 Units Subcutaneous Q6H   lidocaine-prilocaine   Topical Once   mouth rinse  15 mL Mouth Rinse q12n4p  pantoprazole (PROTONIX) IV  40 mg Intravenous QHS   Continuous Infusions:  sodium chloride Stopped (04/26/21 2323)   acyclovir 265 mg (04/27/21 2123)   piperacillin-tazobactam (ZOSYN)  IV 3.375 g (04/28/21 0636)   TPN ADULT (ION) 75 mL/hr at 04/27/21 1814     LOS: 13 days    Time spent: 15 mins     Wyvonnia Dusky, MD [facility] Pager [phone]  If 7PM-7AM, please contact night-coverage 04/28/2021, 8:01 AM

## 2021-04-28 NOTE — Progress Notes (Signed)
PHARMACY - TOTAL PARENTERAL NUTRITION CONSULT NOTE   Indication: Prolonged ileus  Patient Measurements: Height: 5\' 2"  (157.5 cm) Weight: 49.3 kg (108 lb 11 oz) IBW/kg (Calculated) : 50.1 TPN AdjBW (KG): 51.7 Body mass index is 19.88 kg/m.  Assessment: 75 y.o. 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  Glucose / Insulin: BG/24h 131-166  Electrolytes: WNL Renal: SCr<1, stable Hepatic: LFTs wnl, TG wnl  Intake / Output; MIVF: no MIVF GI Imaging: 11/17 CT Abd: Sigmoid diverticulitis GI Surgeries / Procedures: no recent surgical interventions  Central access: 11/23/20 TPN start date: 04/19/21  Nutritional Goals: Goal TPN rate is 75 mL/hr (provides 93 g of protein and 1788 kcals per day)  RD Assessment: Estimated Needs Total Energy Estimated Needs: 1700-1900kcal/day Total Protein Estimated Needs: 85-95g/day Total Fluid Estimated Needs: 1.3-1.5L/day  Current Nutrition: NPO  Plan:  Continue TPN at 75 mL/hr at 1800  Total volume including overfill 1800 mL Nutritional Components: Amino acids (using 10% Travasol): 93.2 grams  Dextrose: 270 grams Lipids (using 20% SMOFlipid): 49.6 grams Total kCal: 1651 / 24h Electrolytes in TPN: Na 120 mEq/L, K 50 mEq/L, Ca 5 mEq/L, Mg 5 mEq/L, and Phos 5 > 10 mmol/L. Cl:Ac 1:1 Add standard MVI and trace elements to TPN Continue Sensitive q6h SSI and adjust as needed  Monitor TPN labs on Mon/Thurs, daily until stable  Oswald Hillock, PharmD Clinical Pharmacist 04/28/2021 9:45 AM

## 2021-04-28 NOTE — Progress Notes (Signed)
Patient ID: Mary Washington, female   DOB: 03/01/1946, 75 y.o.   MRN: [id number]     [facility] Day(s): 13.   Interval History: Patient seen and examined, no acute events or new complaints overnight. Patient reports feels that gas is passing. Denies passing stool. Denies nausea. Denies fever  Vital signs in last 24 hours: [min-max] current  Temp:  [98.1 F (36.7 C)-98.8 F (37.1 C)] 98.4 F (36.9 C) (11/27 0752) Pulse Rate:  [110-125] 117 (11/27 0752) Resp:  [16] 16 (11/27 0752) BP: (137-151)/(56-72) 139/56 (11/27 0752) SpO2:  [95 %-100 %] 95 % (11/27 0752)     Height: 5\' 2"  (157.5 cm) Weight: 49.3 kg BMI (Calculated): 19.87   Physical Exam:  Constitutional: alert, cooperative and no distress  Respiratory: breathing non-labored at rest  Cardiovascular: regular rate and sinus rhythm  Gastrointestinal: soft, non-tender, and non-distended. Colostomy pink and patent.   Labs:  CBC Latest Ref Rng & Units 04/28/2021 04/27/2021 04/26/2021  WBC 4.0 - 10.5 K/uL 12.8(H) 15.5(H) 12.9(H)  Hemoglobin 12.0 - 15.0 g/dL 9.4(L) 9.0(L) 9.1(L)  Hematocrit 36.0 - 46.0 % 30.2(L) 28.6(L) 28.5(L)  Platelets 150 - 400 K/uL 469(H) 451(H) 494(H)   CMP Latest Ref Rng & Units 04/25/2021 04/24/2021 04/23/2021  Glucose 70 - 99 mg/dL 165(H) 122(H) 141(H)  BUN 8 - 23 mg/dL 25(H) 19 17  Creatinine 0.44 - 1.00 mg/dL 0.57 0.48 0.46  Sodium 135 - 145 mmol/L 138 135 130(L)  Potassium 3.5 - 5.1 mmol/L 4.0 3.7 3.5  Chloride 98 - 111 mmol/L 108 102 97(L)  CO2 22 - 32 mmol/L 26 27 25   Calcium 8.9 - 10.3 mg/dL 8.5(L) 8.8(L) 8.6(L)  Total Protein 6.5 - 8.1 g/dL 4.9(L) - -  Total Bilirubin 0.3 - 1.2 mg/dL 0.3 - -  Alkaline Phos 38 - 126 U/L 63 - -  AST 15 - 41 U/L 17 - -  ALT 0 - 44 U/L 13 - -    Imaging studies: No new pertinent imaging studies   Assessment/Plan:  75 y.o. female with perforated diverticulitis with pelvic abscess and small bowel obstruction 4 Day Post-Op s/p partial colectomy  with end colostomy creation and small bowel resection with anastomosis, complicated by pertinent comorbidities including diet and B-cell lymphoma on chemotherapy.  Recovering slowly. Possible gas in the bag. No fever and WBC decreasing today. I will clamp the NGT and let patient take sips of water and ice chips. Encourage to ambulate.   [name], MD

## 2021-04-29 ENCOUNTER — Inpatient Hospital Stay: Payer: Medicare Other

## 2021-04-29 ENCOUNTER — Inpatient Hospital Stay: Payer: Medicare Other | Admitting: Internal Medicine

## 2021-04-29 DIAGNOSIS — D72828 Other elevated white blood cell count: Secondary | ICD-10-CM | POA: Diagnosis not present

## 2021-04-29 DIAGNOSIS — K5792 Diverticulitis of intestine, part unspecified, without perforation or abscess without bleeding: Secondary | ICD-10-CM | POA: Diagnosis not present

## 2021-04-29 DIAGNOSIS — K56609 Unspecified intestinal obstruction, unspecified as to partial versus complete obstruction: Secondary | ICD-10-CM | POA: Diagnosis not present

## 2021-04-29 DIAGNOSIS — K5732 Diverticulitis of large intestine without perforation or abscess without bleeding: Secondary | ICD-10-CM

## 2021-04-29 LAB — CBC WITH DIFFERENTIAL/PLATELET
Abs Immature Granulocytes: 0.2 10*3/uL — ABNORMAL HIGH (ref 0.00–0.07)
Basophils Absolute: 0.1 10*3/uL (ref 0.0–0.1)
Basophils Relative: 1 %
Eosinophils Absolute: 0.1 10*3/uL (ref 0.0–0.5)
Eosinophils Relative: 1 %
HCT: 28 % — ABNORMAL LOW (ref 36.0–46.0)
Hemoglobin: 8.9 g/dL — ABNORMAL LOW (ref 12.0–15.0)
Immature Granulocytes: 2 %
Lymphocytes Relative: 4 %
Lymphs Abs: 0.4 10*3/uL — ABNORMAL LOW (ref 0.7–4.0)
MCH: 28.7 pg (ref 26.0–34.0)
MCHC: 31.8 g/dL (ref 30.0–36.0)
MCV: 90.3 fL (ref 80.0–100.0)
Monocytes Absolute: 0.8 10*3/uL (ref 0.1–1.0)
Monocytes Relative: 7 %
Neutro Abs: 9.8 10*3/uL — ABNORMAL HIGH (ref 1.7–7.7)
Neutrophils Relative %: 85 %
Platelets: 395 10*3/uL (ref 150–400)
RBC: 3.1 MIL/uL — ABNORMAL LOW (ref 3.87–5.11)
RDW: 17.1 % — ABNORMAL HIGH (ref 11.5–15.5)
WBC: 11.4 10*3/uL — ABNORMAL HIGH (ref 4.0–10.5)
nRBC: 0 % (ref 0.0–0.2)

## 2021-04-29 LAB — COMPREHENSIVE METABOLIC PANEL
ALT: 22 U/L (ref 0–44)
AST: 27 U/L (ref 15–41)
Albumin: 2.1 g/dL — ABNORMAL LOW (ref 3.5–5.0)
Alkaline Phosphatase: 85 U/L (ref 38–126)
Anion gap: 7 (ref 5–15)
BUN: 23 mg/dL (ref 8–23)
CO2: 29 mmol/L (ref 22–32)
Calcium: 8.5 mg/dL — ABNORMAL LOW (ref 8.9–10.3)
Chloride: 111 mmol/L (ref 98–111)
Creatinine, Ser: 0.64 mg/dL (ref 0.44–1.00)
GFR, Estimated: >60 mL/min (ref >60)
Glucose, Bld: 156 mg/dL — ABNORMAL HIGH (ref 70–99)
Potassium: 2.6 mmol/L — CL (ref 3.5–5.1)
Sodium: 147 mmol/L — ABNORMAL HIGH (ref 135–145)
Total Bilirubin: 0.2 mg/dL — ABNORMAL LOW (ref 0.3–1.2)
Total Protein: 5.4 g/dL — ABNORMAL LOW (ref 6.5–8.1)

## 2021-04-29 LAB — GLUCOSE, CAPILLARY
Glucose-Capillary: 137 mg/dL — ABNORMAL HIGH (ref 70–99)
Glucose-Capillary: 133 mg/dL — ABNORMAL HIGH (ref 70–99)
Glucose-Capillary: 113 mg/dL — ABNORMAL HIGH (ref 70–99)

## 2021-04-29 LAB — MAGNESIUM: Magnesium: 2 mg/dL (ref 1.7–2.4)

## 2021-04-29 LAB — PHOSPHORUS: Phosphorus: 3.2 mg/dL (ref 2.5–4.6)

## 2021-04-29 LAB — TRIGLYCERIDES: Triglycerides: 79 mg/dL (ref <150)

## 2021-04-29 LAB — POTASSIUM: Potassium: 3 mmol/L — ABNORMAL LOW (ref 3.5–5.1)

## 2021-04-29 MED ORDER — POTASSIUM CHLORIDE 10 MEQ/100ML IV SOLN
10.0000 meq | INTRAVENOUS | Status: AC
  Administered 2021-04-29 (×3): 10 meq via INTRAVENOUS
  Filled 2021-04-29 (×4): qty 100

## 2021-04-29 MED ORDER — POTASSIUM CHLORIDE 10 MEQ/100ML IV SOLN
10.0000 meq | INTRAVENOUS | Status: AC
  Administered 2021-04-29 – 2021-04-30 (×4): 10 meq via INTRAVENOUS
  Filled 2021-04-29 (×3): qty 100

## 2021-04-29 MED ORDER — SODIUM CHLORIDE 0.9 % IV SOLN
3.0000 g | Freq: Four times a day (QID) | INTRAVENOUS | Status: DC
  Administered 2021-04-30 – 2021-05-01 (×7): 3 g via INTRAVENOUS
  Filled 2021-04-29: qty 3
  Filled 2021-04-29: qty 8
  Filled 2021-04-29: qty 3
  Filled 2021-04-29 (×4): qty 8
  Filled 2021-04-29 (×2): qty 3

## 2021-04-29 MED ORDER — TRAVASOL 10 % IV SOLN
INTRAVENOUS | Status: DC
  Filled 2021-04-29: qty 936

## 2021-04-29 MED ORDER — TRAVASOL 10 % IV SOLN
INTRAVENOUS | Status: AC
  Filled 2021-04-29: qty 936

## 2021-04-29 NOTE — Progress Notes (Signed)
Occupational Therapy Treatment Patient Details Name: Mary Washington MRN: [id number] DOB: 1945-10-24 Today's Date: 04/29/2021   History of present illness Pt admitted for acute diverticulitis with pelvic abscess and SBO. Pt is now s/p partial colectomy. 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   OT comments  Pt seen for OT tx. Clearance by MD/RN for participation despite low K+ (2.6 but addressing). Pt denies pain, agreeable to participate. Pt requires SBA for bed mobility and step pivot transfer to [facility] for toileting. Set up for hygiene using lateral lean. HR in 120's at rest, up to mid 130's with toileting. Returned to bed after. Pt progressing towards goals, continues to benefit from skilled OT services. Continue to recommend [place].    Recommendations for follow up therapy are one component of a multi-disciplinary discharge planning process, led by the attending physician.  Recommendations may be updated based on patient status, additional functional criteria and insurance authorization.    Follow Up Recommendations  Home health OT    Assistance Recommended at Discharge PRN  Equipment Recommendations  BSC/3in1    Recommendations for Other Services      Precautions / Restrictions Precautions Precautions: Fall Precaution Comments: mod fall Restrictions Weight Bearing Restrictions: No Other Position/Activity Restrictions: abdominal incision, colostomy, NGtube       Mobility Bed Mobility Overal bed mobility: Modified Independent Bed Mobility: Supine to Sit;Sit to Supine     Supine to sit: Modified independent (Device/Increase time) Sit to supine: Modified independent (Device/Increase time)   General bed mobility comments: increased time/effort    Transfers Overall transfer level: Modified independent Equipment used: None Transfers: Sit to/from Stand;Bed to chair/wheelchair/BSC Sit to Stand: Modified independent (Device/Increase time);Supervision   Step pivot  transfers: Supervision       General transfer comment: able to push from seated surface. Use of RW     Balance Overall balance assessment: Needs assistance Sitting-balance support: Feet supported;No upper extremity supported Sitting balance-Leahy Scale: Good     Standing balance support: Single extremity supported;During functional activity Standing balance-Leahy Scale: Fair                             ADL either performed or assessed with clinical judgement   ADL Overall ADL's : Needs assistance/impaired     Grooming: Sitting;Set up;Wash/dry face;Wash/dry Teacher, music: Supervision/safety;Min Dispensing optician Details (indicate cue type and reason): step pivot with UE support on the arm rests of the [facility] Toileting- Clothing Manipulation and Hygiene: Sitting/lateral lean;Set up              Extremity/Trunk Assessment              Vision       Perception     Praxis      Cognition Arousal/Alertness: Awake/alert Behavior During Therapy: WFL for tasks assessed/performed Overall Cognitive Status: Within Functional Limits for tasks assessed                                 General Comments: quiet          Exercises Other Exercises Other Exercises: supine ther-ex performed on B LE AP, SLRs, and quad sets. 10 reps with safe technique   Shoulder Instructions       General Comments  Pertinent Vitals/ Pain       Pain Assessment: No/denies pain  Home Living                                          Prior Functioning/Environment              Frequency  Min 2X/week        Progress Toward Goals  OT Goals(current goals can now be found in the care plan section)  Progress towards OT goals: Progressing toward goals  Acute Rehab OT Goals Patient Stated Goal: get back to eating real food then go home OT Goal Formulation: With patient Time For Goal  Achievement: 05/10/21 Potential to Achieve Goals: Good  Plan Discharge plan remains appropriate;Frequency remains appropriate    Co-evaluation                 AM-PAC OT "6 Clicks" Daily Activity     Outcome Measure   Help from another person eating meals?: None Help from another person taking care of personal grooming?: None Help from another person toileting, which includes using toliet, bedpan, or urinal?: A Little Help from another person bathing (including washing, rinsing, drying)?: A Little Help from another person to put on and taking off regular upper body clothing?: None Help from another person to put on and taking off regular lower body clothing?: A Little 6 Click Score: 21    End of Session    OT Visit Diagnosis: Other abnormalities of gait and mobility (R26.89);Muscle weakness (generalized) (M62.81)   Activity Tolerance Patient tolerated treatment well   Patient Left in bed;with call bell/phone within reach;with bed alarm set   Nurse Communication          Time: 1638-4536 OT Time Calculation (min): 12 min  Charges: OT General Charges $OT Visit: 1 Visit OT Treatments $Self Care/Home Management : 8-22 mins  Ardeth Perfect., MPH, MS, OTR/L ascom [phone] 04/29/21, 4:44 PM

## 2021-04-29 NOTE — Consult Note (Addendum)
[place] Nurse ostomy consult note Stoma type/location: LLQ end colostomy  Stomal assessment/size: 1 3/4" round, budded, above skin level, Os at center Peristomal assessment: intact  Output ; small amt yellow liquid; no stool or flatus  Ostomy pouching: 2pc. 2 3/4"  Education provided : Pt states her husband is sick and unable to visit.  Will plan to perform another teaching session on WED and hopefully he will be able to attend. He was present for a previous teaching session on 11/25.  Explained role of ostomy nurse and creation of stoma  Explained stoma characteristics (budded, flush, color, texture, care) Demonstrated pouch change (cutting new skin barrier, measuring stoma, cleaning peristomal skin and stoma, use of barrier ring) Education on emptying when 1/3 to 1/2 full and how to empty Demonstrated "burping" flatus from pouch.  Demonstrated pouch change using 2 piece pouching system; wafer Kellie Simmering # 2, pouch Lawson # [id number].  Pt watched the procedure and asked appropriate questions.  She was able to open and close velcro to empty.  Reviewed pouching routines and ordering supplies. Educational materials and 3 sets of wafers/pouches left at the bedside.  Enrolled patient in [place] program: Yes [place] Nurse will follow along with you for continued support with ostomy teaching and care Thank-you,  Julien Girt MSN, [place], [place], [place], [place]

## 2021-04-29 NOTE — Progress Notes (Signed)
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 S/p sigmoid colon resection w/ colostomy, small bowel resection w/ anastomosis, excision of mesenteric mass & draining of multiple abscess. Mesenteric mass sent to pathology & pending. Intra-abd abscess cx growing e. coli. Continue on IV zosyn. NG tube is clamped and will be removed as per general surg. Continue on TPN & advanced to clear liquid diet as per general surg. Gas in ostomy today but still no stool. General surg and recs apprec   Leukocytosis: secondary to above vs reactive. Continue on IV abxs  Normocytic anemia: likely secondary to recent chemo & lymphoma. H&H are trending down but no need for a transfusion currently   Thrombocytosis: resolved   Hyponatremia: resolved   Hypernatremia: free water deficit 1.0L. Will be managed w/ TPN    Hypokalemia: potassium given   Hx of diffuse large B-cell lymphoma: last chemo 2 weeks ago   Generalized weakness: PT/OT recs HH. Encourage ambulating and sitting in chair daily     DVT prophylaxis: lovenox  Code Status: full  Family Communication:  Disposition Plan: likely home w/ home health   Level of care: Telemetry Medical  Status is: Inpatient  Remains inpatient appropriate because: severity of illness as stated above    Consultants:  General surg Onco   Procedures:   Antimicrobials: zosyn   Subjective: Pt c/o fatigue   Objective: Vitals:   04/28/21 1934 04/28/21 2314 04/29/21 0426 04/29/21 0542  BP: 138/61 132/65 (!) 143/64   Pulse: (!) 128 (!) 134 (!) 117   Resp: 18 20 18    Temp: 98.1 F (36.7 C) 99.7 F  (37.6 C) 98.3 F (36.8 C)   TempSrc: Oral Oral Oral   SpO2: 98% 97% 96%   Weight:    46.6 kg  Height:        Intake/Output Summary (Last 24 hours) at 04/29/2021 0743 Last data filed at 04/29/2021 0211 Gross per 24 hour  Intake 2422.99 ml  Output 500 ml  Net 1922.99 ml   Filed Weights   04/25/21 0550 04/26/21 0500 04/29/21 0542  Weight: 47.2 kg 49.3 kg 46.6 kg    Examination:  General exam: Appears calm & comfortable  Respiratory system: clear breath sounds b/l  Cardiovascular system: E7/N1+ No rubs or clicks  Gastrointestinal system: Abd is soft, NT, ND & hypoactive bowel sounds  Central nervous system: alert and oriented. Moves all extremities  Psychiatry: judgement and insight appears normal. Flat mood and affect     Data Reviewed: I have personally reviewed following labs and imaging studies  CBC: Recent Labs  Lab 04/25/21 0440 04/26/21 0610 04/27/21 0428 04/28/21 0434 04/29/21 0608  WBC 12.3* 12.9* 15.5* 12.8* 11.4*  NEUTROABS 9.3* 9.6* 12.5* 10.8* 9.8*  HGB 10.2* 9.1* 9.0* 9.4* 8.9*  HCT 31.8* 28.5* 28.6* 30.2* 28.0*  MCV 89.3 91.9 90.5 92.1 90.3  PLT 556* 494* 451* 469* 700   Basic Metabolic Panel: Recent Labs  Lab 04/23/21 0500 04/24/21 0412 04/25/21 0440  NA 130* 135 138  K 3.5 3.7 4.0  CL 97* 102 108  CO2 25 27 26   GLUCOSE 141* 122* 165*  BUN  17 19 25*  CREATININE 0.46 0.48 0.57  CALCIUM 8.6* 8.8* 8.5*  MG  --  2.1 2.0  PHOS  --   --  3.0   GFR: Estimated Creatinine Clearance: 45.4 mL/min (by C-G formula based on SCr of 0.57 mg/dL). Liver Function Tests: Recent Labs  Lab 04/25/21 0440  AST 17  ALT 13  ALKPHOS 63  BILITOT 0.3  PROT 4.9*  ALBUMIN 2.3*   No results for input(s): LIPASE, AMYLASE in the last 168 hours. No results for input(s): AMMONIA in the last 168 hours. Coagulation Profile: No results for input(s): INR, PROTIME in the last 168 hours. Cardiac Enzymes: No results for input(s): CKTOTAL, CKMB, CKMBINDEX,  TROPONINI in the last 168 hours. BNP (last 3 results) No results for input(s): PROBNP in the last 8760 hours. HbA1C: No results for input(s): HGBA1C in the last 72 hours. CBG: Recent Labs  Lab 04/28/21 0536 04/28/21 1147 04/28/21 1727 04/28/21 2306 04/29/21 0425  GLUCAP 159* 159* 117* 147* 137*   Lipid Profile: No results for input(s): CHOL, HDL, LDLCALC, TRIG, CHOLHDL, LDLDIRECT in the last 72 hours.  Thyroid Function Tests: No results for input(s): TSH, T4TOTAL, FREET4, T3FREE, THYROIDAB in the last 72 hours. Anemia Panel: No results for input(s): VITAMINB12, FOLATE, FERRITIN, TIBC, IRON, RETICCTPCT in the last 72 hours. Sepsis Labs: No results for input(s): PROCALCITON, LATICACIDVEN in the last 168 hours.  Recent Results (from the past 240 hour(s))  Aerobic/Anaerobic Culture w Gram Stain (surgical/deep wound)     Status: None   Collection Time: 04/24/21  2:26 PM   Specimen: PATH Other; Tissue  Result Value Ref Range Status   Specimen Description   Final    ABSCESS Performed at [facility], [address], [place], [place] [address]    Special Requests   Final    PELVIC ABSCESS Performed at [facility], [address] [place], [place] [address]    Gram Stain   Final    FEW WBC PRESENT, PREDOMINANTLY MONONUCLEAR FEW GRAM POSITIVE RODS RARE GRAM NEGATIVE RODS    Culture   Final    RARE ESCHERICHIA COLI RARE KLEBSIELLA PNEUMONIAE RARE BACTEROIDES FRAGILIS BETA LACTAMASE POSITIVE Performed at [facility], [place] [address], [place], [place] [address]    Report Status 04/28/2021 FINAL  Final   Organism ID, Bacteria ESCHERICHIA COLI  Final   Organism ID, Bacteria KLEBSIELLA PNEUMONIAE  Final      Susceptibility   Escherichia coli - MIC*    AMPICILLIN 8 SENSITIVE Sensitive     CEFAZOLIN <=4 SENSITIVE Sensitive     CEFEPIME <=0.12 SENSITIVE Sensitive     CEFTAZIDIME <=1 SENSITIVE Sensitive     CEFTRIAXONE <=0.25 SENSITIVE Sensitive      CIPROFLOXACIN <=0.25 SENSITIVE Sensitive     GENTAMICIN <=1 SENSITIVE Sensitive     IMIPENEM <=0.25 SENSITIVE Sensitive     TRIMETH/SULFA <=20 SENSITIVE Sensitive     AMPICILLIN/SULBACTAM 4 SENSITIVE Sensitive     PIP/TAZO <=4 SENSITIVE Sensitive     * RARE ESCHERICHIA COLI   Klebsiella pneumoniae - MIC*    AMPICILLIN RESISTANT Resistant     CEFAZOLIN <=4 SENSITIVE Sensitive     CEFEPIME <=0.12 SENSITIVE Sensitive     CEFTAZIDIME <=1 SENSITIVE Sensitive     CEFTRIAXONE <=0.25 SENSITIVE Sensitive     CIPROFLOXACIN <=0.25 SENSITIVE Sensitive     GENTAMICIN <=1 SENSITIVE Sensitive     IMIPENEM <=0.25 SENSITIVE Sensitive     TRIMETH/SULFA <=20 SENSITIVE Sensitive  AMPICILLIN/SULBACTAM 4 SENSITIVE Sensitive     PIP/TAZO <=4 SENSITIVE Sensitive     * RARE KLEBSIELLA PNEUMONIAE         Radiology Studies: No results found.      Scheduled Meds:  benzocaine   Mouth/Throat BID   chlorhexidine  15 mL Mouth Rinse BID   Chlorhexidine Gluconate Cloth  6 each Topical Daily   enoxaparin (LOVENOX) injection  40 mg Subcutaneous Q24H   insulin aspart  0-9 Units Subcutaneous Q6H   lidocaine-prilocaine   Topical Once   mouth rinse  15 mL Mouth Rinse q12n4p   pantoprazole (PROTONIX) IV  40 mg Intravenous QHS   Continuous Infusions:  sodium chloride Stopped (04/26/21 2323)   acyclovir 265 mg (04/28/21 2136)   piperacillin-tazobactam (ZOSYN)  IV 3.375 g (04/29/21 0537)   TPN ADULT (ION) 75 mL/hr at 04/29/21 0211     LOS: 14 days    Time spent: 15 mins     Wyvonnia Dusky, MD [facility] Pager [phone]  If 7PM-7AM, please contact night-coverage 04/29/2021, 7:43 AM

## 2021-04-29 NOTE — Progress Notes (Signed)
Date of Admission:  04/15/2021    ID: Mary Washington is a 75 y.o. 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 Pt was diagnosed with lymphoa in June 2022, her last chemo was on 04/08/21. CT abdomen showed acute sigmoid diverticulitis with bowel perf and early abscess formation- she was started on zosyn- because of neutropenia the surgeons wanted to manage this conservatively until her wbc recovered- on 11/23 she was taken to the OR and underwent sigmoid colon resection with colostomy, Small bowel resection with anastomosis for SBO and excision of mesenteric mass.   Subjective:  Says she is passing flatus Not had a bowel movt Ng tube still present   Medications:   benzocaine   Mouth/Throat BID   chlorhexidine  15 mL Mouth Rinse BID   Chlorhexidine Gluconate Cloth  6 each Topical Daily   enoxaparin (LOVENOX) injection  40 mg Subcutaneous Q24H   insulin aspart  0-9 Units Subcutaneous Q6H   lidocaine-prilocaine   Topical Once   mouth rinse  15 mL Mouth Rinse q12n4p   pantoprazole (PROTONIX) IV  40 mg Intravenous QHS    Objective: Vital signs in last 24 hours: Temp:  [98.2 F (36.8 C)-99.7 F (37.6 C)] 98.9 F (37.2 C) (11/28 2010) Pulse Rate:  [117-134] 130 (11/28 2010) Resp:  [18-20] 18 (11/28 2010) BP: (132-147)/(61-74) 145/74 (11/28 2010) SpO2:  [96 %-98 %] 98 % (11/28 2010) Weight:  [46.6 kg] 46.6 kg (11/28 0542)  PHYSICAL EXAM:  General: Alert, cooperative, frail and pale Head: Normocephalic, without obvious abnormality, atraumatic. Eyes: Conjunctivae clear, anicteric sclerae. Pupils are equal ENT NG tube- bilious fluid Lips, mucosa,  and tongue normal. No Thrush Neck: Supple, symmetrical, no adenopathy, thyroid: non tender no carotid bruit and no JVD. Back: No CVA tenderness. Lungs: b/l air entry - decreased bases Heart: Regular rate and rhythm, no murmur, rub or gallop. Abdomen: Soft, colostomy Bowel sounds present Extremities: atraumatic, no cyanosis. No edema. No clubbing Skin: No rashes or lesions. Or bruising Lymph: Cervical, supraclavicular normal. Neurologic: Grossly non-focal PORT in place  Lab Results Recent Labs    04/28/21 0434 04/29/21 0608 04/29/21 1818  WBC 12.8* 11.4*  --   HGB 9.4* 8.9*  --   HCT 30.2* 28.0*  --   NA  --  147*  --   K  --  2.6* 3.0*  CL  --  111  --   CO2  --  29  --   BUN  --  23  --   CREATININE  --  0.64  --    Liver Panel Recent Labs    04/29/21 0608  PROT 5.4*  ALBUMIN 2.1*  AST 27  ALT 22  ALKPHOS 85  BILITOT 0.2*    Microbiology:  Studies/Results: No results found.   Assessment/Plan:  Perforated sigmoid diverticulitis with pelvic abscesses- s/p lapartomy, resection and colostomy Small bowel obstruction due to adherence of loop to pelvic abscess- s/p small segment of small bowel resected Culture positive for e.coli, kleb and bacteroides- susceptible to unasyn- DC zosyn and change to unasyn May be ble to discontinue antibiotics withon 48 hrs   Mesenteric mass- resected  Pt has not had bowel function established yet- has NG tube   Hypokalemia- being corrected   Neutropenia following chemo- has resolved   Anemia   B cell lymphoma- last chemo on 04/08/21- vincristine, cyclophophamide, doxorubicin, rituximab andprednisone   Discussed the management with patient and pharmacist

## 2021-04-29 NOTE — Progress Notes (Signed)
Patient ID: Mary Washington, female   DOB: 1945-12-30, 75 y.o.   MRN: [id number]     [facility] Day(s): 14.   Interval History: Patient seen and examined, no acute events or new complaints overnight. Patient reports has been passing small amount of gas but no stool.  She has some burping after lunch.  She felt full.  Denies nausea or vomiting.  Vital signs in last 24 hours: [min-max] current  Temp:  [98.1 F (36.7 C)-99.7 F (37.6 C)] 98.2 F (36.8 C) (11/28 0837) Pulse Rate:  [117-134] 118 (11/28 0837) Resp:  [18-20] 20 (11/28 0837) BP: (132-147)/(61-65) 147/61 (11/28 0837) SpO2:  [96 %-98 %] 97 % (11/28 0837) Weight:  [46.6 kg] 46.6 kg (11/28 0542)     Height: 5\' 2"  (157.5 cm) Weight: 46.6 kg BMI (Calculated): 18.79   Physical Exam:  Constitutional: alert, cooperative and no distress  Respiratory: breathing non-labored at rest  Cardiovascular: regular rate and sinus rhythm  Gastrointestinal: soft, non-tender, and non-distended.  Colostomy pink and patent with expected edema.  Labs:  CBC Latest Ref Rng & Units 04/29/2021 04/28/2021 04/27/2021  WBC 4.0 - 10.5 K/uL 11.4(H) 12.8(H) 15.5(H)  Hemoglobin 12.0 - 15.0 g/dL 8.9(L) 9.4(L) 9.0(L)  Hematocrit 36.0 - 46.0 % 28.0(L) 30.2(L) 28.6(L)  Platelets 150 - 400 K/uL 395 469(H) 451(H)   CMP Latest Ref Rng & Units 04/29/2021 04/25/2021 04/24/2021  Glucose 70 - 99 mg/dL 156(H) 165(H) 122(H)  BUN 8 - 23 mg/dL 23 25(H) 19  Creatinine 0.44 - 1.00 mg/dL 0.64 0.57 0.48  Sodium 135 - 145 mmol/L 147(H) 138 135  Potassium 3.5 - 5.1 mmol/L 2.6(LL) 4.0 3.7  Chloride 98 - 111 mmol/L 111 108 102  CO2 22 - 32 mmol/L 29 26 27   Calcium 8.9 - 10.3 mg/dL 8.5(L) 8.5(L) 8.8(L)  Total Protein 6.5 - 8.1 g/dL 5.4(L) 4.9(L) -  Total Bilirubin 0.3 - 1.2 mg/dL 0.2(L) 0.3 -  Alkaline Phos 38 - 126 U/L 85 63 -  AST 15 - 41 U/L 27 17 -  ALT 0 - 44 U/L 22 13 -    Imaging studies: No new pertinent imaging studies   Assessment/Plan:  75  y.o. female with perforated diverticulitis with pelvic abscess and small bowel obstruction 5 Day Post-Op s/p partial colectomy with end colostomy creation and small bowel resection with anastomosis, complicated by pertinent comorbidities including diet and B-cell lymphoma on chemotherapy.  Patient today continues to recover slowly.  She started passing small amount of gas today colostomy bag but no stool yet.  She felt a little bit of burping after lunch, consistent with slow motility.  We will give NGT clamped.  Patient can continue taking sips of clear liquids.  Encourage the patient to ambulate.  White blood cell count continued to decrease.  No fever.  [name], MD

## 2021-04-29 NOTE — Progress Notes (Signed)
PHARMACY - TOTAL PARENTERAL NUTRITION CONSULT NOTE   Indication: Prolonged ileus  Patient Measurements: Height: 5\' 2"  (157.5 cm) Weight: 46.6 kg (102 lb 11.8 oz) IBW/kg (Calculated) : 50.1 TPN AdjBW (KG): 51.7 Body mass index is 18.79 kg/m.  Assessment: 75 y.o. 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  Glucose / Insulin: BG/24h 117 - 159 (5 units SSI required) Electrolytes: hypokalemia Renal: SCr<1, stable Hepatic: LFTs wnl, TG wnl  Intake / Output; MIVF: no MIVF GI Imaging: 11/17 CT Abd: Sigmoid diverticulitis GI Surgeries / Procedures:  11/23: partial colectomy with end colostomy creation and small bowel resection with anastomosis  Central access: 11/23/20 TPN start date: 04/19/21  Nutritional Goals: Goal TPN rate is 75 mL/hr (provides 93 g of protein and 1788 kcals per day)  RD Assessment: Estimated Needs Total Energy Estimated Needs: 1700-1900kcal/day Total Protein Estimated Needs: 85-95g/day Total Fluid Estimated Needs: 1.3-1.5L/day  Current Nutrition: NPO  Plan:  Continue TPN at 75 mL/hr  Total volume including overfill 1900 mL Nutritional Components: Amino acids (using 10% Travasol): 93.6 grams  Dextrose: 270 grams Lipids (using 20% SMOFlipid): 49.6 grams Total kCal: 1651 / 24h Electrolytes in TPN: Na 120 mEq/L, K 50 mEq/L, Ca 5 mEq/L, Mg 5 mEq/L, and Phos 10 mmol/L. Cl:Ac 1:1 Add standard MVI and trace elements to TPN 10 mEq IV KCl x 4 Continue Sensitive q6h SSI and adjust as needed  Monitor TPN labs on Mon/Thurs, daily until stable  Dallie Piles, PharmD Clinical Pharmacist 04/29/2021 6:59 AM

## 2021-04-29 NOTE — Progress Notes (Addendum)
Physical Therapy Treatment Patient Details Name: Mary Washington MRN: [id number] DOB: [birth year] Today's Date: 04/29/2021   History of Present Illness Pt admitted for acute diverticulitis with pelvic abscess and SBO. Pt is now s/p partial colectomy. 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    PT Comments    Cleared to work with patient from MD despite low K+. Continues to need encouragement to ambulate. HR elevated to 135bpm with exertion and O2 sats at 98% on RA post exertion. Able to ambulate slightly increased distance with use of RW. Able to sit in recliner at end of session. WIll continue to progress as able.  Recommendations for follow up therapy are one component of a multi-disciplinary discharge planning process, led by the attending physician.  Recommendations may be updated based on patient status, additional functional criteria and insurance authorization.  Follow Up Recommendations  Home health PT     Assistance Recommended at Discharge Intermittent Supervision/Assistance  Equipment Recommendations  None recommended by PT    Recommendations for Other Services       Precautions / Restrictions Precautions Precautions: Fall Precaution Comments: mod fall Restrictions Weight Bearing Restrictions: No Other Position/Activity Restrictions: abdominal incision, colostomy, NGtube     Mobility  Bed Mobility Overal bed mobility: Modified Independent Bed Mobility: Supine to Sit     Supine to sit: Modified independent (Device/Increase time)     General bed mobility comments: safe technique    Transfers Overall transfer level: Modified independent Equipment used: Rolling walker (2 wheels) Transfers: Sit to/from Stand Sit to Stand: Modified independent (Device/Increase time)           General transfer comment: able to push from seated surface. Use of RW    Ambulation/Gait Ambulation/Gait assistance: Min guard Gait Distance (Feet): 150 Feet Assistive  device: Rolling walker (2 wheels) Gait Pattern/deviations: Step-through pattern;Decreased step length - right;Decreased step length - left       General Gait Details: ambulated slightly further this session, although is self limiting. RW used with reciprocal Radio broadcast assistant    Modified Rankin (Stroke Patients Only)       Balance Overall balance assessment: Needs assistance Sitting-balance support: Feet supported Sitting balance-Leahy Scale: Good     Standing balance support: Bilateral upper extremity supported;During functional activity;Reliant on assistive device for balance Standing balance-Leahy Scale: Fair                              Cognition Arousal/Alertness: Awake/alert Behavior During Therapy: WFL for tasks assessed/performed Overall Cognitive Status: Within Functional Limits for tasks assessed                                 General Comments: quiet        Exercises Other Exercises Other Exercises: supine ther-ex performed on B LE AP, SLRs, and quad sets. 10 reps with safe technique    General Comments        Pertinent Vitals/Pain Pain Assessment: No/denies pain    Home Living                          Prior Function            PT Goals (current goals can now be found in the care plan section) Acute  Rehab PT Goals Patient Stated Goal: to get stronger PT Goal Formulation: With patient Time For Goal Achievement: 05/10/21 Potential to Achieve Goals: Good Progress towards PT goals: Progressing toward goals    Frequency    Min 2X/week      PT Plan Current plan remains appropriate    Co-evaluation              AM-PAC PT "6 Clicks" Mobility   Outcome Measure  Help needed turning from your back to your side while in a flat bed without using bedrails?: None Help needed moving from lying on your back to sitting on the side of a flat bed without using bedrails?:  None Help needed moving to and from a bed to a chair (including a wheelchair)?: A Little Help needed standing up from a chair using your arms (e.g., wheelchair or bedside chair)?: A Little Help needed to walk in hospital room?: A Little Help needed climbing 3-5 steps with a railing? : A Little 6 Click Score: 20    End of Session Equipment Utilized During Treatment: Gait belt Activity Tolerance: Patient limited by fatigue Patient left: in chair Nurse Communication: Mobility status PT Visit Diagnosis: Muscle weakness (generalized) (M62.81);Difficulty in walking, not elsewhere classified (R26.2)     Time: 1040-1103 PT Time Calculation (min) (ACUTE ONLY): 23 min  Charges:  $Gait Training: 8-22 mins $Therapeutic Exercise: 8-22 mins                     Greggory Stallion, PT, DPT [phone]    Ivelise Castillo 04/29/2021, 1:36 PM

## 2021-04-29 NOTE — Plan of Care (Signed)
  Problem: Clinical Measurements: Goal: Will remain free from infection Outcome: Not Progressing   Problem: Nutrition: Goal: Adequate nutrition will be maintained Outcome: Not Progressing   Problem: Elimination: Goal: Will not experience complications related to bowel motility Outcome: Not Progressing   Problem: Elimination: Goal: Will not experience complications related to urinary retention Outcome: Not Progressing   Problem: Pain Managment: Goal: General experience of comfort will improve Outcome: Not Progressing

## 2021-04-30 ENCOUNTER — Inpatient Hospital Stay: Payer: Medicare Other

## 2021-04-30 ENCOUNTER — Encounter: Payer: Self-pay | Admitting: General Surgery

## 2021-04-30 DIAGNOSIS — K5792 Diverticulitis of intestine, part unspecified, without perforation or abscess without bleeding: Secondary | ICD-10-CM | POA: Diagnosis not present

## 2021-04-30 DIAGNOSIS — D72828 Other elevated white blood cell count: Secondary | ICD-10-CM | POA: Diagnosis not present

## 2021-04-30 DIAGNOSIS — K567 Ileus, unspecified: Secondary | ICD-10-CM

## 2021-04-30 DIAGNOSIS — K56609 Unspecified intestinal obstruction, unspecified as to partial versus complete obstruction: Secondary | ICD-10-CM | POA: Diagnosis not present

## 2021-04-30 LAB — BASIC METABOLIC PANEL
Anion gap: 4 — ABNORMAL LOW (ref 5–15)
BUN: 18 mg/dL (ref 8–23)
CO2: 26 mmol/L (ref 22–32)
Calcium: 8.1 mg/dL — ABNORMAL LOW (ref 8.9–10.3)
Chloride: 110 mmol/L (ref 98–111)
Creatinine, Ser: 0.57 mg/dL (ref 0.44–1.00)
GFR, Estimated: >60 mL/min (ref >60)
Glucose, Bld: 142 mg/dL — ABNORMAL HIGH (ref 70–99)
Potassium: 3.2 mmol/L — ABNORMAL LOW (ref 3.5–5.1)
Sodium: 140 mmol/L (ref 135–145)

## 2021-04-30 LAB — GLUCOSE, CAPILLARY
Glucose-Capillary: 119 mg/dL — ABNORMAL HIGH (ref 70–99)
Glucose-Capillary: 122 mg/dL — ABNORMAL HIGH (ref 70–99)
Glucose-Capillary: 138 mg/dL — ABNORMAL HIGH (ref 70–99)
Glucose-Capillary: 139 mg/dL — ABNORMAL HIGH (ref 70–99)
Glucose-Capillary: 154 mg/dL — ABNORMAL HIGH (ref 70–99)

## 2021-04-30 LAB — SURGICAL PATHOLOGY

## 2021-04-30 LAB — CBC WITH DIFFERENTIAL/PLATELET
Abs Immature Granulocytes: 0.17 10*3/uL — ABNORMAL HIGH (ref 0.00–0.07)
Basophils Absolute: 0.1 10*3/uL (ref 0.0–0.1)
Basophils Relative: 1 %
Eosinophils Absolute: 0.1 10*3/uL (ref 0.0–0.5)
Eosinophils Relative: 1 %
HCT: 26.7 % — ABNORMAL LOW (ref 36.0–46.0)
Hemoglobin: 8.6 g/dL — ABNORMAL LOW (ref 12.0–15.0)
Immature Granulocytes: 2 %
Lymphocytes Relative: 5 %
Lymphs Abs: 0.5 10*3/uL — ABNORMAL LOW (ref 0.7–4.0)
MCH: 29.3 pg (ref 26.0–34.0)
MCHC: 32.2 g/dL (ref 30.0–36.0)
MCV: 90.8 fL (ref 80.0–100.0)
Monocytes Absolute: 0.5 10*3/uL (ref 0.1–1.0)
Monocytes Relative: 6 %
Neutro Abs: 7.3 10*3/uL (ref 1.7–7.7)
Neutrophils Relative %: 85 %
Platelets: 316 10*3/uL (ref 150–400)
RBC: 2.94 MIL/uL — ABNORMAL LOW (ref 3.87–5.11)
RDW: 16.9 % — ABNORMAL HIGH (ref 11.5–15.5)
WBC: 8.7 10*3/uL (ref 4.0–10.5)
nRBC: 0 % (ref 0.0–0.2)

## 2021-04-30 MED ORDER — TRAVASOL 10 % IV SOLN
INTRAVENOUS | Status: AC
  Filled 2021-04-30: qty 936

## 2021-04-30 MED ORDER — POTASSIUM CHLORIDE 20 MEQ PO PACK
40.0000 meq | PACK | Freq: Three times a day (TID) | ORAL | Status: AC
  Administered 2021-04-30 (×2): 40 meq via ORAL

## 2021-04-30 MED ORDER — POTASSIUM CHLORIDE 20 MEQ PO PACK
PACK | ORAL | Status: AC
  Filled 2021-04-30: qty 2

## 2021-04-30 MED ORDER — POLYETHYLENE GLYCOL 3350 17 G PO PACK
17.0000 g | PACK | Freq: Every day | ORAL | Status: DC
  Administered 2021-04-30 – 2021-05-01 (×2): 17 g via ORAL
  Filled 2021-04-30: qty 1

## 2021-04-30 MED ORDER — BENZOCAINE 10 % MT GEL
Freq: Two times a day (BID) | OROMUCOSAL | Status: DC | PRN
  Filled 2021-04-30: qty 9

## 2021-04-30 MED ORDER — POTASSIUM CHLORIDE 10 MEQ/100ML IV SOLN
10.0000 meq | Freq: Once | INTRAVENOUS | Status: AC
  Administered 2021-04-30: 10 meq via INTRAVENOUS

## 2021-04-30 MED ORDER — BOOST / RESOURCE BREEZE PO LIQD CUSTOM
1.0000 | Freq: Three times a day (TID) | ORAL | Status: DC
  Administered 2021-04-30 – 2021-05-01 (×3): 1 via ORAL

## 2021-04-30 MED ORDER — ACYCLOVIR 200 MG PO CAPS
400.0000 mg | ORAL_CAPSULE | Freq: Two times a day (BID) | ORAL | Status: DC
  Administered 2021-04-30 – 2021-05-03 (×6): 400 mg via ORAL
  Filled 2021-04-30 (×7): qty 2

## 2021-04-30 NOTE — Progress Notes (Signed)
   Date of Admission:  04/15/2021    ID: Mary Washington is a 75 y.o. 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 Pt was diagnosed with lymphoa in June 2022, her last chemo was on 04/08/21. CT abdomen showed acute sigmoid diverticulitis with bowel perf and early abscess formation- she was started on zosyn- because of neutropenia the surgeons wanted to manage this conservatively until her wbc recovered- on 11/23 she was taken to the OR and underwent sigmoid colon resection with colostomy, Small bowel resection with anastomosis for SBO and excision of mesenteric mass.   Subjective:  Ng tube removed She is taking broth Passing flatus No bowel movt yet Has walked     Medications:   acyclovir  400 mg Oral BID   Chlorhexidine Gluconate Cloth  6 each Topical Daily   enoxaparin (LOVENOX) injection  40 mg Subcutaneous Q24H   feeding supplement  1 Container Oral TID BM   insulin aspart  0-9 Units Subcutaneous Q6H   lidocaine-prilocaine   Topical Once   pantoprazole (PROTONIX) IV  40 mg Intravenous QHS   polyethylene glycol  17 g Oral Daily   potassium chloride  40 mEq Oral TID    Objective: Vital signs in last 24 hours: Temp:  [98.2 F (36.8 C)-99.6 F (37.6 C)] 98.2 F (36.8 C) (11/29 1454) Pulse Rate:  [114-130] 114 (11/29 1454) Resp:  [18-20] 19 (11/29 1454) BP: (138-145)/(57-75) 144/75 (11/29 1454) SpO2:  [97 %-98 %] 98 % (11/29 1454)  PHYSICAL EXAM:  General: Alert, cooperative, frail and pale Lungs: b/l air entry - decreased bases Heart: s1s2 Abdomen: Soft, colostomy Bowel sounds present Extremities: atraumatic, no cyanosis. No edema. No  clubbing Skin: No rashes or lesions. Or bruising Lymph: Cervical, supraclavicular normal. Neurologic: Grossly non-focal PORT in place  Lab Results Recent Labs    04/29/21 0608 04/29/21 1818 04/30/21 0646  WBC 11.4*  --  8.7  HGB 8.9*  --  8.6*  HCT 28.0*  --  26.7*  NA 147*  --  140  K 2.6* 3.0* 3.2*  CL 111  --  110  CO2 29  --  26  BUN 23  --  18  CREATININE 0.64  --  0.57   Liver Panel Recent Labs    04/29/21 0608  PROT 5.4*  ALBUMIN 2.1*  AST 27  ALT 22  ALKPHOS 85  BILITOT 0.2*    Microbiology:  Studies/Results: No results found.   Assessment/Plan:  Perforated sigmoid diverticulitis with pelvic abscesses- s/p lapartomy, resection and colostomy Small bowel obstruction due to adherence of loop to pelvic abscess- s/p small segment of small bowel resected Culture positive for e.coli, kleb and bacteroides- susceptible to unasyn- DC zosyn and changed to unasyn on 11/28 May be ble to discontinue antibiotics within 48 hrs   Mesenteric mass- resected   Pt has not had bowel function established yet- has NG tube   Hypokalemia- being corrected   Neutropenia following chemo- has resolved   Anemia   B cell lymphoma- last chemo on 04/08/21- vincristine, cyclophophamide, doxorubicin, rituximab andprednisone   Discussed the management with patient

## 2021-04-30 NOTE — Progress Notes (Signed)
Physical Therapy Treatment Patient Details Name: Mary Washington MRN: [id number] DOB: [birth year] Today's Date: 04/30/2021   History of Present Illness Pt admitted for acute diverticulitis with pelvic abscess and SBO. Pt is now s/p partial colectomy. 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    PT Comments    Pt is making gradual progress towards goals with ability to ambulate entire RN station this date using RW. Does fatigue with increased distance. Patient happy that NG tube was removed this AM. Able to ambulate to [facility] prior to ambulation. Encouraged to continue to sit in recliner and perform IS. Will continue to progress.  Recommendations for follow up therapy are one component of a multi-disciplinary discharge planning process, led by the attending physician.  Recommendations may be updated based on patient status, additional functional criteria and insurance authorization.  Follow Up Recommendations  Home health PT     Assistance Recommended at Discharge Intermittent Supervision/Assistance  Equipment Recommendations  None recommended by PT    Recommendations for Other Services       Precautions / Restrictions Precautions Precautions: Fall Precaution Comments: mod fall Restrictions Weight Bearing Restrictions: No Other Position/Activity Restrictions: abdominal incision, colostomy     Mobility  Bed Mobility Overal bed mobility: Modified Independent Bed Mobility: Supine to Sit;Sit to Supine     Supine to sit: Modified independent (Device/Increase time) Sit to supine: Modified independent (Device/Increase time)   General bed mobility comments: increased time/effort    Transfers Overall transfer level: Modified independent Equipment used: Rolling walker (2 wheels) Transfers: Sit to/from Stand;Bed to chair/wheelchair/BSC Sit to Stand: Modified independent (Device/Increase time);Supervision     Step pivot transfers: Modified independent (Device/Increase  time)     General transfer comment: improved power this date. Able to stand easily    Ambulation/Gait Ambulation/Gait assistance: Supervision Gait Distance (Feet): 200 Feet Assistive device: Rolling walker (2 wheels) Gait Pattern/deviations: Step-through pattern;Decreased step length - right;Decreased step length - left       General Gait Details: ambulated entire RN station this date. RW used with fatigue present.   Stairs             Wheelchair Mobility    Modified Rankin (Stroke Patients Only)       Balance Overall balance assessment: Needs assistance Sitting-balance support: Feet supported;No upper extremity supported Sitting balance-Leahy Scale: Good     Standing balance support: Bilateral upper extremity supported Standing balance-Leahy Scale: Good                              Cognition Arousal/Alertness: Awake/alert Behavior During Therapy: WFL for tasks assessed/performed Overall Cognitive Status: Within Functional Limits for tasks assessed                                          Exercises Other Exercises Other Exercises: IS performed x 10 reps consistently hitting 444mL    General Comments        Pertinent Vitals/Pain Pain Assessment: No/denies pain    Home Living                          Prior Function            PT Goals (current goals can now be found in the care plan section) Acute Rehab PT Goals Patient Stated Goal: to  get stronger PT Goal Formulation: With patient Time For Goal Achievement: 05/10/21 Potential to Achieve Goals: Good Progress towards PT goals: Progressing toward goals    Frequency    Min 2X/week      PT Plan Current plan remains appropriate    Co-evaluation              AM-PAC PT "6 Clicks" Mobility   Outcome Measure  Help needed turning from your back to your side while in a flat bed without using bedrails?: None Help needed moving from lying on your  back to sitting on the side of a flat bed without using bedrails?: None Help needed moving to and from a bed to a chair (including a wheelchair)?: A Little Help needed standing up from a chair using your arms (e.g., wheelchair or bedside chair)?: A Little Help needed to walk in hospital room?: A Little Help needed climbing 3-5 steps with a railing? : A Little 6 Click Score: 20    End of Session Equipment Utilized During Treatment: Gait belt Activity Tolerance: Patient limited by fatigue Patient left: in chair Nurse Communication: Mobility status PT Visit Diagnosis: Muscle weakness (generalized) (M62.81);Difficulty in walking, not elsewhere classified (R26.2)     Time: 3151-7616 PT Time Calculation (min) (ACUTE ONLY): 23 min  Charges:  $Gait Training: 23-37 mins                     Greggory Stallion, [place], DPT [phone]    Dalten Ambrosino 04/30/2021, 2:29 PM

## 2021-04-30 NOTE — Progress Notes (Signed)
Occupational Therapy Treatment Patient Details Name: Mary Washington MRN: [id number] DOB: 03/26/1946 Today's Date: 04/30/2021   History of present illness Pt admitted for acute diverticulitis with pelvic abscess and SBO. Pt is now s/p partial colectomy. 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   OT comments  Pt seen for OT tx this date. Pt shares that she just got back to bed after working with PT earlier and sitting up in the recliner for a while. Pt does endorse some fatigue but agreeable to toileting task with OT. Pt performs bed mobility with modified independence, increased time/effort with use of bed rails to perform. SBA for transition from EOB to [facility] with UE support on [facility] rail for stability. Mod indep with clothing mgt and hygiene. Pt endorsed moderate fatigue afterwards. HR 115 once returned to bed. Pt progressing, continues to benefit from skilled OT services to maximize safety/independence with ADL.    Recommendations for follow up therapy are one component of a multi-disciplinary discharge planning process, led by the attending physician.  Recommendations may be updated based on patient status, additional functional criteria and insurance authorization.    Follow Up Recommendations  Home health OT    Assistance Recommended at Discharge PRN  Equipment Recommendations  BSC/3in1    Recommendations for Other Services      Precautions / Restrictions Precautions Precautions: Fall Precaution Comments: mod fall Restrictions Weight Bearing Restrictions: No Other Position/Activity Restrictions: abdominal incision, colostomy       Mobility Bed Mobility Overal bed mobility: Modified Independent Bed Mobility: Supine to Sit;Sit to Supine     Supine to sit: Modified independent (Device/Increase time) Sit to supine: Modified independent (Device/Increase time)   General bed mobility comments: increased time/effort    Transfers Overall transfer level: Modified  independent Equipment used: None Transfers: Sit to/from Stand;Bed to chair/wheelchair/BSC Sit to Stand: Modified independent (Device/Increase time);Supervision   Step pivot transfers: Supervision             Balance Overall balance assessment: Needs assistance Sitting-balance support: Feet supported;No upper extremity supported Sitting balance-Leahy Scale: Good     Standing balance support: Single extremity supported;During functional activity Standing balance-Leahy Scale: Fair                             ADL either performed or assessed with clinical judgement   ADL Overall ADL's : Needs assistance/impaired                         Toilet Transfer: Supervision/safety;Min Dispensing optician Details (indicate cue type and reason): SBA for step pivot with UE support on the arm rests of the [facility] Toileting- Clothing Manipulation and Hygiene: Sitting/lateral lean;Set up              Extremity/Trunk Assessment Upper Extremity Assessment Upper Extremity Assessment: Generalized weakness   Lower Extremity Assessment Lower Extremity Assessment: Generalized weakness        Vision       Perception     Praxis      Cognition Arousal/Alertness: Awake/alert Behavior During Therapy: WFL for tasks assessed/performed Overall Cognitive Status: Within Functional Limits for tasks assessed                                            Exercises     Shoulder  Instructions       General Comments      Pertinent Vitals/ Pain       Pain Assessment: No/denies pain  Home Living                                          Prior Functioning/Environment              Frequency  Min 2X/week        Progress Toward Goals  OT Goals(current goals can now be found in the care plan section)  Progress towards OT goals: Progressing toward goals  Acute Rehab OT Goals Patient Stated Goal: get back to eating  real food then go home OT Goal Formulation: With patient Time For Goal Achievement: 05/10/21 Potential to Achieve Goals: Good  Plan Discharge plan remains appropriate;Frequency remains appropriate    Co-evaluation                 AM-PAC OT "6 Clicks" Daily Activity     Outcome Measure   Help from another person eating meals?: None Help from another person taking care of personal grooming?: None Help from another person toileting, which includes using toliet, bedpan, or urinal?: A Little Help from another person bathing (including washing, rinsing, drying)?: A Little Help from another person to put on and taking off regular upper body clothing?: None Help from another person to put on and taking off regular lower body clothing?: A Little 6 Click Score: 21    End of Session    OT Visit Diagnosis: Other abnormalities of gait and mobility (R26.89);Muscle weakness (generalized) (M62.81)   Activity Tolerance Patient tolerated treatment well   Patient Left in bed;with call bell/phone within reach;with bed alarm set   Nurse Communication          Time: 2703-5009 OT Time Calculation (min): 10 min  Charges: OT General Charges $OT Visit: 1 Visit OT Treatments $Self Care/Home Management : 8-22 mins  Ardeth Perfect., MPH, MS, OTR/L ascom [phone] 04/30/21, 12:43 PM

## 2021-04-30 NOTE — Plan of Care (Signed)

## 2021-04-30 NOTE — Progress Notes (Signed)
PHARMACY - TOTAL PARENTERAL NUTRITION CONSULT NOTE   Indication: Prolonged ileus  Patient Measurements: Height: 5\' 2"  (157.5 cm) Weight: 46.6 kg (102 lb 11.8 oz) IBW/kg (Calculated) : 50.1 TPN AdjBW (KG): 51.7 Body mass index is 18.79 kg/m.  Assessment: 75 y.o. 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  Glucose / Insulin: BG/24h 113 - 154 (3 units SSI required) Electrolytes: hypokalemia Renal: SCr<1, stable Hepatic: LFTs wnl, TG wnl  Intake / Output; MIVF: no MIVF GI Imaging: 11/17 CT Abd: Sigmoid diverticulitis GI Surgeries / Procedures:  11/23: partial colectomy with end colostomy creation and small bowel resection with anastomosis  Central access: 11/23/20 TPN start date: 04/19/21  Nutritional Goals: Goal TPN rate is 75 mL/hr (provides 93 g of protein and 1788 kcals per day)  RD Assessment: Estimated Needs Total Energy Estimated Needs: 1700-1900kcal/day Total Protein Estimated Needs: 85-95g/day Total Fluid Estimated Needs: 1.3-1.5L/day  Current Nutrition: CLD  Plan:  Continue TPN at 75 mL/hr  Total volume including overfill 1900 mL Nutritional Components: Amino acids (using 10% Travasol): 93.6 grams  Dextrose: 270 grams Lipids (using 20% SMOFlipid): 49.6 grams Total kCal: 1788 / 24h Electrolytes in TPN: Na 120 mEq/L, K 50 mEq/L, Ca 5 mEq/L, Mg 5 mEq/L, and Phos 10 mmol/L. Cl:Ac 1:1 Add standard MVI and trace elements to TPN 40 mEq po KCl x 2 Continue Sensitive q6h SSI and adjust as needed  Monitor TPN labs on Mon/Thurs, daily until stable  Dallie Piles, PharmD Clinical Pharmacist 04/30/2021 7:29 AM

## 2021-04-30 NOTE — Progress Notes (Signed)
Patient ID: Mary Washington, female   DOB: 1945-11-29, 75 y.o.   MRN: [id number]     [place] Day(s): 15.   Interval History: Patient seen and examined, no acute events or new complaints overnight. Patient reports feeling okay.  She denies any nausea with a clear liquids.  She is still passing adequate gas but no stool.  Vital signs in last 24 hours: [min-max] current  Temp:  [98.2 F (36.8 C)-99.6 F (37.6 C)] 99.6 F (37.6 C) (11/29 0743) Pulse Rate:  [118-130] 124 (11/29 0743) Resp:  [18-20] 20 (11/29 0743) BP: (138-147)/(57-74) 141/57 (11/29 0743) SpO2:  [97 %-98 %] 97 % (11/29 0743)     Height: 5\' 2"  (157.5 cm) Weight: 46.6 kg BMI (Calculated): 18.79   Physical Exam:  Constitutional: alert, cooperative and no distress  Respiratory: breathing non-labored at rest  Cardiovascular: Tachycardic Gastrointestinal: soft, non-tender, and non-distended.  Colostomy pink and patent with edema  Labs:  CBC Latest Ref Rng & Units 04/30/2021 04/29/2021 04/28/2021  WBC 4.0 - 10.5 K/uL 8.7 11.4(H) 12.8(H)  Hemoglobin 12.0 - 15.0 g/dL 8.6(L) 8.9(L) 9.4(L)  Hematocrit 36.0 - 46.0 % 26.7(L) 28.0(L) 30.2(L)  Platelets 150 - 400 K/uL 316 395 469(H)   CMP Latest Ref Rng & Units 04/30/2021 04/29/2021 04/29/2021  Glucose 70 - 99 mg/dL 142(H) - 156(H)  BUN 8 - 23 mg/dL 18 - 23  Creatinine 0.44 - 1.00 mg/dL 0.57 - 0.64  Sodium 135 - 145 mmol/L 140 - 147(H)  Potassium 3.5 - 5.1 mmol/L 3.2(L) 3.0(L) 2.6(LL)  Chloride 98 - 111 mmol/L 110 - 111  CO2 22 - 32 mmol/L 26 - 29  Calcium 8.9 - 10.3 mg/dL 8.1(L) - 8.5(L)  Total Protein 6.5 - 8.1 g/dL - - 5.4(L)  Total Bilirubin 0.3 - 1.2 mg/dL - - 0.2(L)  Alkaline Phos 38 - 126 U/L - - 85  AST 15 - 41 U/L - - 27  ALT 0 - 44 U/L - - 22    Imaging studies: No new pertinent imaging studies   Assessment/Plan:  75 y.o. female with perforated diverticulitis with pelvic abscess and small bowel obstruction 6 Day Post-Op s/p partial  colectomy with end colostomy creation and small bowel resection with anastomosis, complicated by pertinent comorbidities including diet and B-cell lymphoma on chemotherapy.  Patient today continue passing gas.  No stool yet.  No nausea.  We will remove NGT for comfort.  Still not eating of advance to full liquids.  We will continue with TPN.  I will add MiraLAX to loosen the stool.  White blood cell count came back to normal.  No fever.  No explanation for tachycardia at this moment.  Low suspicion of infection at this moment.  I encouraged the patient to ambulate.  Encourage the patient to continue incentive spirometer.  Appreciate hospitalist management of medical comorbidities.  [name], MD

## 2021-04-30 NOTE — Progress Notes (Signed)
Nutrition Follow Up Note   DOCUMENTATION CODES:   Not applicable  INTERVENTION:   Continue TPN per pharmacy   Daily weights   Boost Breeze po TID, each supplement provides 250 kcal and 9 grams of protein  NUTRITION DIAGNOSIS:   Inadequate oral intake related to acute illness as evidenced by NPO status. -ongoing   GOAL:   Patient will meet greater than or equal to 90% of their needs -met with TPN  MONITOR:   PO intake, Supplement acceptance, Diet advancement, Labs, Weight trends, Skin, I & O's, TPN  ASSESSMENT:   75 y.o. 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 Pt reports poor oral intake in hospital. Pt reports that she gets full easily and pt is taking some medications such as miralax that contain large volumes which is making her full. Pt drinking mainly juice and eating a few sips of broth with meals. Pt continues on goal rate of TPN and is tolerating well. Per chart, pt is down 11lbs(10%) since admission; pt +6.4L on her I & O.s. Calories increased in the TPN on 11/23; will plan to increase calories again on 11/30. Pt initiated on a clear liquid diet on 11/27; RD will add Boost Breeze. Pt reports frequent passing of gas; RD can hear gas passing into ostomy bag at time of RD visit. Pt has not yet had a BM. Pt reports minimal pain today. Recommend continue TPN until pt is able to meet at least 75% of her estimated needs via oral intake and supplements as pt has continued to loose weight. NFPE completed today and is mostly unchanged with exception of increased muscle wasting if her LE. Pt is working with therapy but noted to  need encouragement to ambulate.   Medications reviewed and include: lovenox, insulin, protonix, miralax, KCl, unasyn  Labs reviewed: K 3.2(L) P 3.2 wnl, Mg 2.0 wnl- 11/28 Triglycerides 79- 11/28 Hgb 8.6(L), Hct 26.7(L) cbgs- 119, 154, 138 x 24 hrs  Nutrition Focused Physical Exam:  Flowsheet Row Most Recent Value  Orbital Region No depletion  Upper Arm Region No depletion  Thoracic and Lumbar Region No depletion  Buccal Region No depletion  Temple Region Mild depletion  Clavicle Bone Region Moderate depletion  Clavicle and Acromion Bone Region Moderate depletion  Scapular Bone Region Mild depletion  Dorsal Hand Moderate depletion  Patellar Region Severe depletion  Anterior Thigh Region Severe depletion  Posterior Calf Region Severe depletion  Edema (RD Assessment) None  Hair Reviewed  Eyes Reviewed  Mouth Reviewed  Skin Reviewed  Nails Reviewed   Diet Order:   Diet Order             Diet clear liquid Room service appropriate? Yes; Fluid consistency: Thin  Diet effective now                  EDUCATION NEEDS:   Education needs have been addressed  Skin:  Skin Assessment: Reviewed RN Assessment  Last BM:  11/22- type 3  Height:   Ht Readings from Last 1 Encounters:  04/15/21 5' 2" (1.575 m)    Weight:   Wt Readings from Last 1 Encounters:  04/29/21 46.6 kg    Ideal  Body Weight:  50 kg  BMI:  Body mass index is 18.79 kg/m.  Estimated Nutritional Needs:   Kcal:  1700-2000kcal/day  Protein:  85-100g/day  Fluid:  1.3-1.5L/day  Koleen Distance MS, RD, LDN Please refer to [facility] for RD and/or RD on-call/weekend/after hours pager

## 2021-04-30 NOTE — Progress Notes (Signed)
PROGRESS NOTE   HPI was taken from Dr. Francine Graven: Mary Washington is a 75 y.o. 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 Patient states that she gets chemotherapy every 3 weeks and her last treatment was 1 week ago on 11/07.  She states that following the treatment she developed constipation and had to take several days of MiraLAX without any significant improvement.  She called over to her oncologist office and recommended she use a suppository which she did with minimal improvement and then she had to call the office hotline and was recommended lactulose.  She states that she has had multiple episodes of small loose stools but continues to feel bloated and uncomfortable.  She has had nausea and anorexia but denies having any emesis.  She is passing flatus. She denies having any fever or chills, no abdominal pain, no urinary symptoms, no cough, no shortness of breath, no chest pain, no dizziness, no lightheadedness, no palpitations, no lower extremity swelling, no blurred vision or focal deficit. Labs show sodium 123, potassium 3.7, chloride 88, bicarb 26, glucose 96, BUN 9, creatinine 0.49, calcium 9.0, alkaline phosphatase 88, albumin 3.5, lipase 20, AST 23, ALT 14, total protein 6.2, lactic acid 1.5, Respiratory viral panel is negative Urinalysis is sterile CT scan of abdomen and pelvis is positive for acute sigmoid diverticulitis. Two small interloop fluid collections are identified within the pelvis concerning for perforation with early abscess formation. Signs of bowel obstruction and/or ileus identified with transition to decreased caliber distal small bowel loops within the right lower quadrant of the abdomen.Aortic Atherosclerosis.    As per Dr. Fritzi Mandes: Mary Washington is a 75 y.o. 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 Patient states that she gets chemotherapy every 3 weeks and her last treatment was 1 week ago on 11/07.  She states that following the treatment she developed constipation and had to take several days of MiraLAX without any significant improvement.   As per Dr. Jimmye Norman 11/23-11/29/22: Pt was found to have acute diverticulitis w/ pelvic abscesses & SBO. Pt is s/p  sigmoid colon resection w/ colostomy, small bowel resection w/ anastomosis, excision of mesenteric mass & draining of multiple abscess. Mesenteric mass sent to pathology & pending. Intra-abd abscess cx growing e. coli. Continue on IV unasyn as per ID. NG tube was removed today but pt is still on TPN & has clear liquid diet as per general surg. Pt has gas in ostomy but still no stool output. Pt was started on miralax as per general surg. PT/OT evaluated the pt and recs home health. Pt is encouraged to walk and sit up in chair daily.         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 S/p sigmoid colon resection w/ colostomy, small bowel resection w/ anastomosis, excision of mesenteric mass & draining of multiple abscess. Mesenteric mass sent to pathology &  pending. Intra-abd abscess cx growing e. coli. Continue on IV unasyn as per ID. NG tube removed today. Continue on TPN & clear liquid diet as per pt. Gas in ostomy but still no stool. Started on miralax today as per general surg. General surg and recs apprec   Leukocytosis:  resolved  Normocytic anemia: likely secondary to recent chemo & lymphoma.H&H are trending down. Will transfuse if Hb < 7.0  Thrombocytosis: resolved   Hyponatremia: resolved   Hypernatremia: resolved    Hypokalemia: KCL repleated    Hx of diffuse large B-cell lymphoma: last chemo 2 weeks ago   Generalized weakness: PT/OT recs HH. Encourage ambulating and sitting in a chair daily     DVT prophylaxis: lovenox  Code Status: full  Family Communication:  Disposition Plan: likely home w/ home health   Level of care: Telemetry Medical  Status is: Inpatient  Remains inpatient appropriate because: severity of illness as stated above    Consultants:  General surg Onco   Procedures:   Antimicrobials: unasyn    Subjective: Pt c/o feeling full quickly   Objective: Vitals:   04/29/21 0837 04/29/21 1609 04/29/21 2010 04/30/21 0743  BP: (!) 147/61 138/67 (!) 145/74 (!) 141/57  Pulse: (!) 118 (!) 120 (!) 130 (!) 124  Resp: 20 18 18 20   Temp: 98.2 F (36.8 C) 99.1 F (37.3 C) 98.9 F (37.2 C) 99.6 F (37.6 C)  TempSrc: Oral Oral Oral Oral  SpO2: 97% 98% 98% 97%  Weight:      Height:        Intake/Output Summary (Last 24 hours) at 04/30/2021 0833 Last data filed at 04/30/2021 3154 Gross per 24 hour  Intake 3061.72 ml  Output 50 ml  Net 3011.72 ml   Filed Weights   04/25/21 0550 04/26/21 0500 04/29/21 0542  Weight: 47.2 kg 49.3 kg 46.6 kg    Examination:  General exam: Appears uncomfortable  Respiratory system: clear breath sounds b/l  Cardiovascular system: S1 & S2+. No rubs or clicks  Gastrointestinal system: Abd is soft, NT, ND & hypoactive bowel sounds  Central nervous system: alert and oriented. Moves all extremities  Psychiatry: judgement and insight appears normal. Flat mood and affect     Data Reviewed: I have personally reviewed following labs and imaging studies  CBC: Recent Labs  Lab 04/26/21 0610 04/27/21 0428 04/28/21 0434  04/29/21 0608 04/30/21 0646  WBC 12.9* 15.5* 12.8* 11.4* 8.7  NEUTROABS 9.6* 12.5* 10.8* 9.8* 7.3  HGB 9.1* 9.0* 9.4* 8.9* 8.6*  HCT 28.5* 28.6* 30.2* 28.0* 26.7*  MCV 91.9 90.5 92.1 90.3 90.8  PLT 494* 451* 469* 395 008   Basic Metabolic Panel: Recent Labs  Lab 04/24/21 0412 04/25/21 0440 04/29/21 0608 04/29/21 1818 04/30/21 0646  NA 135 138 147*  --  140  K 3.7 4.0 2.6* 3.0* 3.2*  CL 102 108 111  --  110  CO2 27 26 29   --  26  GLUCOSE 122* 165* 156*  --  142*  BUN 19 25* 23  --  18  CREATININE 0.48 0.57 0.64  --  0.57  CALCIUM 8.8* 8.5* 8.5*  --  8.1*  MG 2.1 2.0 2.0  --   --   PHOS  --  3.0 3.2  --   --    GFR: Estimated Creatinine Clearance: 45.4 mL/min (by C-G formula based on SCr of 0.57 mg/dL). Liver Function Tests: Recent Labs  Lab 04/25/21 0440 04/29/21 0608  AST 17 27  ALT  13 22  ALKPHOS 63 85  BILITOT 0.3 0.2*  PROT 4.9* 5.4*  ALBUMIN 2.3* 2.1*   No results for input(s): LIPASE, AMYLASE in the last 168 hours. No results for input(s): AMMONIA in the last 168 hours. Coagulation Profile: No results for input(s): INR, PROTIME in the last 168 hours. Cardiac Enzymes: No results for input(s): CKTOTAL, CKMB, CKMBINDEX, TROPONINI in the last 168 hours. BNP (last 3 results) No results for input(s): PROBNP in the last 8760 hours. HbA1C: No results for input(s): HGBA1C in the last 72 hours. CBG: Recent Labs  Lab 04/29/21 0425 04/29/21 1137 04/29/21 1740 04/30/21 0035 04/30/21 0543  GLUCAP 137* 133* 113* 138* 154*   Lipid Profile: Recent Labs    04/29/21 0608  TRIG 79    Thyroid Function Tests: No results for input(s): TSH, T4TOTAL, FREET4, T3FREE, THYROIDAB in the last 72 hours. Anemia Panel: No results for input(s): VITAMINB12, FOLATE, FERRITIN, TIBC, IRON, RETICCTPCT in the last 72 hours. Sepsis Labs: No results for input(s): PROCALCITON, LATICACIDVEN in the last 168 hours.  Recent Results (from the past 240 hour(s))  Aerobic/Anaerobic  Culture w Gram Stain (surgical/deep wound)     Status: None   Collection Time: 04/24/21  2:26 PM   Specimen: PATH Other; Tissue  Result Value Ref Range Status   Specimen Description   Final    ABSCESS Performed at [facility], [address] [place], [place] [address]    Special Requests   Final    PELVIC ABSCESS Performed at [facility], [address] [place], [place] [address]    Gram Stain   Final    FEW WBC PRESENT, PREDOMINANTLY MONONUCLEAR FEW GRAM POSITIVE RODS RARE GRAM NEGATIVE RODS    Culture   Final    RARE ESCHERICHIA COLI RARE KLEBSIELLA PNEUMONIAE RARE BACTEROIDES FRAGILIS BETA LACTAMASE POSITIVE Performed at [facility], [place] [address] [place], [place] [address]    Report Status 04/28/2021 FINAL  Final   Organism ID, Bacteria ESCHERICHIA COLI  Final   Organism ID, Bacteria KLEBSIELLA PNEUMONIAE  Final      Susceptibility   Escherichia coli - MIC*    AMPICILLIN 8 SENSITIVE Sensitive     CEFAZOLIN <=4 SENSITIVE Sensitive     CEFEPIME <=0.12 SENSITIVE Sensitive     CEFTAZIDIME <=1 SENSITIVE Sensitive     CEFTRIAXONE <=0.25 SENSITIVE Sensitive     CIPROFLOXACIN <=0.25 SENSITIVE Sensitive     GENTAMICIN <=1 SENSITIVE Sensitive     IMIPENEM <=0.25 SENSITIVE Sensitive     TRIMETH/SULFA <=20 SENSITIVE Sensitive     AMPICILLIN/SULBACTAM 4 SENSITIVE Sensitive     PIP/TAZO <=4 SENSITIVE Sensitive     * RARE ESCHERICHIA COLI   Klebsiella pneumoniae - MIC*    AMPICILLIN RESISTANT Resistant     CEFAZOLIN <=4 SENSITIVE Sensitive     CEFEPIME <=0.12 SENSITIVE Sensitive     CEFTAZIDIME <=1 SENSITIVE Sensitive     CEFTRIAXONE <=0.25 SENSITIVE Sensitive     CIPROFLOXACIN <=0.25 SENSITIVE Sensitive     GENTAMICIN <=1 SENSITIVE Sensitive     IMIPENEM <=0.25 SENSITIVE Sensitive     TRIMETH/SULFA <=20 SENSITIVE Sensitive     AMPICILLIN/SULBACTAM 4 SENSITIVE Sensitive     PIP/TAZO <=4 SENSITIVE Sensitive     * RARE KLEBSIELLA PNEUMONIAE          Radiology Studies: No results found.      Scheduled Meds:  Chlorhexidine Gluconate Cloth  6 each Topical Daily   enoxaparin (LOVENOX) injection  40 mg Subcutaneous Q24H   insulin aspart  0-9 Units Subcutaneous Q6H   lidocaine-prilocaine   Topical Once   pantoprazole (PROTONIX) IV  40 mg Intravenous QHS   polyethylene glycol  17 g Oral Daily   potassium chloride  40 mEq Oral TID   Continuous Infusions:  sodium chloride Stopped (04/26/21 2323)   acyclovir 265 mg (04/30/21 0825)   ampicillin-sulbactam (UNASYN) IV 3 g (04/30/21 0548)   TPN ADULT (ION) 75 mL/hr at 04/29/21 1807     LOS: 15 days    Time spent: 15 mins     Wyvonnia Dusky, MD [facility] Pager [phone]  If 7PM-7AM, please contact night-coverage 04/30/2021, 8:33 AM

## 2021-05-01 LAB — CBC WITH DIFFERENTIAL/PLATELET
Abs Immature Granulocytes: 0.22 10*3/uL — ABNORMAL HIGH (ref 0.00–0.07)
Basophils Absolute: 0.1 10*3/uL (ref 0.0–0.1)
Basophils Relative: 1 %
Eosinophils Absolute: 0.2 10*3/uL (ref 0.0–0.5)
Eosinophils Relative: 2 %
HCT: 26.4 % — ABNORMAL LOW (ref 36.0–46.0)
Hemoglobin: 8.6 g/dL — ABNORMAL LOW (ref 12.0–15.0)
Immature Granulocytes: 3 %
Lymphocytes Relative: 7 %
Lymphs Abs: 0.6 10*3/uL — ABNORMAL LOW (ref 0.7–4.0)
MCH: 28.7 pg (ref 26.0–34.0)
MCHC: 32.6 g/dL (ref 30.0–36.0)
MCV: 88 fL (ref 80.0–100.0)
Monocytes Absolute: 0.5 10*3/uL (ref 0.1–1.0)
Monocytes Relative: 6 %
Neutro Abs: 6.8 10*3/uL (ref 1.7–7.7)
Neutrophils Relative %: 81 %
Platelets: 285 10*3/uL (ref 150–400)
RBC: 3 MIL/uL — ABNORMAL LOW (ref 3.87–5.11)
RDW: 16.4 % — ABNORMAL HIGH (ref 11.5–15.5)
WBC: 8.4 10*3/uL (ref 4.0–10.5)
nRBC: 0 % (ref 0.0–0.2)

## 2021-05-01 LAB — BASIC METABOLIC PANEL
Anion gap: 3 — ABNORMAL LOW (ref 5–15)
BUN: 17 mg/dL (ref 8–23)
CO2: 25 mmol/L (ref 22–32)
Calcium: 8.2 mg/dL — ABNORMAL LOW (ref 8.9–10.3)
Chloride: 104 mmol/L (ref 98–111)
Creatinine, Ser: 0.38 mg/dL — ABNORMAL LOW (ref 0.44–1.00)
GFR, Estimated: >60 mL/min (ref >60)
Glucose, Bld: 147 mg/dL — ABNORMAL HIGH (ref 70–99)
Potassium: 3.5 mmol/L (ref 3.5–5.1)
Sodium: 132 mmol/L — ABNORMAL LOW (ref 135–145)

## 2021-05-01 LAB — GLUCOSE, CAPILLARY
Glucose-Capillary: 137 mg/dL — ABNORMAL HIGH (ref 70–99)
Glucose-Capillary: 149 mg/dL — ABNORMAL HIGH (ref 70–99)
Glucose-Capillary: 143 mg/dL — ABNORMAL HIGH (ref 70–99)

## 2021-05-01 MED ORDER — PANTOPRAZOLE SODIUM 40 MG PO TBEC
40.0000 mg | DELAYED_RELEASE_TABLET | Freq: Every day | ORAL | Status: DC
  Administered 2021-05-01 – 2021-05-02 (×2): 40 mg via ORAL
  Filled 2021-05-01 (×2): qty 1

## 2021-05-01 MED ORDER — SODIUM CHLORIDE 0.9% FLUSH
10.0000 mL | Freq: Two times a day (BID) | INTRAVENOUS | Status: DC
  Administered 2021-05-02 – 2021-05-03 (×4): 10 mL

## 2021-05-01 MED ORDER — TRAVASOL 10 % IV SOLN
INTRAVENOUS | Status: AC
  Filled 2021-05-01: qty 936

## 2021-05-01 MED ORDER — METOPROLOL TARTRATE 25 MG PO TABS
12.5000 mg | ORAL_TABLET | Freq: Two times a day (BID) | ORAL | Status: DC
  Administered 2021-05-01 – 2021-05-02 (×2): 12.5 mg via ORAL
  Filled 2021-05-01 (×2): qty 1

## 2021-05-01 MED ORDER — ENSURE ENLIVE PO LIQD
237.0000 mL | Freq: Three times a day (TID) | ORAL | Status: DC
  Administered 2021-05-01 – 2021-05-03 (×4): 237 mL via ORAL

## 2021-05-01 MED ORDER — SODIUM CHLORIDE 0.9% FLUSH: 10.0000 mL | INTRAVENOUS | Status: DC | PRN

## 2021-05-01 NOTE — TOC Initial Note (Signed)
Transition of Care [facility]) - Initial/Assessment Note    Patient Details  Name: Mary Washington MRN: [id number] Date of Birth: Oct 29, 1945  Transition of [facility]) CM/SW Contact:    Mary Chroman, LCSW Phone Number: 05/01/2021, 2:13 PM  Clinical Narrative: CSW met with patient. No supports at bedside. CSW introduced role and explained that therapy recommendations would be discussed. Patient is agreeable to home health services. Provided CMS scores for agencies that serve her zip code. No preference. [name] is reviewing referral. She is agreeable to 3-in-1. She does have a shower chair. No further concerns. CSW encouraged patient to contact CSW as needed. CSW will continue to follow patient for support and facilitate return home when stable, likely Friday.                 Expected Discharge Plan: [place] Barriers to Discharge: Continued Medical Work up   Patient Goals and CMS Choice   CMS Medicare.gov Compare Post Acute Care list provided to:: Patient    Expected Discharge Plan and Services Expected Discharge Plan: [place] Choice: Durable Medical Equipment, Home Health Living arrangements for the past 2 months: Single Family Home                                      Prior Living Arrangements/Services Living arrangements for the past 2 months: Single Family Home Lives with:: Spouse Patient language and need for interpreter reviewed:: Yes Do you feel safe going back to the place where you live?: Yes      Need for Family Participation in Patient Care: Yes (Comment) Care giver support system in place?: Yes (comment) Current home services: DME Criminal Activity/Legal Involvement Pertinent to Current Situation/Hospitalization: No - Comment as needed  Activities of Daily Living Home Assistive Devices/Equipment: None ADL Screening (condition at time of admission) Patient's cognitive ability adequate to safely complete  daily activities?: Yes Is the patient deaf or have difficulty hearing?: No Does the patient have difficulty seeing, even when wearing glasses/contacts?: No Does the patient have difficulty concentrating, remembering, or making decisions?: No Patient able to express need for assistance with ADLs?: Yes Does the patient have difficulty dressing or bathing?: No Independently performs ADLs?: Yes (appropriate for developmental age) Does the patient have difficulty walking or climbing stairs?: Yes Weakness of Legs: None Weakness of Arms/Hands: None  Permission Sought/Granted Permission sought to share information with : Facility Art therapist granted to share information with : Yes, Verbal Permission Granted     Permission granted to share info w AGENCY: Home Health Agencies        Emotional Assessment Appearance:: Appears stated age Attitude/Demeanor/Rapport: Engaged, Gracious Affect (typically observed): Accepting, Appropriate, Calm, Pleasant Orientation: : Oriented to Self, Oriented to Place, Oriented to  Time, Oriented to Situation Alcohol / Substance Use: Not Applicable Psych Involvement: No (comment)  Admission diagnosis:  Hyponatremia [E87.1] Chemotherapy-induced neutropenia (HCC) [D70.1, T45.1X5A] Diverticulitis large intestine w/o perforation or abscess w/o bleeding [K57.32] Acute diverticulitis [K57.92] Sepsis, due to unspecified organism, unspecified whether acute organ dysfunction present [facility]) [A41.9] Patient Active Problem List   Diagnosis Date Noted   Acute diverticulitis 04/15/2021   Hyponatremia 04/15/2021   Antineoplastic chemotherapy induced pancytopenia ([place]) 04/15/2021   SBO (small bowel obstruction) ([place]) 04/15/2021   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  [place], [place] [address] [place] [address] [place] [address] Phone: [phone] Fax: [phone]     Social Determinants of Health ([place]) Interventions    Readmission Risk Interventions Readmission Risk Prevention Plan 05/01/2021  PCP or Specialist Appt within 3-5 Days Complete  Social Work Consult for [place] Planning/Counseling Complete  Palliative Care Screening Not Applicable  Some recent data might be hidden

## 2021-05-01 NOTE — Progress Notes (Signed)
   05/01/21 0829  Assess: MEWS Score  Temp 97.8 F (36.6 C)  BP (!) 127/58  Pulse Rate (!) 116  Resp 18  Level of Consciousness Alert  SpO2 99 %  O2 Device Room Air  Assess: MEWS Score  MEWS Temp 0  MEWS Systolic 0  MEWS Pulse 2  MEWS RR 0  MEWS LOC 0  MEWS Score 2  MEWS Score Color Yellow  Assess: if the MEWS score is Yellow or Red  Were vital signs taken at a resting state? Yes  Focused Assessment No change from prior assessment  Does the patient meet 2 or more of the SIRS criteria? No  MEWS guidelines implemented *See Row Information* No, previously yellow, continue vital signs every 4 hours (pt has been yellow MEWS intermittently)  Treat  MEWS Interventions Other (Comment) (no interventions at this time)  Pain Scale 0-10  Pain Score 0  Take Vital Signs  Increase Vital Sign Frequency   (not applicable, already on q4 hours VS)  Escalate  MEWS: Escalate Yellow: discuss with charge nurse/RN and consider discussing with provider and RRT (discussed with Charge RN only)  Notify: Charge Nurse/RN  Name of Charge Nurse/RN Notified Deetta Perla RN  Date Charge Nurse/RN Notified 05/01/21  Time Charge Nurse/RN Notified 0930  Notify: Provider  Provider Name/Title Dr. Pietro Cassis  Date Provider Notified 05/01/21  Time Provider Notified 1128  Notification Type Call  Notification Reason Other (Comment) (yellow MEWS)  Provider response No new orders;In department  Date of Provider Response 05/01/21  Time of Provider Response 1130  Notify: Rapid Response  Name of Rapid Response RN Notified not applicable  Date Rapid Response Notified  (not applicable)  Time Rapid Response Notified  (not applicable)  Document  Patient Outcome Other (Comment) (Patient has been stable, tolerating daily activities, no complaints offered, no distress.)  Progress note created (see row info) Yes  Assess: SIRS CRITERIA  SIRS Temperature  0  SIRS Pulse 1  SIRS Respirations  0  SIRS WBC 0  SIRS Score  Sum  1

## 2021-05-01 NOTE — Progress Notes (Signed)
PHARMACY - TOTAL PARENTERAL NUTRITION CONSULT NOTE   Indication: Prolonged ileus  Patient Measurements: Height: 5\' 2"  (157.5 cm) Weight: 46.6 kg (102 lb 11.8 oz) IBW/kg (Calculated) : 50.1 TPN AdjBW (KG): 51.7 Body mass index is 18.79 kg/m.  Assessment: 75 y.o. 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  Glucose / Insulin: BG/24h 119 - 154 (3 units SSI required) Electrolytes: Hypokalemia resolved. Mild hyponatremia Renal: Scr < 1, stable Hepatic: LFTs wnl, TG wnl  Intake / Output; MIVF: No MIVF GI Imaging: 11/17 CT Abd: Sigmoid diverticulitis GI Surgeries / Procedures:  11/23: Partial colectomy with end colostomy creation and small bowel resection with anastomosis  Central access: 11/23/20 TPN start date: 04/19/21  Nutritional Goals: Goal TPN rate is 75 mL/hr (provides 93 g of protein and 1788 kcals per day)  RD Assessment: Estimated Needs Total Energy Estimated Needs: 1700-2000kcal/day Total Protein Estimated Needs: 85-100g/day Total Fluid Estimated Needs: 1.3-1.5L/day  Current Nutrition: Full liquid  Plan:  Continue TPN at 75 mL/hr. Per discussion with surgeon, plan to wean off TPN 12/1 Total volume including overfill 1900 mL Nutritional Components: Amino acids (using 10% Travasol): 93.6 grams  Dextrose: 270 grams Lipids (using 20% SMOFlipid): 49.6 grams Total kCal: 1788 / 24h Electrolytes in TPN: Na 50 mEq/L, K 50 mEq/L, Ca 5 mEq/L, Mg 5 mEq/L, and Phos 10 mmol/L. Cl:Ac 1:1 Add standard MVI and trace elements to TPN Continue Sensitive q6h SSI and adjust as needed  Monitor TPN labs on Mon/Thurs, daily until stable  Benita Gutter 05/01/2021 9:24 AM

## 2021-05-01 NOTE — Progress Notes (Signed)
Patient ID: Mary Washington, female   DOB: 1946/03/20, 75 y.o.   MRN: [id number]     [place] [facility] Day(s): 16.   Interval History: Patient seen and examined, no acute events or new complaints overnight. Patient reports feeling better today.  She endorses she has not stool in the bag.  She tolerated clear liquids well.  She denies any nausea.  Vital signs in last 24 hours: [min-max] current  Temp:  [97.4 F (36.3 C)-98.2 F (36.8 C)] 97.8 F (36.6 C) (11/30 0829) Pulse Rate:  [109-116] 116 (11/30 0829) Resp:  [16-19] 18 (11/30 0829) BP: (127-144)/(57-75) 127/58 (11/30 0829) SpO2:  [97 %-99 %] 99 % (11/30 0829)     Height: 5\' 2"  (157.5 cm) Weight: 46.6 kg BMI (Calculated): 18.79   Physical Exam:  Constitutional: alert, cooperative and no distress  Respiratory: breathing non-labored at rest  Cardiovascular: regular rate and sinus rhythm  Gastrointestinal: soft, non-tender, and non-distended.  Colostomy pink and patent.  Stool in bag.  Labs:  CBC Latest Ref Rng & Units 05/01/2021 04/30/2021 04/29/2021  WBC 4.0 - 10.5 K/uL 8.4 8.7 11.4(H)  Hemoglobin 12.0 - 15.0 g/dL 8.6(L) 8.6(L) 8.9(L)  Hematocrit 36.0 - 46.0 % 26.4(L) 26.7(L) 28.0(L)  Platelets 150 - 400 K/uL 285 316 395   CMP Latest Ref Rng & Units 05/01/2021 04/30/2021 04/29/2021  Glucose 70 - 99 mg/dL 147(H) 142(H) -  BUN 8 - 23 mg/dL 17 18 -  Creatinine 0.44 - 1.00 mg/dL 0.38(L) 0.57 -  Sodium 135 - 145 mmol/L 132(L) 140 -  Potassium 3.5 - 5.1 mmol/L 3.5 3.2(L) 3.0(L)  Chloride 98 - 111 mmol/L 104 110 -  CO2 22 - 32 mmol/L 25 26 -  Calcium 8.9 - 10.3 mg/dL 8.2(L) 8.1(L) -  Total Protein 6.5 - 8.1 g/dL - - -  Total Bilirubin 0.3 - 1.2 mg/dL - - -  Alkaline Phos 38 - 126 U/L - - -  AST 15 - 41 U/L - - -  ALT 0 - 44 U/L - - -    Imaging studies: No new pertinent imaging studies   Assessment/Plan:  75 y.o. female with perforated diverticulitis with pelvic abscess and small bowel obstruction 7 Day Post-Op  s/p partial colectomy with end colostomy creation and small bowel resection with anastomosis, complicated by pertinent comorbidities including diet and B-cell lymphoma on chemotherapy.  Patient with ileus resolving.  She has stool in the bag.  We will advance diet to full liquids.  We will advance diet as tolerated.  Discussed case with pharmacy to start weaning TPN.  Encourage patient to ambulate.  If she continues to progress and recover this way, she she will start discharge planning.  I contacted social worker to start the process of discharge planning.  I will continue to follow closely.  [name], MD

## 2021-05-01 NOTE — Progress Notes (Signed)
PROGRESS NOTE  Mary Washington  DOB: February 02, 1946  PCP: [name], MD GEZ:[id number]  DOA: 04/15/2021  LOS: 5 days  Hospital Day: 17  Chief Complaint  Patient presents with   Bloated   Abdominal Pain    Brief narrative: Mary Washington is a 75 y.o. 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 Patient presented to the ED on 11/14 for evaluation of abdominal distention, bloating.  Patient is on chemotherapy every 3 weeks and her last treatment was 1 week prior on 11/07.  Following that chemo, patient started getting constipation and had to take MiraLAX, lactulose without any effect and hence presented to the ED. In the ED, she had a sodium level low at 123 CT scan of abdomen and pelvis showed acute sigmoid diverticulitis. Two small interloop fluid collections were identified within the pelvis concerning for perforation with early abscess formation. Signs of bowel obstruction and/or ileus identified with transition to decreased caliber distal small bowel loops within the right lower quadrant of the abdomen. She was admitted to hospitalist service General surgery consultation was obtained. Patient underwent sigmoid colon resection w/ colostomy, small bowel resection w/ anastomosis, excision of mesenteric mass & draining of multiple abscess. Mesenteric mass was sent to pathology & pending. Intra-abd abscess culture grew E. coli.  Patient was continued on IV Unasyn per ID recommendation.  TPN was started. Postop ileus seems to be improving now.  Subjective: Patient was seen and examined this morning.  Pleasant elderly Caucasian female. Sitting up in chair.  Not in distress.  Felt like she was going to have a bowel movement today.  Assessment/Plan: Acute diverticulitis: w/ pelvic abscess & SBO -S/p sigmoid colon resection w/ colostomy, small bowel resection w/ anastomosis, excision of mesenteric mass & draining of  multiple abscess. -Mesenteric mass sent to pathology & pending.  -Intra-abd abscess cx growing e. coli.  -Continue on IV unasyn as per ID.  -Postop ileus improving.  NG tube removed.  On full liquid diet today.  If continues to tolerate, can advance further and may be able to stop TPN in 1 to 2 days.  General surgery following.   -No fever.  WBC count improving. Recent Labs  Lab 04/27/21 0428 04/28/21 0434 04/29/21 0608 04/30/21 0646 05/01/21 0522  WBC 15.5* 12.8* 11.4* 8.7 8.4   Large B-cell lymphoma on chemotherapy  Chronic anemia -Hemoglobin stable between 8 and 9. -Last chemotherapy on 11/7. -Follow-up with oncology as an outpatient. Recent Labs  Lab 04/27/21 0428 04/28/21 0434 04/29/21 0608 04/30/21 0646 05/01/21 0522  WBC 15.5* 12.8* 11.4* 8.7 8.4  NEUTROABS 12.5* 10.8* 9.8* 7.3 6.8  HGB 9.0* 9.4* 8.9* 8.6* 8.6*  HCT 28.6* 30.2* 28.0* 26.7* 26.4*  MCV 90.5 92.1 90.3 90.8 88.0  PLT 451* 469* 395 316 285   Hyponatremia -Mild, expected to improve with improvement in nutrition Recent Labs  Lab 04/25/21 0440 04/29/21 0608 04/30/21 0646 05/01/21 0522  NA 138 147* 140 132*   Hypokalemia -Potassium level better today. Recent Labs  Lab 04/25/21 0440 04/29/21 0608 04/29/21 1818 04/30/21 0646 05/01/21 0522  K 4.0 2.6* 3.0* 3.2* 3.5  MG 2.0 2.0  --   --   --   PHOS 3.0 3.2  --   --   --    Generalized weakness -PT/OT eval obtained.   -Encourage out of bed and ambulation.  Persistent sinus tachycardia -Her heart rate seems to be consistently higher than 100. -Probably  related to pain, dehydration and electrolyte abnormalities.  Not requiring oxygen.  She has remained on Lovenox subcu for DVT prophylaxis. -I will start on metoprolol 12.5 mg daily and monitor.  Mobility: Encourage ambulation Living condition: Was living at home Goals of care:   Code Status: Full Code  Nutritional status: Body mass index is 18.79 kg/m. Nutrition Problem: Inadequate oral  intake Etiology: acute illness Signs/Symptoms: NPO status Diet:  Diet Order             Diet full liquid Room service appropriate? Yes; Fluid consistency: Thin  Diet effective now                  DVT prophylaxis:  enoxaparin (LOVENOX) injection 40 mg Start: 04/16/21 1800 SCDs Start: 04/15/21 1048   Antimicrobials: Acyclovir twice daily as part of prophylaxis for chemotherapy patient  fluid: IV fluid through TPN Consultants: General surgery Family Communication: None at bedside  Status is: Inpatient  Remains inpatient appropriate because: Improving postop ileus  Dispo: The patient is from: Home              Anticipated d/c is to: Home with home health in 1 to 2 days              Patient currently is not medically stable to d/c.   Difficult to place patient No     Infusions:   sodium chloride Stopped (04/26/21 2323)   TPN ADULT (ION) 75 mL/hr at 04/30/21 1749   TPN ADULT (ION)      Scheduled Meds:  acyclovir  400 mg Oral BID   Chlorhexidine Gluconate Cloth  6 each Topical Daily   enoxaparin (LOVENOX) injection  40 mg Subcutaneous Q24H   feeding supplement  237 mL Oral TID BM   insulin aspart  0-9 Units Subcutaneous Q6H   lidocaine-prilocaine   Topical Once   metoprolol tartrate  12.5 mg Oral BID   pantoprazole  40 mg Oral QHS    PRN meds: sodium chloride, acetaminophen, benzocaine, magic mouthwash, menthol-cetylpyridinium, morphine injection, ondansetron **OR** ondansetron (ZOFRAN) IV   Antimicrobials: Anti-infectives (From admission, onward)    Start     Dose/Rate Route Frequency Ordered Stop   04/30/21 2200  acyclovir (ZOVIRAX) 200 MG capsule 400 mg        400 mg Oral 2 times daily 04/30/21 1211     04/30/21 0000  Ampicillin-Sulbactam (UNASYN) 3 g in sodium chloride 0.9 % 100 mL IVPB  Status:  Discontinued        3 g 200 mL/hr over 30 Minutes Intravenous Every 6 hours 04/29/21 1609 05/01/21 1216   04/24/21 1244  piperacillin-tazobactam (ZOSYN) 3.375  GM/50ML IVPB       Note to Pharmacy: [place]   : cabinet override      04/24/21 1244 04/25/21 0134   04/17/21 1200  acyclovir (ZOVIRAX) 265 mg in dextrose 5 % 100 mL IVPB  Status:  Discontinued        5 mg/kg  52.5 kg 105.3 mL/hr over 60 Minutes Intravenous Every 12 hours 04/17/21 1008 04/30/21 1211   04/15/21 1400  piperacillin-tazobactam (ZOSYN) IVPB 3.375 g  Status:  Discontinued        3.375 g 12.5 mL/hr over 240 Minutes Intravenous Every 8 hours 04/15/21 1113 04/29/21 1609   04/15/21 1100  acyclovir (ZOVIRAX) 200 MG capsule 400 mg  Status:  Discontinued       Note to Pharmacy: To prevent shingles     400 mg Oral  2 times daily 04/15/21 1051 04/17/21 1008   04/15/21 0700  ceFEPIme (MAXIPIME) 2 g in sodium chloride 0.9 % 100 mL IVPB        2 g 200 mL/hr over 30 Minutes Intravenous  Once 04/15/21 0650 04/15/21 0836   04/15/21 0700  metroNIDAZOLE (FLAGYL) IVPB 500 mg        500 mg 100 mL/hr over 60 Minutes Intravenous  Once 04/15/21 0650 04/15/21 1024       Objective: Vitals:   05/01/21 0829 05/01/21 1507  BP: (!) 127/58 (!) 114/53  Pulse: (!) 116 (!) 118  Resp: 18 20  Temp: 97.8 F (36.6 C) 98.2 F (36.8 C)  SpO2: 99% 98%    Intake/Output Summary (Last 24 hours) at 05/01/2021 1623 Last data filed at 05/01/2021 1426 Gross per 24 hour  Intake 4089.7 ml  Output 250 ml  Net 3839.7 ml   Filed Weights   04/25/21 0550 04/26/21 0500 04/29/21 0542  Weight: 47.2 kg 49.3 kg 46.6 kg   Weight change:  Body mass index is 18.79 kg/m.   Physical Exam: General exam: Pleasant, elderly Caucasian female.  Not in physical distress Skin: No rashes, lesions or ulcers. HEENT: Atraumatic, normocephalic, no obvious bleeding Lungs: Clear to auscultation bilaterally CVS: Regular rate and rhythm, no murmur GI/Abd soft, nontender, nondistended, bowel sound present CNS: Alert, awake, oriented x3 Psychiatry: Mood appropriate Extremities: No pedal edema, no calf tenderness  Data  Review: I have personally reviewed the laboratory data and studies available.  F/u labs ordered Unresulted Labs (From admission, onward)     Start     Ordered   04/22/21 0500  Comprehensive metabolic panel  (TPN Lab Panel)  Every Mon,Thu (0500),   TIMED     Question:  Specimen collection method  Answer:  Lab=Lab collect   04/19/21 1236   04/22/21 0500  Magnesium  (TPN Lab Panel)  Every Mon,Thu (0500),   TIMED     Question:  Specimen collection method  Answer:  Lab=Lab collect   04/19/21 1236   04/22/21 0500  Phosphorus  (TPN Lab Panel)  Every Mon,Thu (0500),   TIMED     Question:  Specimen collection method  Answer:  Lab=Lab collect   04/19/21 1236   04/22/21 0500  Triglycerides  (TPN Lab Panel)  Every Monday (0500),   TIMED     Question:  Specimen collection method  Answer:  Lab=Lab collect   04/19/21 1236   04/17/21 1218  CBC with Differential/Platelet  Daily,   R     Question:  Specimen collection method  Answer:  Lab=Lab collect   04/17/21 1217            Signed, Terrilee Croak, MD [facility] 05/01/2021

## 2021-05-01 NOTE — Progress Notes (Signed)
Physical Therapy Treatment Patient Details Name: Mary Washington MRN: [id number] DOB: 02-11-1946 Today's Date: 05/01/2021   History of Present Illness Pt admitted for acute diverticulitis with pelvic abscess and SBO. Pt is now s/p partial colectomy. 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    PT Comments    Pt is making good progress towards goals with ability to ambulate around RN station. Encouraged to sit in recliner longer this date and keep doing IS. There-ex performed while seated in recliner. HR increased to 122bpm with exertion. Will continue to progress as able.  Recommendations for follow up therapy are one component of a multi-disciplinary discharge planning process, led by the attending physician.  Recommendations may be updated based on patient status, additional functional criteria and insurance authorization.  Follow Up Recommendations  Home health PT     Assistance Recommended at Discharge Set up Supervision/Assistance  Equipment Recommendations  None recommended by PT    Recommendations for Other Services       Precautions / Restrictions Precautions Precautions: Fall Precaution Comments: mod fall Restrictions Weight Bearing Restrictions: No Other Position/Activity Restrictions: abdominal incision, colostomy     Mobility  Bed Mobility Overal bed mobility: Modified Independent Bed Mobility: Supine to Sit;Sit to Supine     Supine to sit: Modified independent (Device/Increase time)     General bed mobility comments: no cues required    Transfers Overall transfer level: Modified independent Equipment used: Rolling walker (2 wheels) Transfers: Sit to/from Stand;Bed to chair/wheelchair/BSC Sit to Stand: Modified independent (Device/Increase time);Supervision           General transfer comment: no cues needed    Ambulation/Gait Ambulation/Gait assistance: Supervision Gait Distance (Feet): 200 Feet Assistive device: Rolling walker (2  wheels) Gait Pattern/deviations: Step-through pattern;Decreased step length - right;Decreased step length - left       General Gait Details: ambulated with slightly increased gait speed. RW used. Endorses fatigue. HR at 122bpm post exertion   Stairs             Wheelchair Mobility    Modified Rankin (Stroke Patients Only)       Balance Overall balance assessment: Needs assistance Sitting-balance support: Feet supported;No upper extremity supported Sitting balance-Leahy Scale: Good     Standing balance support: Bilateral upper extremity supported Standing balance-Leahy Scale: Good                              Cognition Arousal/Alertness: Awake/alert Behavior During Therapy: WFL for tasks assessed/performed Overall Cognitive Status: Within Functional Limits for tasks assessed                                 General Comments: quiet        Exercises Other Exercises Other Exercises: seated ther-ex performed on B LE including ankle circles, SLR, LAQ, hip add squeezes, hip ab/add, mod abd crunches, and IS to 486mL. All ther-ex performed x 10 reps and supervision    General Comments        Pertinent Vitals/Pain Pain Assessment: No/denies pain    Home Living                          Prior Function            PT Goals (current goals can now be found in the care plan section) Acute Rehab PT  Goals Patient Stated Goal: to get stronger PT Goal Formulation: With patient Time For Goal Achievement: 05/10/21 Potential to Achieve Goals: Good Progress towards PT goals: Progressing toward goals    Frequency    Min 2X/week      PT Plan Current plan remains appropriate    Co-evaluation              AM-PAC PT "6 Clicks" Mobility   Outcome Measure  Help needed turning from your back to your side while in a flat bed without using bedrails?: None Help needed moving from lying on your back to sitting on the side of a  flat bed without using bedrails?: None Help needed moving to and from a bed to a chair (including a wheelchair)?: A Little Help needed standing up from a chair using your arms (e.g., wheelchair or bedside chair)?: A Little Help needed to walk in hospital room?: A Little Help needed climbing 3-5 steps with a railing? : A Little 6 Click Score: 20    End of Session Equipment Utilized During Treatment: Gait belt Activity Tolerance: Patient limited by fatigue Patient left: in chair Nurse Communication: Mobility status PT Visit Diagnosis: Muscle weakness (generalized) (M62.81);Difficulty in walking, not elsewhere classified (R26.2)     Time: 1601-0932 PT Time Calculation (min) (ACUTE ONLY): 23 min  Charges:  $Gait Training: 8-22 mins $Therapeutic Exercise: 8-22 mins                     Greggory Stallion, PT, DPT [phone]    Royetta Probus 05/01/2021, 12:08 PM

## 2021-05-01 NOTE — Progress Notes (Signed)
PHARMACIST - PHYSICIAN COMMUNICATION  CONCERNING: IV to Oral Route Change Policy  RECOMMENDATION: This patient is receiving pantoprazole by the intravenous route.  Based on criteria approved by the Pharmacy and Therapeutics Committee, the intravenous medication(s) is/are being converted to the equivalent oral dose form(s).   DESCRIPTION: These criteria include: The patient is eating (either orally or via tube) and/or has been taking other orally administered medications for a least 24 hours The patient has no evidence of active gastrointestinal bleeding or impaired GI absorption (gastrectomy, short bowel, patient on TNA or NPO).  If you have questions about this conversion, please contact the [place], [facility] 05/01/2021 2:05 PM

## 2021-05-01 NOTE — Progress Notes (Addendum)
   Date of Admission:  04/15/2021      ID: Mary Washington is a 75 y.o. 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)   Hyponatremia   Antineoplastic chemotherapy induced pancytopenia (HCC)   SBO (small bowel obstruction) (HCC)    Subjective: Pt doing better Walked with PT Passing stools Drinking full liquids  Medications:   acyclovir  400 mg Oral BID   Chlorhexidine Gluconate Cloth  6 each Topical Daily   enoxaparin (LOVENOX) injection  40 mg Subcutaneous Q24H   feeding supplement  1 Container Oral TID BM   insulin aspart  0-9 Units Subcutaneous Q6H   lidocaine-prilocaine   Topical Once   pantoprazole (PROTONIX) IV  40 mg Intravenous QHS   polyethylene glycol  17 g Oral Daily    Objective: Vital signs in last 24 hours: Temp:  [97.4 F (36.3 C)-98.2 F (36.8 C)] 97.8 F (36.6 C) (11/30 0829) Pulse Rate:  [109-116] 116 (11/30 0829) Resp:  [16-19] 18 (11/30 0829) BP: (127-144)/(57-75) 127/58 (11/30 0829) SpO2:  [97 %-99 %] 99 % (11/30 0829)  PHYSICAL EXAM:  General: Alert, cooperative, no distress, appears stated age. pale Head: Normocephalic, without obvious abnormality, atraumatic. Eyes: Conjunctivae clear, anicteric sclerae. Pupils are equal ENT Nares normal. No drainage or sinus tenderness. Lips, mucosa, and tongue normal. No Thrush Neck: Supple, symmetrical, no adenopathy, thyroid: non tender no carotid bruit and no JVD. Back: No CVA tenderness. Lungs: b/l air entry  Heart: Regular rate and rhythm, no murmur, rub or gallop. Abdomen: Soft, BS present- colostomy working Port in place Extremities: atraumatic, no cyanosis. No edema. No clubbing Skin: No rashes or lesions. Or bruising Lymph: Cervical, supraclavicular normal. Neurologic: Grossly non-focal  Lab Results Recent Labs    04/30/21 0646 05/01/21 0522  WBC 8.7 8.4  HGB 8.6* 8.6*  HCT 26.7* 26.4*  NA 140 132*  K 3.2* 3.5  CL  110 104  CO2 26 25  BUN 18 17  CREATININE 0.57 0.38*   Liver Panel Recent Labs    04/29/21 0608  PROT 5.4*  ALBUMIN 2.1*  AST 27  ALT 22  ALKPHOS 85  BILITOT 0.2*     Assessment/Plan: Perforated sigmoid diverticulitis with pelvic abscesses- s/p lapartomy, resection and colostomy on 04/24/21 Small bowel obstruction due to adherence of loop to pelvic abscess- s/p small segment of small bowel resected Culture positive for e.coli, kleb and bacteroides- susceptible to unasyn- DC zosyn and changed to unasyn on 11/28 As ileus resolved, wbc normalized, tolerating oral fluids will DC antibiotic- Discussed with Dr.Cintron   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

## 2021-05-02 LAB — COMPREHENSIVE METABOLIC PANEL
ALT: 67 U/L — ABNORMAL HIGH (ref 0–44)
AST: 39 U/L (ref 15–41)
Albumin: 2 g/dL — ABNORMAL LOW (ref 3.5–5.0)
Alkaline Phosphatase: 81 U/L (ref 38–126)
Anion gap: 6 (ref 5–15)
BUN: 19 mg/dL (ref 8–23)
CO2: 24 mmol/L (ref 22–32)
Calcium: 8.1 mg/dL — ABNORMAL LOW (ref 8.9–10.3)
Chloride: 100 mmol/L (ref 98–111)
Creatinine, Ser: 0.46 mg/dL (ref 0.44–1.00)
GFR, Estimated: >60 mL/min (ref >60)
Glucose, Bld: 141 mg/dL — ABNORMAL HIGH (ref 70–99)
Potassium: 3.4 mmol/L — ABNORMAL LOW (ref 3.5–5.1)
Sodium: 130 mmol/L — ABNORMAL LOW (ref 135–145)
Total Bilirubin: 0.4 mg/dL (ref 0.3–1.2)
Total Protein: 4.8 g/dL — ABNORMAL LOW (ref 6.5–8.1)

## 2021-05-02 LAB — GLUCOSE, CAPILLARY
Glucose-Capillary: 143 mg/dL — ABNORMAL HIGH (ref 70–99)
Glucose-Capillary: 120 mg/dL — ABNORMAL HIGH (ref 70–99)

## 2021-05-02 LAB — PHOSPHORUS: Phosphorus: 3.4 mg/dL (ref 2.5–4.6)

## 2021-05-02 LAB — CBC WITH DIFFERENTIAL/PLATELET
Abs Immature Granulocytes: 0.36 10*3/uL — ABNORMAL HIGH (ref 0.00–0.07)
Basophils Absolute: 0.1 10*3/uL (ref 0.0–0.1)
Basophils Relative: 1 %
Eosinophils Absolute: 0.2 10*3/uL (ref 0.0–0.5)
Eosinophils Relative: 2 %
HCT: 25.5 % — ABNORMAL LOW (ref 36.0–46.0)
Hemoglobin: 8.3 g/dL — ABNORMAL LOW (ref 12.0–15.0)
Immature Granulocytes: 5 %
Lymphocytes Relative: 8 %
Lymphs Abs: 0.5 10*3/uL — ABNORMAL LOW (ref 0.7–4.0)
MCH: 28.3 pg (ref 26.0–34.0)
MCHC: 32.5 g/dL (ref 30.0–36.0)
MCV: 87 fL (ref 80.0–100.0)
Monocytes Absolute: 0.5 10*3/uL (ref 0.1–1.0)
Monocytes Relative: 7 %
Neutro Abs: 5.5 10*3/uL (ref 1.7–7.7)
Neutrophils Relative %: 77 %
Platelets: 296 10*3/uL (ref 150–400)
RBC: 2.93 MIL/uL — ABNORMAL LOW (ref 3.87–5.11)
RDW: 16.4 % — ABNORMAL HIGH (ref 11.5–15.5)
WBC: 7.1 10*3/uL (ref 4.0–10.5)
nRBC: 0 % (ref 0.0–0.2)

## 2021-05-02 LAB — MAGNESIUM: Magnesium: 1.9 mg/dL (ref 1.7–2.4)

## 2021-05-02 MED ORDER — SODIUM CHLORIDE 0.9 % IV SOLN: INTRAVENOUS | Status: DC

## 2021-05-02 MED ORDER — METOPROLOL TARTRATE 25 MG PO TABS
25.0000 mg | ORAL_TABLET | Freq: Two times a day (BID) | ORAL | Status: DC
Start: 2021-05-02 — End: 2021-05-02

## 2021-05-02 MED ORDER — METOPROLOL TARTRATE 25 MG PO TABS
25.0000 mg | ORAL_TABLET | Freq: Two times a day (BID) | ORAL | Status: DC
  Administered 2021-05-02 – 2021-05-03 (×2): 25 mg via ORAL
  Filled 2021-05-02 (×2): qty 1

## 2021-05-02 MED ORDER — POTASSIUM CHLORIDE CRYS ER 20 MEQ PO TBCR
40.0000 meq | EXTENDED_RELEASE_TABLET | Freq: Once | ORAL | Status: AC
  Administered 2021-05-02: 40 meq via ORAL
  Filled 2021-05-02: qty 2

## 2021-05-02 NOTE — Progress Notes (Addendum)
PHARMACY - TOTAL PARENTERAL NUTRITION CONSULT NOTE   Indication: Prolonged ileus  Patient Measurements: Height: 5\' 2"  (157.5 cm) Weight: 46.6 kg (102 lb 11.8 oz) IBW/kg (Calculated) : 50.1 TPN AdjBW (KG): 51.7 Body mass index is 18.79 kg/m.  Assessment: 75 y.o. 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  Glucose / Insulin: BG/24h 137 - 149 (4 units SSI required) Electrolytes: Mild hyponatremia, mild hypoklaemia Renal: Scr < 1, stable Hepatic: LFTs wnl, TG wnl  Intake / Output; MIVF: NS at 75 cc/hr GI Imaging: 11/17 CT Abd: Sigmoid diverticulitis GI Surgeries / Procedures:  11/23: Partial colectomy with end colostomy creation and small bowel resection with anastomosis  Central access: 11/23/20 TPN start date: 04/19/21  Nutritional Goals: Goal TPN rate is 75 mL/hr (provides 93 g of protein and 1788 kcals per day)  RD Assessment: Estimated Needs Total Energy Estimated Needs: 1700-2000kcal/day Total Protein Estimated Needs: 85-100g/day Total Fluid Estimated Needs: 1.3-1.5L/day  Current Nutrition: Soft diet  Plan:  Continue TPN until bag runs out today at 1800. No further TPN after that Nutritional Components: Amino acids (using 10% Travasol): 93.6 grams  Dextrose: 270 grams Lipids (using 20% SMOFlipid): 49.6 grams Total kCal: 1788 / 24h Electrolytes in TPN: Na 50 mEq/L, K 50 mEq/L, Ca 5 mEq/L, Mg 5 mEq/L, and Phos 10 mmol/L. Cl:Ac 1:1 Add standard MVI and trace elements to TPN Continue Sensitive q6h SSI and adjust as needed. Can probably stop this as no apparent history of diabetes and BG overall well controlled < 180 Monitor TPN labs on Mon/Thurs, daily until stable  Benita Gutter 05/02/2021 10:23 AM

## 2021-05-02 NOTE — TOC Progression Note (Addendum)
Transition of Care [facility]) - Progression Note    Patient Details  Name: Mary Washington MRN: [id number] Date of Birth: Feb 11, 1946  Transition of Care [facility]) CM/SW [place], LCSW Phone Number: 05/02/2021, 9:52 AM  Clinical Narrative:  Mary Washington is able to accept referral for home health PT, OT, and RN. Ordered 3-in-1 through [employer].   12:53 pm: Updated patient.  Expected Discharge Plan: [place] Barriers to Discharge: Continued Medical Work up  Expected Discharge Plan and Services Expected Discharge Plan: [place] Choice: Durable Medical Equipment, Home Health Living arrangements for the past 2 months: Single Family Home                                       Social Determinants of Health (SDOH) Interventions    Readmission Risk Interventions Readmission Risk Prevention Plan 05/01/2021  PCP or Specialist Appt within 3-5 Days Complete  Social Work Consult for [place] Planning/Counseling Complete  Palliative Care Screening Not Applicable  Some recent data might be hidden

## 2021-05-02 NOTE — Consult Note (Addendum)
[place] Nurse ostomy consult note Stoma type/location: LLQ end colostomy  Stomal assessment/size: 1 3/4" round, budded, above skin level, Os at center Peristomal assessment: intact  Output ; small amt brown liquid stool Ostomy pouching: 2pc. 2 3/4"  Education provided : Husband at the bedside; was able to open and close to empty.  Pt assisted with pouch application, was able to open and close to empty, and snap together 2 piece pouching system. Reviewed pouching routines and ordering supplies.  Educational materials and 4 sets of barrier rings and pouches left at bedside. Pt plans to have home health assistance after discharge. Demonstrated pouch change using 2 piece pouching system; wafer Kellie Simmering # 2, pouch Lawson # [id number].   Enrolled patient in [place] program: Yes previously [place] Nurse will follow along with you for continued support with ostomy teaching and care Thank-you,  Julien Girt MSN, [place], [place], [place], [place]

## 2021-05-02 NOTE — Progress Notes (Signed)
PROGRESS NOTE  Mary Washington  DOB: 08/10/1945  PCP: [name], MD EQA:[id number]  DOA: 04/15/2021  LOS: 92 days  Hospital Day: 18  Chief Complaint  Patient presents with   Bloated   Abdominal Pain    Brief narrative: Jenalee Trevizo is a 75 y.o. 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 Patient presented to the ED on 11/14 for evaluation of abdominal distention, bloating.  Patient is on chemotherapy every 3 weeks and her last treatment was 1 week prior on 11/07.  Following that chemo, patient started getting constipation and had to take MiraLAX, lactulose without any effect and hence presented to the ED. In the ED, she had a sodium level low at 123 CT scan of abdomen and pelvis showed acute sigmoid diverticulitis. Two small interloop fluid collections were identified within the pelvis concerning for perforation with early abscess formation. Signs of bowel obstruction and/or ileus identified with transition to decreased caliber distal small bowel loops within the right lower quadrant of the abdomen. She was admitted to hospitalist service General surgery consultation was obtained. Patient underwent sigmoid colon resection w/ colostomy, small bowel resection w/ anastomosis, excision of mesenteric mass & draining of multiple abscess. Mesenteric mass was sent to pathology & pending. Intra-abd abscess culture grew E. coli.  Patient was continued on IV Unasyn per ID recommendation.  TPN was started. Postop ileus seems to be improving now.  Subjective: Patient was seen and examined this morning.   Pleasant elderly Caucasian female.  Sitting up at the edge of the bed.  Not in distress.  Later on this morning, she was ambulating on the hallway with PT.  Her heart rate stayed under 120 on ambulation.  She did not require supplemental oxygen.    Assessment/Plan: Acute diverticulitis: w/ pelvic abscess & SBO -S/p  sigmoid colon resection w/ colostomy, small bowel resection w/ anastomosis, excision of mesenteric mass & draining of multiple abscess. -Mesenteric mass sent to pathology & pending.  -Intra-abd abscess cx growing e. coli.  -Completed a course of IV Unasyn. -Postop ileus improving.  NG tube removed.  Tolerated full liquid diet.  Advanced to soft diet today.  Being weaned off TPN.  General surgery following. -No fever.  WBC count improving. Recent Labs  Lab 04/28/21 0434 04/29/21 0608 04/30/21 0646 05/01/21 0522 05/02/21 0515  WBC 12.8* 11.4* 8.7 8.4 7.1    Large B-cell lymphoma on chemotherapy  Chronic anemia -Hemoglobin stable between 8 and 9. -Last chemotherapy on 11/7. -Follow-up with oncology as an outpatient. Recent Labs  Lab 04/28/21 0434 04/29/21 0608 04/30/21 0646 05/01/21 0522 05/02/21 0515  WBC 12.8* 11.4* 8.7 8.4 7.1  NEUTROABS 10.8* 9.8* 7.3 6.8 5.5  HGB 9.4* 8.9* 8.6* 8.6* 8.3*  HCT 30.2* 28.0* 26.7* 26.4* 25.5*  MCV 92.1 90.3 90.8 88.0 87.0  PLT 469* 395 316 285 296    Hyponatremia -Mild, expected to improve with improvement in nutrition.  Also normal saline for next 24 hours Recent Labs  Lab 04/29/21 0608 04/30/21 0646 05/01/21 0522 05/02/21 0515  NA 147* 140 132* 130*    Hypokalemia -Potassium level low at 3.4 today.  Oral replacement given Recent Labs  Lab 04/29/21 0608 04/29/21 1818 04/30/21 0646 05/01/21 0522 05/02/21 0515  K 2.6* 3.0* 3.2* 3.5 3.4*  MG 2.0  --   --   --  1.9  PHOS 3.2  --   --   --  3.4  Generalized weakness -PT/OT eval appreciated. -Encourage out of bed and ambulation.  Persistent sinus tachycardia -Her heart rate seems to be consistently elevated to over 110.  Not any aggressive pain.  May have mild dehydration.  No history of DVT/PE.  She has remained on Lovenox subcu for DVT prophylaxis.  Her heart rate did not worsen on ambulation.  She is currently on metoprolol 12.5 mg twice daily.  I will increase it to 25  mg twice daily.  Mobility: Encourage ambulation Living condition: Was living at home Goals of care:   Code Status: Full Code  Nutritional status: Body mass index is 18.79 kg/m. Nutrition Problem: Inadequate oral intake Etiology: acute illness Signs/Symptoms: NPO status Diet:  Diet Order             DIET SOFT Room service appropriate? Yes; Fluid consistency: Thin  Diet effective now                  DVT prophylaxis:  enoxaparin (LOVENOX) injection 40 mg Start: 04/16/21 1800 SCDs Start: 04/15/21 1048   Antimicrobials: Acyclovir twice daily as part of prophylaxis for chemotherapy patient  fluid: IV fluid through TPN Consultants: General surgery Family Communication: None at bedside  Status is: Inpatient  Remains inpatient appropriate because: Improving postop ileus  Dispo: The patient is from: Home              Anticipated d/c is to: Home with home health in 1 to 2 days              Patient currently is not medically stable to d/c.   Difficult to place patient No     Infusions:   sodium chloride Stopped (04/26/21 2323)   sodium chloride Stopped (05/02/21 1210)   TPN ADULT (ION) 75 mL/hr at 05/01/21 1826    Scheduled Meds:  acyclovir  400 mg Oral BID   Chlorhexidine Gluconate Cloth  6 each Topical Daily   enoxaparin (LOVENOX) injection  40 mg Subcutaneous Q24H   feeding supplement  237 mL Oral TID BM   metoprolol tartrate  12.5 mg Oral BID   pantoprazole  40 mg Oral QHS   sodium chloride flush  10-40 mL Intracatheter Q12H    PRN meds: sodium chloride, acetaminophen, benzocaine, magic mouthwash, menthol-cetylpyridinium, morphine injection, ondansetron **OR** ondansetron (ZOFRAN) IV, sodium chloride flush   Antimicrobials: Anti-infectives (From admission, onward)    Start     Dose/Rate Route Frequency Ordered Stop   04/30/21 2200  acyclovir (ZOVIRAX) 200 MG capsule 400 mg        400 mg Oral 2 times daily 04/30/21 1211     04/30/21 0000   Ampicillin-Sulbactam (UNASYN) 3 g in sodium chloride 0.9 % 100 mL IVPB  Status:  Discontinued        3 g 200 mL/hr over 30 Minutes Intravenous Every 6 hours 04/29/21 1609 05/01/21 1216   04/24/21 1244  piperacillin-tazobactam (ZOSYN) 3.375 GM/50ML IVPB       Note to Pharmacy: [facility]   : cabinet override      04/24/21 1244 04/25/21 0134   04/17/21 1200  acyclovir (ZOVIRAX) 265 mg in dextrose 5 % 100 mL IVPB  Status:  Discontinued        5 mg/kg  52.5 kg 105.3 mL/hr over 60 Minutes Intravenous Every 12 hours 04/17/21 1008 04/30/21 1211   04/15/21 1400  piperacillin-tazobactam (ZOSYN) IVPB 3.375 g  Status:  Discontinued        3.375 g 12.5 mL/hr over  240 Minutes Intravenous Every 8 hours 04/15/21 1113 04/29/21 1609   04/15/21 1100  acyclovir (ZOVIRAX) 200 MG capsule 400 mg  Status:  Discontinued       Note to Pharmacy: To prevent shingles     400 mg Oral 2 times daily 04/15/21 1051 04/17/21 1008   04/15/21 0700  ceFEPIme (MAXIPIME) 2 g in sodium chloride 0.9 % 100 mL IVPB        2 g 200 mL/hr over 30 Minutes Intravenous  Once 04/15/21 0650 04/15/21 0836   04/15/21 0700  metroNIDAZOLE (FLAGYL) IVPB 500 mg        500 mg 100 mL/hr over 60 Minutes Intravenous  Once 04/15/21 0650 04/15/21 1024       Objective: Vitals:   05/02/21 0446 05/02/21 0832  BP: 125/62 128/61  Pulse: (!) 115 (!) 105  Resp: 18 16  Temp: 98.4 F (36.9 C) 98.2 F (36.8 C)  SpO2: 98% 98%    Intake/Output Summary (Last 24 hours) at 05/02/2021 1243 Last data filed at 05/02/2021 1200 Gross per 24 hour  Intake 3185.25 ml  Output 400 ml  Net 2785.25 ml    Filed Weights   04/25/21 0550 04/26/21 0500 04/29/21 0542  Weight: 47.2 kg 49.3 kg 46.6 kg   Weight change:  Body mass index is 18.79 kg/m.   Physical Exam: General exam: Pleasant, elderly Caucasian female.  Not in physical distress Skin: No rashes, lesions or ulcers. HEENT: Atraumatic, normocephalic, no obvious bleeding Lungs: Clear to  auscultation bilaterally CVS: tachycardia, regular rhythm, no murmur GI/Abd soft, nontender, nondistended, bowel sound present CNS: Alert, awake, oriented x3 Psychiatry: Mood appropriate Extremities: No pedal edema, no calf tenderness  Data Review: I have personally reviewed the laboratory data and studies available.  F/u labs ordered Unresulted Labs (From admission, onward)     Start     Ordered   04/17/21 1218  CBC with Differential/Platelet  Daily,   R     Question:  Specimen collection method  Answer:  Lab=Lab collect   04/17/21 1217            Signed, Terrilee Croak, MD [facility] 05/02/2021

## 2021-05-02 NOTE — Progress Notes (Signed)
Physical Therapy Treatment Patient Details Name: Mary Washington MRN: [id number] DOB: 12-Jun-1945 Today's Date: 05/02/2021   History of Present Illness Pt admitted for acute diverticulitis with pelvic abscess and SBO. Pt is now s/p partial colectomy. 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    PT Comments    Pt is making good progress towards goals with improved tolerance for mobility efforts this date. Able to ambulate lap in hallway and progress to standing there-ex. Pt hopeful for discharge tomorrow. Will continue to progress.  Recommendations for follow up therapy are one component of a multi-disciplinary discharge planning process, led by the attending physician.  Recommendations may be updated based on patient status, additional functional criteria and insurance authorization.  Follow Up Recommendations  Home health PT     Assistance Recommended at Discharge Set up Supervision/Assistance  Equipment Recommendations  None recommended by PT    Recommendations for Other Services       Precautions / Restrictions Precautions Precautions: Fall Precaution Comments: mod fall Restrictions Weight Bearing Restrictions: No Other Position/Activity Restrictions: colostomy     Mobility  Bed Mobility Overal bed mobility: Modified Independent Bed Mobility: Supine to Sit;Sit to Supine     Supine to sit: Modified independent (Device/Increase time)     General bed mobility comments: no cues required    Transfers Overall transfer level: Modified independent Equipment used: Rolling walker (2 wheels) Transfers: Sit to/from Stand;Bed to chair/wheelchair/BSC Sit to Stand: Modified independent (Device/Increase time);Supervision           General transfer comment: no cues needed    Ambulation/Gait Ambulation/Gait assistance: Modified independent (Device/Increase time) Gait Distance (Feet): 200 Feet Assistive device: Rolling walker (2 wheels) Gait Pattern/deviations:  Step-through pattern;Decreased step length - right;Decreased step length - left       General Gait Details: ambulated around RN unit with less fatigue this date. Reports she already ambulated lap earlier with staff. Reciprocal gait pattern   Stairs             Wheelchair Mobility    Modified Rankin (Stroke Patients Only)       Balance Overall balance assessment: Needs assistance Sitting-balance support: Feet supported;No upper extremity supported Sitting balance-Leahy Scale: Good     Standing balance support: Bilateral upper extremity supported Standing balance-Leahy Scale: Good                              Cognition Arousal/Alertness: Awake/alert Behavior During Therapy: WFL for tasks assessed/performed Overall Cognitive Status: Within Functional Limits for tasks assessed                                 General Comments: quiet        Exercises Other Exercises Other Exercises: standing ther-ex performed on B LE including marching and hip abd/add. 10 reps performed with supervision    General Comments        Pertinent Vitals/Pain Pain Assessment: No/denies pain    Home Living                          Prior Function            PT Goals (current goals can now be found in the care plan section) Acute Rehab PT Goals Patient Stated Goal: to get stronger PT Goal Formulation: With patient Time For Goal Achievement: 05/10/21 Potential  to Achieve Goals: Good Progress towards PT goals: Progressing toward goals    Frequency    Min 2X/week      PT Plan Current plan remains appropriate    Co-evaluation              AM-PAC PT "6 Clicks" Mobility   Outcome Measure  Help needed turning from your back to your side while in a flat bed without using bedrails?: None Help needed moving from lying on your back to sitting on the side of a flat bed without using bedrails?: None Help needed moving to and from a bed  to a chair (including a wheelchair)?: A Little Help needed standing up from a chair using your arms (e.g., wheelchair or bedside chair)?: A Little Help needed to walk in hospital room?: A Little Help needed climbing 3-5 steps with a railing? : A Little 6 Click Score: 20    End of Session Equipment Utilized During Treatment: Gait belt Activity Tolerance: Patient limited by fatigue Patient left: in chair Nurse Communication: Mobility status PT Visit Diagnosis: Muscle weakness (generalized) (M62.81);Difficulty in walking, not elsewhere classified (R26.2)     Time: 7290-2111 PT Time Calculation (min) (ACUTE ONLY): 26 min  Charges:  $Gait Training: 8-22 mins $Therapeutic Exercise: 8-22 mins                     Mary Washington, PT, DPT [phone]    Mary Washington 05/02/2021, 1:40 PM

## 2021-05-02 NOTE — Progress Notes (Signed)
Patient ID: Mary Washington, female   DOB: 1945/10/19, 75 y.o.   MRN: [id number]     [facility] Hospital Day(s): 17.   Interval History: Patient seen and examined, no acute events or new complaints overnight. Patient reports feeling well.  He denies abdominal pain.  She denies any chest pain or shortness of breath.  Patient endorses she tolerated full liquids.  No nausea or vomiting.  Continued having stool through colostomy.  Vital signs in last 24 hours: [min-max] current  Temp:  [97.8 F (36.6 C)-98.5 F (36.9 C)] 98.4 F (36.9 C) (12/01 0446) Pulse Rate:  [115-118] 115 (12/01 0446) Resp:  [18-20] 18 (12/01 0446) BP: (107-127)/(53-62) 125/62 (12/01 0446) SpO2:  [98 %-100 %] 98 % (12/01 0446)     Height: 5\' 2"  (157.5 cm) Weight: 46.6 kg BMI (Calculated): 18.79   Physical Exam:  Constitutional: alert, cooperative and no distress  Respiratory: breathing non-labored at rest  Cardiovascular: Tachycardic Gastrointestinal: soft, non-tender, and non-distended.  The wounds remain clean.  Colostomy pink and patent.  Labs:  CBC Latest Ref Rng & Units 05/02/2021 05/01/2021 04/30/2021  WBC 4.0 - 10.5 K/uL 7.1 8.4 8.7  Hemoglobin 12.0 - 15.0 g/dL 8.3(L) 8.6(L) 8.6(L)  Hematocrit 36.0 - 46.0 % 25.5(L) 26.4(L) 26.7(L)  Platelets 150 - 400 K/uL 296 285 316   CMP Latest Ref Rng & Units 05/02/2021 05/01/2021 04/30/2021  Glucose 70 - 99 mg/dL 141(H) 147(H) 142(H)  BUN 8 - 23 mg/dL 19 17 18   Creatinine 0.44 - 1.00 mg/dL 0.46 0.38(L) 0.57  Sodium 135 - 145 mmol/L 130(L) 132(L) 140  Potassium 3.5 - 5.1 mmol/L 3.4(L) 3.5 3.2(L)  Chloride 98 - 111 mmol/L 100 104 110  CO2 22 - 32 mmol/L 24 25 26   Calcium 8.9 - 10.3 mg/dL 8.1(L) 8.2(L) 8.1(L)  Total Protein 6.5 - 8.1 g/dL 4.8(L) - -  Total Bilirubin 0.3 - 1.2 mg/dL 0.4 - -  Alkaline Phos 38 - 126 U/L 81 - -  AST 15 - 41 U/L 39 - -  ALT 0 - 44 U/L 67(H) - -    Imaging studies: No new pertinent imaging studies   Assessment/Plan:  75  y.o. female with perforated diverticulitis with pelvic abscess and small bowel obstruction 8 Day Post-Op s/p partial colectomy with end colostomy creation and small bowel resection with anastomosis, complicated by pertinent comorbidities including diet and B-cell lymphoma on chemotherapy.  Patient continues to recover slowly but adequately.  She tolerated full liquids.  Colostomy with adequate output.  No nausea or vomiting.  Patient without NGT.  Will wean off TPN today.  I advance her diet to soft diet.  We will assess for toleration.  No further need of IV antibiotic therapy.  I encouraged the patient to ambulate.  I discussed case with hospitalist to start discharge planning.  If no further complication it would be reasonable to discharge patient within 24 to 48 hours.  I will continue to follow closely.  [name], MD

## 2021-05-02 NOTE — Progress Notes (Signed)
Mobility Specialist - Progress Note   05/02/21 1000  Mobility  Activity Ambulated in hall  Level of Assistance Standby assist, set-up cues, supervision of patient - no hands on  Assistive Device Front wheel walker  Distance Ambulated (ft) 180 ft  Mobility Ambulated with assistance in hallway  Mobility Response Tolerated well  Mobility performed by Mobility specialist  $Mobility charge 1 Mobility    Pre-mobility: 109 HR, 97% SpO2 During mobility: 114 HR, 96% SpO2 Post-mobility: 110 HR, 97% SpO2   Pt ambulated in hallway with supervision. Extra time needed to exit bed. HR in 110s throughout ambulation, O2 maintained > 95% on RA. Mild SOB and windedness during activity. Pt returned to bed with needs in reach, family at bedside. RN notified.   Kathee Delton Mobility Specialist 05/02/21, 10:29 AM

## 2021-05-03 ENCOUNTER — Telehealth: Payer: Self-pay | Admitting: Internal Medicine

## 2021-05-03 DIAGNOSIS — C8581 Other specified types of non-Hodgkin lymphoma, lymph nodes of head, face, and neck: Secondary | ICD-10-CM

## 2021-05-03 LAB — CBC WITH DIFFERENTIAL/PLATELET
Abs Immature Granulocytes: 0.41 10*3/uL — ABNORMAL HIGH (ref 0.00–0.07)
Basophils Absolute: 0.1 10*3/uL (ref 0.0–0.1)
Basophils Relative: 1 %
Eosinophils Absolute: 0.2 10*3/uL (ref 0.0–0.5)
Eosinophils Relative: 2 %
HCT: 26.5 % — ABNORMAL LOW (ref 36.0–46.0)
Hemoglobin: 8.7 g/dL — ABNORMAL LOW (ref 12.0–15.0)
Immature Granulocytes: 5 %
Lymphocytes Relative: 9 %
Lymphs Abs: 0.7 10*3/uL (ref 0.7–4.0)
MCH: 28.7 pg (ref 26.0–34.0)
MCHC: 32.8 g/dL (ref 30.0–36.0)
MCV: 87.5 fL (ref 80.0–100.0)
Monocytes Absolute: 0.7 10*3/uL (ref 0.1–1.0)
Monocytes Relative: 8 %
Neutro Abs: 6.4 10*3/uL (ref 1.7–7.7)
Neutrophils Relative %: 75 %
Platelets: 339 10*3/uL (ref 150–400)
RBC: 3.03 MIL/uL — ABNORMAL LOW (ref 3.87–5.11)
RDW: 16.4 % — ABNORMAL HIGH (ref 11.5–15.5)
WBC: 8.5 10*3/uL (ref 4.0–10.5)
nRBC: 0 % (ref 0.0–0.2)

## 2021-05-03 MED ORDER — POLYETHYLENE GLYCOL 3350 17 G PO PACK
17.0000 g | PACK | Freq: Every day | ORAL | Status: DC
  Administered 2021-05-03: 17 g via ORAL
  Filled 2021-05-03: qty 1

## 2021-05-03 MED ORDER — POTASSIUM CHLORIDE CRYS ER 20 MEQ PO TBCR
40.0000 meq | EXTENDED_RELEASE_TABLET | Freq: Once | ORAL | Status: AC
  Administered 2021-05-03: 40 meq via ORAL
  Filled 2021-05-03: qty 2

## 2021-05-03 MED ORDER — METOPROLOL TARTRATE 25 MG PO TABS: 25.0000 mg | ORAL_TABLET | Freq: Two times a day (BID) | ORAL | 0 refills | Status: DC

## 2021-05-03 MED ORDER — HEPARIN SOD (PORK) LOCK FLUSH 10 UNIT/ML IV SOLN
0.5000 mL | INTRAVENOUS | Status: DC | PRN
Start: 2021-05-03 — End: 2021-05-03
  Administered 2021-05-03: 1.7 mL via INTRAVENOUS
  Filled 2021-05-03 (×3): qty 2

## 2021-05-03 NOTE — Telephone Encounter (Signed)
On 12/2-spoke to patient/husband-tentatively is discharge plan soon.  Recommend evaluation with NP-with regard to electrolyte abnormalities recent hospitalization.  Follow-up with me in 2 months with restaging PET scan.  Please schedule the below-   Follow-up in 2 weeks- NP-labs/cbc-bmp; possible potassium IV 20 mEq.   Follow-up in 2 months-MD labs CBC CMP LDH; PET prior- Dr.B

## 2021-05-03 NOTE — Progress Notes (Signed)
Reviewed discharge instructions with patient and patients wife. Both understood the instructions and their questions were answered. Patient was provided with a few days worth of colostomy supplies.

## 2021-05-03 NOTE — Discharge Instructions (Signed)
Diet: Resume home heart healthy regular diet.  ° °Activity: No heavy lifting >20 pounds (children, pets, laundry, garbage) or strenuous activity until follow-up, but light activity and walking are encouraged. Do not drive or drink alcohol if taking narcotic pain medications. ° °Wound care: May shower with soapy water and pat dry (do not rub incisions), but no baths or submerging incision underwater until follow-up. (no swimming)  ° °Medications: Resume all home medications. For mild to moderate pain: acetaminophen (Tylenol) or ibuprofen (if no kidney disease). Combining Tylenol with alcohol can substantially increase your risk of causing liver disease.  ° °Call office ([phone]) at any time if any questions, worsening pain, fevers/chills, bleeding, drainage from incision site, or other concerns. ° °

## 2021-05-03 NOTE — Progress Notes (Signed)
Patient ID: Mary Washington, female   DOB: [birth year], 75 y.o.   MRN: [id number]     [facility] Hospital Day(s): 18.   Interval History: Patient seen and examined, no acute events or new complaints overnight. Patient reports feeling well.  She denies any new issues.  She denies any pain.  She endorses that she tolerated soft diet without any problem.  She continue passing stool through the colostomy.  Vital signs in last 24 hours: [min-max] current  Temp:  [97.8 F (36.6 C)-98.6 F (37 C)] 97.8 F (36.6 C) (12/02 0304) Pulse Rate:  [105-110] 107 (12/02 0304) Resp:  [16-20] 16 (12/02 0304) BP: (117-132)/(51-61) 132/56 (12/02 0304) SpO2:  [97 %-100 %] 97 % (12/02 0304)     Height: 5\' 2"  (157.5 cm) Weight: 46.6 kg BMI (Calculated): 18.79   Physical Exam:  Constitutional: alert, cooperative and no distress  Respiratory: breathing non-labored at rest  Cardiovascular: regular rate and sinus rhythm  Gastrointestinal: soft, non-tender, and non-distended.  The wound is dry and clean.  Colostomy pink and patent.  Labs:  CBC Latest Ref Rng & Units 05/03/2021 05/02/2021 05/01/2021  WBC 4.0 - 10.5 K/uL 8.5 7.1 8.4  Hemoglobin 12.0 - 15.0 g/dL 8.7(L) 8.3(L) 8.6(L)  Hematocrit 36.0 - 46.0 % 26.5(L) 25.5(L) 26.4(L)  Platelets 150 - 400 K/uL 339 296 285   CMP Latest Ref Rng & Units 05/02/2021 05/01/2021 04/30/2021  Glucose 70 - 99 mg/dL 141(H) 147(H) 142(H)  BUN 8 - 23 mg/dL 19 17 18   Creatinine 0.44 - 1.00 mg/dL 0.46 0.38(L) 0.57  Sodium 135 - 145 mmol/L 130(L) 132(L) 140  Potassium 3.5 - 5.1 mmol/L 3.4(L) 3.5 3.2(L)  Chloride 98 - 111 mmol/L 100 104 110  CO2 22 - 32 mmol/L 24 25 26   Calcium 8.9 - 10.3 mg/dL 8.1(L) 8.2(L) 8.1(L)  Total Protein 6.5 - 8.1 g/dL 4.8(L) - -  Total Bilirubin 0.3 - 1.2 mg/dL 0.4 - -  Alkaline Phos 38 - 126 U/L 81 - -  AST 15 - 41 U/L 39 - -  ALT 0 - 44 U/L 67(H) - -    Imaging studies: No new pertinent imaging studies   Assessment/Plan:  75 y.o.  female with perforated diverticulitis with pelvic abscess and small bowel obstruction 9 Day Post-Op s/p partial colectomy with end colostomy creation and small bowel resection with anastomosis, complicated by pertinent comorbidities including diet and B-cell lymphoma on chemotherapy.  Patient evaluated today.  She seems to be doing good.  Clinically the ileus has resolved.  She is tolerating soft diet.  The colostomy is working adequately.  The wound is dry and clean.  There is no sign of infection.  Heart rate today is 107 much lower than he has been doing the whole week.  No shortness of breath.  From the surgical standpoint if patient has nursing and physical therapy coordinator at home she can continue postop care at home with home health.  I will see her next week in my office for staple removal.  No need for further antibiotic therapy.   Patient has been instructed on how to take care of the colostomy and she feels comfortable.  Is medically stable, patient can be discharged from surgical standpoint.  [name], MD

## 2021-05-03 NOTE — Discharge Summary (Signed)
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 Patient presented to the ED on 11/14 for evaluation of abdominal distention, bloating.  Patient is on chemotherapy every 3 weeks and her last treatment was 1 week prior on 11/07.  Following that chemo, patient started getting constipation and had to take MiraLAX, lactulose without any effect and hence presented to the ED. In the ED, she had a sodium level low at 123 CT scan of abdomen and pelvis showed acute sigmoid diverticulitis. Two small interloop fluid collections were identified within the pelvis concerning for perforation with early abscess formation. Signs of bowel obstruction and/or ileus identified with transition to decreased caliber distal small bowel loops within the right lower quadrant of the abdomen. She was admitted to hospitalist service General surgery consultation was obtained. Patient underwent sigmoid colon resection w/ colostomy, small bowel resection w/ anastomosis, excision of mesenteric mass & draining of multiple abscess. Mesenteric mass was sent to pathology & pending. Intra-abd abscess culture grew E. coli.  Patient was continued on IV Unasyn per ID recommendation.  TPN was started. Postop ileus seems to be improving  now.  Subjective: Patient was seen and examined this morning.   Sitting up in bed.  Not in distress.  Not on oxygen.  Off TPN.  Passing gas and some stool in the ostomy bag.  Heart rate improving.  Cleared from surgical standpoint for discharge today.  Hospital course Acute diverticulitis: w/ pelvic abscess & SBO -S/p sigmoid colon resection w/ colostomy, small bowel resection w/ anastomosis, excision of mesenteric mass & draining of multiple abscess.  -Mesenteric mass reported as hyalinized nodule with degenerative changes and focal calcifications without evidence of malignancy. -Intra-abd abscess cx growing E. coli.  -Completed a course of IV Unasyn. -Postop ileus improving.  NG tube removed.  Tolerated soft diet.  Taken off TPN.  Okay to discharge home per general surgery. -Follow-up with surgery as an outpatient for staples removal.  Sinus tachycardia -Her heart rate was noted to be elevated mostly over 100 here in the hospital.  She states to me that even prior to hospitalization, she was noted to have tachycardia while at the chemotherapy infusion.   -Potential causes of tachycardia were reviewed.  Not hypoxic.  She was kept on DVT prophylaxis.  No suspicion of PE at this time.  No history of CHF or any arrhythmia in the past.  -Tachycardia is currently improving on metoprolol 25 mg twice daily.  Continue same post discharge.  Large B-cell lymphoma on chemotherapy  Chronic anemia -Hemoglobin stable between 8 and 9. -Last chemotherapy on 11/7. -Follow-up with oncology as an outpatient. Recent Labs  Lab 04/29/21 0608 04/30/21 0646 05/01/21 0522 05/02/21 0515 05/03/21 0537  WBC 11.4* 8.7 8.4 7.1 8.5  NEUTROABS 9.8* 7.3 6.8 5.5 6.4  HGB 8.9* 8.6* 8.6* 8.3* 8.7*  HCT 28.0* 26.7* 26.4* 25.5* 26.5*  MCV 90.3 90.8 88.0 87.0 87.5  PLT 395 316 285 296 339   Hyponatremia -Mild, expected to improve with improvement in nutrition.  Given adequate hydration with normal  saline.  Generalized weakness -PT/OT eval appreciated. -Encourage out of bed and ambulation.   Allergies as of 05/03/2021   No Known Allergies      Medication List     STOP taking these medications    allopurinol 300 MG tablet Commonly known as: ZYLOPRIM   benzonatate 100 MG capsule Commonly known as: Tessalon Perles   lactulose 10 GM/15ML solution Commonly known as: CHRONULAC   ondansetron 8 MG tablet Commonly known as: Zofran   predniSONE 50 MG tablet Commonly known as: DELTASONE   prochlorperazine 10 MG tablet Commonly known as: COMPAZINE   scopolamine 1 MG/3DAYS Commonly known as: TRANSDERM-SCOP       TAKE these medications    acetaminophen 500 MG tablet Commonly known as: TYLENOL Take 500 mg by mouth every 6 (six) hours as needed.   acyclovir 400 MG tablet Commonly known as: ZOVIRAX Take 1 tablet (400 mg total) by mouth 2 (two) times daily. To prevent shingles   lidocaine-prilocaine cream Commonly known as: EMLA lidocaine-prilocaine 2.5 %-2.5 % topical cream  APPLY TOPICALLY TO THE AFFECTED AREA 1 TIME   MAG-OXIDE PO Take 1 capsule by mouth daily at 12 noon.   meloxicam 15 MG tablet Commonly known as: MOBIC Take 1 tablet by mouth daily.   metoprolol tartrate 25 MG tablet Commonly known as: LOPRESSOR Take 1 tablet (25 mg total) by mouth 2 (two) times daily.   montelukast 10 MG tablet Commonly known as: SINGULAIR TAKE 1 TABLET(10 MG) BY MOUTH AT BEDTIME. START 2 DAYS BEFORE INFUSION. TAKE IT FOR 4 DAYS   omeprazole 20 MG capsule Commonly known as: PRILOSEC Take 20 mg by mouth 2 (two) times daily.   potassium chloride SA 20 MEQ tablet Commonly known as: KLOR-CON M TAKE 1 TABLET BY MOUTH TWICE DAILY   traMADol 50 MG tablet Commonly known as: ULTRAM Take 1 tablet by mouth every 6 (six) hours as needed. What changed: Another medication with the same name was removed. Continue taking this medication, and follow the directions you see  here.   Vitamin D 50 MCG (2000 UT) Caps Take 1 capsule by mouth daily.   WOMENS MULTIVITAMIN PO Take by mouth.               Durable Medical Equipment  (From admission, onward)           Start     Ordered   05/02/21 0950  For home use only DME 3 n 1  Once        05/02/21 0949              Discharge Care Instructions  (From admission, onward)           Start     Ordered   05/03/21 0000  Discharge wound care:       Comments: Per recommendation by general surgery.   05/03/21 0953            Discharge Instructions:  Diet Recommendation:  Discharge Diet Orders (From admission, onward)     Start     Ordered   05/03/21 0000  Diet general       Comments: Diet Orders (From admission, onward)    Start  Ordered   05/02/21 0742  DIET SOFT Room service appropriate? Yes; Fluid  consistency: Thin  Diet effective now       Question Answer Comment  Room service appropriate? Yes   Fluid consistency: Thin     05/02/21 0741       05/03/21 0953              @BRDDSCINSTRUCTIONS @  Follow ups:    Follow-up Information     [facility], [facility] Follow up.   Specialty: Home Health Services Why: They will follow up with you for your home health needs: Physical therapy, occupational therapy, and nursing. Contact information: [address] [place] [address] [phone]         Herbert Pun, MD Follow up.   Specialty: General Surgery Why: Follow up next Tuesday or Thursday for post op visit after partial colectomy with colostomy. For suture removal, For wound re-check Contact information: [place] [address] [phone]         [name], MD Follow up.   Specialty: Internal Medicine Contact information: [place] [address] [phone]                 Wound care:   Incision (Closed) 04/24/21 Abdomen Other  (Comment) (Active)  Date First Assessed/Time First Assessed: 04/24/21 1744   Location: Abdomen  Location Orientation: Other (Comment)    Assessments 04/24/2021  5:00 PM 05/02/2021  9:20 PM  Dressing Type Opsite Post-Op Visible --  Site / Wound Assessment -- Clean;Dry  Margins -- Attached edges (approximated)  Closure Staples Staples  Drainage Amount -- None     No Linked orders to display    Discharge Exam:   Vitals:   05/02/21 1507 05/02/21 2010 05/03/21 0304 05/03/21 0751  BP: (!) 119/52 (!) 117/51 (!) 132/56 (!) 117/59  Pulse: (!) 110 (!) 109 (!) 107 (!) 103  Resp: 16 20 16 16   Temp: 98.6 F (37 C) 98.4 F (36.9 C) 97.8 F (36.6 C) 98.1 F (36.7 C)  TempSrc:  Oral Oral Oral  SpO2: 100% 98% 97% 98%  Weight:      Height:        Body mass index is 18.79 kg/m.  General exam: Pleasant, elderly Caucasian female.  Not in physical distress Skin: No rashes, lesions or ulcers. HEENT: Atraumatic, normocephalic, no obvious bleeding Lungs: Clear to auscultation bilaterally CVS: Improving tachycardia, regular rhythm, no murmur GI/Abd soft, nontender, nondistended, bowel sound present.  Ostomy bag with stool and gas CNS: Alert, awake, oriented x3 Psychiatry: Mood appropriate Extremities: No pedal edema, no calf tenderness  Time coordinating discharge: 35 minutes   The results of significant diagnostics from this hospitalization (including imaging, microbiology, ancillary and laboratory) are listed below for reference.    Procedures and Diagnostic Studies:   DG Abd 1 View  Result Date: 04/15/2021 CLINICAL DATA:  NG tube placement EXAM: ABDOMEN - 1 VIEW COMPARISON:  None. FINDINGS: NG tube tip is in the stomach. Dilated small bowel loops compatible with small bowel obstruction. IMPRESSION: NG tube tip in the stomach. Electronically Signed   By: Rolm Baptise M.D.   On: 04/15/2021 23:22   CT ABDOMEN PELVIS W CONTRAST  Result Date: 04/15/2021 CLINICAL DATA:  Abdominal  distension and bloating. EXAM: CT ABDOMEN AND PELVIS WITH CONTRAST TECHNIQUE: Multidetector CT imaging of the abdomen and pelvis was performed using the standard protocol following bolus administration of  intravenous contrast. CONTRAST:  59mL OMNIPAQUE IOHEXOL 300 MG/ML  SOLN COMPARISON:  01/14/2021 FINDINGS: Lower chest: Scar versus atelectasis identified within both lung bases. No pleural effusion. Hepatobiliary: No suspicious liver abnormality. Gallbladder appears distended. No gallstones or gallbladder wall thickening. No bile duct dilatation. Pancreas: Unremarkable. No pancreatic ductal dilatation or surrounding inflammatory changes. Spleen: Normal in size without focal abnormality. Adrenals/Urinary Tract: Normal adrenal glands. No kidney mass or hydronephrosis. Urinary bladder is unremarkable. Stomach/Bowel: Small hiatal hernia. Colonic diverticulosis identified involving the sigmoid colon. Wall thickening and inflammation of the sigmoid colon is identified. Inflammatory changes are identified within the pelvis including fat stranding and multiple small interloop fluid collections concerning for perforation with early abscess formation. Within the left pelvic sidewall there is a fluid collection measuring 3.5 x 1.6 cm, image 66/2. Within the posterior pelvis there is a fluid collection measuring 3.4 x 1.4 cm, image 65/2. Signs bowel obstruction vs ileus identified with multiple dilated loops of small bowel with air-fluid levels measuring up to 3.7 cm. There is a transition to decreased caliber distal small bowel loops within the right lower quadrant of the abdomen. Terminal ileum appears decompressed. The colon is distended up to the level of the mid descending colon measuring up to 6.7 cm in diameter. Vascular/Lymphatic: Aortic atherosclerosis. No abdominopelvic adenopathy. Reproductive: Status post hysterectomy. No adnexal masses. Other: Fat containing left inguinal hernia. Musculoskeletal: No acute or  significant osseous findings. IMPRESSION: 1. Examination is positive for acute sigmoid diverticulitis. Two small interloop fluid collections are identified within the pelvis concerning for perforation with early abscess formation. 2. Signs of bowel obstruction and/or ileus identified with transition to decreased caliber distal small bowel loops within the right lower quadrant of the abdomen. 3. Aortic Atherosclerosis (ICD10-I70.0). Critical Value/emergent results were called by telephone at the time of interpretation on 04/15/2021 at 6:39 am to provider [facility] , who verbally acknowledged these results. Electronically Signed   By: Kerby Moors M.D.   On: 04/15/2021 06:42   DG Abd Portable 1V-Small Bowel Obstruction Protocol-initial, 8 hr delay  Result Date: 04/16/2021 CLINICAL DATA:  Follow up small bowel obstruction EXAM: PORTABLE ABDOMEN - 1 VIEW COMPARISON:  04/15/2021 FINDINGS: Gastric catheter is again noted within the stomach. Administered contrast is seen within dilated loops of small bowel similar to that seen on the prior exam. Multiple ingested tablets are noted within the proximal colon. Contrast material has also passed into the proximal transverse colon. These changes are consistent with partial small bowel obstruction. The overall appearance is similar to that noted on prior CT examination and prior plain film. No free air is seen. Changes of prior vertebral augmentation are noted. IMPRESSION: Changes consistent with persistent partial small bowel obstruction. Contrast material remains in multiple dilated loops of small bowel but has passed into the proximal colon. Electronically Signed   By: Inez Catalina M.D.   On: 04/16/2021 19:21     Labs:   Basic Metabolic Panel: Recent Labs  Lab 04/29/21 0608 04/29/21 1818 04/30/21 0646 05/01/21 0522 05/02/21 0515  NA 147*  --  140 132* 130*  K 2.6*   < > 3.2* 3.5 3.4*  CL 111  --  110 104 100  CO2 29  --  26 25 24   GLUCOSE 156*  --   142* 147* 141*  BUN 23  --  18 17 19   CREATININE 0.64  --  0.57 0.38* 0.46  CALCIUM 8.5*  --  8.1* 8.2* 8.1*  MG 2.0  --   --   --  1.9  PHOS 3.2  --   --   --  3.4   < > = values in this interval not displayed.   GFR Estimated Creatinine Clearance: 45.4 mL/min (by C-G formula based on SCr of 0.46 mg/dL). Liver Function Tests: Recent Labs  Lab 04/29/21 0608 05/02/21 0515  AST 27 39  ALT 22 67*  ALKPHOS 85 81  BILITOT 0.2* 0.4  PROT 5.4* 4.8*  ALBUMIN 2.1* 2.0*   No results for input(s): LIPASE, AMYLASE in the last 168 hours. No results for input(s): AMMONIA in the last 168 hours. Coagulation profile No results for input(s): INR, PROTIME in the last 168 hours.  CBC: Recent Labs  Lab 04/29/21 0608 04/30/21 0646 05/01/21 0522 05/02/21 0515 05/03/21 0537  WBC 11.4* 8.7 8.4 7.1 8.5  NEUTROABS 9.8* 7.3 6.8 5.5 6.4  HGB 8.9* 8.6* 8.6* 8.3* 8.7*  HCT 28.0* 26.7* 26.4* 25.5* 26.5*  MCV 90.3 90.8 88.0 87.0 87.5  PLT 395 316 285 296 339   Cardiac Enzymes: No results for input(s): CKTOTAL, CKMB, CKMBINDEX, TROPONINI in the last 168 hours. BNP: Invalid input(s): POCBNP CBG: Recent Labs  Lab 05/01/21 0519 05/01/21 1225 05/01/21 1756 05/02/21 0514 05/02/21 1115  GLUCAP 137* 149* 143* 143* 120*   D-Dimer No results for input(s): DDIMER in the last 72 hours. Hgb A1c No results for input(s): HGBA1C in the last 72 hours. Lipid Profile No results for input(s): CHOL, HDL, LDLCALC, TRIG, CHOLHDL, LDLDIRECT in the last 72 hours. Thyroid function studies No results for input(s): TSH, T4TOTAL, T3FREE, THYROIDAB in the last 72 hours.  Invalid input(s): FREET3 Anemia work up No results for input(s): VITAMINB12, FOLATE, FERRITIN, TIBC, IRON, RETICCTPCT in the last 72 hours. Microbiology Recent Results (from the past 240 hour(s))  Aerobic/Anaerobic Culture w Gram Stain (surgical/deep wound)     Status: None   Collection Time: 04/24/21  2:26 PM   Specimen: PATH Other; Tissue   Result Value Ref Range Status   Specimen Description   Final    ABSCESS Performed at [facility], [address], [place], [place] [address]    Special Requests   Final    PELVIC ABSCESS Performed at [facility], [address] [place], [place] [address]    Gram Stain   Final    FEW WBC PRESENT, PREDOMINANTLY MONONUCLEAR FEW GRAM POSITIVE RODS RARE GRAM NEGATIVE RODS    Culture   Final    RARE ESCHERICHIA COLI RARE KLEBSIELLA PNEUMONIAE RARE BACTEROIDES FRAGILIS BETA LACTAMASE POSITIVE Performed at [facility], [place] [address] [place], [place] [address]    Report Status 04/28/2021 FINAL  Final   Organism ID, Bacteria ESCHERICHIA COLI  Final   Organism ID, Bacteria KLEBSIELLA PNEUMONIAE  Final      Susceptibility   Escherichia coli - MIC*    AMPICILLIN 8 SENSITIVE Sensitive     CEFAZOLIN <=4 SENSITIVE Sensitive     CEFEPIME <=0.12 SENSITIVE Sensitive     CEFTAZIDIME <=1 SENSITIVE Sensitive     CEFTRIAXONE <=0.25 SENSITIVE Sensitive     CIPROFLOXACIN <=0.25 SENSITIVE Sensitive     GENTAMICIN <=1 SENSITIVE Sensitive     IMIPENEM <=0.25 SENSITIVE Sensitive     TRIMETH/SULFA <=20 SENSITIVE Sensitive     AMPICILLIN/SULBACTAM 4 SENSITIVE Sensitive     PIP/TAZO <=4 SENSITIVE Sensitive     * RARE ESCHERICHIA COLI   Klebsiella pneumoniae - MIC*    AMPICILLIN RESISTANT Resistant     CEFAZOLIN <=4 SENSITIVE Sensitive  CEFEPIME <=0.12 SENSITIVE Sensitive     CEFTAZIDIME <=1 SENSITIVE Sensitive     CEFTRIAXONE <=0.25 SENSITIVE Sensitive     CIPROFLOXACIN <=0.25 SENSITIVE Sensitive     GENTAMICIN <=1 SENSITIVE Sensitive     IMIPENEM <=0.25 SENSITIVE Sensitive     TRIMETH/SULFA <=20 SENSITIVE Sensitive     AMPICILLIN/SULBACTAM 4 SENSITIVE Sensitive     PIP/TAZO <=4 SENSITIVE Sensitive     * RARE KLEBSIELLA PNEUMONIAE     Signed: Hailei Besser  [facility] 05/03/2021, 9:53 AM

## 2021-05-03 NOTE — TOC Transition Note (Signed)
Transition of Care [facility]) - CM/SW Discharge Note   Patient Details  Name: Mary Washington MRN: [id number] Date of Birth: August 23, 1945  [facility]) CM/SW Contact:  Candie Chroman, LCSW Phone Number: 05/03/2021, 9:57 AM   Clinical Narrative:   Patient has orders to discharge home today. [facility] representative is aware. [employer] representative will deliver 3-in-1 today. Asked RN to be sure she is sent home with a few days worth of colostomy supplies. No further concerns. CSW signing off.  Final next level of care: Home w Home Health Services Barriers to Discharge: Barriers Resolved   Patient Goals and CMS Choice   CMS Medicare.gov Compare Post Acute Care list provided to:: Patient Choice offered to / list presented to : Patient  Discharge Placement                    Patient and family notified of of transfer: 05/03/21  Discharge Plan and Services     Post Acute Care Choice: Durable Medical Equipment, Home Health          DME Arranged: 3-N-1 DME Agency: [employer] Date DME Agency Contacted: 05/03/21   Representative spoke with at DME Agency: Suanne Marker HH Arranged: RN, PT, OT [facility] Agency: [place] Date [place]: 05/03/21   Representative spoke with at [place]: Adela Lank  Social Determinants of Health ([place]) Interventions     Readmission Risk Interventions Readmission Risk Prevention Plan 05/01/2021  PCP or Specialist Appt within 3-5 Days Complete  Social Work Consult for [place] Planning/Counseling Complete  Palliative Care Screening Not Applicable  Some recent data might be hidden

## 2021-05-17 ENCOUNTER — Other Ambulatory Visit: Payer: Self-pay | Admitting: Internal Medicine

## 2021-05-20 ENCOUNTER — Inpatient Hospital Stay: Payer: Medicare Other

## 2021-05-20 ENCOUNTER — Other Ambulatory Visit: Payer: Self-pay

## 2021-05-20 ENCOUNTER — Encounter: Payer: Self-pay | Admitting: Nurse Practitioner

## 2021-05-20 ENCOUNTER — Inpatient Hospital Stay: Payer: Medicare Other | Attending: Nurse Practitioner | Admitting: Nurse Practitioner

## 2021-05-20 VITALS — BP 129/62 | HR 94 | Temp 98.3°F | Resp 16 | Ht 62.0 in | Wt 110.0 lb

## 2021-05-20 DIAGNOSIS — E871 Hypo-osmolality and hyponatremia: Secondary | ICD-10-CM | POA: Insufficient documentation

## 2021-05-20 DIAGNOSIS — C8581 Other specified types of non-Hodgkin lymphoma, lymph nodes of head, face, and neck: Secondary | ICD-10-CM | POA: Diagnosis not present

## 2021-05-20 DIAGNOSIS — C8338 Diffuse large B-cell lymphoma, lymph nodes of multiple sites: Secondary | ICD-10-CM | POA: Diagnosis present

## 2021-05-20 DIAGNOSIS — Z95828 Presence of other vascular implants and grafts: Secondary | ICD-10-CM

## 2021-05-20 DIAGNOSIS — K5792 Diverticulitis of intestine, part unspecified, without perforation or abscess without bleeding: Secondary | ICD-10-CM

## 2021-05-20 DIAGNOSIS — Z79899 Other long term (current) drug therapy: Secondary | ICD-10-CM | POA: Diagnosis not present

## 2021-05-20 DIAGNOSIS — Z9221 Personal history of antineoplastic chemotherapy: Secondary | ICD-10-CM | POA: Diagnosis not present

## 2021-05-20 DIAGNOSIS — K56609 Unspecified intestinal obstruction, unspecified as to partial versus complete obstruction: Secondary | ICD-10-CM | POA: Diagnosis not present

## 2021-05-20 DIAGNOSIS — E860 Dehydration: Secondary | ICD-10-CM

## 2021-05-20 DIAGNOSIS — Z933 Colostomy status: Secondary | ICD-10-CM | POA: Insufficient documentation

## 2021-05-20 LAB — CBC WITH DIFFERENTIAL/PLATELET
Abs Immature Granulocytes: 0.04 10*3/uL (ref 0.00–0.07)
Basophils Absolute: 0 10*3/uL (ref 0.0–0.1)
Basophils Relative: 0 %
Eosinophils Absolute: 0.5 10*3/uL (ref 0.0–0.5)
Eosinophils Relative: 8 %
HCT: 32.3 % — ABNORMAL LOW (ref 36.0–46.0)
Hemoglobin: 10.3 g/dL — ABNORMAL LOW (ref 12.0–15.0)
Immature Granulocytes: 1 %
Lymphocytes Relative: 13 %
Lymphs Abs: 0.9 10*3/uL (ref 0.7–4.0)
MCH: 27.7 pg (ref 26.0–34.0)
MCHC: 31.9 g/dL (ref 30.0–36.0)
MCV: 86.8 fL (ref 80.0–100.0)
Monocytes Absolute: 0.7 10*3/uL (ref 0.1–1.0)
Monocytes Relative: 10 %
Neutro Abs: 4.6 10*3/uL (ref 1.7–7.7)
Neutrophils Relative %: 68 %
Platelets: 289 10*3/uL (ref 150–400)
RBC: 3.72 MIL/uL — ABNORMAL LOW (ref 3.87–5.11)
RDW: 15.9 % — ABNORMAL HIGH (ref 11.5–15.5)
WBC: 6.7 10*3/uL (ref 4.0–10.5)
nRBC: 0 % (ref 0.0–0.2)

## 2021-05-20 LAB — BASIC METABOLIC PANEL
Anion gap: 8 (ref 5–15)
BUN: 15 mg/dL (ref 8–23)
CO2: 26 mmol/L (ref 22–32)
Calcium: 9.1 mg/dL (ref 8.9–10.3)
Chloride: 97 mmol/L — ABNORMAL LOW (ref 98–111)
Creatinine, Ser: 0.57 mg/dL (ref 0.44–1.00)
GFR, Estimated: >60 mL/min (ref >60)
Glucose, Bld: 133 mg/dL — ABNORMAL HIGH (ref 70–99)
Potassium: 3.9 mmol/L (ref 3.5–5.1)
Sodium: 131 mmol/L — ABNORMAL LOW (ref 135–145)

## 2021-05-20 LAB — LACTATE DEHYDROGENASE: LDH: 160 U/L (ref 98–192)

## 2021-05-20 MED ORDER — HEPARIN SOD (PORK) LOCK FLUSH 100 UNIT/ML IV SOLN
500.0000 [IU] | Freq: Once | INTRAVENOUS | Status: AC
  Administered 2021-05-20: 12:00:00 500 [IU] via INTRAVENOUS
  Filled 2021-05-20: qty 5

## 2021-05-20 MED ORDER — SODIUM CHLORIDE 0.9% FLUSH
10.0000 mL | Freq: Once | INTRAVENOUS | Status: AC
  Administered 2021-05-20: 10:00:00 10 mL via INTRAVENOUS
  Filled 2021-05-20: qty 10

## 2021-05-20 MED ORDER — SODIUM CHLORIDE 0.9 % IV SOLN
INTRAVENOUS | Status: AC
  Filled 2021-05-20 (×2): qty 250

## 2021-05-20 NOTE — Progress Notes (Signed)
[place] NOTE  Patient Care Team: [name], MD as PCP - General (Internal Medicine) [name], MD as Consulting Physician (Oncology)  CHIEF COMPLAINTS/PURPOSE OF CONSULTATION: Lymphoma Oncology History Overview Note   # Incidental- MAY 2022-CT scan chest abdomen pelvis consistent with bulky retroperitoneal adenopathy [6-8 cm]; subpectoral/axillary lymphadenopathy 2 to 2.5 cm; PET June 2022-bulky right axillary/subpectoral; bulky retroperitoneal adenopathy.  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  Repeat MRI 3-6 m   # coloscopy [2022; [facility]]; mammogram- nov 2021-WNL.   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 .  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  Walking with a rolling walker.   Mary Washington 75 y.o.  female n diffuse large B-cell lymphoma stage III CURRENTLY ON R-CHOP chemotherapy- is here for hospital follow up.   Patient presented to the ER on 11/14  for evaluation of abdominal distention, bloating.  CT scan in the ER showed acute sigmoid diverticulitis with concern for perforation and abscess.  She underwent a sigmoid colon resection with colostomy, small bowel resection with anastomosis, excision of mesenteric mass and draining of multiple abscesses.  Mesenteric mass pathology: hyalinized nodule with degenerative changes and focal calcifications without evidence of malignancy. Intra-abdominal abscess culture grew E. coli.  She continued IV Unasyn.  Suffered a postop ileus.  Received TPN.  She was discharged from the hospital to home with PT and OT on 05/03/2021 She continues to get stronger.  Has ongoing back pain which she was told is expected from physical therapy due to compensating postoperatively.  No fevers or chills.  Normal stool output in her colostomy bag.  No worsening shortness of breath, cough, fevers, chills.  She feels like she is getting stronger.  Her husband accompanies her today and confirms this.  Eating and drinking as tolerated.  Review of Systems  Constitutional:  Positive for malaise/fatigue. Negative for chills, diaphoresis and fever.  HENT:  Negative for nosebleeds and sore throat.   Eyes:  Negative for double vision.  Respiratory:  Negative for cough, hemoptysis, sputum production, shortness of breath and wheezing.   Cardiovascular:  Negative for chest pain, palpitations, orthopnea and leg swelling.  Gastrointestinal:  Negative for abdominal pain, blood in stool, constipation, diarrhea, heartburn, melena, nausea and vomiting.  Genitourinary:  Negative for dysuria, frequency and urgency.  Musculoskeletal:  Positive for back pain and joint pain. Negative for falls and myalgias.  Skin: Negative.  Negative for itching and rash.  Neurological:  Negative for dizziness, tingling, focal weakness, weakness and headaches.  Endo/Heme/Allergies:  Does not bruise/bleed easily.  Psychiatric/Behavioral:  Negative for depression. The  patient is not nervous/anxious and does not have insomnia.     MEDICAL HISTORY:  Past Medical History:  Diagnosis Date   Colon polyps    Diverticulosis    Melanoma ([place]) 1988   lt upper arm, some lymph nodes removed   Mild intermittent asthma    Osteoporosis     SURGICAL HISTORY: Past Surgical History:  Procedure Laterality Date   BACK SURGERY     CATARACT EXTRACTION     IR IMAGING GUIDED PORT INSERTION  11/23/2020   PARTIAL COLECTOMY N/A 04/24/2021   Procedure: PARTIAL COLECTOMY;  Surgeon: Herbert Pun, MD;  Location: [facility] ORS;  Service: General;  Laterality: N/A;   TONSILLECTOMY     VAGINAL HYSTERECTOMY  1990    SOCIAL HISTORY: Social History   Socioeconomic History   Marital status: Married    Spouse name: Mikki Santee   Number of children: 2   Years of education: Not on file   Highest education level: Not on file  Occupational History   Not on file  Tobacco Use   Smoking status: Never   Smokeless tobacco: Never  Substance and Sexual Activity   Alcohol use: Never   Drug use: Never   Sexual activity: Yes    Partners: Male  Other Topics Concern   Not on file  Social History Narrative   ** Merged History Encounter **       Lives at home with husband.    Social Determinants of Health   Financial Resource Strain: Not on file  Food Insecurity: Not on file  Transportation Needs: Not on file  Physical Activity: Not on file  Stress: Not on file  Social Connections: Not on file  Intimate Partner Violence: Not on file    FAMILY HISTORY: Family History  Problem Relation Age of Onset   Breast cancer Neg Hx     ALLERGIES:  has No Known Allergies.  MEDICATIONS:  Current Outpatient Medications  Medication Sig Dispense Refill   acetaminophen (TYLENOL) 500 MG tablet Take 500 mg by mouth every 6 (six) hours as needed.     acyclovir (ZOVIRAX) 400 MG tablet Take 1 tablet (400 mg total) by mouth 2 (two) times daily. To prevent shingles 60 tablet 3    Cholecalciferol (VITAMIN D) 50 MCG (2000 UT) CAPS Take 1 capsule by mouth daily.     lidocaine-prilocaine (EMLA) cream lidocaine-prilocaine 2.5 %-2.5 % topical cream  APPLY TOPICALLY TO THE AFFECTED AREA 1 TIME     Magnesium Oxide (MAG-OXIDE PO) Take 1 capsule by mouth daily at 12 noon.     meloxicam (MOBIC) 15 MG tablet Take 1 tablet by mouth daily.     metoprolol tartrate (LOPRESSOR) 25 MG tablet Take 1 tablet (25 mg total) by mouth 2 (two) times daily. 60 tablet 0   montelukast (SINGULAIR) 10 MG tablet TAKE 1 TABLET(10 MG) BY MOUTH AT BEDTIME. START 2 DAYS BEFORE INFUSION. TAKE IT FOR 4 DAYS 90 tablet 1   Multiple Vitamins-Minerals (WOMENS MULTIVITAMIN PO) Take by mouth.     omeprazole (PRILOSEC) 20 MG capsule Take 20 mg by mouth 2 (two) times daily.     potassium chloride SA (KLOR-CON) 20 MEQ tablet TAKE 1 TABLET BY MOUTH TWICE DAILY 180 tablet 3   traMADol (ULTRAM) 50 MG tablet Take 1 tablet by mouth every 6 (six) hours as needed.  allopurinol (ZYLOPRIM) 300 MG tablet TAKE 1 TABLET(300 MG) BY MOUTH TWICE DAILY TO AVOID GOUT FLARE UP WHILE ON CHEMO (Patient not taking: Reported on 05/20/2021) 60 tablet 2   No current facility-administered medications for this visit.   Facility-Administered Medications Ordered in Other Visits  Medication Dose Route Frequency Provider Last Rate Last Admin   sodium chloride flush (NS) 0.9 % injection 10 mL  10 mL Intravenous PRN [name], MD   10 mL at 01/07/21 0919      .  PHYSICAL EXAMINATION: ECOG PERFORMANCE STATUS: 1 - Symptomatic but completely ambulatory  Vitals:   05/20/21 0944  BP: 129/62  Pulse: 94  Resp: 16  Temp: 98.3 F (36.8 C)  SpO2: 100%   Filed Weights   05/20/21 0944  Weight: 110 lb (49.9 kg)     Physical Exam Constitutional:      Appearance: She is ill-appearing.     Comments: Wheelchair. Accompanied by husband.   HENT:     Head: Normocephalic and atraumatic.     Mouth/Throat:     Pharynx: No  oropharyngeal exudate.  Eyes:     Conjunctiva/sclera: Conjunctivae normal.  Cardiovascular:     Rate and Rhythm: Normal rate and regular rhythm.  Pulmonary:     Effort: Pulmonary effort is normal. No respiratory distress.     Breath sounds: Normal breath sounds. No wheezing.  Abdominal:     General: There is no distension.     Palpations: There is no mass.     Tenderness: There is no abdominal tenderness. There is no guarding or rebound.     Comments: Colostomy in place. Soft stool in bag.   Musculoskeletal:        General: No tenderness. Normal range of motion.     Cervical back: Normal range of motion and neck supple.     Right lower leg: No edema.     Left lower leg: No edema.  Skin:    General: Skin is warm.  Neurological:     Mental Status: She is alert and oriented to person, place, and time.  Psychiatric:        Mood and Affect: Mood and affect normal.        Behavior: Behavior normal.     LABORATORY DATA:  I have reviewed the data as listed Lab Results  Component Value Date   WBC 6.7 05/20/2021   HGB 10.3 (L) 05/20/2021   HCT 32.3 (L) 05/20/2021   MCV 86.8 05/20/2021   PLT 289 05/20/2021   Recent Labs    04/25/21 0440 04/29/21 0608 04/29/21 1818 05/01/21 0522 05/02/21 0515 05/20/21 0932  NA 138 147*   < > 132* 130* 131*  K 4.0 2.6*   < > 3.5 3.4* 3.9  CL 108 111   < > 104 100 97*  CO2 26 29   < > 25 24 26   GLUCOSE 165* 156*   < > 147* 141* 133*  BUN 25* 23   < > 17 19 15   CREATININE 0.57 0.64   < > 0.38* 0.46 0.57  CALCIUM 8.5* 8.5*   < > 8.2* 8.1* 9.1  GFRNONAA >60 >60   < > >60 >60 >60  PROT 4.9* 5.4*  --   --  4.8*  --   ALBUMIN 2.3* 2.1*  --   --  2.0*  --   AST 17 27  --   --  39  --   ALT 13 22  --   --  67*  --   ALKPHOS 63 85  --   --  81  --   BILITOT 0.3 0.2*  --   --  0.4  --    < > = values in this interval not displayed.     RADIOGRAPHIC STUDIES: I have personally reviewed the radiological images as listed and agreed with the  findings in the report.   ASSESSMENT & PLAN: Patient is 75 year old female with history of diffuse B-cell lymphoma currently status post R-CHOP chemotherapy who returns to clinic for hospital follow-up.  Diffuse large B-cell lymphoma-stage III-currently status post cycle 5 of R-CHOP chemotherapy.  Post 3 cycles she had significant clinical response from chemotherapy.  Continue to hold chemotherapy.  Plan for PET scan and then will consider if she needs additional chemo.  Neutropenia-previously suffered severe neutropenia secondary to chemotherapy.  She received long-acting G-CSF.  ANC and WBCs have now recovered.  Monitor.  Diverticulitis with pelvic abscess and SBO- s/p hospitalization from 11/14-12/2 with partial bowel obstruction/ileus status post colostomy and IV antibiotics.  Discharged on 12/2 to home.  Tolerating orals well with no evidence of recurrent symptoms.  Hypokalemia- resolved. K 3.9. No iv replacement today. Monitor.   Hyponatremia- IV fluids today. Increase fluid and sodium intake at home.   Follow-up with Dr. B as planned for PET scan, labs, follow-up.   No problem-specific Assessment & Plan notes found for this encounter.  All questions were answered. The patient knows to call the clinic with any problems, questions or concerns.   Verlon Au, NP 05/20/2021

## 2021-06-12 ENCOUNTER — Encounter: Payer: Self-pay | Admitting: Nurse Practitioner

## 2021-07-03 ENCOUNTER — Other Ambulatory Visit: Payer: Self-pay | Admitting: Internal Medicine

## 2021-07-03 ENCOUNTER — Encounter
Admission: RE | Admit: 2021-07-03 | Discharge: 2021-07-03 | Disposition: A | Discharge status: Home / Self Care | Payer: Medicare Other | Source: Ambulatory Visit | Attending: Internal Medicine | Admitting: Internal Medicine

## 2021-07-03 ENCOUNTER — Other Ambulatory Visit (HOSPITAL_COMMUNITY): Payer: Self-pay | Admitting: Internal Medicine

## 2021-07-03 ENCOUNTER — Other Ambulatory Visit: Payer: Self-pay

## 2021-07-03 DIAGNOSIS — S22000A Wedge compression fracture of unspecified thoracic vertebra, initial encounter for closed fracture: Secondary | ICD-10-CM

## 2021-07-03 DIAGNOSIS — C8581 Other specified types of non-Hodgkin lymphoma, lymph nodes of head, face, and neck: Secondary | ICD-10-CM | POA: Insufficient documentation

## 2021-07-03 DIAGNOSIS — M549 Dorsalgia, unspecified: Secondary | ICD-10-CM

## 2021-07-03 MED ORDER — FLUDEOXYGLUCOSE F - 18 (FDG) INJECTION
5.7000 | Freq: Once | INTRAVENOUS | Status: AC | PRN
  Administered 2021-07-03: 5.9 via INTRAVENOUS

## 2021-07-08 ENCOUNTER — Telehealth: Payer: Self-pay | Admitting: Internal Medicine

## 2021-07-08 ENCOUNTER — Inpatient Hospital Stay: Payer: Medicare Other | Admitting: Internal Medicine

## 2021-07-08 ENCOUNTER — Other Ambulatory Visit: Payer: Self-pay

## 2021-07-08 ENCOUNTER — Inpatient Hospital Stay: Payer: Medicare Other | Attending: Internal Medicine

## 2021-07-08 ENCOUNTER — Encounter: Payer: Self-pay | Admitting: Internal Medicine

## 2021-07-08 DIAGNOSIS — E876 Hypokalemia: Secondary | ICD-10-CM | POA: Diagnosis not present

## 2021-07-08 DIAGNOSIS — C8338 Diffuse large B-cell lymphoma, lymph nodes of multiple sites: Secondary | ICD-10-CM | POA: Diagnosis not present

## 2021-07-08 DIAGNOSIS — R748 Abnormal levels of other serum enzymes: Secondary | ICD-10-CM | POA: Diagnosis not present

## 2021-07-08 DIAGNOSIS — Z79899 Other long term (current) drug therapy: Secondary | ICD-10-CM | POA: Diagnosis not present

## 2021-07-08 DIAGNOSIS — M549 Dorsalgia, unspecified: Secondary | ICD-10-CM | POA: Diagnosis not present

## 2021-07-08 DIAGNOSIS — C8581 Other specified types of non-Hodgkin lymphoma, lymph nodes of head, face, and neck: Secondary | ICD-10-CM | POA: Diagnosis not present

## 2021-07-08 DIAGNOSIS — E871 Hypo-osmolality and hyponatremia: Secondary | ICD-10-CM | POA: Insufficient documentation

## 2021-07-08 DIAGNOSIS — G8929 Other chronic pain: Secondary | ICD-10-CM | POA: Insufficient documentation

## 2021-07-08 DIAGNOSIS — G629 Polyneuropathy, unspecified: Secondary | ICD-10-CM | POA: Diagnosis not present

## 2021-07-08 LAB — CBC WITH DIFFERENTIAL/PLATELET
Abs Immature Granulocytes: 0.05 10*3/uL (ref 0.00–0.07)
Basophils Absolute: 0 10*3/uL (ref 0.0–0.1)
Basophils Relative: 0 %
Eosinophils Absolute: 0.1 10*3/uL (ref 0.0–0.5)
Eosinophils Relative: 2 %
HCT: 38 % (ref 36.0–46.0)
Hemoglobin: 12.4 g/dL (ref 12.0–15.0)
Immature Granulocytes: 1 %
Lymphocytes Relative: 25 %
Lymphs Abs: 1.9 10*3/uL (ref 0.7–4.0)
MCH: 27.8 pg (ref 26.0–34.0)
MCHC: 32.6 g/dL (ref 30.0–36.0)
MCV: 85.2 fL (ref 80.0–100.0)
Monocytes Absolute: 0.8 10*3/uL (ref 0.1–1.0)
Monocytes Relative: 11 %
Neutro Abs: 4.7 10*3/uL (ref 1.7–7.7)
Neutrophils Relative %: 61 %
Platelets: 311 10*3/uL (ref 150–400)
RBC: 4.46 MIL/uL (ref 3.87–5.11)
RDW: 14.2 % (ref 11.5–15.5)
WBC: 7.7 10*3/uL (ref 4.0–10.5)
nRBC: 0 % (ref 0.0–0.2)

## 2021-07-08 LAB — COMPREHENSIVE METABOLIC PANEL
ALT: 19 U/L (ref 0–44)
AST: 22 U/L (ref 15–41)
Albumin: 3.8 g/dL (ref 3.5–5.0)
Alkaline Phosphatase: 164 U/L — ABNORMAL HIGH (ref 38–126)
Anion gap: 10 (ref 5–15)
BUN: 17 mg/dL (ref 8–23)
CO2: 24 mmol/L (ref 22–32)
Calcium: 9.2 mg/dL (ref 8.9–10.3)
Chloride: 93 mmol/L — ABNORMAL LOW (ref 98–111)
Creatinine, Ser: 0.77 mg/dL (ref 0.44–1.00)
GFR, Estimated: >60 mL/min (ref >60)
Glucose, Bld: 131 mg/dL — ABNORMAL HIGH (ref 70–99)
Potassium: 4.4 mmol/L (ref 3.5–5.1)
Sodium: 127 mmol/L — ABNORMAL LOW (ref 135–145)
Total Bilirubin: 0.2 mg/dL — ABNORMAL LOW (ref 0.3–1.2)
Total Protein: 6.1 g/dL — ABNORMAL LOW (ref 6.5–8.1)

## 2021-07-08 LAB — LACTATE DEHYDROGENASE: LDH: 155 U/L (ref 98–192)

## 2021-07-08 MED ORDER — HEPARIN SOD (PORK) LOCK FLUSH 100 UNIT/ML IV SOLN
500.0000 [IU] | Freq: Once | INTRAVENOUS | Status: AC
  Administered 2021-07-08: 500 [IU] via INTRAVENOUS
  Filled 2021-07-08: qty 5

## 2021-07-08 MED ORDER — SODIUM CHLORIDE 0.9% FLUSH
10.0000 mL | Freq: Once | INTRAVENOUS | Status: AC
  Administered 2021-07-08: 10 mL via INTRAVENOUS
  Filled 2021-07-08: qty 10

## 2021-07-08 NOTE — Patient Instructions (Signed)
#  Okay to stop taking potassium pills.

## 2021-07-08 NOTE — Progress Notes (Signed)
Pt states she has a compression fracture in her back and is sch'd for an MRI.

## 2021-07-08 NOTE — Progress Notes (Signed)
[name] NOTE  Patient Care Team: [name], MD as PCP - General (Internal Medicine) [name], MD as Consulting Physician (Oncology)  CHIEF COMPLAINTS/PURPOSE OF CONSULTATION: Lymphoma Oncology History Overview Note   # Incidental- MAY 2022-CT scan chest abdomen pelvis consistent with bulky retroperitoneal adenopathy [6-8 cm]; subpectoral/axillary lymphadenopathy 2 to 2.5 cm; PET June 2022-bulky right axillary/subpectoral; bulky retroperitoneal adenopathy.  S/p right axilla lymph node biopsy--large B-cell lymphoma; FISH panel QNS; repeat biopsy  # June 29th, 2022-rituximab only infusion; November 2022-cycle #5 R-CHOP chemotherapy-discontinued chemotherapy [complicated diverticulitis]; FEB 2023-PET scan NED  #    # May 2022-Pancreatic head/uncinate cyst -hypodense 1.3 x 1.0 x 0.9 cm lesion-MRI suggestive of IMPN.  Repeat MRI 3-6 m   # coloscopy [2022; [facility]]; mammogram- nov 2021-WNL.   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 .  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  Walking with a rolling walker.   Mary Washington 76 y.o.  female n diffuse large  B-cell lymphoma stage III STATUS POST 5 CYCLES OF R-CHOP chemotherapy- is here for a follow-up/review results of the PET scan.  Patient after 5 cycles of chemotherapy was admitted to the hospital for sigmoid diverticulitis complicated with neutropenia from chemotherapy.  Patient underwent hemicolectomy; colostomy.   Patient is currently home.  Continues to have chronic back pain. No worsening shortness of breath or cough.  Appetite is good.  She is walking with a walker.    Review of Systems  Constitutional:  Positive for malaise/fatigue. Negative for chills, diaphoresis and fever.  HENT:  Negative for nosebleeds and sore throat.   Eyes:  Negative for double vision.  Respiratory:  Negative for cough, hemoptysis, sputum production, shortness of breath and wheezing.   Cardiovascular:  Negative for chest pain, palpitations, orthopnea and leg swelling.  Gastrointestinal:  Negative for abdominal pain, blood in stool, diarrhea, heartburn, melena, nausea and vomiting.  Genitourinary:  Negative for dysuria, frequency and urgency.  Musculoskeletal:  Positive for joint pain.  Skin: Negative.  Negative for itching and rash.  Neurological:  Negative for dizziness, tingling, focal weakness, weakness and headaches.  Endo/Heme/Allergies:  Does not bruise/bleed easily.  Psychiatric/Behavioral:  Negative for depression. The patient is not nervous/anxious and does not have insomnia.     MEDICAL HISTORY:  Past Medical History:  Diagnosis Date   Colon polyps    Diverticulosis    Melanoma ([place]) 1988   lt upper arm, some lymph nodes removed   Mild intermittent asthma    Osteoporosis     SURGICAL HISTORY: Past Surgical History:  Procedure Laterality Date   BACK SURGERY  CATARACT EXTRACTION     IR IMAGING GUIDED PORT INSERTION  11/23/2020   PARTIAL COLECTOMY N/A 04/24/2021   Procedure: PARTIAL COLECTOMY;  Surgeon: Herbert Pun, MD;  Location: [facility] ORS;  Service: General;  Laterality: N/A;    TONSILLECTOMY     VAGINAL HYSTERECTOMY  1990    SOCIAL HISTORY: Social History   Socioeconomic History   Marital status: Married    Spouse name: Mikki Santee   Number of children: 2   Years of education: Not on file   Highest education level: Not on file  Occupational History   Not on file  Tobacco Use   Smoking status: Never   Smokeless tobacco: Never  Vaping Use   Vaping Use: Never used  Substance and Sexual Activity   Alcohol use: Never   Drug use: Never   Sexual activity: Yes    Partners: Male  Other Topics Concern   Not on file  Social History Narrative   ** Merged History Encounter **       Lives at home with husband.    Social Determinants of Health   Financial Resource Strain: Not on file  Food Insecurity: Not on file  Transportation Needs: Not on file  Physical Activity: Not on file  Stress: Not on file  Social Connections: Not on file  Intimate Partner Violence: Not on file    FAMILY HISTORY: Family History  Problem Relation Age of Onset   Breast cancer Neg Hx     ALLERGIES:  has No Known Allergies.  MEDICATIONS:  Current Outpatient Medications  Medication Sig Dispense Refill   acetaminophen (TYLENOL) 500 MG tablet Take 500 mg by mouth every 6 (six) hours as needed.     Cholecalciferol (VITAMIN D) 50 MCG (2000 UT) CAPS Take 1 capsule by mouth daily.     lidocaine-prilocaine (EMLA) cream lidocaine-prilocaine 2.5 %-2.5 % topical cream  APPLY TOPICALLY TO THE AFFECTED AREA 1 TIME     Magnesium Oxide (MAG-OXIDE PO) Take 1 capsule by mouth daily at 12 noon.     meloxicam (MOBIC) 15 MG tablet Take 1 tablet by mouth daily.     metoprolol tartrate (LOPRESSOR) 25 MG tablet Take 25 mg by mouth in the morning and at bedtime.     Multiple Vitamins-Minerals (WOMENS MULTIVITAMIN PO) Take by mouth.     omeprazole (PRILOSEC) 20 MG capsule Take 20 mg by mouth 2 (two) times daily.     potassium chloride SA (KLOR-CON) 20 MEQ tablet TAKE 1 TABLET BY MOUTH TWICE DAILY  (Patient taking differently: 20 mEq once.) 180 tablet 3   traMADol (ULTRAM) 50 MG tablet Take 1 tablet by mouth every 6 (six) hours as needed.     acyclovir (ZOVIRAX) 400 MG tablet Take 1 tablet (400 mg total) by mouth 2 (two) times daily. To prevent shingles (Patient not taking: Reported on 07/08/2021) 60 tablet 3   allopurinol (ZYLOPRIM) 300 MG tablet TAKE 1 TABLET(300 MG) BY MOUTH TWICE DAILY TO AVOID GOUT FLARE UP WHILE ON CHEMO (Patient not taking: Reported on 05/20/2021) 60 tablet 2   metoprolol tartrate (LOPRESSOR) 25 MG tablet Take 1 tablet (25 mg total) by mouth 2 (two) times daily. 60 tablet 0   montelukast (SINGULAIR) 10 MG tablet TAKE 1 TABLET(10 MG) BY MOUTH AT BEDTIME. START 2 DAYS BEFORE INFUSION. TAKE IT FOR 4 DAYS (Patient not taking: Reported on 07/08/2021) 90 tablet 1   No current facility-administered medications for this visit.   Facility-Administered Medications Ordered in Other Visits  Medication  Dose Route Frequency Provider Last Rate Last Admin   sodium chloride flush (NS) 0.9 % injection 10 mL  10 mL Intravenous PRN [name], MD   10 mL at 01/07/21 0919      .  PHYSICAL EXAMINATION: ECOG PERFORMANCE STATUS: 1 - Symptomatic but completely ambulatory  Vitals:   07/08/21 1421  BP: 139/68  Pulse: 97  Temp: 97.8 F (36.6 C)  SpO2: 100%   Filed Weights   07/08/21 1421  Weight: 108 lb 3.2 oz (49.1 kg)     Physical Exam HENT:     Head: Normocephalic and atraumatic.     Mouth/Throat:     Pharynx: No oropharyngeal exudate.  Eyes:     Pupils: Pupils are equal, round, and reactive to light.  Cardiovascular:     Rate and Rhythm: Normal rate and regular rhythm.  Pulmonary:     Effort: Pulmonary effort is normal. No respiratory distress.     Breath sounds: Normal breath sounds. No wheezing.  Abdominal:     General: Bowel sounds are normal. There is no distension.     Palpations: Abdomen is soft. There is no mass.     Tenderness: There is no  abdominal tenderness. There is no guarding or rebound.  Musculoskeletal:        General: Normal range of motion.     Cervical back: Normal range of motion and neck supple.  Skin:    General: Skin is warm.  Neurological:     Mental Status: She is alert and oriented to person, place, and time.  Psychiatric:        Mood and Affect: Affect normal.     LABORATORY DATA:  I have reviewed the data as listed Lab Results  Component Value Date   WBC 7.7 07/08/2021   HGB 12.4 07/08/2021   HCT 38.0 07/08/2021   MCV 85.2 07/08/2021   PLT 311 07/08/2021   Recent Labs    04/29/21 0608 04/29/21 1818 05/02/21 0515 05/20/21 0932 07/08/21 1400  NA 147*   < > 130* 131* 127*  K 2.6*   < > 3.4* 3.9 4.4  CL 111   < > 100 97* 93*  CO2 29   < > 24 26 24   GLUCOSE 156*   < > 141* 133* 131*  BUN 23   < > 19 15 17   CREATININE 0.64   < > 0.46 0.57 0.77  CALCIUM 8.5*   < > 8.1* 9.1 9.2  GFRNONAA >60   < > >60 >60 >60  PROT 5.4*  --  4.8*  --  6.1*  ALBUMIN 2.1*  --  2.0*  --  3.8  AST 27  --  39  --  22  ALT 22  --  67*  --  19  ALKPHOS 85  --  81  --  164*  BILITOT 0.2*  --  0.4  --  0.2*   < > = values in this interval not displayed.    RADIOGRAPHIC STUDIES: I have personally reviewed the radiological images as listed and agreed with the findings in the report.   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  PET June 2022-bulky right axillary/subpectoral; bulky retroperitoneal adenopathy; s/p 5 cycles of R-CHOP; discontinued cycle #6 [complicated sigmoid diverticulitis]-FEB 3rd, 2023- PET scan July 06, 2020-no residual/recurrent lymphoma.  #Discussed that cycle #6 of R-CHOP chemotherapy discontinued because of patient's complicated diverticulitis.  I would recommend continued clinical surveillance at this time.   #PET scan February 2023 incidental  multiple thoracolumbar compression fractures progressive T7-T10- ?  Osteoporotic; NO BMD since 2016 [prior in [place], [place]]-awaiting MRI with PCP.  Discussed regarding use of injections for treatment of osteoporosis/PCP/endocrinology.  #Complicated sigmoid diverticulitis [NOV 2022] s/p left hemicolectomy [Dr.Cintron]-no evidence of any fluid collections on recent PET scan.  Patient awaiting reevaluation for reversal of colostomy in MAY.   # Hyponatremia sodium 127- chronic- 130s- stable-likely poor p.o. intake.; increased salt intake; STABLE.   # Hypokalemia: Potassium 4.4.  Can discontinue potassium pills.  #Mildly elevated alkaline phosphatase-rest of the LFTs/bilirubin normal-monitor for now/history of IPMN.  Every 2023 PET scan negative.  #Peripheral neuropathy grade 1-secondary vincristine.  Monitor for now.stable  # chronic back pain-likely secondary to lymphoma impinging the neuroforaminal nerve- Improved; # Left knee pain/stiff [s/p Evaluation Ortho]- on volatren gel-stable  * [phone] [home]  # DISPOSITION: # port flush in 6 weeks # follow up in 3 months- MD; labs- cbc/cmo; port flush--Dr.B  # I reviewed the blood work- with the patient in detail; also reviewed the imaging independently [as summarized above]; and with the patient in detail.         All questions were answered. The patient knows to call the clinic with any problems, questions or concerns.    [name], MD 07/08/2021 4:16 PM

## 2021-07-08 NOTE — Telephone Encounter (Signed)
On 2/03-I spoke to patient/husband regarding the results of the PET scan-good response.  Follow-up as planned.

## 2021-07-08 NOTE — Assessment & Plan Note (Addendum)
#  Diffuse large B-cell lymphoma-stage III.  PET June 2022-bulky right axillary/subpectoral; bulky retroperitoneal adenopathy; s/p 5 cycles of R-CHOP; discontinued cycle #6 [complicated sigmoid diverticulitis]-FEB 3rd, 2023- PET scan July 06, 2020-no residual/recurrent lymphoma.  #Discussed that cycle #6 of R-CHOP chemotherapy discontinued because of patient's complicated diverticulitis.  I would recommend continued clinical surveillance at this time.   #PET scan February 2023 incidental  multiple thoracolumbar compression fractures progressive T7-T10- ? Osteoporotic; NO BMD since 2016 [prior in [place], [place]]-awaiting MRI with PCP.  Discussed regarding use of injections for treatment of osteoporosis/PCP/endocrinology.  #Complicated sigmoid diverticulitis [NOV 2022] s/p left hemicolectomy [Dr.Cintron]-no evidence of any fluid collections on recent PET scan.  Patient awaiting reevaluation for reversal of colostomy in MAY.   # Hyponatremia sodium 127- chronic- 130s- stable-likely poor p.o. intake.; increased salt intake; STABLE.   # Hypokalemia: Potassium 4.4.  Can discontinue potassium pills.  #Mildly elevated alkaline phosphatase-rest of the LFTs/bilirubin normal-monitor for now/history of IPMN.  Every 2023 PET scan negative.  #Peripheral neuropathy grade 1-secondary vincristine.  Monitor for now.stable  # chronic back pain-likely secondary to lymphoma impinging the neuroforaminal nerve- Improved; # Left knee pain/stiff [s/p Evaluation Ortho]- on volatren gel-stable  * [phone] [home]  # DISPOSITION: # port flush in 6 weeks # follow up in 3 months- MD; labs- cbc/cmo; port flush--Dr.B  # I reviewed the blood work- with the patient in detail; also reviewed the imaging independently [as summarized above]; and with the patient in detail.

## 2021-07-13 ENCOUNTER — Ambulatory Visit
Admission: RE | Admit: 2021-07-13 | Discharge: 2021-07-13 | Disposition: A | Discharge status: Home / Self Care | Payer: Medicare Other | Source: Ambulatory Visit | Attending: Internal Medicine | Admitting: Internal Medicine

## 2021-07-13 DIAGNOSIS — S22000A Wedge compression fracture of unspecified thoracic vertebra, initial encounter for closed fracture: Secondary | ICD-10-CM | POA: Diagnosis present

## 2021-07-13 DIAGNOSIS — M549 Dorsalgia, unspecified: Secondary | ICD-10-CM | POA: Diagnosis present

## 2021-07-17 ENCOUNTER — Telehealth: Payer: Self-pay | Admitting: Internal Medicine

## 2021-07-17 ENCOUNTER — Telehealth: Payer: Self-pay | Admitting: *Deleted

## 2021-07-17 NOTE — Telephone Encounter (Signed)
I left a voicemail for patient's primary care physician's nurse-with a callback number to discuss the patient's MRI findings.

## 2021-07-17 NOTE — Telephone Encounter (Signed)
Dr Tonette Bihari nurse Gregary Signs called, D Ouida Sills requesting Dr B look at the MRI patient had done showing compression fractures. He was going to send her for Kyphoplasty, but he is unsure if fractured is due to mets and would like Dr Sharmaine Base opinion ASAP. Gregary Signs left her desk number to call back [phone]  Narrative & Impression  CLINICAL DATA:  Mid to upper back pain for 6 weeks   EXAM: MRI THORACIC SPINE WITHOUT CONTRAST   TECHNIQUE: Multiplanar, multisequence MR imaging of the thoracic spine was performed. No intravenous contrast was administered.   COMPARISON:  None.   FINDINGS: Alignment:  Exaggerated thoracic kyphosis.   Vertebrae: Compression fractures of T5, T6, T7, T8, T9, T10, T11, T12, L1, L2, and L3. Extensive marrow edema associated with the T7 and T8 fractures with horizontal hypointense fracture planes. Mild edema associated with superior endplate fractures at T5, T6, T10, T11, L1, L2, and L3. Remote T12 compression fracture with greatest height loss and retropulsion. 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 Marrow edema associated with a cleft in the T7 spinous process tip.   Cord:  Normal signal and morphology.   Paraspinal and other soft tissues: Negative for perispinal mass or inflammation.   Disc levels:   Generalized disc desiccation and narrowing with bulging. Multilevel facet spurring most notable at the midthoracic levels. Diffusely patent spinal canal   IMPRESSION: 1. Compression fractures of T5 through L3, recent/acute appearing at T7 and T8 where there is extensive marrow edema. Height loss measures up to 60% at T8 ; there is no retropulsion. 2. Osteophyte/spinous process tip fracture at T7.  No listhesis. 3. Mild marrow edema is associated with multiple remote compression fractures, as above.     Electronically Signed   By: Jorje Guild M.D.   On: 07/15/2021 06:32

## 2021-07-18 ENCOUNTER — Telehealth: Payer: Self-pay | Admitting: Internal Medicine

## 2021-07-18 NOTE — Telephone Encounter (Signed)
I spoke Dr. Ouida Sills regarding the compression fractures-edema likely secondary to acute fractures.  Recent PET scan showed significant response to therapy-unlikely pathologic compression fractures.  As per Dr. Claiborne Billings will be referred to IR for kyphoplasty.

## 2021-07-18 NOTE — Telephone Encounter (Signed)
Dr. B left a voicemail for patient's PCP nurse-with a callback number to discuss the patient's MRI findings.

## 2021-07-19 ENCOUNTER — Other Ambulatory Visit: Payer: Self-pay | Admitting: Internal Medicine

## 2021-07-19 DIAGNOSIS — S22000A Wedge compression fracture of unspecified thoracic vertebra, initial encounter for closed fracture: Secondary | ICD-10-CM

## 2021-07-23 ENCOUNTER — Ambulatory Visit
Admission: RE | Admit: 2021-07-23 | Discharge: 2021-07-23 | Disposition: A | Discharge status: Home / Self Care | Payer: Medicare Other | Source: Ambulatory Visit | Attending: Internal Medicine | Admitting: Internal Medicine

## 2021-07-23 ENCOUNTER — Encounter: Payer: Self-pay | Admitting: Internal Medicine

## 2021-07-23 DIAGNOSIS — S22000A Wedge compression fracture of unspecified thoracic vertebra, initial encounter for closed fracture: Secondary | ICD-10-CM

## 2021-07-23 HISTORY — PX: IR RADIOLOGIST EVAL & MGMT: IMG5224

## 2021-07-23 NOTE — Consult Note (Signed)
Chief Complaint: Patient was seen in consultation today for thoracic compression fractures at the request of Anderson,Marshall W  Referring Physician(s): Anderson,Marshall W  History of Present Illness: Mary Washington is a 76 y.o. female with persistent significant back pain for the past several months dating back to December of 2022.  Her pain began after she had a partial  colectomy and colostomy creation due to diverticulitis.  Following the procedure she was given instructions to exercise and do some mild weight lifting.  She began experiencing back pain which seem to be worsened by these exercises.  Ultimately, she had an MRI of the thoracic spine which demonstrated multiple acute and subacute compression fractures including T5, T6, T7, T8, T9, T10, T11, L1, L2 and L3.  The majority of the fractures demonstrate very minimal compaction and marrow edema along the superior endplate.  However, the T7 and T8 compression fractures are more significant with diffuse marrow edema and well-defined fracture planes.  Additionally, there is a remote T12 fracture which was previously augmented with cement.  She is currently taking Tylenol 4 times daily for her pain.  She finds that she cannot stand for more than a few minutes at a time and has to frequently change her position while sitting and lying down in order to achieve relief.  She rates her pain currently at a 5 out of 10 on a 10 point scale.  Her [employer] disability questionnaire was a 4 out of 24.  Her primary complaint is that she is unable to stand for longer periods of time which means she is unable to cook and perform other functions that she was doing routinely prior to November.  Past Medical History:  Diagnosis Date   Colon polyps    Diverticulosis    Melanoma ([place]) 1988   lt upper arm, some lymph nodes removed   Mild intermittent asthma    Osteoporosis     Past Surgical History:  Procedure Laterality Date   BACK SURGERY      CATARACT EXTRACTION     IR IMAGING GUIDED PORT INSERTION  11/23/2020   PARTIAL COLECTOMY N/A 04/24/2021   Procedure: PARTIAL COLECTOMY;  Surgeon: Herbert Pun, MD;  Location: [facility] ORS;  Service: General;  Laterality: N/A;   TONSILLECTOMY     VAGINAL HYSTERECTOMY  1990    Allergies: Patient has no known allergies.  Medications: Prior to Admission medications   Medication Sig Start Date End Date Taking? Authorizing Provider  acetaminophen (TYLENOL) 500 MG tablet Take 500 mg by mouth every 6 (six) hours as needed.   Yes [provider]  Cholecalciferol (VITAMIN D) 50 MCG (2000 UT) CAPS Take 1 capsule by mouth daily.   Yes [provider]  Magnesium Oxide (MAG-OXIDE PO) Take 1 capsule by mouth daily at 12 noon.   Yes [provider]  meloxicam (MOBIC) 15 MG tablet Take 1 tablet by mouth daily. 04/05/21  Yes [provider]  metoprolol tartrate (LOPRESSOR) 25 MG tablet Take 25 mg by mouth in the morning and at bedtime. 05/28/21  Yes [provider]  Multiple Vitamins-Minerals (WOMENS MULTIVITAMIN PO) Take by mouth.   Yes [provider]  omeprazole (PRILOSEC) 20 MG capsule Take 20 mg by mouth 2 (two) times daily.   Yes [provider]  potassium chloride SA (KLOR-CON) 20 MEQ tablet TAKE 1 TABLET BY MOUTH TWICE DAILY Patient taking differently: 20 mEq once. 03/18/21  Yes [name], MD  traMADol (ULTRAM) 50 MG tablet  Take 1 tablet by mouth every 6 (six) hours as needed. 11/08/20  Yes [provider]  acyclovir (ZOVIRAX) 400 MG tablet Take 1 tablet (400 mg total) by mouth 2 (two) times daily. To prevent shingles Patient not taking: Reported on 07/08/2021 03/29/21   [name], MD  allopurinol (ZYLOPRIM) 300 MG tablet TAKE 1 TABLET(300 MG) BY MOUTH TWICE DAILY TO AVOID GOUT FLARE UP WHILE ON CHEMO Patient not taking: Reported on 05/20/2021 05/17/21   Verlon Au, NP  lidocaine-prilocaine (EMLA)  cream lidocaine-prilocaine 2.5 %-2.5 % topical cream  APPLY TOPICALLY TO THE AFFECTED AREA 1 TIME    [provider]  metoprolol tartrate (LOPRESSOR) 25 MG tablet Take 1 tablet (25 mg total) by mouth 2 (two) times daily. 05/03/21 06/02/21  Dahal, Marlowe Aschoff, MD  montelukast (SINGULAIR) 10 MG tablet TAKE 1 TABLET(10 MG) BY MOUTH AT BEDTIME. START 2 DAYS BEFORE INFUSION. TAKE IT FOR 4 DAYS Patient not taking: Reported on 07/08/2021 01/24/21   [name], MD  loratadine (CLARITIN) 10 MG tablet Take 1 tablet by mouth as needed.  11/23/20  [provider]     Family History  Problem Relation Age of Onset   Breast cancer Neg Hx     Social History   Socioeconomic History   Marital status: Married    Spouse name: Mikki Santee   Number of children: 2   Years of education: Not on file   Highest education level: Not on file  Occupational History   Not on file  Tobacco Use   Smoking status: Never   Smokeless tobacco: Never  Vaping Use   Vaping Use: Never used  Substance and Sexual Activity   Alcohol use: Never   Drug use: Never   Sexual activity: Yes    Partners: Male  Other Topics Concern   Not on file  Social History Narrative   ** Merged History Encounter **       Lives at home with husband.    Social Determinants of Health   Financial Resource Strain: Not on file  Food Insecurity: Not on file  Transportation Needs: Not on file  Physical Activity: Not on file  Stress: Not on file  Social Connections: Not on file    Review of Systems: A 12 point ROS discussed and pertinent positives are indicated in the HPI above.  All other systems are negative.  Review of Systems  Vital Signs: BP (!) 167/71 (BP Location: Right Arm, Patient Position: Sitting, Cuff Size: Small)    Pulse 74    Temp 98 F (36.7 C) (Oral)    SpO2 99%   Physical Exam Constitutional:      General: She is not in acute distress.    Appearance: Normal appearance.  HENT:     Head: Normocephalic  and atraumatic.  Eyes:     General: No scleral icterus. Cardiovascular:     Rate and Rhythm: Normal rate.  Pulmonary:     Effort: Pulmonary effort is normal.  Abdominal:     General: Abdomen is flat.     Palpations: Abdomen is soft.  Musculoskeletal:       Back:     Comments: Focal TTP at the T7 and T8 spinous processes  Skin:    General: Skin is warm and dry.  Neurological:     Mental Status: She is alert and oriented to person, place, and time.  Psychiatric:        Mood and Affect: Mood normal.  Behavior: Behavior normal.      Imaging: MR THORACIC SPINE WO CONTRAST  Result Date: 07/15/2021 CLINICAL DATA:  Mid to upper back pain for 6 weeks EXAM: MRI THORACIC SPINE WITHOUT CONTRAST TECHNIQUE: Multiplanar, multisequence MR imaging of the thoracic spine was performed. No intravenous contrast was administered. COMPARISON:  None. FINDINGS: Alignment:  Exaggerated thoracic kyphosis. Vertebrae: Compression fractures of T5, T6, T7, T8, T9, T10, T11, T12, L1, L2, and L3. Extensive marrow edema associated with the T7 and T8 fractures with horizontal hypointense fracture planes. Mild edema associated with superior endplate fractures at T5, T6, T10, T11, L1, L2, and L3. Remote T12 compression fracture with greatest height loss and retropulsion. 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 Marrow edema associated with a cleft in the T7 spinous process tip. Cord:  Normal signal and morphology. Paraspinal and other soft tissues: Negative for perispinal mass or inflammation. Disc levels: Generalized disc desiccation and narrowing with bulging. Multilevel facet spurring most notable at the midthoracic levels. Diffusely patent spinal canal IMPRESSION: 1. Compression fractures of T5 through L3, recent/acute appearing at T7 and T8 where there is extensive marrow edema. Height loss measures up to 60% at T8 ; there is no retropulsion. 2. Osteophyte/spinous  process tip fracture at T7.  No listhesis. 3. Mild marrow edema is associated with multiple remote compression fractures, as above. Electronically Signed   By: Jorje Guild M.D.   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 EXAM: NUCLEAR MEDICINE PET SKULL BASE TO THIGH TECHNIQUE: 5.9 mCi F-18 FDG was injected intravenously. Full-ring PET imaging was performed from the skull base to thigh after the radiotracer. CT data was obtained and used for attenuation correction and anatomic localization. Fasting blood glucose: 98 mg/dl COMPARISON:  PET-CT 01/14/2021.  Abdominopelvic CT 04/18/2021. FINDINGS: Mediastinal blood pool activity: SUV max 2.2 Liver activity: SUV max 2.9 NECK: No hypermetabolic cervical lymph nodes are identified.There are no lesions of the pharyngeal mucosal space. Brown fat activity is noted incidentally in the lower neck. Incidental CT findings: Chronic paranasal sinus opacification. CHEST: There are no hypermetabolic mediastinal, hilar or axillary lymph nodes. No hypermetabolic pulmonary activity or suspicious nodularity. Mild brown fat activity in the paraspinal regions. Incidental CT findings: Interval near complete clearing of the previously demonstrated patchy ground-glass opacities in both lungs. There is mild residual lower lobe atelectasis or scarring bilaterally without residual hypermetabolic activity. Right IJ Port-A-Cath extends to the upper right atrium. There are postsurgical changes in the left axilla. ABDOMEN/PELVIS: There is no hypermetabolic activity within the liver, adrenal glands, spleen or pancreas. There is no residual hypermetabolic nodal activity or enlarged lymph nodes within the abdomen or pelvis. The patient has undergone interval sigmoid colostomy and apparent partial distal small-bowel resection for previously demonstrated  diverticulitis. There is focal hypermetabolic activity within the mesenteric fat of the false pelvis (SUV max 5.7). This corresponds with an ill-defined soft tissue density measuring approximately 1.1 cm on image 201/3. This is favored to reflect inflammation or postsurgical change, although could reflect a treated lymph node. There is additional focal hypermetabolic activity more posteriorly in the left pelvis with an SUV max of 6.8, corresponding with a soft tissue nodule measuring 1.7 x 1.3 cm on image 206/3. Again, this is likely residual inflammation or postsurgical change as correlated with previous  imaging. No drainable fluid collections or focal inflammation identified. Incidental CT findings: As above, interval sigmoid colostomy and distal small bowel resection with probable residual inflammatory or postsurgical uptake in the pelvis. Moderate stool throughout the colon. Aortic and branch vessel atherosclerosis. SKELETON: There is no hypermetabolic activity to suggest osseous metastatic disease. Incidental CT findings: There are multiple thoracolumbar compression deformities which are new compared with the prior PET-CT. These are not associated with any hypermetabolic activity within the lumbar spine, although there are new fractures involving the superior endplate of L1 and L4 compared with the abdominal CT 04/15/2021. Superior endplate compression deformities at L2 and L3 are unchanged from that study. In the thoracic spine, patient has undergone spinal augmentation at T12 with associated chronic osseous retropulsion. There are multiple new thoracic compression deformities associated with mild hypermetabolic activity, most prominent at T7, T8, T9 and T10. IMPRESSION: 1. No residual hypermetabolic activity to suggest residual or recurrent lymphoma. [place] 1. 2. Interval postsurgical changes in the pelvis related to sigmoid diverticulitis demonstrated on CT 04/18/2021. There are 2 focal areas of  hypermetabolic activity within the pelvis which are probably due to residual inflammation or postsurgical change. No drainable fluid collections are identified. Attention on follow-up recommended. 3. Multiple thoracolumbar compression deformities, progressive from T7 through T10 with mild associated metabolic activity. 4. Interval resolution of previously demonstrated hypermetabolic pulmonary airspace opacities. 5.  Aortic Atherosclerosis (ICD10-I70.0). Electronically Signed   By: Richardean Sale M.D.   On: 07/05/2021 08:54    Labs:  CBC: Recent Labs    05/02/21 0515 05/03/21 0537 05/20/21 0932 07/08/21 1400  WBC 7.1 8.5 6.7 7.7  HGB 8.3* 8.7* 10.3* 12.4  HCT 25.5* 26.5* 32.3* 38.0  PLT 296 339 289 311    COAGS: Recent Labs    11/23/20 0757  INR 1.0    BMP: Recent Labs    05/01/21 0522 05/02/21 0515 05/20/21 0932 07/08/21 1400  NA 132* 130* 131* 127*  K 3.5 3.4* 3.9 4.4  CL 104 100 97* 93*  CO2 25 24 26 24   GLUCOSE 147* 141* 133* 131*  BUN 17 19 15 17   CALCIUM 8.2* 8.1* 9.1 9.2  CREATININE 0.38* 0.46 0.57 0.77  GFRNONAA >60 >60 >60 >60    LIVER FUNCTION TESTS: Recent Labs    04/25/21 0440 04/29/21 0608 05/02/21 0515 07/08/21 1400  BILITOT 0.3 0.2* 0.4 0.2*  AST 17 27 39 22  ALT 13 22 67* 19  ALKPHOS 63 85 81 164*  PROT 4.9* 5.4* 4.8* 6.1*  ALBUMIN 2.3* 2.1* 2.0* 3.8    TUMOR MARKERS: No results for input(s): AFPTM, CEA, CA199, CHROMGRNA in the last 8760 hours.  Assessment and Plan:  Very pleasant 76 year old female with numerous thoracic and lumbar compression fractures including T5, T6, T7, T8, T9, T10, T11, L1, L2 and L3.  Only the 2 most significant fractures at T7 and T8 appear to be significantly symptomatic at this time.  Unfortunately, her symptoms are quite significant and persistent despite 2 months of conservative therapy.  She continues to have a difficulty with standing for long periods of time and spends much of her day thinking about how she  will position herself and how to remain comfortable and minimize significant pain.  This is quite distressing and negatively impacting her quality of life.  She is an excellent candidate for percutaneous cement augmentation with balloon kyphoplasty at both T7 and T8.  I did explain to her and her husband in detail that given the numerous  other sites of more mild fracture, she may still have some persistent pain following the initial procedure.  Additionally, she is at high risk for possible progression of the fractures at these other sites.  We may need to perform additional augmentation at other levels in the future.  I was able to answer all of her and her husband's questions and explained the procedure in detail.  She is strongly motivated to pursue treatment of the T7 and T8 levels in hopes this will improve her pain enough to allow her to return to her normal lifestyle.  While she does have a history of lymphoma, she is currently in disease remission with no concerning findings on recent PET/CT.  Her fractures appear to be simple insufficiency fractures.  1.)  Please schedule for T7 and T8 cement augmentation with balloon kyphoplasty to be performed at [facility] as soon as possible.  If possible, she would like for me to perform the procedure.  She understands that due to scheduling conflicts is possible that one of my partners may be the one to do her procedure.   Thank you for this interesting consult.  I greatly enjoyed meeting Mary Washington and look forward to participating in their care.  A copy of this report was sent to the requesting provider on this date.  Electronically Signed: Criselda Peaches 07/23/2021, 12:26 PM   I spent a total of  40 Minutes  in face to face in clinical consultation, greater than 50% of which was counseling/coordinating care for thoracic compression fractures.

## 2021-07-24 ENCOUNTER — Other Ambulatory Visit: Payer: Self-pay | Admitting: Interventional Radiology

## 2021-07-24 DIAGNOSIS — S22060A Wedge compression fracture of T7-T8 vertebra, initial encounter for closed fracture: Secondary | ICD-10-CM

## 2021-07-29 NOTE — Progress Notes (Signed)
Patient on schedule for Kyphoplasty 07/31/2021, called and spoke with husband on phone with pre procedure instructions given. Made aware to be here at 0730, NPO after MN prior to procedure as well as driver post procedure/recovery/discharge. Stated understanding.

## 2021-07-30 ENCOUNTER — Other Ambulatory Visit: Payer: Self-pay | Admitting: Radiology

## 2021-07-31 ENCOUNTER — Ambulatory Visit
Admission: RE | Admit: 2021-07-31 | Discharge: 2021-07-31 | Disposition: A | Discharge status: Home / Self Care | Payer: Medicare Other | Source: Ambulatory Visit | Attending: Interventional Radiology | Admitting: Interventional Radiology

## 2021-08-09 ENCOUNTER — Other Ambulatory Visit: Payer: Self-pay | Admitting: Radiology

## 2021-08-12 ENCOUNTER — Encounter: Payer: Self-pay | Admitting: Radiology

## 2021-08-12 ENCOUNTER — Ambulatory Visit
Admission: RE | Admit: 2021-08-12 | Discharge: 2021-08-12 | Disposition: A | Discharge status: Home / Self Care | Payer: Medicare Other | Source: Ambulatory Visit | Attending: Interventional Radiology | Admitting: Interventional Radiology

## 2021-08-12 DIAGNOSIS — M4854XA Collapsed vertebra, not elsewhere classified, thoracic region, initial encounter for fracture: Secondary | ICD-10-CM | POA: Insufficient documentation

## 2021-08-12 DIAGNOSIS — M8088XA Other osteoporosis with current pathological fracture, vertebra(e), initial encounter for fracture: Secondary | ICD-10-CM | POA: Diagnosis not present

## 2021-08-12 DIAGNOSIS — M546 Pain in thoracic spine: Secondary | ICD-10-CM | POA: Insufficient documentation

## 2021-08-12 DIAGNOSIS — S22060A Wedge compression fracture of T7-T8 vertebra, initial encounter for closed fracture: Secondary | ICD-10-CM

## 2021-08-12 DIAGNOSIS — M3119 Other thrombotic microangiopathy: Secondary | ICD-10-CM | POA: Insufficient documentation

## 2021-08-12 HISTORY — PX: IR KYPHO THORACIC WITH BONE BIOPSY: IMG5518

## 2021-08-12 LAB — CBC
HCT: 45.7 % (ref 36.0–46.0)
Hemoglobin: 14.4 g/dL (ref 12.0–15.0)
MCH: 26.4 pg (ref 26.0–34.0)
MCHC: 31.5 g/dL (ref 30.0–36.0)
MCV: 83.7 fL (ref 80.0–100.0)
Platelets: 308 10*3/uL (ref 150–400)
RBC: 5.46 MIL/uL — ABNORMAL HIGH (ref 3.87–5.11)
RDW: 14.2 % (ref 11.5–15.5)
WBC: 6.2 10*3/uL (ref 4.0–10.5)
nRBC: 0 % (ref 0.0–0.2)

## 2021-08-12 LAB — PROTIME-INR
INR: 0.9 (ref 0.8–1.2)
Prothrombin Time: 12.4 seconds (ref 11.4–15.2)

## 2021-08-12 MED ORDER — CEFAZOLIN SODIUM-DEXTROSE 2-4 GM/100ML-% IV SOLN: 2.0000 g | INTRAVENOUS | Status: DC

## 2021-08-12 MED ORDER — MIDAZOLAM HCL 2 MG/2ML IJ SOLN
INTRAMUSCULAR | Status: AC
Start: 2021-08-12 — End: 2021-08-12
  Filled 2021-08-12: qty 2

## 2021-08-12 MED ORDER — MIDAZOLAM HCL 2 MG/2ML IJ SOLN
INTRAMUSCULAR | Status: AC
  Filled 2021-08-12: qty 2

## 2021-08-12 MED ORDER — LIDOCAINE-EPINEPHRINE 1 %-1:100000 IJ SOLN
INTRAMUSCULAR | Status: AC
  Administered 2021-08-12: 15 mL
  Filled 2021-08-12: qty 1

## 2021-08-12 MED ORDER — LIDOCAINE HCL (PF) 1 % IJ SOLN
INTRAMUSCULAR | Status: AC
Start: 2021-08-12 — End: 2021-08-12
  Administered 2021-08-12: 15 mL
  Filled 2021-08-12: qty 30

## 2021-08-12 MED ORDER — SODIUM CHLORIDE FLUSH 0.9 % IV SOLN
INTRAVENOUS | Status: AC
  Filled 2021-08-12: qty 10

## 2021-08-12 MED ORDER — MIDAZOLAM HCL 5 MG/5ML IJ SOLN
INTRAMUSCULAR | Status: DC | PRN
  Administered 2021-08-12: 1 mg via INTRAVENOUS
  Administered 2021-08-12 (×2): .5 mg via INTRAVENOUS
  Administered 2021-08-12: 1 mg via INTRAVENOUS

## 2021-08-12 MED ORDER — FENTANYL CITRATE (PF) 100 MCG/2ML IJ SOLN
INTRAMUSCULAR | Status: AC
  Filled 2021-08-12: qty 2

## 2021-08-12 MED ORDER — SODIUM CHLORIDE 0.9 % IV SOLN
INTRAVENOUS | Status: DC
  Filled 2021-08-12: qty 1000

## 2021-08-12 MED ORDER — CEFAZOLIN SODIUM-DEXTROSE 1-4 GM/50ML-% IV SOLN
INTRAVENOUS | Status: DC | PRN
  Administered 2021-08-12: 2 g via INTRAVENOUS

## 2021-08-12 MED ORDER — FENTANYL CITRATE (PF) 100 MCG/2ML IJ SOLN
INTRAMUSCULAR | Status: DC | PRN
  Administered 2021-08-12 (×2): 25 ug via INTRAVENOUS
  Administered 2021-08-12: 50 ug via INTRAVENOUS

## 2021-08-12 MED ORDER — HEPARIN SOD (PORK) LOCK FLUSH 100 UNIT/ML IV SOLN
INTRAVENOUS | Status: AC
  Filled 2021-08-12: qty 5

## 2021-08-12 NOTE — Procedures (Signed)
Interventional Radiology Procedure Note ? ?Date of Procedure: 08/12/2021  ?Procedure: T7 + T8 KP ? ?Findings:  ?1. Successful T7 + T8 kyphoplasty   ? ?Complications: No immediate complications noted.  ? ?Estimated Blood Loss: minimal ? ?Follow-up and Recommendations: ?1. Bedrest 2 hours ? ? ?Albin Felling, MD  ?Vascular & Interventional Radiology  ?08/12/2021 11:06 AM ? ? ? ?

## 2021-08-12 NOTE — Progress Notes (Signed)
Patient clinically stable post T 7/8 Kyphoplasty, tolerated well. Versed 3 mg along with Fentanyl 100 mcg IV given for procedure. Vitals stable pre and post procedure. Flat until 1120, bedrest until 1220 prior to discharge. Dressing to posterior thoracic dry/intact. ?

## 2021-08-12 NOTE — H&P (Signed)
Chief Complaint: Patient was seen in consultation today for T7 and T8 compression fractures at the request of McCullough,Heath K  Referring Physician(s): Criselda Peaches  Supervising Physician: Juliet Rude  Patient Status: [facility] - Out-pt  History of Present Illness: Mary Washington is a 76 y.o. female with symptomatic thoracic compressions fractures who was seen in our IR clinic in full consultation on 2/21 and deemed a candidate for thoracic level 7 and 8 KP with moderate sedation.   The patient has had a H&P performed within the last 30 days, all history, medications, and exam have been reviewed. The patient denies any interval changes since the H&P.  The patient denies any current chest pain or shortness of breath. She denies any current blood thinner use, denies any known bleeding or clotting disorder. The patient denies any fever or chills. She has no known complications to sedation. She currently rates her mid thoracic back pain 2/10 but states this is worse with movement and she has not moved much this morning. She denies any new neurological changes and denies any new loss of bowel or bladder function.    Past Medical History:  Diagnosis Date   Colon polyps    Diverticulosis    Melanoma ([place]) 1988   lt upper arm, some lymph nodes removed   Mild intermittent asthma    Osteoporosis     Past Surgical History:  Procedure Laterality Date   BACK SURGERY     CATARACT EXTRACTION     IR IMAGING GUIDED PORT INSERTION  11/23/2020   IR RADIOLOGIST EVAL & MGMT  07/23/2021   PARTIAL COLECTOMY N/A 04/24/2021   Procedure: PARTIAL COLECTOMY;  Surgeon: Herbert Pun, MD;  Location: [facility] ORS;  Service: General;  Laterality: N/A;   TONSILLECTOMY     VAGINAL HYSTERECTOMY  1990    Allergies: Patient has no known allergies.  Medications: Prior to Admission medications   Medication Sig Start Date End Date Taking? Authorizing Provider  acetaminophen (TYLENOL) 500 MG  tablet Take 500 mg by mouth every 6 (six) hours as needed.   Yes [provider]  Cholecalciferol (VITAMIN D) 50 MCG (2000 UT) CAPS Take 1 capsule by mouth daily.   Yes [provider]  Magnesium Oxide (MAG-OXIDE PO) Take 1 capsule by mouth daily at 12 noon.   Yes [provider]  meloxicam (MOBIC) 15 MG tablet Take 1 tablet by mouth daily. 04/05/21  Yes [provider]  metoprolol tartrate (LOPRESSOR) 25 MG tablet Take 25 mg by mouth in the morning and at bedtime. 05/28/21  Yes [provider]  Multiple Vitamins-Minerals (WOMENS MULTIVITAMIN PO) Take by mouth.   Yes [provider]  omeprazole (PRILOSEC) 20 MG capsule Take 20 mg by mouth 2 (two) times daily.   Yes [provider]  potassium chloride SA (KLOR-CON) 20 MEQ tablet TAKE 1 TABLET BY MOUTH TWICE DAILY Patient taking differently: 20 mEq once. 03/18/21  Yes [name], MD  acyclovir (ZOVIRAX) 400 MG tablet Take 1 tablet (400 mg total) by mouth 2 (two) times daily. To prevent shingles Patient not taking: Reported on 07/08/2021 03/29/21   [name], MD  allopurinol (ZYLOPRIM) 300 MG tablet TAKE 1 TABLET(300 MG) BY MOUTH TWICE DAILY TO AVOID GOUT FLARE UP WHILE ON CHEMO Patient not taking: Reported on 05/20/2021 05/17/21   Verlon Au, NP  lidocaine-prilocaine (EMLA) cream lidocaine-prilocaine 2.5 %-2.5 % topical cream  APPLY TOPICALLY TO THE AFFECTED AREA 1 TIME    [provider]  metoprolol tartrate (LOPRESSOR) 25 MG tablet Take 1 tablet (25 mg total) by mouth 2 (two) times daily. 05/03/21 06/02/21  Dahal, Marlowe Aschoff, MD  montelukast (SINGULAIR) 10 MG tablet TAKE 1 TABLET(10 MG) BY MOUTH AT BEDTIME. START 2 DAYS BEFORE INFUSION. TAKE IT FOR 4 DAYS Patient not taking: Reported on 07/08/2021 01/24/21   [name], MD  traMADol (ULTRAM) 50 MG tablet Take 1 tablet by mouth every 6 (six) hours as needed. 11/08/20   [provider]   loratadine (CLARITIN) 10 MG tablet Take 1 tablet by mouth as needed.  11/23/20  [provider]     Family History  Problem Relation Age of Onset   Breast cancer Neg Hx     Social History   Socioeconomic History   Marital status: Married    Spouse name: Mikki Santee   Number of children: 2   Years of education: Not on file   Highest education level: Not on file  Occupational History   Not on file  Tobacco Use   Smoking status: Never   Smokeless tobacco: Never  Vaping Use   Vaping Use: Never used  Substance and Sexual Activity   Alcohol use: Never   Drug use: Never   Sexual activity: Yes    Partners: Male  Other Topics Concern   Not on file  Social History Narrative   ** Merged History Encounter **       Lives at home with husband.    Social Determinants of Health   Financial Resource Strain: Not on file  Food Insecurity: Not on file  Transportation Needs: Not on file  Physical Activity: Not on file  Stress: Not on file  Social Connections: Not on file   Review of Systems: A 12 point ROS discussed and pertinent positives are indicated in the HPI above.  All other systems are negative.  Review of Systems  Vital Signs: BP (!) 156/69 (BP Location: Right Arm)    Pulse 90    Temp 98.4 F (36.9 C) (Oral)    Resp 13    Ht '5\' 2"'$  (1.575 m)    Wt 110 lb (49.9 kg)    SpO2 100%    BMI 20.12 kg/m   Physical Exam Constitutional:      Appearance: Normal appearance.  Cardiovascular:     Rate and Rhythm: Normal rate and regular rhythm.  Pulmonary:     Effort: Pulmonary effort is normal. No respiratory distress.  Musculoskeletal:        General: Tenderness present.     Comments: TTP over thoracic 7 region  Skin:    General: Skin is warm and dry.  Neurological:     Mental Status: She is alert and oriented to person, place, and time.    Imaging: MR THORACIC SPINE WO CONTRAST  Result Date: 07/15/2021 CLINICAL DATA:  Mid to upper back pain for 6 weeks EXAM: MRI  THORACIC SPINE WITHOUT CONTRAST TECHNIQUE: Multiplanar, multisequence MR imaging of the thoracic spine was performed. No intravenous contrast was administered. COMPARISON:  None. FINDINGS: Alignment:  Exaggerated thoracic kyphosis. Vertebrae: Compression fractures of T5, T6, T7, T8, T9, T10, T11, T12, L1, L2, and L3. Extensive marrow edema associated with the T7 and T8 fractures with horizontal hypointense fracture planes. Mild edema associated with superior endplate fractures at T5, T6, T10, T11, L1, L2, and L3. Remote T12 compression fracture with greatest height loss and retropulsion. There has been cement augmentation at this level and there is T11-12  ankylosis by prior CT. Marrow edema associated with a cleft in the T7 spinous process tip. Cord:  Normal signal and morphology. Paraspinal and other soft tissues: Negative for perispinal mass or inflammation. Disc levels: Generalized disc desiccation and narrowing with bulging. Multilevel facet spurring most notable at the midthoracic levels. Diffusely patent spinal canal IMPRESSION: 1. Compression fractures of T5 through L3, recent/acute appearing at T7 and T8 where there is extensive marrow edema. Height loss measures up to 60% at T8 ; there is no retropulsion. 2. Osteophyte/spinous process tip fracture at T7.  No listhesis. 3. Mild marrow edema is associated with multiple remote compression fractures, as above. Electronically Signed   By: Jorje Guild M.D.   On: 07/15/2021 06:32   IR Radiologist Eval & Mgmt  Result Date: 07/23/2021 EXAM: NEW PATIENT OFFICE VISIT CHIEF COMPLAINT: SEE NOTE IN EPIC HISTORY OF PRESENT ILLNESS: SEE NOTE IN EPIC REVIEW OF SYSTEMS: SEE NOTE IN EPIC PHYSICAL EXAMINATION: SEE NOTE IN EPIC ASSESSMENT AND PLAN: SEE NOTE IN EPIC Electronically Signed   By: Jacqulynn Cadet M.D.   On: 07/23/2021 16:14    Labs:  CBC: Recent Labs    05/03/21 0537 05/20/21 0932 07/08/21 1400 08/12/21 0811  WBC 8.5 6.7 7.7 6.2  HGB 8.7* 10.3*  12.4 14.4  HCT 26.5* 32.3* 38.0 45.7  PLT 339 289 311 308    COAGS: Recent Labs    11/23/20 0757 08/12/21 0811  INR 1.0 0.9    BMP: Recent Labs    05/01/21 0522 05/02/21 0515 05/20/21 0932 07/08/21 1400  NA 132* 130* 131* 127*  K 3.5 3.4* 3.9 4.4  CL 104 100 97* 93*  CO2 '25 24 26 24  '$ GLUCOSE 147* 141* 133* 131*  BUN '17 19 15 17  '$ CALCIUM 8.2* 8.1* 9.1 9.2  CREATININE 0.38* 0.46 0.57 0.77  GFRNONAA >60 >60 >60 >60    LIVER FUNCTION TESTS: Recent Labs    04/25/21 0440 04/29/21 0608 05/02/21 0515 07/08/21 1400  BILITOT 0.3 0.2* 0.4 0.2*  AST 17 27 39 22  ALT 13 22 67* 19  ALKPHOS 63 85 81 164*  PROT 4.9* 5.4* 4.8* 6.1*  ALBUMIN 2.3* 2.1* 2.0* 3.8    Assessment and Plan: This is a 76 year old female with symptomatic thoracic compressions fractures who was seen in our IR clinic in full consultation on 2/21 and deemed a candidate for thoracic level 7 and 8 KP with moderate sedation. The patient denies any pertinent medical changes since this visit.   She is here today for her scheduled procedure. The patient has been NPO, no blood thinners taken, consult from 2/21, imaging, labs and vitals have been reviewed.  Risks and benefits of image guided thoracic level 7 and thoracic level 8 kyphoplasty with moderate sedation were discussed with the patient including, but not limited to education regarding the natural healing process of compression fractures without intervention, bleeding, infection, cement migration and incomplete pain relief.  All of the patient's questions were answered, patient is agreeable to proceed.  Consent signed and in chart.   Thank you for this interesting consult.  I greatly enjoyed meeting [employer] and look forward to participating in their care.  A copy of this report was sent to the requesting provider on this date.  Electronically Signed: Hedy Jacob, PA-C 08/12/2021, 9:10 AM   I spent a total of 10 Minutes in face to face in  clinical consultation, greater than 50% of which was counseling/coordinating care for symptomatic  thoracic compression fractures.

## 2021-08-16 ENCOUNTER — Other Ambulatory Visit: Payer: Self-pay | Admitting: Interventional Radiology

## 2021-08-16 DIAGNOSIS — S22000A Wedge compression fracture of unspecified thoracic vertebra, initial encounter for closed fracture: Secondary | ICD-10-CM

## 2021-08-19 ENCOUNTER — Inpatient Hospital Stay: Payer: Medicare Other

## 2021-09-11 ENCOUNTER — Other Ambulatory Visit: Payer: Medicare Other

## 2021-09-11 ENCOUNTER — Ambulatory Visit
Admission: RE | Admit: 2021-09-11 | Discharge: 2021-09-11 | Disposition: A | Discharge status: Home / Self Care | Payer: Medicare Other | Source: Ambulatory Visit | Attending: Interventional Radiology | Admitting: Interventional Radiology

## 2021-09-11 DIAGNOSIS — S22000A Wedge compression fracture of unspecified thoracic vertebra, initial encounter for closed fracture: Secondary | ICD-10-CM

## 2021-09-11 HISTORY — PX: IR RADIOLOGIST EVAL & MGMT: IMG5224

## 2021-09-11 NOTE — Progress Notes (Signed)
Interventional Radiology - Telephone Visit  ? ? ?History of Present Illness  ?Mary Washington is a 76 y.o. female with a relevant history of multiple compression fractures with most recent acuity at T7 and T8, seen in telephone visit for routine post KP follow up.  We confirmed identity with 2 personal identifiers.  ? ?The patient is status post T7 and T8 kyphoplasty on August 12, 2021.  She is approximately 1 month status post procedure.  Overall, she states that her back pain is better.  She mainly experiences back pain when performing certain activities of daily life, such as brushing her teeth or pulling a short over her head, and rates it at approximately 3/10.  The back pain resolves relatively quickly, and she experiences little to no back pain when sitting or lying down.  Previously, or back pain was 5/10.  She takes Tylenol intermittently for pain relief.  She denies any radiculopathy.  ? ? ? ?Past medical and surgical history reviewed. No interval changes. No interval hospitalizations. ? ? ?Medications  ?I have reviewed the current medication list. Refer to chart for details. ?Current Outpatient Medications  ?Medication Instructions  ? acetaminophen (TYLENOL) 500 mg, Oral, Every 6 hours PRN  ? acyclovir (ZOVIRAX) 400 mg, Oral, 2 times daily, To prevent shingles  ? allopurinol (ZYLOPRIM) 300 MG tablet TAKE 1 TABLET(300 MG) BY MOUTH TWICE DAILY TO AVOID GOUT FLARE UP WHILE ON CHEMO  ? Cholecalciferol (VITAMIN D) 50 MCG (2000 UT) CAPS 1 capsule, Oral, Daily  ? lidocaine-prilocaine (EMLA) cream lidocaine-prilocaine 2.5 %-2.5 % topical cream ? APPLY TOPICALLY TO THE AFFECTED AREA 1 TIME  ? Magnesium Oxide (MAG-OXIDE PO) 1 capsule, Oral, Daily  ? meloxicam (MOBIC) 15 MG tablet 1 tablet, Oral, Daily  ? metoprolol tartrate (LOPRESSOR) 25 mg, Oral, 2 times daily  ? metoprolol tartrate (LOPRESSOR) 25 mg, Oral, 2 times daily  ? montelukast (SINGULAIR) 10 MG tablet TAKE 1 TABLET(10 MG) BY MOUTH AT BEDTIME. START 2 DAYS  BEFORE INFUSION. TAKE IT FOR 4 DAYS  ? Multiple Vitamins-Minerals (WOMENS MULTIVITAMIN PO) Oral  ? omeprazole (PRILOSEC) 20 mg, Oral, 2 times daily  ? potassium chloride SA (KLOR-CON) 20 MEQ tablet TAKE 1 TABLET BY MOUTH TWICE DAILY  ? traMADol (ULTRAM) 50 MG tablet 1 tablet, Oral, Every 6 hours PRN  ?  ? ? ? ?Pertinent Lab Results ? ?  Latest Ref Rng & Units 08/12/2021  ?  8:11 AM 07/08/2021  ?  2:00 PM 05/20/2021  ?  9:32 AM  ?CBC  ?WBC 4.0 - 10.5 K/uL 6.2   7.7   6.7    ?Hemoglobin 12.0 - 15.0 g/dL 14.4   12.4   10.3    ?Hematocrit 36.0 - 46.0 % 45.7   38.0   32.3    ?Platelets 150 - 400 K/uL 308   311   289    ? ? ?  Latest Ref Rng & Units 07/08/2021  ?  2:00 PM 05/20/2021  ?  9:32 AM 05/02/2021  ?  5:15 AM  ?CMP  ?Glucose 70 - 99 mg/dL 131   133   141    ?BUN 8 - 23 mg/dL '17   15   19    '$ ?Creatinine 0.44 - 1.00 mg/dL 0.77   0.57   0.46    ?Sodium 135 - 145 mmol/L 127   131   130    ?Potassium 3.5 - 5.1 mmol/L 4.4   3.9   3.4    ?Chloride 98 -  111 mmol/L 93   97   100    ?CO2 22 - 32 mmol/L '24   26   24    '$ ?Calcium 8.9 - 10.3 mg/dL 9.2   9.1   8.1    ?Total Protein 6.5 - 8.1 g/dL 6.1    4.8    ?Total Bilirubin 0.3 - 1.2 mg/dL 0.2    0.4    ?Alkaline Phos 38 - 126 U/L 164    81    ?AST 15 - 41 U/L 22    39    ?ALT 0 - 44 U/L 19    67    ? ? ? ?Relevant and/or Recent Imaging: ?None  ? ? ?Assessment & Plan ?Mary Washington is a 76 y.o. female with a relevant history of T7 and T8 compression fractures s/p T7 and T8 kyphoplasty, seen in telephone visit for routine post precedure follow up.   ? ?Overall she is doing well with modest improvement in her back pain. I advised her it was safe to resume light exercise and physical therapy to strengthen her back muscles, which will hopefully further help with her back pain given known multilevel fractures and the biomechanical stresses on her spine as a result.  ? ? ?Plan:  ?1. RTC as needed. Referral to physical therapy from PCP.   ? ? ? ?I spent a total of  10 Minutes in  face-to-face in clinical consultation, greater than 50% of which was spent on medical decision-making and counseling/coordinating care for post procedure care after T7 and T8 Kyphoplasty.  ? ?Visit type: Audio only (telephone). Audio (no video) only due to patient's lack of internet/smartphone capability. ?Alternative for in-person consultation at [facility], [address], [place], [place]. ?This visit type was conducted due to national recommendations for restrictions regarding the COVID-19 Pandemic (e.g. social distancing).  This format is felt to be most appropriate for this patient at this time.  All issues noted in this document were discussed and addressed.   ? ? ? ?Albin Felling, MD  ?Vascular and Interventional Radiology ?09/11/2021 11:01 AM  ? ? ? ? ?

## 2021-09-12 ENCOUNTER — Ambulatory Visit
Admission: RE | Admit: 2021-09-12 | Discharge: 2021-09-12 | Disposition: A | Discharge status: Home / Self Care | Payer: Medicare Other | Attending: Surgery | Admitting: Surgery

## 2021-09-12 ENCOUNTER — Encounter
Admission: RE | Disposition: A | Discharge status: Home / Self Care | Payer: Self-pay | Source: Home / Self Care | Attending: Surgery

## 2021-09-12 ENCOUNTER — Ambulatory Visit: Payer: Medicare Other | Admitting: Anesthesiology

## 2021-09-12 ENCOUNTER — Other Ambulatory Visit: Payer: Medicare Other | Admitting: Radiology

## 2021-09-12 DIAGNOSIS — Z933 Colostomy status: Secondary | ICD-10-CM | POA: Diagnosis not present

## 2021-09-12 DIAGNOSIS — J4520 Mild intermittent asthma, uncomplicated: Secondary | ICD-10-CM | POA: Diagnosis not present

## 2021-09-12 DIAGNOSIS — Z09 Encounter for follow-up examination after completed treatment for conditions other than malignant neoplasm: Secondary | ICD-10-CM | POA: Insufficient documentation

## 2021-09-12 DIAGNOSIS — K5730 Diverticulosis of large intestine without perforation or abscess without bleeding: Secondary | ICD-10-CM | POA: Insufficient documentation

## 2021-09-12 DIAGNOSIS — K219 Gastro-esophageal reflux disease without esophagitis: Secondary | ICD-10-CM | POA: Insufficient documentation

## 2021-09-12 DIAGNOSIS — Z79899 Other long term (current) drug therapy: Secondary | ICD-10-CM | POA: Insufficient documentation

## 2021-09-12 DIAGNOSIS — Z9049 Acquired absence of other specified parts of digestive tract: Secondary | ICD-10-CM | POA: Diagnosis not present

## 2021-09-12 HISTORY — PX: COLONOSCOPY WITH PROPOFOL: SHX5780

## 2021-09-12 SURGERY — COLONOSCOPY WITH PROPOFOL
Anesthesia: General

## 2021-09-12 MED ORDER — PROPOFOL 10 MG/ML IV BOLUS
INTRAVENOUS | Status: DC | PRN
Start: 2021-09-12 — End: 2021-09-12
  Administered 2021-09-12 (×2): 20 mg via INTRAVENOUS
  Administered 2021-09-12 (×2): 10 mg via INTRAVENOUS
  Administered 2021-09-12: 50 mg via INTRAVENOUS
  Administered 2021-09-12: 30 mg via INTRAVENOUS
  Administered 2021-09-12: 10 mg via INTRAVENOUS
  Administered 2021-09-12: 30 mg via INTRAVENOUS
  Administered 2021-09-12: 10 mg via INTRAVENOUS

## 2021-09-12 MED ORDER — LIDOCAINE HCL (PF) 1 % IJ SOLN
INTRAMUSCULAR | Status: AC
  Filled 2021-09-12: qty 2

## 2021-09-12 MED ORDER — SODIUM CHLORIDE 0.9 % IV SOLN
INTRAVENOUS | Status: DC
  Administered 2021-09-12: 1000 mL via INTRAVENOUS

## 2021-09-12 MED ORDER — PROPOFOL 500 MG/50ML IV EMUL
INTRAVENOUS | Status: AC
  Filled 2021-09-12: qty 50

## 2021-09-12 NOTE — Anesthesia Postprocedure Evaluation (Signed)
Anesthesia Post Note ? ?Patient: Arisbeth Purrington ? ?Procedure(s) Performed: COLONOSCOPY WITH PROPOFOL ? ?Patient location during evaluation: PACU ?Anesthesia Type: General ?Level of consciousness: awake and alert, oriented and patient cooperative ?Pain management: pain level controlled ?Vital Signs Assessment: post-procedure vital signs reviewed and stable ?Respiratory status: spontaneous breathing, nonlabored ventilation and respiratory function stable ?Cardiovascular status: blood pressure returned to baseline and stable ?Postop Assessment: adequate PO intake ?Anesthetic complications: no ? ? ?No notable events documented. ? ? ?Last Vitals:  ?Vitals:  ? 09/12/21 1303 09/12/21 1313  ?BP: (!) 109/59 127/62  ?Pulse: 79 78  ?Resp: (!) 21 19  ?Temp:  (!) 35.6 ?C  ?SpO2: 100% 100%  ?  ?Last Pain:  ?Vitals:  ? 09/12/21 1313  ?TempSrc: Temporal  ?PainSc: 0-No pain  ? ? ?  ?  ?  ?  ?  ?  ? ?Darrin Nipper ? ? ? ? ?

## 2021-09-12 NOTE — Anesthesia Preprocedure Evaluation (Signed)
Anesthesia Evaluation  ?Patient identified by MRN, date of birth, ID band ?Patient awake ? ? ? ?Reviewed: ?Allergy & Precautions, H&P , NPO status , Patient's Chart, lab work & pertinent test results, reviewed documented beta blocker date and time  ? ?Airway ?Mallampati: II ? ? ?Neck ROM: full ? ? ? Dental ? ?(+) Poor Dentition ?  ?Pulmonary ?neg shortness of breath, asthma ,  ?  ?Pulmonary exam normal ? ? ? ? ? ? ? Cardiovascular ?Exercise Tolerance: Poor ?negative cardio ROS ?Normal cardiovascular exam ?Rhythm:regular Rate:Normal ? ? ?  ?Neuro/Psych ?negative neurological ROS ? negative psych ROS  ? GI/Hepatic ?negative GI ROS, Neg liver ROS,   ?Endo/Other  ?negative endocrine ROS ? Renal/GU ?negative Renal ROS  ?negative genitourinary ?  ?Musculoskeletal ? ? Abdominal ?  ?Peds ? Hematology ? ?(+) Blood dyscrasia, anemia ,   ?Anesthesia Other Findings ?Past Medical History: ?No date: Colon polyps ?No date: Diverticulosis ?1988: Melanoma ([place]) ?    Comment:  lt upper arm, some lymph nodes removed ?No date: Mild intermittent asthma ?No date: Osteoporosis ?Past Surgical History: ?No date: BACK SURGERY ?No date: CATARACT EXTRACTION ?11/23/2020: IR IMAGING GUIDED PORT INSERTION ?08/12/2021: IR KYPHO THORACIC WITH BONE BIOPSY ?07/23/2021: IR RADIOLOGIST EVAL & MGMT ?09/11/2021: IR RADIOLOGIST EVAL & MGMT ?04/24/2021: PARTIAL COLECTOMY; N/A ?    Comment:  Procedure: PARTIAL COLECTOMY;  Surgeon: Windell Moment,  ?             Reeves Forth, MD;  Location: [facility] ORS;  Service: General;   ?             Laterality: N/A; ?No date: TONSILLECTOMY ?1990: VAGINAL HYSTERECTOMY ? ? Reproductive/Obstetrics ?negative OB ROS ? ?  ? ? ? ? ? ? ? ? ? ? ? ? ? ?  ?  ? ? ? ? ? ? ? ? ?Anesthesia Physical ?Anesthesia Plan ? ?ASA: 3 ? ?Anesthesia Plan: General  ? ?Post-op Pain Management:   ? ?Induction:  ? ?PONV Risk Score and Plan:  ? ?Airway Management Planned:  ? ?Additional Equipment:  ? ?Intra-op Plan:   ? ?Post-operative Plan:  ? ?Informed Consent: I have reviewed the patients History and Physical, chart, labs and discussed the procedure including the risks, benefits and alternatives for the proposed anesthesia with the patient or authorized representative who has indicated his/her understanding and acceptance.  ? ? ? ?Dental Advisory Given ? ?Plan Discussed with: CRNA ? ?Anesthesia Plan Comments:   ? ? ? ? ? ? ?Anesthesia Quick Evaluation ? ?

## 2021-09-12 NOTE — Transfer of Care (Signed)
Immediate Anesthesia Transfer of Care Note ? ?Patient: Mary Washington ? ?Procedure(s) Performed: COLONOSCOPY WITH PROPOFOL ? ?Patient Location: PACU and Endoscopy Unit ? ?Anesthesia Type:General ? ?Level of Consciousness: drowsy and patient cooperative ? ?Airway & Oxygen Therapy: Patient Spontanous Breathing ? ?Post-op Assessment: Report given to RN and Post -op Vital signs reviewed and stable ? ?Post vital signs: Reviewed and stable ? ?Last Vitals:  ?Vitals Value Taken Time  ?BP 109/59 09/12/21 1303  ?Temp    ?Pulse 79 09/12/21 1303  ?Resp 21 09/12/21 1303  ?SpO2 100 % 09/12/21 1303  ? ? ?Last Pain:  ?Vitals:  ? 09/12/21 1303  ?TempSrc:   ?PainSc: 0-No pain  ?   ? ?  ? ?Complications: No notable events documented. ?

## 2021-09-12 NOTE — H&P (Signed)
HISTORY OF PRESENT ILLNESS:  ? ?Mary Washington is a 76 y.o.female patient who comes for follow up  ? ?Patient endorses feeling a little bit better. She is still having some back pain. She denies any abdominal pain. No issues with the colostomy. She is having better colostomy output. She denies any issues with the wound. She is tolerating diet. She has been getting some physical therapy. ? ? ?PAST MEDICAL HISTORY:  ?Past Medical History:  ?Diagnosis Date  ? Abnormal cytology 1990  ?Had hysterectomy since  ? Allergic state 1992  ?Had allergy shots in [place]  ? Cancer (CMS-HCC) 1988  ?Melanoma surgery  ? Cataract cortical, senile 2002  ?Had cataract surgery  ? Colon polyps 03/19/2015  ? GERD (gastroesophageal reflux disease) 01/14/21  ? History of abnormal cervical Pap smear 1990  ?Had hysterectomy since  ? Mild intermittent asthma 03/19/2015  ?No inhalers post allergy shots  ? Osteoporosis 03/19/2015  ?Last dxa Jan 2015  ? ? ? ?PAST SURGICAL HISTORY:  ?Past Surgical History:  ?Procedure Laterality Date  ? CATARACT EXTRACTION  ? COLONOSCOPY 07/09/2018  ?Diverticulosis/PHx CP/Repeat 51yr/TKT  ? FRACTURE SURGERY 2012  ?Spine  ? HYSTERECTOMY  ? melanoma excision  ? OPEN REDUCTION VERTEBRAL FRACTURE/DISLOCATION  ? ROBOTIC COLECTOMY PARTIAL 04/24/2021  ?Dr ELesli Albee ? TONSILLECTOMY 1964  ? ? ?MEDICATIONS:  ?Outpatient Encounter Medications as of 05/14/2021  ?Medication Sig Dispense Refill  ? acyclovir (ZOVIRAX) 400 MG tablet Take 1 tablet (400 mg total) by mouth 2 (two) times daily TAKE 1 TABLET BY MOUTH TWICE DAILY TO PREVENT SHINGLES  ? cholecalciferol (VITAMIN D3) 2,000 unit capsule Take 2,000 Units by mouth once daily.  ? lidocaine-prilocaine (EMLA) cream lidocaine-prilocaine 2.5 %-2.5 % topical cream APPLY TOPICALLY TO THE AFFECTED AREA 1 TIME  ? meloxicam (MOBIC) 15 MG tablet Take 1 tablet (15 mg total) by mouth once daily  ? metoprolol tartrate (LOPRESSOR) 25 MG tablet Take 1 tablet (25 mg total) by mouth 2 (two)  times daily 60 tablet 0  ? montelukast (SINGULAIR) 10 mg tablet Take 1 tablet (10 mg total) by mouth as directed  ? omeprazole (PRILOSEC) 20 MG DR capsule Take 1 capsule (20 mg total) by mouth 2 (two) times daily  ? potassium chloride (KLOR-CON) 20 MEQ ER tablet Take 1 tablet (20 mEq total) by mouth once daily 30 tablet 5  ? traMADoL (ULTRAM) 50 mg tablet Take 1 tablet (50 mg total) by mouth every 6 (six) hours as needed for Pain  ? [DISCONTINUED] loratadine (CLARITIN) 10 mg tablet Take 10 mg by mouth once daily as needed (Patient not taking: Reported on 05/06/2021)  ? [DISCONTINUED] ondansetron (ZOFRAN) 8 MG tablet TAKE 1 TABLET BY MOUTH TWICE DAILY AS NEEDED FOR NAUSEA/VOMITING. START ON DAY 3 AFTER CYCLOPHOSPHAMIDE CHEMOTHEROPY (Patient not taking: Reported on 05/06/2021)  ? [DISCONTINUED] potassium chloride (KLOR-CON) 20 MEQ ER tablet Take 1 tablet (20 mEq total) by mouth 2 (two) times daily  ? [DISCONTINUED] predniSONE (DELTASONE) 50 MG tablet as directed (Patient not taking: Reported on 05/06/2021)  ? [DISCONTINUED] scopolamine (TRANSDERM-SCOP 1.5 MG) 1 mg over 3 days patch PLACE 1 PATCH ONTO THE SKIN EVERY 3RD DAY (Patient not taking: Reported on 05/06/2021)  ? ?No facility-administered encounter medications on file as of 05/14/2021.  ? ? ?ALLERGIES:  ?Patient has no known allergies. ? ?SOCIAL HISTORY:  ?Social History  ? ?Socioeconomic History  ? Marital status: Married  ? Number of children: 3  ?Occupational History  ? Occupation: retired mFurniture conservator/restorer ?  Tobacco Use  ? Smoking status: Never  ? Smokeless tobacco: Never  ?Substance and Sexual Activity  ? Alcohol use: Never  ? Drug use: No  ? Sexual activity: Not Currently  ?Partners: Male  ?Birth control/protection: Surgical, Post-menopausal  ? ?FAMILY HISTORY:  ?Family History  ?Problem Relation Age of Onset  ? Diabetes type II Mother  ? Colon polyps Mother  ? Osteoporosis (Thinning of bones) Mother  ? Skin cancer Mother  ? Coronary Artery Disease (Blocked  arteries around heart) Father  ? Skin cancer Father  ? Colon polyps Maternal Grandfather  ? Stroke Maternal Grandfather  ? Coronary Artery Disease (Blocked arteries around heart) Paternal Grandfather  ? Diabetes type II Paternal Grandmother  ? Stroke Paternal Grandmother  ? Stroke Paternal Uncle  ? ? ?PHYSICAL EXAM:  ?Vitals:  ?05/14/21 1057  ?BP: 130/69  ?Pulse: 87  ?.  ?Ht:157.5 cm ('5\' 2"'$ ) Wt:49.4 kg (109 lb) SNK:NLZJ surface area is 1.47 meters squared. ?Body mass index is 19.94 kg/m?Marland Kitchen. ? ?GENERAL: Alert, active, oriented x3 ? ?ABDOMEN: Soft and depressible, nontender with no palpable mass, no hepatomegaly. Wounds dry and clean. Colostomy pink and patent. ? ?EXTREMITIES: Well-developed well-nourished symmetrical with no dependent edema. ? ?NEUROLOGICAL: Awake alert oriented, facial expression symmetrical, moving all extremities. ? ? ?IMPRESSION:  ? ?Diverticulitis of large intestine with perforation without bleeding [K57.20] ?-S/p partial colectomy with end colostomy creation on 04/24/2021 ?-She has been recovering slowly but adequately. She is getting stronger. She is tolerating very diet. She is having adequate output through the colostomy. ?-Short-term plan is to continue physical therapy and recovering ?-Long-term plan is to consider colostomy reversal in 6 months. ?-We will get colonoscopy before colostomy reversal. ? ?History lymphoma ?-Patient is being evaluated by medical oncology. Last chemotherapy cycle was placed on hold due to diverticulitis with perforation. ?-I discussed case with medical oncologist. Due to the great response that she has had a delay in the last chemotherapy course, I will be considered to not completed the last course of chemotherapy. She will see medical oncology next week for further recommendations. ?-I discussed with the patient that any treatment for lymphoma will be priority  ? ?PLAN:  ?1. We will coordinate colonoscopy at the beginning of April to start preparation for  colostomy reversal ?2. Continue the great care you are doing ?3. No limitation or restriction for physical therapy ?4. I will see you in mid April after colonoscopy for discussion of colostomy reversal ?5. Contact us at any time if you have any concern ? ?Patient verbalized understanding, all questions were answered, and were agreeable with the plan outlined above.   ?

## 2021-09-12 NOTE — Interval H&P Note (Signed)
History and Physical Interval Note: ? ?09/12/2021 ?12:00 PM ? ?[name]  has presented today for surgery, with the diagnosis of COLOSTOMY STATUS.  The various methods of treatment have been discussed with the patient and family. After consideration of risks, benefits and other options for treatment, the patient has consented to  Procedure(s) with comments: ?COLONOSCOPY WITH PROPOFOL (N/A) - Dr Lysle Pearl can be 12:30 PM  procedure start time as a surgical intervention.  The patient's history has been reviewed, patient examined, no change in status, stable for surgery.  I have reviewed the patient's chart and labs.  Questions were answered to the patient's satisfaction.   ? ? ?Mary Washington Lysle Pearl ? ? ?

## 2021-09-12 NOTE — Op Note (Signed)
[facility] ?Gastroenterology ?Patient Name: Mary Washington ?Procedure Date: 09/12/2021 11:31 AM ?MRN: [id number] ?Account #: [id number] ?Date of Birth: 1946-04-14 ?Admit Type: Outpatient ?Age: 76 ?Room: [facility] ENDO ROOM 3 ?Gender: Female ?Note Status: Finalized ?Instrument Name: Colonoscope [id number] ?Procedure:             Colonoscopy ?Indications:           Follow-up of diverticulitis ?Providers:             Eliseo Squires MD, MD ?Referring MD:          Ocie Cornfield. Ouida Sills MD, MD (Referring MD) ?Medicines:             Propofol per Anesthesia ?Complications:         No immediate complications. ?Procedure:             Pre-Anesthesia Assessment: ?                       - After reviewing the risks and benefits, the patient  ?                       was deemed in satisfactory condition to undergo the  ?                       procedure in an ambulatory setting. ?                       After obtaining informed consent, the colonoscope was  ?                       passed under direct vision. Throughout the procedure,  ?                       the patient's blood pressure, pulse, and oxygen  ?                       saturations were monitored continuously. The  ?                       Colonoscope was introduced through the sigmoid  ?                       colostomy and advanced to the the cecum, identified by  ?                       the ileocecal valve. The colonoscopy was performed  ?                       without difficulty. The patient tolerated the  ?                       procedure well. The quality of the bowel preparation  ?                       was good. ?Findings: ?     One small-mouthed diverticulum was found at 10 cm proximal to the stoma. ?     The exam was otherwise without abnormality. ?     The perianal and digital rectal examinations were normal. ?Impression:            - Diverticulosis  in the area at 10 cm proximal to the  ?                       stoma. ?                       - The examination was  otherwise normal. ?                       - No specimens collected. ?Recommendation:        - Discharge patient to home. ?                       - Written discharge instructions were provided to the  ?                       patient. ?                       - Resume previous diet. ?Procedure Code(s):     --- Professional --- ?                       [phone], Colonoscopy through stoma; diagnostic,  ?                       including collection of specimen(s) by brushing or  ?                       washing, when performed (separate procedure) ?                       [id number], Sigmoidoscopy, flexible; diagnostic, including  ?                       collection of specimen(s) by brushing or washing, when  ?                       performed (separate procedure) ?Diagnosis Code(s):     --- Professional --- ?                       K57.30, Diverticulosis of large intestine without  ?                       perforation or abscess without bleeding ?CPT copyright 2019 [employer] Association. All rights reserved. ?The codes documented in this report are preliminary and upon coder review may  ?be revised to meet current compliance requirements. ?Dr. Sheppard Penton, MD ?Eliseo Squires MD, MD ?09/12/2021 1:02:17 PM ?This report has been signed electronically. ?Number of Addenda: 0 ?Note Initiated On: 09/12/2021 11:31 AM ?Scope Withdrawal Time: 0 hours 13 minutes 25 seconds  ?Total Procedure Duration: 0 hours 22 minutes 25 seconds  ?Estimated Blood Loss:  Estimated blood loss: none. ?     [facility] ?

## 2021-09-13 ENCOUNTER — Encounter: Payer: Self-pay | Admitting: Surgery

## 2021-10-01 ENCOUNTER — Ambulatory Visit: Payer: Self-pay | Admitting: General Surgery

## 2021-10-01 NOTE — H&P (Signed)
?HISTORY OF PRESENT ILLNESS:  ?  ?Mary Washington is a 76 y.o.female patient who comes for surgical evaluation of colostomy reversal. ? ?Patient with history of perforated diverticulitis.  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 ? ?Diverticulitis complicated with abscess.  She was tried to be treated with IV antibiotic therapy.  She was unable to completely resolve and after optimization and improvement of the white blood cell count will proceed with partial colectomy with end colostomy creation.  The was in November 2022. ? ?The patient has been recovering adequately.  The colostomy has been working properly.  She is tolerating diet.  She is not currently on chemotherapy.  Patient had colonoscopy on 09/12/2021 without any identified pathology.  I personally evaluated the images for the colonoscopy. ? ?Patient comes today for discussion of colostomy reversal. ?   ?  ?PAST MEDICAL HISTORY:  ?Past Medical History:  ?Diagnosis Date  ? Abnormal cytology 1990  ? Had hysterectomy since  ? Allergic state 1992  ? Had allergy shots in [place]  ? Cancer (CMS-HCC) 1988  ? Melanoma surgery  ? Cataract cortical, senile 2002  ? Had cataract surgery  ? Colon polyps 03/19/2015  ? GERD (gastroesophageal reflux disease) 01/14/21  ? History of abnormal cervical Pap smear 1990  ? Had hysterectomy since  ? Mild intermittent asthma 03/19/2015  ? No inhalers post allergy shots  ? Osteoporosis 03/19/2015  ? Last dxa Jan 2015  ?  ?  ?  ?PAST SURGICAL HISTORY:   ?Past Surgical History:  ?Procedure Laterality Date  ? TONSILLECTOMY  1964  ? FRACTURE SURGERY  2012  ? Spine  ? COLONOSCOPY  07/09/2018  ? Diverticulosis/PHx CP/Repeat 47yr/TKT  ? ROBOTIC COLECTOMY PARTIAL  04/24/2021  ? Dr ELesli Albee ? CATARACT EXTRACTION    ? HYSTERECTOMY    ? melanoma excision    ? OPEN REDUCTION VERTEBRAL FRACTURE/DISLOCATION    ?    ?   ?MEDICATIONS:  ?Outpatient Encounter Medications as of 10/01/2021  ?Medication Sig  Dispense Refill  ? acyclovir (ZOVIRAX) 400 MG tablet Take 400 mg by mouth 2 (two) times daily TAKE 1 TABLET BY MOUTH TWICE DAILY TO PREVENT SHINGLES    ? calcium carbonate (TUMS E-X) 300 mg (750 mg) chewable tablet Take 300 mg of elemental by mouth every 2 (two) hours as needed for Heartburn    ? cholecalciferol (VITAMIN D3) 2,000 unit capsule Take 2,000 Units by mouth once daily.    ? ibandronate (BONIVA) 150 mg tablet Take 1 tablet (150 mg total) by mouth every 30 (thirty) days Take with a full glass of water. Do not lie down for the next 60 min. 3 tablet 11  ? lidocaine-prilocaine (EMLA) cream lidocaine-prilocaine 2.5 %-2.5 % topical cream APPLY TOPICALLY TO THE AFFECTED AREA 1 TIME    ? meloxicam (MOBIC) 15 MG tablet Take 1 tablet (15 mg total) by mouth once daily    ? metoprolol tartrate (LOPRESSOR) 25 MG tablet Take 1 tablet (25 mg total) by mouth 2 (two) times daily 60 tablet 5  ? montelukast (SINGULAIR) 10 mg tablet Take 1 tablet (10 mg total) by mouth as directed    ? omeprazole (PRILOSEC) 20 MG DR capsule Take 1 capsule (20 mg total) by mouth 2 (two) times daily    ? traMADoL (ULTRAM) 50 mg tablet Take 1 tablet (50 mg total) by mouth every 6 (six) hours as needed for Pain    ? ciprofloxacin  HCl (CIPRO) 500 MG tablet Take 1 tablet (500 mg total) by mouth 2 (two) times daily for 1 day 2 tablet 0  ? metroNIDAZOLE (FLAGYL) 500 MG tablet Take 2 tablets at 2 pm, 3 pm and 10 pm the day before surgery. 6 tablet 0  ? potassium chloride (KLOR-CON) 20 MEQ ER tablet Take 1 tablet (20 mEq total) by mouth once daily (Patient not taking: Reported on 07/30/2021) 30 tablet 5  ? ?No facility-administered encounter medications on file as of 10/01/2021.  ? ?  ?ALLERGIES:   ?Patient has no known allergies. ?  ?SOCIAL HISTORY:  ?Social History  ? ?Socioeconomic History  ? Marital status: Married  ? Number of children: 3  ?Occupational History  ? Occupation: retired Furniture conservator/restorer  ?Tobacco Use  ? Smoking status: Never  ?  Smokeless tobacco: Never  ?Substance and Sexual Activity  ? Alcohol use: Never  ? Drug use: No  ? Sexual activity: Not Currently  ?  Partners: Male  ?  Birth control/protection: Surgical, Post-menopausal  ? ? ?FAMILY HISTORY:  ?Family History  ?Problem Relation Age of Onset  ? Diabetes type II Mother   ? Colon polyps Mother   ? Osteoporosis (Thinning of bones) Mother   ? Skin cancer Mother   ? Coronary Artery Disease (Blocked arteries around heart) Father   ? Skin cancer Father   ? Colon polyps Maternal Grandfather   ? Stroke Maternal Grandfather   ? Coronary Artery Disease (Blocked arteries around heart) Paternal Grandfather   ? Diabetes type II Paternal Grandmother   ? Stroke Paternal Grandmother   ? Stroke Paternal Uncle   ?  ? ?GENERAL REVIEW OF SYSTEMS:  ? ?General ROS: negative for - chills, fatigue, fever, weight gain or weight loss ?Allergy and Immunology ROS: negative for - hives  ?Hematological and Lymphatic ROS: negative for - bleeding problems or bruising, negative for palpable nodes ?Endocrine ROS: negative for - heat or cold intolerance, hair changes ?Respiratory ROS: negative for - cough, shortness of breath or wheezing ?Cardiovascular ROS: no chest pain or palpitations ?GI ROS: negative for nausea, vomiting, abdominal pain, diarrhea, constipation ?Musculoskeletal ROS: negative for - joint swelling or muscle pain ?Neurological ROS: negative for - confusion, syncope ?Dermatological ROS: negative for pruritus and rash ? ?PHYSICAL EXAM:  ?Vitals:  ? 10/01/21 1130  ?BP: (!) 146/75  ?Pulse: 72  ?.  ?Ht:157.5 cm ('5\' 2"'$ ) Wt:50.8 kg (112 lb) UXL:KGMW surface area is 1.49 meters squared. ?Body mass index is 20.49 kg/m?Marland Kitchen. ?  ?GENERAL: Alert, active, oriented x3 ? ?HEENT: Pupils equal reactive to light. Extraocular movements are intact. Sclera clear. Palpebral conjunctiva normal red color.Pharynx clear. ? ?NECK: Supple with no palpable mass and no adenopathy. ? ?LUNGS: Sound clear with no rales rhonchi or  wheezes. ? ?HEART: Regular rhythm S1 and S2 without murmur. ? ?ABDOMEN: Soft and depressible, nontender with no palpable mass, no hepatomegaly.  Left lower quadrant colostomy pink and patent. ? ?EXTREMITIES: Well-developed well-nourished symmetrical with no dependent edema. ? ?NEUROLOGICAL: Awake alert oriented, facial expression symmetrical, moving all extremities. ?  ?   ?IMPRESSION:  ?  ? 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  She is being followed by medical oncology. ?-Today she denies any chest pain or shortness of breath.  She is able to perform 4 METS without  difficulty breathing or chest pain. ? ?Today we discussed about colostomy reversal.  We discussed about the goal of robotic versus open colostomy reversal.  We discussed about the risk of surgery that include but not limited to bleeding, infection, anastomosis leak, intestinal obstruction, injury to ureter, injury to bladder, injury to adjacent intestine or organs, enterocutaneous fistula, among others.  The patient reports she understood the risk and agreed to proceed with surgery.       ?  ?PLAN:  ?1.  Robotic assisted laparoscopic colostomy reversal [phone]) ?2.  CBC, CMP ?3.  Avoid taking aspirin 5 days before the surgery ?4.  Take antibiotic therapy the day before surgery as prescribed ?5.  Take the bowel prep the day before surgery as instructed ?6.  Contact us if you have any concern ? ?Patient and her husband verbalized understanding, all questions were answered, and were agreeable with the plan outlined above.  ? ?Herbert Pun, MD ? ?Electronically signed by Herbert Pun, MD ? ?

## 2021-10-01 NOTE — H&P (View-Only) (Signed)
HISTORY OF PRESENT ILLNESS:    Mary Washington is a 76 y.o.female patient who comes for surgical evaluation of colostomy reversal.  Patient with history of perforated diverticulitis.  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  Diverticulitis complicated with abscess.  She was tried to be treated with IV antibiotic therapy.  She was unable to completely resolve and after optimization and improvement of the white blood cell count will proceed with partial colectomy with end colostomy creation.  The was in November 2022.  The patient has been recovering adequately.  The colostomy has been working properly.  She is tolerating diet.  She is not currently on chemotherapy.  Patient had colonoscopy on 09/12/2021 without any identified pathology.  I personally evaluated the images for the colonoscopy.  Patient comes today for discussion of colostomy reversal.      PAST MEDICAL HISTORY:  Past Medical History:  Diagnosis Date   Abnormal cytology 1990   Had hysterectomy since   Allergic state 1992   Had allergy shots in [place] (CMS-HCC) 1988   Melanoma surgery   Cataract cortical, senile 2002   Had cataract surgery   Colon polyps 03/19/2015   GERD (gastroesophageal reflux disease) 01/14/21   History of abnormal cervical Pap smear 1990   Had hysterectomy since   Mild intermittent asthma 03/19/2015   No inhalers post allergy shots   Osteoporosis 03/19/2015   Last dxa Jan 2015        PAST SURGICAL HISTORY:   Past Surgical History:  Procedure Laterality Date   [place]  2012   Spine   COLONOSCOPY  07/09/2018   Diverticulosis/PHx CP/Repeat 7yr/TKT   ROBOTIC COLECTOMY PARTIAL  04/24/2021   Dr ELesli Albee  CATARACT EXTRACTION     HYSTERECTOMY     melanoma excision     OPEN REDUCTION VERTEBRAL FRACTURE/DISLOCATION           MEDICATIONS:  Outpatient Encounter Medications as of 10/01/2021  Medication Sig  Dispense Refill   acyclovir (ZOVIRAX) 400 MG tablet Take 400 mg by mouth 2 (two) times daily TAKE 1 TABLET BY MOUTH TWICE DAILY TO PREVENT SHINGLES     calcium carbonate (TUMS E-X) 300 mg (750 mg) chewable tablet Take 300 mg of elemental by mouth every 2 (two) hours as needed for Heartburn     cholecalciferol (VITAMIN D3) 2,000 unit capsule Take 2,000 Units by mouth once daily.     ibandronate (BONIVA) 150 mg tablet Take 1 tablet (150 mg total) by mouth every 30 (thirty) days Take with a full glass of water. Do not lie down for the next 60 min. 3 tablet 11   lidocaine-prilocaine (EMLA) cream lidocaine-prilocaine 2.5 %-2.5 % topical cream APPLY TOPICALLY TO THE AFFECTED AREA 1 TIME     meloxicam (MOBIC) 15 MG tablet Take 1 tablet (15 mg total) by mouth once daily     metoprolol tartrate (LOPRESSOR) 25 MG tablet Take 1 tablet (25 mg total) by mouth 2 (two) times daily 60 tablet 5   montelukast (SINGULAIR) 10 mg tablet Take 1 tablet (10 mg total) by mouth as directed     omeprazole (PRILOSEC) 20 MG DR capsule Take 1 capsule (20 mg total) by mouth 2 (two) times daily     traMADoL (ULTRAM) 50 mg tablet Take 1 tablet (50 mg total) by mouth every 6 (six) hours as needed for Pain     ciprofloxacin  HCl (CIPRO) 500 MG tablet Take 1 tablet (500 mg total) by mouth 2 (two) times daily for 1 day 2 tablet 0   metroNIDAZOLE (FLAGYL) 500 MG tablet Take 2 tablets at 2 pm, 3 pm and 10 pm the day before surgery. 6 tablet 0   potassium chloride (KLOR-CON) 20 MEQ ER tablet Take 1 tablet (20 mEq total) by mouth once daily (Patient not taking: Reported on 07/30/2021) 30 tablet 5   No facility-administered encounter medications on file as of 10/01/2021.     ALLERGIES:   Patient has no known allergies.   SOCIAL HISTORY:  Social History   Socioeconomic History   Marital status: Married   Number of children: 3  Occupational History   Occupation: retired Furniture conservator/restorer  Tobacco Use   Smoking status: Never    Smokeless tobacco: Never  Substance and Sexual Activity   Alcohol use: Never   Drug use: No   Sexual activity: Not Currently    Partners: Male    Birth control/protection: Surgical, Post-menopausal    FAMILY HISTORY:  Family History  Problem Relation Age of Onset   Diabetes type II Mother    Colon polyps Mother    Osteoporosis (Thinning of bones) Mother    Skin cancer Mother    Coronary Artery Disease (Blocked arteries around heart) Father    Skin cancer Father    Colon polyps Maternal Grandfather    Stroke Maternal Grandfather    Coronary Artery Disease (Blocked arteries around heart) Paternal Grandfather    Diabetes type II Paternal Grandmother    Stroke Paternal Grandmother    Stroke Paternal Uncle      GENERAL REVIEW OF SYSTEMS:   General ROS: negative for - chills, fatigue, fever, weight gain or weight loss Allergy and Immunology ROS: negative for - hives  Hematological and Lymphatic ROS: negative for - bleeding problems or bruising, negative for palpable nodes Endocrine ROS: negative for - heat or cold intolerance, hair changes Respiratory ROS: negative for - cough, shortness of breath or wheezing Cardiovascular ROS: no chest pain or palpitations GI ROS: negative for nausea, vomiting, abdominal pain, diarrhea, constipation Musculoskeletal ROS: negative for - joint swelling or muscle pain Neurological ROS: negative for - confusion, syncope Dermatological ROS: negative for pruritus and rash  PHYSICAL EXAM:  Vitals:   10/01/21 1130  BP: (!) 146/75  Pulse: 72  .  Ht:157.5 cm ('5\' 2"'$ ) Wt:50.8 kg (112 lb) JOA:CZYS surface area is 1.49 meters squared. Body mass index is 20.49 kg/m.Marland Kitchen   GENERAL: Alert, active, oriented x3  HEENT: Pupils equal reactive to light. Extraocular movements are intact. Sclera clear. Palpebral conjunctiva normal red color.Pharynx clear.  NECK: Supple with no palpable mass and no adenopathy.  LUNGS: Sound clear with no rales rhonchi or  wheezes.  HEART: Regular rhythm S1 and S2 without murmur.  ABDOMEN: Soft and depressible, nontender with no palpable mass, no hepatomegaly.  Left lower quadrant colostomy pink and patent.  EXTREMITIES: Well-developed well-nourished symmetrical with no dependent edema.  NEUROLOGICAL: Awake alert oriented, facial expression symmetrical, moving all extremities.      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  She is being followed by medical oncology. -Today she denies any chest pain or shortness of breath.  She is able to perform 4 METS without  difficulty breathing or chest pain.  Today we discussed about colostomy reversal.  We discussed about the goal of robotic versus open colostomy reversal.  We discussed about the risk of surgery that include but not limited to bleeding, infection, anastomosis leak, intestinal obstruction, injury to ureter, injury to bladder, injury to adjacent intestine or organs, enterocutaneous fistula, among others.  The patient reports she understood the risk and agreed to proceed with surgery.         PLAN:  1.  Robotic assisted laparoscopic colostomy reversal (82641) 2.  CBC, CMP 3.  Avoid taking aspirin 5 days before the surgery 4.  Take antibiotic therapy the day before surgery as prescribed 5.  Take the bowel prep the day before surgery as instructed 6.  Contact us if you have any concern  Patient and her husband verbalized understanding, all questions were answered, and were agreeable with the plan outlined above.   Herbert Pun, MD  Electronically signed by Herbert Pun, MD

## 2021-10-08 ENCOUNTER — Inpatient Hospital Stay: Payer: Medicare Other | Attending: Internal Medicine

## 2021-10-08 ENCOUNTER — Encounter: Payer: Self-pay | Admitting: Internal Medicine

## 2021-10-08 ENCOUNTER — Inpatient Hospital Stay: Payer: Medicare Other | Admitting: Internal Medicine

## 2021-10-08 DIAGNOSIS — M25562 Pain in left knee: Secondary | ICD-10-CM | POA: Insufficient documentation

## 2021-10-08 DIAGNOSIS — G629 Polyneuropathy, unspecified: Secondary | ICD-10-CM | POA: Insufficient documentation

## 2021-10-08 DIAGNOSIS — C8338 Diffuse large B-cell lymphoma, lymph nodes of multiple sites: Secondary | ICD-10-CM | POA: Diagnosis present

## 2021-10-08 DIAGNOSIS — C8581 Other specified types of non-Hodgkin lymphoma, lymph nodes of head, face, and neck: Secondary | ICD-10-CM | POA: Diagnosis not present

## 2021-10-08 DIAGNOSIS — Z79899 Other long term (current) drug therapy: Secondary | ICD-10-CM | POA: Diagnosis not present

## 2021-10-08 DIAGNOSIS — Z452 Encounter for adjustment and management of vascular access device: Secondary | ICD-10-CM | POA: Insufficient documentation

## 2021-10-08 DIAGNOSIS — Z85820 Personal history of malignant melanoma of skin: Secondary | ICD-10-CM | POA: Insufficient documentation

## 2021-10-08 DIAGNOSIS — E871 Hypo-osmolality and hyponatremia: Secondary | ICD-10-CM | POA: Insufficient documentation

## 2021-10-08 DIAGNOSIS — M549 Dorsalgia, unspecified: Secondary | ICD-10-CM | POA: Insufficient documentation

## 2021-10-08 DIAGNOSIS — Z95828 Presence of other vascular implants and grafts: Secondary | ICD-10-CM

## 2021-10-08 LAB — COMPREHENSIVE METABOLIC PANEL
ALT: 15 U/L (ref 0–44)
AST: 23 U/L (ref 15–41)
Albumin: 3.9 g/dL (ref 3.5–5.0)
Alkaline Phosphatase: 106 U/L (ref 38–126)
Anion gap: 9 (ref 5–15)
BUN: 22 mg/dL (ref 8–23)
CO2: 24 mmol/L (ref 22–32)
Calcium: 8.9 mg/dL (ref 8.9–10.3)
Chloride: 95 mmol/L — ABNORMAL LOW (ref 98–111)
Creatinine, Ser: 0.75 mg/dL (ref 0.44–1.00)
GFR, Estimated: >60 mL/min (ref >60)
Glucose, Bld: 135 mg/dL — ABNORMAL HIGH (ref 70–99)
Potassium: 4.1 mmol/L (ref 3.5–5.1)
Sodium: 128 mmol/L — ABNORMAL LOW (ref 135–145)
Total Bilirubin: 0.6 mg/dL (ref 0.3–1.2)
Total Protein: 6.3 g/dL — ABNORMAL LOW (ref 6.5–8.1)

## 2021-10-08 LAB — CBC WITH DIFFERENTIAL/PLATELET
Abs Immature Granulocytes: 0.05 10*3/uL (ref 0.00–0.07)
Basophils Absolute: 0 10*3/uL (ref 0.0–0.1)
Basophils Relative: 0 %
Eosinophils Absolute: 0.2 10*3/uL (ref 0.0–0.5)
Eosinophils Relative: 2 %
HCT: 41.5 % (ref 36.0–46.0)
Hemoglobin: 13.6 g/dL (ref 12.0–15.0)
Immature Granulocytes: 1 %
Lymphocytes Relative: 21 %
Lymphs Abs: 1.8 10*3/uL (ref 0.7–4.0)
MCH: 26.5 pg (ref 26.0–34.0)
MCHC: 32.8 g/dL (ref 30.0–36.0)
MCV: 80.9 fL (ref 80.0–100.0)
Monocytes Absolute: 0.7 10*3/uL (ref 0.1–1.0)
Monocytes Relative: 8 %
Neutro Abs: 5.6 10*3/uL (ref 1.7–7.7)
Neutrophils Relative %: 68 %
Platelets: 309 10*3/uL (ref 150–400)
RBC: 5.13 MIL/uL — ABNORMAL HIGH (ref 3.87–5.11)
RDW: 14.6 % (ref 11.5–15.5)
WBC: 8.3 10*3/uL (ref 4.0–10.5)
nRBC: 0 % (ref 0.0–0.2)

## 2021-10-08 LAB — LACTATE DEHYDROGENASE: LDH: 178 U/L (ref 98–192)

## 2021-10-08 MED ORDER — HEPARIN SOD (PORK) LOCK FLUSH 100 UNIT/ML IV SOLN
500.0000 [IU] | Freq: Once | INTRAVENOUS | Status: AC
  Administered 2021-10-08: 500 [IU] via INTRAVENOUS
  Filled 2021-10-08: qty 5

## 2021-10-08 MED ORDER — SODIUM CHLORIDE 0.9% FLUSH
10.0000 mL | Freq: Once | INTRAVENOUS | Status: AC
  Administered 2021-10-08: 10 mL via INTRAVENOUS
  Filled 2021-10-08: qty 10

## 2021-10-08 NOTE — Progress Notes (Signed)
Survivorship Care Plan visit completed.  Treatment summary reviewed and given to patient.  ASCO answers booklet reviewed and given to patient.  CARE program and Cancer Transitions discussed with patient along with other resources cancer center offers to patients and caregivers.  Patient verbalized understanding.    

## 2021-10-08 NOTE — Progress Notes (Signed)
Reading ?CONSULT NOTE ? ?Patient Care Team: ?[name], MD as PCP - General (Internal Medicine) ?[name], MD as Consulting Physician (Oncology) ? ?CHIEF COMPLAINTS/PURPOSE OF CONSULTATION: Lymphoma ?Oncology History Overview Note  ? ?# Incidental- MAY 2022-CT scan chest abdomen pelvis consistent with bulky retroperitoneal adenopathy [6-8 cm]; subpectoral/axillary lymphadenopathy 2 to 2.5 cm; PET June 2022-bulky right axillary/subpectoral; bulky retroperitoneal adenopathy.  S/p right axilla lymph node biopsy--large B-cell lymphoma; FISH panel QNS; repeat biopsy ? ?# June 29th, 2022-rituximab only infusion; November 2022-cycle #5 R-CHOP chemotherapy-discontinued chemotherapy [complicated diverticulitis]; FEB 2023-PET scan NED ? ?#Kyphoplasty-T7 &T8- [IR]-compression fractures ?  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  Repeat MRI 3-6 m ?  ?# coloscopy [2022; [facility]]; mammogram- nov 2021-WNL. ?  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 . ? ?# SURVIVORSHIP:  ? ?# GENETICS:  ? ?DIAGNOSIS:  ? ?STAGE:         ;  GOALS: ? ?CURRENT/MOST RECENT THERAPY :  ? ?  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 ?  ?11/28/2020 -  Chemotherapy  ? Patient is on Treatment Plan : NON-HODGKINS LYMPHOMA R-CHOP q21d  ? ?  ?  ?12/09/2020 Cancer Staging  ? Staging form: Lymphoid Neoplasms, AJCC 6th Edition ?- Clinical: Stage IV - Signed by [name], MD on 12/09/2020 ?Diagnostic confirmation: Positive histology ?Specimen type: Core Needle Biopsy ?Histopathologic type: Malignant lymphoma, large B-cell, diffuse, NOS ?Multiple tumors: Yes ? ?  ? ? ? ?HISTORY OF PRESENTING ILLNESS: Accompanied by husband.  Walking with a rolling walker.   ? ?Mary Washington 76 y.o.  female n diffuse large B-cell lymphoma stage III STATUS POST 5 CYCLES OF R-CHOP chemotherapy- is here for a follow-up/. ? ?In the interim patient underwent kyphoplasty of T7-T8 vertebrae for compression fractures. ? ?States her pain is improved not resolved.  She continues to take Tylenol as needed. ? ?Also evaluated by surgery awaiting reversal of her colostomy. ? ?Patient is currently home.   No worsening shortness of breath or cough. ? ?Appetite is good.  She is walking with a walker.  Denies any new lumps or bumps. ? ? ?Review of Systems  ?Constitutional:  Positive for malaise/fatigue. Negative for chills, diaphoresis and fever.  ?HENT:  Negative for nosebleeds and sore throat.   ?Eyes:  Negative for double vision.  ?Respiratory:  Negative for cough, hemoptysis, sputum production, shortness of breath and wheezing.   ?Cardiovascular:  Negative for chest pain, palpitations, orthopnea and leg swelling.  ?Gastrointestinal:  Negative for abdominal pain, blood in stool, diarrhea, heartburn, melena, nausea and vomiting.  ?Genitourinary:  Negative for dysuria, frequency and urgency.  ?Musculoskeletal:  Positive for joint pain.  ?Skin: Negative.  Negative for itching and rash.  ?Neurological:  Negative for dizziness, tingling, focal weakness, weakness and headaches.  ?Endo/Heme/Allergies:  Does not bruise/bleed easily.  ?Psychiatric/Behavioral:  Negative for depression. The patient is not nervous/anxious and does not have insomnia.    ? ?MEDICAL HISTORY:  ?Past Medical History:  ?Diagnosis Date  ? Colon polyps   ? Diverticulosis   ? Melanoma ([place]) 1988  ? lt upper arm, some lymph nodes removed  ? Mild intermittent asthma   ? Osteoporosis   ? ? ?SURGICAL HISTORY: ?Past  Surgical History:  ?Procedure Laterality Date  ? BACK SURGERY    ? CATARACT EXTRACTION    ? COLONOSCOPY WITH PROPOFOL N/A 09/12/2021  ? Procedure: COLONOSCOPY WITH PROPOFOL;  Surgeon: Benjamine Sprague, DO;  Location: [facility] ENDOSCOPY;   Service: General;  Laterality: N/A;  Dr Lysle Pearl can be 12:30 PM  procedure start time  ? IR IMAGING GUIDED PORT INSERTION  11/23/2020  ? IR KYPHO THORACIC WITH BONE BIOPSY  08/12/2021  ? IR RADIOLOGIST EVAL & MGMT  07/23/2021  ? IR RADIOLOGIST EVAL & MGMT  09/11/2021  ? PARTIAL COLECTOMY N/A 04/24/2021  ? Procedure: PARTIAL COLECTOMY;  Surgeon: Herbert Pun, MD;  Location: [facility] ORS;  Service: General;  Laterality: N/A;  ? TONSILLECTOMY    ? VAGINAL HYSTERECTOMY  1990  ? ? ?SOCIAL HISTORY: ?Social History  ? ?Socioeconomic History  ? Marital status: Married  ?  Spouse name: Mikki Santee  ? Number of children: 2  ? Years of education: Not on file  ? Highest education level: Not on file  ?Occupational History  ? Not on file  ?Tobacco Use  ? Smoking status: Never  ? Smokeless tobacco: Never  ?Vaping Use  ? Vaping Use: Never used  ?Substance and Sexual Activity  ? Alcohol use: Never  ? Drug use: Never  ? Sexual activity: Yes  ?  Partners: Male  ?Other Topics Concern  ? Not on file  ?Social History Narrative  ? ** Merged History Encounter **  ?    ? Lives at home with husband.   ? ?Social Determinants of Health  ? ?Financial Resource Strain: Not on file  ?Food Insecurity: Not on file  ?Transportation Needs: Not on file  ?Physical Activity: Not on file  ?Stress: Not on file  ?Social Connections: Not on file  ?Intimate Partner Violence: Not on file  ? ? ?FAMILY HISTORY: ?Family History  ?Problem Relation Age of Onset  ? Breast cancer Neg Hx   ? ? ?ALLERGIES:  has No Known Allergies. ? ?MEDICATIONS:  ?Current Outpatient Medications  ?Medication Sig Dispense Refill  ? acetaminophen (TYLENOL) 500 MG tablet Take 500 mg by mouth every 6 (six) hours as needed.    ? Cholecalciferol (VITAMIN D) 50 MCG (2000 UT) CAPS Take 1 capsule by mouth daily.    ? ibandronate (BONIVA) 150 MG tablet Take 150 mg by mouth every 30 (thirty) days. Take in the morning with a full glass of water, on an empty stomach, and do not take anything else by mouth  or lie down for the next 30 min.    ? lidocaine-prilocaine (EMLA) cream lidocaine-prilocaine 2.5 %-2.5 % topical cream ? APPLY TOPICALLY TO THE AFFECTED AREA 1 TIME    ? Magnesium Oxide (MAG-OXIDE PO) Take 1 capsule by mouth daily at 12 noon.    ? meloxicam (MOBIC) 15 MG tablet Take 1 tablet by mouth daily.    ? metoprolol tartrate (LOPRESSOR) 25 MG tablet Take 1 tablet (25 mg total) by mouth 2 (two) times daily. 60 tablet 0  ? Multiple Vitamins-Minerals (WOMENS MULTIVITAMIN PO) Take by mouth.    ? omeprazole (PRILOSEC) 20 MG capsule Take 20 mg by mouth 2 (two) times daily.    ? potassium chloride SA (KLOR-CON) 20 MEQ tablet TAKE 1 TABLET BY MOUTH TWICE DAILY (Patient taking differently: 20 mEq once.) 180 tablet 3  ? traMADol (ULTRAM) 50 MG tablet Take 1 tablet by mouth every 6 (six) hours as needed.    ? acyclovir (ZOVIRAX) 400 MG tablet Take  1 tablet (400 mg total) by mouth 2 (two) times daily. To prevent shingles 60 tablet 3  ? allopurinol (ZYLOPRIM) 300 MG tablet TAKE 1 TABLET(300 MG) BY MOUTH TWICE DAILY TO AVOID GOUT FLARE UP WHILE ON CHEMO 60 tablet 2  ? metoprolol tartrate (LOPRESSOR) 25 MG tablet Take 25 mg by mouth in the morning and at bedtime.    ? montelukast (SINGULAIR) 10 MG tablet TAKE 1 TABLET(10 MG) BY MOUTH AT BEDTIME. START 2 DAYS BEFORE INFUSION. TAKE IT FOR 4 DAYS 90 tablet 1  ? ?No current facility-administered medications for this visit.  ? ?Facility-Administered Medications Ordered in Other Visits  ?Medication Dose Route Frequency Provider Last Rate Last Admin  ? sodium chloride flush (NS) 0.9 % injection 10 mL  10 mL Intravenous PRN [name], MD   10 mL at 01/07/21 0919  ? ? ?  ?. ? ?PHYSICAL EXAMINATION: ?ECOG PERFORMANCE STATUS: 1 - Symptomatic but completely ambulatory ? ?Vitals:  ? 10/08/21 1404  ?BP: (!) 160/79  ?Pulse: 86  ?Resp: 16  ?Temp: 97.7 ?F (36.5 ?C)  ? ? ?Filed Weights  ? 10/08/21 1404  ?Weight: 116 lb (52.6 kg)  ? ? ? ? ?Physical Exam ?HENT:  ?   Head:  Normocephalic and atraumatic.  ?   Mouth/Throat:  ?   Pharynx: No oropharyngeal exudate.  ?Eyes:  ?   Pupils: Pupils are equal, round, and reactive to light.  ?Cardiovascular:  ?   Rate and Rhythm: Normal rate and r

## 2021-10-08 NOTE — Progress Notes (Signed)
Patient had kyphoplasty due to compression fractures sine her last visit.   ?

## 2021-10-08 NOTE — Assessment & Plan Note (Addendum)
#  Diffuse large B-cell lymphoma-stage III.  PET June 2022-bulky right axillary/subpectoral; bulky retroperitoneal adenopathy; s/p 5 cycles of R-CHOP; discontinued cycle #6 [complicated sigmoid diverticulitis]-FEB 3rd, 2023- PET scan July 06, 2020-no residual/recurrent lymphoma. ? ?#Continue clinical surveillance at this time.  We will plan imaging based on symptoms. ? ?#Compression fractures -PET scan February 2023 incidental  multiple thoracolumbar compression fractures progressive T7-T8- ? Osteoporotic; NO BMD since 2016 [prior in [place], [place]]- s/p-kyphoplasty of thoracic vertebra x2 [IR]-pain improved not resolved.  Monitor for now ? ?#Complicated sigmoid diverticulitis [NOV 2022] s/p left hemicolectomy [Dr.Cintron]-  Patient awaiting reevaluation for reversal of colostomy in MAY 22nd. 2023.  ? ? 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    Discussed next visit ? ?# Hyponatremia sodium 128- chronic- 130s- stable-likely poor p.o. intake.; increased salt intake- STABLE. .  ? ?#Peripheral neuropathy grade 1-secondary vincristine.  Monitor for now-STABLE.  ? ?# chronic back pain-likely secondary to lymphoma impinging the neuroforaminal nerve- Improved; # Left knee pain/stiff [s/p Evaluation Ortho]- on volatren gel-stable ? ?# Port/IV access- Stable; discussed re: pro and cons of keeping the port vs. Explantation; Poor IV access-keep for now.  ? ?* [phone] [home] ? ?# DISPOSITION: ?# port flush in 6 weeks ?# follow up in 3 months- MD; labs- cbc/cmp;LDH; port flush--Dr.B ? ? ? ? ? ?

## 2021-10-10 DIAGNOSIS — C8281 Other types of follicular lymphoma, lymph nodes of head, face, and neck: Secondary | ICD-10-CM | POA: Insufficient documentation

## 2021-10-14 ENCOUNTER — Other Ambulatory Visit: Payer: Medicare Other

## 2021-10-16 ENCOUNTER — Encounter
Admission: RE | Admit: 2021-10-16 | Discharge: 2021-10-16 | Disposition: A | Discharge status: Home / Self Care | Payer: Medicare Other | Source: Ambulatory Visit | Attending: General Surgery | Admitting: General Surgery

## 2021-10-16 DIAGNOSIS — Z01818 Encounter for other preprocedural examination: Secondary | ICD-10-CM

## 2021-10-16 DIAGNOSIS — R000 Tachycardia, unspecified: Secondary | ICD-10-CM

## 2021-10-16 DIAGNOSIS — E871 Hypo-osmolality and hyponatremia: Secondary | ICD-10-CM

## 2021-10-16 HISTORY — DX: Tachycardia, unspecified: R00.0

## 2021-10-16 HISTORY — DX: Colostomy status: Z93.3

## 2021-10-16 HISTORY — DX: Unspecified osteoarthritis, unspecified site: M19.90

## 2021-10-16 HISTORY — DX: Hypo-osmolality and hyponatremia: E87.1

## 2021-10-16 HISTORY — DX: Anemia, unspecified: D64.9

## 2021-10-16 HISTORY — DX: Gastro-esophageal reflux disease without esophagitis: K21.9

## 2021-10-16 HISTORY — DX: Family history of other specified conditions: Z84.89

## 2021-10-16 HISTORY — DX: Other complications of anesthesia, initial encounter: T88.59XA

## 2021-10-16 HISTORY — DX: Other specified types of non-hodgkin lymphoma, lymph nodes of head, face, and neck: C85.81

## 2021-10-16 NOTE — Patient Instructions (Signed)
Your procedure is scheduled on:10-21-21 Monday ?Report to the Registration Desk on the 1st floor of the [facility].Then proceed to the 2nd floor Surgery Desk ?To find out your arrival time, please call [phone] between 1PM - 3PM on:10-18-21 Friday ?If your arrival time is 6:00 am, do not arrive prior to that time as the [facility] entrance doors do not open until 6:00 am. ? ?REMEMBER: ?Instructions that are not followed completely may result in serious medical risk, up to and including death; or upon the discretion of your surgeon and anesthesiologist your surgery may need to be rescheduled. ? ?Do not eat food OR drink any liquids after midnight the night before surgery.  ?No gum chewing, lozengers or hard candies. ? ?TAKE THESE MEDICATIONS THE MORNING OF SURGERY WITH A SIP OF WATER: ?-metoprolol tartrate (LOPRESSOR)  ?-omeprazole (PRILOSEC) -take one the night before and one on the morning of surgery - helps to prevent nausea after surgery.) ? ?One week prior to surgery: ?Stop Anti-inflammatories (NSAIDS) such as meloxicam (MOBIC), Advil, Aleve, Ibuprofen, Motrin, Naproxen, Naprosyn and Aspirin based products such as Excedrin, Goodys Powder, BC Powder.You may however, continue to take Tylenol if needed for pain up until the day of surgery. ?Stop ANY OVER THE COUNTER supplements/vitamins NOW (10-16-21) until after surgery (Calcium +Vitamin D, Vitamin D, Magnesium and Multivitamin) ? ?No Alcohol for 24 hours before or after surgery. ? ?No Smoking including e-cigarettes for 24 hours prior to surgery.  ?No chewable tobacco products for at least 6 hours prior to surgery.  ?No nicotine patches on the day of surgery. ? ?Do not use any "recreational" drugs for at least a week prior to your surgery.  ?Please be advised that the combination of cocaine and anesthesia may have negative outcomes, up to and including death. ?If you test positive for cocaine, your surgery will be cancelled. ? ?On the morning of surgery  brush your teeth with toothpaste and water, you may rinse your mouth with mouthwash if you wish. ?Do not swallow any toothpaste or mouthwash. ? ?Use CHG Soap as directed on instruction sheet. ? ?Do not wear jewelry, make-up, hairpins, clips or nail polish. ? ?Do not wear lotions, powders, or perfumes.  ? ?Do not shave body from the neck down 48 hours prior to surgery just in case you cut yourself which could leave a site for infection.  ?Also, freshly shaved skin may become irritated if using the CHG soap. ? ?Contact lenses, hearing aids and dentures may not be worn into surgery. ? ?Do not bring valuables to the hospital. [facility] is not responsible for any missing/lost belongings or valuables.  ? ?Notify your doctor if there is any change in your medical condition (cold, fever, infection). ? ?Wear comfortable clothing (specific to your surgery type) to the hospital. ? ?After surgery, you can help prevent lung complications by doing breathing exercises.  ?Take deep breaths and cough every 1-2 hours. Your doctor may order a device called an Incentive Spirometer to help you take deep breaths. ?When coughing or sneezing, hold a pillow firmly against your incision with both hands. This is called ?splinting.? Doing this helps protect your incision. It also decreases belly discomfort. ? ?If you are being admitted to the hospital overnight, leave your suitcase in the car. ?After surgery it may be brought to your room. ? ?If you are being discharged the day of surgery, you will not be allowed to drive home. ?You will need a responsible adult (18 years or older)  to drive you home and stay with you that night.  ? ?If you are taking public transportation, you will need to have a responsible adult (18 years or older) with you. ?Please confirm with your physician that it is acceptable to use public transportation.  ? ?Please call the [facility] Dept. at [phone] if you have any questions about these  instructions. ? ?Surgery Visitation Policy: ? ?Patients undergoing a surgery or procedure may have two family members or support persons with them as long as the person is not COVID-19 positive or experiencing its symptoms.  ? ?Inpatient Visitation:   ? ?Visiting hours are 7 a.m. to 8 p.m. ?Up to four visitors are allowed at one time in a patient room, including children. The visitors may rotate out with other people during the day. One designated support person (adult) may remain overnight.  ?

## 2021-10-17 ENCOUNTER — Encounter
Admission: RE | Admit: 2021-10-17 | Discharge: 2021-10-17 | Disposition: A | Discharge status: Home / Self Care | Payer: Medicare Other | Source: Ambulatory Visit | Attending: General Surgery | Admitting: General Surgery

## 2021-10-17 DIAGNOSIS — E871 Hypo-osmolality and hyponatremia: Secondary | ICD-10-CM | POA: Insufficient documentation

## 2021-10-17 DIAGNOSIS — Z01818 Encounter for other preprocedural examination: Secondary | ICD-10-CM | POA: Diagnosis present

## 2021-10-17 DIAGNOSIS — R000 Tachycardia, unspecified: Secondary | ICD-10-CM | POA: Insufficient documentation

## 2021-10-17 LAB — BASIC METABOLIC PANEL
Anion gap: 9 (ref 5–15)
BUN: 16 mg/dL (ref 8–23)
CO2: 27 mmol/L (ref 22–32)
Calcium: 9.4 mg/dL (ref 8.9–10.3)
Chloride: 99 mmol/L (ref 98–111)
Creatinine, Ser: 0.67 mg/dL (ref 0.44–1.00)
GFR, Estimated: >60 mL/min (ref >60)
Glucose, Bld: 103 mg/dL — ABNORMAL HIGH (ref 70–99)
Potassium: 4.4 mmol/L (ref 3.5–5.1)
Sodium: 135 mmol/L (ref 135–145)

## 2021-10-21 ENCOUNTER — Other Ambulatory Visit: Payer: Self-pay

## 2021-10-21 ENCOUNTER — Inpatient Hospital Stay: Payer: Medicare Other | Admitting: Anesthesiology

## 2021-10-21 ENCOUNTER — Inpatient Hospital Stay: Payer: Medicare Other | Admitting: Urgent Care

## 2021-10-21 ENCOUNTER — Inpatient Hospital Stay
Admission: RE | Admit: 2021-10-21 | Discharge: 2021-10-30 | DRG: 330 | Disposition: A | Discharge status: Home / Self Care | Payer: Medicare Other | Attending: General Surgery | Admitting: General Surgery

## 2021-10-21 ENCOUNTER — Encounter: Payer: Self-pay | Admitting: General Surgery

## 2021-10-21 ENCOUNTER — Encounter
Admission: RE | Disposition: A | Discharge status: Home / Self Care | Payer: Self-pay | Source: Home / Self Care | Attending: General Surgery

## 2021-10-21 DIAGNOSIS — M810 Age-related osteoporosis without current pathological fracture: Secondary | ICD-10-CM | POA: Diagnosis present

## 2021-10-21 DIAGNOSIS — Z8371 Family history of colonic polyps: Secondary | ICD-10-CM | POA: Diagnosis not present

## 2021-10-21 DIAGNOSIS — K660 Peritoneal adhesions (postprocedural) (postinfection): Secondary | ICD-10-CM | POA: Diagnosis present

## 2021-10-21 DIAGNOSIS — Z8262 Family history of osteoporosis: Secondary | ICD-10-CM | POA: Diagnosis not present

## 2021-10-21 DIAGNOSIS — Z8249 Family history of ischemic heart disease and other diseases of the circulatory system: Secondary | ICD-10-CM

## 2021-10-21 DIAGNOSIS — K219 Gastro-esophageal reflux disease without esophagitis: Secondary | ICD-10-CM | POA: Diagnosis present

## 2021-10-21 DIAGNOSIS — Z823 Family history of stroke: Secondary | ICD-10-CM

## 2021-10-21 DIAGNOSIS — Z933 Colostomy status: Principal | ICD-10-CM

## 2021-10-21 DIAGNOSIS — Z808 Family history of malignant neoplasm of other organs or systems: Secondary | ICD-10-CM | POA: Diagnosis not present

## 2021-10-21 DIAGNOSIS — Z85820 Personal history of malignant melanoma of skin: Secondary | ICD-10-CM | POA: Diagnosis not present

## 2021-10-21 DIAGNOSIS — K5792 Diverticulitis of intestine, part unspecified, without perforation or abscess without bleeding: Secondary | ICD-10-CM | POA: Diagnosis present

## 2021-10-21 DIAGNOSIS — K567 Ileus, unspecified: Secondary | ICD-10-CM | POA: Diagnosis not present

## 2021-10-21 DIAGNOSIS — J4520 Mild intermittent asthma, uncomplicated: Secondary | ICD-10-CM | POA: Diagnosis present

## 2021-10-21 DIAGNOSIS — Z79899 Other long term (current) drug therapy: Secondary | ICD-10-CM | POA: Diagnosis not present

## 2021-10-21 DIAGNOSIS — Z833 Family history of diabetes mellitus: Secondary | ICD-10-CM

## 2021-10-21 DIAGNOSIS — Z8719 Personal history of other diseases of the digestive system: Secondary | ICD-10-CM

## 2021-10-21 DIAGNOSIS — K9189 Other postprocedural complications and disorders of digestive system: Secondary | ICD-10-CM | POA: Diagnosis not present

## 2021-10-21 DIAGNOSIS — Z90710 Acquired absence of both cervix and uterus: Secondary | ICD-10-CM | POA: Diagnosis not present

## 2021-10-21 DIAGNOSIS — Z433 Encounter for attention to colostomy: Secondary | ICD-10-CM | POA: Diagnosis present

## 2021-10-21 DIAGNOSIS — C8330 Diffuse large B-cell lymphoma, unspecified site: Secondary | ICD-10-CM | POA: Diagnosis present

## 2021-10-21 HISTORY — PX: LAPAROSCOPIC LYSIS OF ADHESIONS: SHX5905

## 2021-10-21 HISTORY — PX: XI ROBOTIC ASSISTED COLOSTOMY TAKEDOWN: SHX6828

## 2021-10-21 SURGERY — CLOSURE, COLOSTOMY, ROBOT-ASSISTED
Anesthesia: General | Site: Abdomen

## 2021-10-21 MED ORDER — ACETAMINOPHEN 500 MG PO TABS: 1000.0000 mg | ORAL_TABLET | ORAL | Status: AC

## 2021-10-21 MED ORDER — ONDANSETRON HCL 4 MG/2ML IJ SOLN
4.0000 mg | Freq: Four times a day (QID) | INTRAMUSCULAR | Status: DC | PRN
  Administered 2021-10-23: 4 mg via INTRAVENOUS
  Filled 2021-10-21: qty 2

## 2021-10-21 MED ORDER — ONDANSETRON HCL 4 MG/2ML IJ SOLN
INTRAMUSCULAR | Status: AC
  Filled 2021-10-21: qty 2

## 2021-10-21 MED ORDER — DEXAMETHASONE SODIUM PHOSPHATE 10 MG/ML IJ SOLN
INTRAMUSCULAR | Status: AC
  Filled 2021-10-21: qty 1

## 2021-10-21 MED ORDER — PANTOPRAZOLE SODIUM 40 MG IV SOLR
40.0000 mg | Freq: Every day | INTRAVENOUS | Status: DC
  Administered 2021-10-21 – 2021-10-29 (×9): 40 mg via INTRAVENOUS
  Filled 2021-10-21 (×9): qty 10

## 2021-10-21 MED ORDER — PROMETHAZINE HCL 25 MG/ML IJ SOLN: 6.2500 mg | INTRAMUSCULAR | Status: DC | PRN

## 2021-10-21 MED ORDER — SODIUM CHLORIDE 0.9 % IV SOLN: INTRAVENOUS | Status: DC

## 2021-10-21 MED ORDER — SUGAMMADEX SODIUM 200 MG/2ML IV SOLN
INTRAVENOUS | Status: DC | PRN
  Administered 2021-10-21: 120 mg via INTRAVENOUS

## 2021-10-21 MED ORDER — INDOCYANINE GREEN 25 MG IV SOLR
INTRAVENOUS | Status: DC | PRN
  Administered 2021-10-21: 5 mg via INTRAVENOUS

## 2021-10-21 MED ORDER — MORPHINE SULFATE (PF) 4 MG/ML IV SOLN: 4.0000 mg | INTRAVENOUS | Status: DC | PRN

## 2021-10-21 MED ORDER — CELECOXIB 200 MG PO CAPS
200.0000 mg | ORAL_CAPSULE | Freq: Two times a day (BID) | ORAL | Status: DC
  Administered 2021-10-21 – 2021-10-24 (×6): 200 mg via ORAL
  Filled 2021-10-21 (×7): qty 1

## 2021-10-21 MED ORDER — BUPIVACAINE LIPOSOME 1.3 % IJ SUSP
INTRAMUSCULAR | Status: DC | PRN
  Administered 2021-10-21: 20 mL

## 2021-10-21 MED ORDER — PHENYLEPHRINE HCL (PRESSORS) 10 MG/ML IV SOLN
INTRAVENOUS | Status: DC | PRN
Start: 2021-10-21 — End: 2021-10-21
  Administered 2021-10-21: 80 ug via INTRAVENOUS
  Administered 2021-10-21: 160 ug via INTRAVENOUS
  Administered 2021-10-21 (×4): 80 ug via INTRAVENOUS

## 2021-10-21 MED ORDER — FENTANYL CITRATE (PF) 100 MCG/2ML IJ SOLN
INTRAMUSCULAR | Status: AC
  Filled 2021-10-21: qty 2

## 2021-10-21 MED ORDER — BUPIVACAINE LIPOSOME 1.3 % IJ SUSP: 20.0000 mL | Freq: Once | INTRAMUSCULAR | Status: DC

## 2021-10-21 MED ORDER — ACETAMINOPHEN 10 MG/ML IV SOLN: 1000.0000 mg | Freq: Once | INTRAVENOUS | Status: DC | PRN

## 2021-10-21 MED ORDER — GABAPENTIN 300 MG PO CAPS: 300.0000 mg | ORAL_CAPSULE | ORAL | Status: AC

## 2021-10-21 MED ORDER — BUPIVACAINE-EPINEPHRINE (PF) 0.25% -1:200000 IJ SOLN
INTRAMUSCULAR | Status: DC | PRN
  Administered 2021-10-21: 30 mL

## 2021-10-21 MED ORDER — METOPROLOL TARTRATE 25 MG PO TABS
25.0000 mg | ORAL_TABLET | Freq: Two times a day (BID) | ORAL | Status: DC
  Administered 2021-10-21 – 2021-10-30 (×18): 25 mg via ORAL
  Filled 2021-10-21 (×18): qty 1

## 2021-10-21 MED ORDER — SODIUM CHLORIDE (PF) 0.9 % IJ SOLN
INTRAMUSCULAR | Status: AC
  Filled 2021-10-21: qty 50

## 2021-10-21 MED ORDER — ROCURONIUM BROMIDE 100 MG/10ML IV SOLN
INTRAVENOUS | Status: DC | PRN
Start: 2021-10-21 — End: 2021-10-21
  Administered 2021-10-21: 40 mg via INTRAVENOUS
  Administered 2021-10-21: 20 mg via INTRAVENOUS
  Administered 2021-10-21: 30 mg via INTRAVENOUS
  Administered 2021-10-21 (×2): 20 mg via INTRAVENOUS

## 2021-10-21 MED ORDER — ALVIMOPAN 12 MG PO CAPS
ORAL_CAPSULE | ORAL | Status: AC
  Administered 2021-10-21: 12 mg via ORAL
  Filled 2021-10-21: qty 1

## 2021-10-21 MED ORDER — CELECOXIB 200 MG PO CAPS: 200.0000 mg | ORAL_CAPSULE | ORAL | Status: AC

## 2021-10-21 MED ORDER — PHENYLEPHRINE HCL-NACL 20-0.9 MG/250ML-% IV SOLN
INTRAVENOUS | Status: DC | PRN
  Administered 2021-10-21: 25 ug/min via INTRAVENOUS

## 2021-10-21 MED ORDER — CHLORHEXIDINE GLUCONATE 0.12 % MT SOLN: 15.0000 mL | Freq: Once | OROMUCOSAL | Status: AC

## 2021-10-21 MED ORDER — ALBUMIN HUMAN 5 % IV SOLN: INTRAVENOUS | Status: DC | PRN

## 2021-10-21 MED ORDER — ONDANSETRON 4 MG PO TBDP: 4.0000 mg | ORAL_TABLET | Freq: Four times a day (QID) | ORAL | Status: DC | PRN

## 2021-10-21 MED ORDER — EPHEDRINE SULFATE (PRESSORS) 50 MG/ML IJ SOLN
INTRAMUSCULAR | Status: DC | PRN
  Administered 2021-10-21: 10 mg via INTRAVENOUS

## 2021-10-21 MED ORDER — HYDROCODONE-ACETAMINOPHEN 5-325 MG PO TABS
1.0000 | ORAL_TABLET | ORAL | Status: DC | PRN
  Administered 2021-10-23: 1 via ORAL
  Filled 2021-10-21: qty 1

## 2021-10-21 MED ORDER — DEXAMETHASONE SODIUM PHOSPHATE 10 MG/ML IJ SOLN
INTRAMUSCULAR | Status: DC | PRN
Start: 2021-10-21 — End: 2021-10-21
  Administered 2021-10-21: 10 mg via INTRAVENOUS

## 2021-10-21 MED ORDER — SODIUM CHLORIDE 0.9 % IV SOLN
INTRAVENOUS | Status: AC
  Filled 2021-10-21: qty 2

## 2021-10-21 MED ORDER — ALVIMOPAN 12 MG PO CAPS
12.0000 mg | ORAL_CAPSULE | Freq: Two times a day (BID) | ORAL | Status: DC
  Administered 2021-10-22 – 2021-10-23 (×4): 12 mg via ORAL
  Filled 2021-10-21 (×7): qty 1

## 2021-10-21 MED ORDER — ENOXAPARIN SODIUM 40 MG/0.4ML IJ SOSY
40.0000 mg | PREFILLED_SYRINGE | INTRAMUSCULAR | Status: DC
  Administered 2021-10-22 – 2021-10-29 (×8): 40 mg via SUBCUTANEOUS
  Filled 2021-10-21 (×9): qty 0.4

## 2021-10-21 MED ORDER — HYDROMORPHONE HCL 1 MG/ML IJ SOLN: 0.2500 mg | INTRAMUSCULAR | Status: DC | PRN

## 2021-10-21 MED ORDER — PROPOFOL 10 MG/ML IV BOLUS
INTRAVENOUS | Status: DC | PRN
  Administered 2021-10-21: 100 mg via INTRAVENOUS

## 2021-10-21 MED ORDER — GABAPENTIN 300 MG PO CAPS
ORAL_CAPSULE | ORAL | Status: AC
  Administered 2021-10-21: 300 mg via ORAL
  Filled 2021-10-21: qty 1

## 2021-10-21 MED ORDER — LIDOCAINE HCL (CARDIAC) PF 100 MG/5ML IV SOSY
PREFILLED_SYRINGE | INTRAVENOUS | Status: DC | PRN
  Administered 2021-10-21: 60 mg via INTRAVENOUS
  Administered 2021-10-21: 40 mg via INTRAVENOUS

## 2021-10-21 MED ORDER — LIDOCAINE HCL (PF) 2 % IJ SOLN
INTRAMUSCULAR | Status: AC
  Filled 2021-10-21: qty 5

## 2021-10-21 MED ORDER — ALBUMIN HUMAN 5 % IV SOLN
INTRAVENOUS | Status: AC
  Filled 2021-10-21: qty 250

## 2021-10-21 MED ORDER — SODIUM CHLORIDE 0.9 % IR SOLN
Status: DC | PRN
  Administered 2021-10-21: 100 mL

## 2021-10-21 MED ORDER — SODIUM CHLORIDE 0.9 % IV SOLN
2.0000 g | INTRAVENOUS | Status: AC
  Administered 2021-10-21: 2 g via INTRAVENOUS

## 2021-10-21 MED ORDER — PHENYLEPHRINE HCL-NACL 20-0.9 MG/250ML-% IV SOLN
INTRAVENOUS | Status: AC
  Filled 2021-10-21: qty 250

## 2021-10-21 MED ORDER — ONDANSETRON HCL 4 MG/2ML IJ SOLN
INTRAMUSCULAR | Status: DC | PRN
  Administered 2021-10-21: 4 mg via INTRAVENOUS

## 2021-10-21 MED ORDER — ROCURONIUM BROMIDE 10 MG/ML (PF) SYRINGE
PREFILLED_SYRINGE | INTRAVENOUS | Status: AC
  Filled 2021-10-21: qty 10

## 2021-10-21 MED ORDER — DROPERIDOL 2.5 MG/ML IJ SOLN: 0.6250 mg | Freq: Once | INTRAMUSCULAR | Status: DC | PRN

## 2021-10-21 MED ORDER — CELECOXIB 200 MG PO CAPS
ORAL_CAPSULE | ORAL | Status: AC
  Administered 2021-10-21: 200 mg via ORAL
  Filled 2021-10-21: qty 1

## 2021-10-21 MED ORDER — ALVIMOPAN 12 MG PO CAPS: 12.0000 mg | ORAL_CAPSULE | ORAL | Status: AC

## 2021-10-21 MED ORDER — BUPIVACAINE LIPOSOME 1.3 % IJ SUSP
INTRAMUSCULAR | Status: AC
  Filled 2021-10-21: qty 20

## 2021-10-21 MED ORDER — BUPIVACAINE-EPINEPHRINE (PF) 0.25% -1:200000 IJ SOLN
INTRAMUSCULAR | Status: AC
  Filled 2021-10-21: qty 30

## 2021-10-21 MED ORDER — CHLORHEXIDINE GLUCONATE 0.12 % MT SOLN
OROMUCOSAL | Status: AC
  Administered 2021-10-21: 15 mL via OROMUCOSAL
  Filled 2021-10-21: qty 15

## 2021-10-21 MED ORDER — GABAPENTIN 300 MG PO CAPS
300.0000 mg | ORAL_CAPSULE | Freq: Two times a day (BID) | ORAL | Status: DC
  Administered 2021-10-21 – 2021-10-24 (×6): 300 mg via ORAL
  Filled 2021-10-21 (×7): qty 1

## 2021-10-21 MED ORDER — ACETAMINOPHEN 500 MG PO TABS
ORAL_TABLET | ORAL | Status: AC
  Administered 2021-10-21: 1000 mg via ORAL
  Filled 2021-10-21: qty 2

## 2021-10-21 MED ORDER — FENTANYL CITRATE (PF) 100 MCG/2ML IJ SOLN
INTRAMUSCULAR | Status: DC | PRN
  Administered 2021-10-21 (×2): 50 ug via INTRAVENOUS
  Administered 2021-10-21 (×2): 25 ug via INTRAVENOUS
  Administered 2021-10-21 (×2): 50 ug via INTRAVENOUS

## 2021-10-21 MED ORDER — EPHEDRINE 5 MG/ML INJ
INTRAVENOUS | Status: AC
  Filled 2021-10-21: qty 5

## 2021-10-21 MED ORDER — ORAL CARE MOUTH RINSE: 15.0000 mL | Freq: Once | OROMUCOSAL | Status: AC

## 2021-10-21 MED ORDER — LACTATED RINGERS IV SOLN: INTRAVENOUS | Status: DC

## 2021-10-21 SURGICAL SUPPLY — 105 items
BLADE CLIPPER SURG (BLADE) ×1 IMPLANT
BLADE SURG SZ10 CARB STEEL (BLADE) ×3 IMPLANT
BLADE SURG SZ11 CARB STEEL (BLADE) ×3 IMPLANT
CANNULA REDUC XI 12-8 STAPL (CANNULA) ×1
CANNULA REDUCER 12-8 DVNC XI (CANNULA) ×2 IMPLANT
COVER TIP SHEARS 8 DVNC (MISCELLANEOUS) ×2 IMPLANT
COVER TIP SHEARS 8MM DA VINCI (MISCELLANEOUS) ×1
DERMABOND ADVANCED (GAUZE/BANDAGES/DRESSINGS) ×1
DERMABOND ADVANCED .7 DNX12 (GAUZE/BANDAGES/DRESSINGS) ×2 IMPLANT
DRAPE ARM DVNC X/XI (DISPOSABLE) ×8 IMPLANT
DRAPE COLUMN DVNC XI (DISPOSABLE) ×2 IMPLANT
DRAPE DA VINCI XI ARM (DISPOSABLE) ×4
DRAPE DA VINCI XI COLUMN (DISPOSABLE) ×1
DRAPE INCISE IOBAN 66X45 STRL (DRAPES) ×3 IMPLANT
DRAPE LEGGINS SURG 28X43 STRL (DRAPES) ×3 IMPLANT
DRAPE UNDER BUTTOCK W/FLU (DRAPES) ×3 IMPLANT
DRSG OPSITE POSTOP 3X4 (GAUZE/BANDAGES/DRESSINGS) ×5 IMPLANT
DRSG OPSITE POSTOP 4X10 (GAUZE/BANDAGES/DRESSINGS) IMPLANT
DRSG OPSITE POSTOP 4X8 (GAUZE/BANDAGES/DRESSINGS) IMPLANT
ELECT BLADE 6.5 EXT (BLADE) IMPLANT
ELECT CAUTERY BLADE 6.4 (BLADE) ×3 IMPLANT
ELECT REM PT RETURN 9FT ADLT (ELECTROSURGICAL) ×3
ELECTRODE REM PT RTRN 9FT ADLT (ELECTROSURGICAL) ×2 IMPLANT
GAUZE PACKING IODOFORM 1/2 (PACKING) ×1 IMPLANT
GLOVE BIO SURGEON STRL SZ 6.5 (GLOVE) ×13 IMPLANT
GLOVE BIOGEL PI IND STRL 6.5 (GLOVE) ×6 IMPLANT
GLOVE BIOGEL PI INDICATOR 6.5 (GLOVE) ×7
GOWN STRL REUS W/ TWL LRG LVL3 (GOWN DISPOSABLE) ×12 IMPLANT
GOWN STRL REUS W/TWL LRG LVL3 (GOWN DISPOSABLE) ×7
GRASPER SUT TROCAR 14GX15 (MISCELLANEOUS) ×3 IMPLANT
HANDLE YANKAUER SUCT BULB TIP (MISCELLANEOUS) ×2 IMPLANT
IRRIGATION STRYKERFLOW (MISCELLANEOUS) IMPLANT
IRRIGATOR STRYKERFLOW (MISCELLANEOUS)
IRRIGATOR SUCT 8 DISP DVNC XI (IRRIGATION / IRRIGATOR) IMPLANT
IRRIGATOR SUCTION 8MM XI DISP (IRRIGATION / IRRIGATOR) ×1
IV NS 1000ML (IV SOLUTION) ×1
IV NS 1000ML BAXH (IV SOLUTION) IMPLANT
KIT IMAGING PINPOINTPAQ (MISCELLANEOUS) ×3 IMPLANT
KIT PINK PAD W/HEAD ARE REST (MISCELLANEOUS) ×3
KIT PINK PAD W/HEAD ARM REST (MISCELLANEOUS) ×2 IMPLANT
KIT SIGMOIDOSCOPE (SET/KITS/TRAYS/PACK) ×1 IMPLANT
L-HOOK LAP DISP 36CM (ELECTROSURGICAL) ×3
LABEL OR SOLS (LABEL) ×3 IMPLANT
LHOOK LAP DISP 36CM (ELECTROSURGICAL) IMPLANT
MANIFOLD NEPTUNE II (INSTRUMENTS) ×3 IMPLANT
NDL INSUFFLATION 14GA 120MM (NEEDLE) ×2 IMPLANT
NEEDLE HYPO 22GX1.5 SAFETY (NEEDLE) ×3 IMPLANT
NEEDLE INSUFFLATION 14GA 120MM (NEEDLE) ×3 IMPLANT
OBTURATOR OPTICAL STANDARD 8MM (TROCAR) ×1
OBTURATOR OPTICAL STND 8 DVNC (TROCAR) ×2
OBTURATOR OPTICALSTD 8 DVNC (TROCAR) ×2 IMPLANT
PACK COLON CLEAN CLOSURE (MISCELLANEOUS) ×3 IMPLANT
PACK LAP CHOLECYSTECTOMY (MISCELLANEOUS) ×3 IMPLANT
PENCIL ELECTRO HAND CTR (MISCELLANEOUS) ×3 IMPLANT
PORT ACCESS TROCAR AIRSEAL 5 (TROCAR) ×3 IMPLANT
RELOAD STAPLE 45 3.5 BLU DVNC (STAPLE) IMPLANT
RELOAD STAPLE 60 3.5 BLU DVNC (STAPLE) IMPLANT
RELOAD STAPLER 3.5X45 BLU DVNC (STAPLE) IMPLANT
RELOAD STAPLER 3.5X60 BLU DVNC (STAPLE) ×2 IMPLANT
RETRACTOR WOUND ALXS 18CM SML (MISCELLANEOUS) IMPLANT
RTRCTR WOUND ALEXIS O 18CM SML (MISCELLANEOUS)
SEAL CANN UNIV 5-8 DVNC XI (MISCELLANEOUS) ×6 IMPLANT
SEAL XI 5MM-8MM UNIVERSAL (MISCELLANEOUS) ×3
SEALER VESSEL DA VINCI XI (MISCELLANEOUS) ×1
SEALER VESSEL EXT DVNC XI (MISCELLANEOUS) IMPLANT
SET TRI-LUMEN FLTR TB AIRSEAL (TUBING) ×3 IMPLANT
SOL PREP PVP 2OZ (MISCELLANEOUS) ×3
SOLUTION ELECTROLUBE (MISCELLANEOUS) ×3 IMPLANT
SOLUTION PREP PVP 2OZ (MISCELLANEOUS) ×2 IMPLANT
SPONGE T-LAP 18X18 ~~LOC~~+RFID (SPONGE) ×3 IMPLANT
SPONGE T-LAP 4X18 ~~LOC~~+RFID (SPONGE) ×3 IMPLANT
STAPLER 45 DA VINCI SURE FORM (STAPLE)
STAPLER 45 SUREFORM DVNC (STAPLE) IMPLANT
STAPLER 60 DA VINCI SURE FORM (STAPLE) ×1
STAPLER 60 SUREFORM DVNC (STAPLE) IMPLANT
STAPLER CANNULA SEAL DVNC XI (STAPLE) ×2 IMPLANT
STAPLER CANNULA SEAL XI (STAPLE) ×1
STAPLER CIRCULAR MANUAL XL 29 (STAPLE) ×1 IMPLANT
STAPLER RELOAD 3.5X45 BLU DVNC (STAPLE)
STAPLER RELOAD 3.5X45 BLUE (STAPLE)
STAPLER RELOAD 3.5X60 BLU DVNC (STAPLE) ×2
STAPLER RELOAD 3.5X60 BLUE (STAPLE) ×1
STAPLER RELOADABLE 65 2-0 SUT (MISCELLANEOUS) IMPLANT
STAPLER SYS INTERNAL RELOAD SS (MISCELLANEOUS) ×3 IMPLANT
SURGILUBE 2OZ TUBE FLIPTOP (MISCELLANEOUS) ×3 IMPLANT
SUT MNCRL 4-0 (SUTURE)
SUT MNCRL 4-0 27XMFL (SUTURE)
SUT PDS PLUS 0 (SUTURE)
SUT PDS PLUS AB 0 CT-2 (SUTURE) ×4 IMPLANT
SUT PROLENE 2 0 SH DA (SUTURE) ×1 IMPLANT
SUT SILK 0 SH 30 (SUTURE) ×4 IMPLANT
SUT STRATAFIX 0 PDS+ CT-2 23 (SUTURE) ×9
SUT VIC AB 2-0 SH 27 (SUTURE) ×1
SUT VIC AB 2-0 SH 27XBRD (SUTURE) IMPLANT
SUT VIC AB 3-0 SH 27 (SUTURE)
SUT VIC AB 3-0 SH 27X BRD (SUTURE) ×8 IMPLANT
SUT VICRYL 0 AB UR-6 (SUTURE) ×5 IMPLANT
SUT VLOC 90 6 CV-15 VIOLET (SUTURE) IMPLANT
SUTURE MNCRL 4-0 27XMF (SUTURE) ×4 IMPLANT
SUTURE STRATFX 0 PDS+ CT-2 23 (SUTURE) IMPLANT
SYR 30ML LL (SYRINGE) ×3 IMPLANT
SYS LAPSCP GELPORT 120MM (MISCELLANEOUS) ×3
SYSTEM LAPSCP GELPORT 120MM (MISCELLANEOUS) IMPLANT
TRAY FOLEY MTR SLVR 16FR STAT (SET/KITS/TRAYS/PACK) ×3 IMPLANT
WATER STERILE IRR 500ML POUR (IV SOLUTION) ×3 IMPLANT

## 2021-10-21 NOTE — Anesthesia Procedure Notes (Signed)
Procedure Name: Intubation Date/Time: 10/21/2021 9:15 AM Performed by: Jonna Clark, CRNA Pre-anesthesia Checklist: Patient identified, Patient being monitored, Timeout performed, Emergency Drugs available and Suction available Patient Re-evaluated:Patient Re-evaluated prior to induction Oxygen Delivery Method: Circle system utilized Preoxygenation: Pre-oxygenation with 100% oxygen Induction Type: IV induction Ventilation: Mask ventilation without difficulty Laryngoscope Size: Mac and 3 Grade View: Grade II Tube type: Oral Tube size: 6.5 mm Number of attempts: 1 Placement Confirmation: ETT inserted through vocal cords under direct vision, positive ETCO2 and breath sounds checked- equal and bilateral Secured at: 20 cm Tube secured with: Tape Dental Injury: Teeth and Oropharynx as per pre-operative assessment

## 2021-10-21 NOTE — Op Note (Signed)
Preoperative diagnosis: Colostomy status  Postoperative diagnosis: Same  Procedure: Robotic assisted laparoscopic converted to hand-assisted laparoscopic colostomy reversal.   Robotic assisted laparoscopic extensive lysis of adhesions  Laparoscopic, hand-assisted splenic flexure mobilization   Anesthesia: GETA   Surgeon: Herbert Pun, MD  Assistant: Dr. Lysle Pearl   Wound Classification: Clean contaminated   Specimen: Colostomy                     Rectum   Complications: None   Estimated Blood Loss: 50 mL   Indications: Patient is a 76 y.o. female with history of perforated diverticulitis which was treated with Hartman's procedure.  Patient has been recovering well.  Elective colostomy reversal was indicated.   FIndings: 1.  Abundant amount of adhesions to the whole abdomen 2.  Extensive lysis of additions needed to be able to return structures normal anatomy and visualize descending colon and rectum. 3.  Adequate hemostasis.  4.  No gross metastasis noted   Description of procedure: The patient was placed on the operating table in the lithotomy position, both arms tucked. General anesthesia was induced. A time-out was completed verifying correct patient, procedure, site, positioning, and implant(s) and/or special equipment prior to beginning this procedure. The abdomen was prepped and draped in the usual sterile fashion.    A Veress needle was inserted on Palmer's point.  Abdominal cavity was insufflated to 15 mmHg. Patient tolerated insufflation well.  An 8 mm port was inserted in an Optiview fashion in the left upper quadrant.  Two additional 8 mm ports were placed through the large side of the abdomen.  Forced trocar in the right lower quadrant was not able to be placed at this moment due to colon adhered to right lower quadrant.  With monopolar scissors and force bipolar abundant lysis of adhesions of the abdomen was needed to be done.  The cecum was mobilized from the  right lower quadrant.  Small bowel and transverse colon was mobilized from the midline incision.  Once the small bowel and portion of the large intestine was completely mobilized from the abdominal wall and right side of the abdomen, I scrubbed back.  There was undocked.  Under direct visualization the 12 mm trocar was able to place in the right lower quadrant.  Then I proceeded to take down the colostomy.  A circular incision was done around the mucocutaneous borders.  Dissection was carried down to fascia.  The colostomy was able to be mobilized from the fascia.  The descending colon was dilated with Hegar dilators.  A 29 mm anvil was placed in the descending colon and fixed with pursestring device.  The descending colon was returned to the abdominal cavity.  Temporary closure of the skin of the colostomy wound with 2-0 Prolene.  Then again, the abdominal cavity was insufflated.  The robot was docked again.  Initially the rectum with the 0 Prolene was not visualized.  Dissection into the pelvis was needed to be done to found the problem.  1 staple line was identified the rectum was dissected circumferentially.  An abscess was identified and drainage.  Chronic purulence was easily suctioned.  Then the descending colon was mobilized from the lateral attachments up to the splenic flexure.  This female patient was taken down to be able to reach to the pelvis without tension.  At this point 5 mg of ICG green was flushed intravenously and the site of adequate vascularity of the colonic was identified.  The rectum was divided  with stapler, blue load.  Due to difficulty getting the descending colon without tension to the pelvis and not progressing robotically, I decided to convert the surgery to hand-assisted.  I scrubbed back and made an incision in the midline.  Dissection was taken down to the fascia.  Abdominal cavity was entered.  A GelPort was inserted.  With the use of my hand I was able to mobilize more of  the lateral attachments up to the splenic flexure.  At this point I felt comfortable with the descending colon getting into the pelvis without tension.  Once the descending colon was identified reaching the rectum without tension, the assistant surgeon introduced the 29 mm EEA rectally. It was guided under direct vision up to the level of the distal staple line on the rectal stump. The spike of the EEA device was then deployed to pierce the rectal stump, the anvil was then attached to this spike, and the EEA device is closed and fired to perform a stapled end-to-end anastomosis. The doughnuts produced by the EEA stapler were checked to ensure that they were complete. Furthermore, an air leak test was carried out by insufflating air gently into the rectum, while the anastomosis was bathed underwater. A clamp was placed on the proximal colon to prevent its distention. Once the anastomosis was properly tested, the patient was repositioned back in the normal anatomical position.  The trocars were removed under direct vision.   Using clean culture technique, the fascia of the colostomy wound was closed with a #0 STRATAFIX suture.  The midline fascia was also closed with #0 STRATAFIX.  The skin of the colostomy wound was partially approximated with a pursestring with Vicryl.  The skin was closed with skin staples and a dry sterile dressing is applied. The sponge and instrument count were correct, blood loss was minimal, and there were no complications.    The patient tolerated the procedure well, awakened from anesthesia and was taken to the postanesthesia care unit in satisfactory condition.  Foley still in place.  Sponge count and instrument count correct at the end of the procedure.

## 2021-10-21 NOTE — Interval H&P Note (Signed)
History and Physical Interval Note:  10/21/2021 8:44 AM  Mary Washington  has presented today for surgery, with the diagnosis of Z93.3 Colostomy Status.  The various methods of treatment have been discussed with the patient and family. After consideration of risks, benefits and other options for treatment, the patient has consented to  Procedure(s): XI ROBOTIC ASSISTED COLOSTOMY TAKEDOWN (N/A) as a surgical intervention.  The patient's history has been reviewed, patient examined, no change in status, stable for surgery.  I have reviewed the patient's chart and labs.  Questions were answered to the patient's satisfaction.     Herbert Pun

## 2021-10-21 NOTE — Transfer of Care (Signed)
Immediate Anesthesia Transfer of Care Note  Patient: Mary Washington  Procedure(s) Performed: XI ROBOTIC ASSISTED COLOSTOMY TAKEDOWN (Abdomen) LAPAROSCOPIC LYSIS OF ADHESIONS  Patient Location: PACU  Anesthesia Type:General  Level of Consciousness: drowsy and patient cooperative  Airway & Oxygen Therapy: Spontaneous breathing with Declo  Post-op Assessment: Report given to RN and Post -op Vital signs reviewed and stable  Post vital signs: Reviewed and stable  Last Vitals:  Vitals Value Taken Time  BP 112/49 10/21/21 1500  Temp 36.1 C 10/21/21 1455  Pulse 96 10/21/21 1506  Resp 16 10/21/21 1506  SpO2 98 % 10/21/21 1506  Vitals shown include unvalidated device data.  Last Pain:  Vitals:   10/21/21 1500  TempSrc:   PainSc: Asleep         Complications: No notable events documented.

## 2021-10-21 NOTE — Anesthesia Preprocedure Evaluation (Addendum)
Anesthesia Evaluation  Patient identified by MRN, date of birth, ID band Patient awake    Reviewed: Allergy & Precautions, H&P , NPO status , Patient's Chart, lab work & pertinent test results, reviewed documented beta blocker date and time   History of Anesthesia Complications (+) history of anesthetic complications (bp dropped with novacaine at dentist office and pt had to be admitted to hospital for 24 hours)  Airway Mallampati: II   Neck ROM: full    Dental  (+) Chipped   Pulmonary neg shortness of breath, asthma ,    Pulmonary exam normal        Cardiovascular Exercise Tolerance: Poor Normal cardiovascular exam+ dysrhythmias (LBBB)  Rhythm:regular Rate:Normal     Neuro/Psych negative neurological ROS  negative psych ROS   GI/Hepatic Neg liver ROS, GERD  Medicated,  Endo/Other  negative endocrine ROS  Renal/GU negative Renal ROS  negative genitourinary   Musculoskeletal  (+) Arthritis ,   Abdominal Normal abdominal exam  (+)   Peds  Hematology  (+) Blood dyscrasia, anemia , 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   Anesthesia Other Findings Past Medical History: No date: Colon polyps No date: Diverticulosis 1988: Melanoma ([place])     Comment:  lt upper arm, some lymph nodes removed No date: Mild intermittent asthma No date: Osteoporosis Past Surgical History: No date: BACK SURGERY No date: CATARACT EXTRACTION 11/23/2020: IR IMAGING GUIDED PORT INSERTION 08/12/2021: IR KYPHO THORACIC WITH BONE BIOPSY 07/23/2021: IR RADIOLOGIST EVAL & MGMT 09/11/2021: IR RADIOLOGIST EVAL & MGMT 04/24/2021: PARTIAL COLECTOMY; N/A     Comment:  Procedure: PARTIAL COLECTOMY;  Surgeon: Herbert Pun, MD;  Location: [facility] ORS;  Service: General;                Laterality: N/A; No date: TONSILLECTOMY 1990: VAGINAL HYSTERECTOMY   Reproductive/Obstetrics negative OB ROS                             Anesthesia Physical  Anesthesia Plan  ASA: 3  Anesthesia Plan: General   Post-op Pain Management: Celebrex PO (pre-op)*, Tylenol PO (pre-op)* and Gabapentin PO (pre-op)*   Induction: Intravenous  PONV Risk Score and Plan: 3 and Ondansetron, Dexamethasone and Treatment may vary due to age or medical condition  Airway Management Planned: Oral ETT  Additional Equipment:   Intra-op Plan:   Post-operative Plan: Extubation in OR  Informed Consent: I have reviewed the patients History and Physical, chart, labs and discussed the procedure including the risks, benefits and alternatives for the proposed anesthesia with the patient or authorized representative who has indicated his/her understanding and acceptance.     Dental Advisory Given  Plan Discussed with: CRNA  Anesthesia Plan Comments:        Anesthesia Quick Evaluation

## 2021-10-22 ENCOUNTER — Encounter: Payer: Self-pay | Admitting: General Surgery

## 2021-10-22 LAB — BASIC METABOLIC PANEL
Anion gap: 7 (ref 5–15)
BUN: 11 mg/dL (ref 8–23)
CO2: 25 mmol/L (ref 22–32)
Calcium: 8.5 mg/dL — ABNORMAL LOW (ref 8.9–10.3)
Chloride: 99 mmol/L (ref 98–111)
Creatinine, Ser: 0.71 mg/dL (ref 0.44–1.00)
GFR, Estimated: >60 mL/min (ref >60)
Glucose, Bld: 99 mg/dL (ref 70–99)
Potassium: 3.9 mmol/L (ref 3.5–5.1)
Sodium: 131 mmol/L — ABNORMAL LOW (ref 135–145)

## 2021-10-22 LAB — CBC
HCT: 37 % (ref 36.0–46.0)
Hemoglobin: 11.8 g/dL — ABNORMAL LOW (ref 12.0–15.0)
MCH: 26 pg (ref 26.0–34.0)
MCHC: 31.9 g/dL (ref 30.0–36.0)
MCV: 81.7 fL (ref 80.0–100.0)
Platelets: 271 10*3/uL (ref 150–400)
RBC: 4.53 MIL/uL (ref 3.87–5.11)
RDW: 15 % (ref 11.5–15.5)
WBC: 7.4 10*3/uL (ref 4.0–10.5)
nRBC: 0 % (ref 0.0–0.2)

## 2021-10-22 LAB — MAGNESIUM: Magnesium: 1.7 mg/dL (ref 1.7–2.4)

## 2021-10-22 LAB — PHOSPHORUS: Phosphorus: 3.2 mg/dL (ref 2.5–4.6)

## 2021-10-22 MED ORDER — CHLORHEXIDINE GLUCONATE CLOTH 2 % EX PADS
6.0000 | MEDICATED_PAD | Freq: Every day | CUTANEOUS | Status: DC
  Administered 2021-10-22 – 2021-10-29 (×8): 6 via TOPICAL

## 2021-10-22 NOTE — Plan of Care (Signed)
Pt sat on edge of bed for approx 10 min this am and tolerated well. Pt has not required any prn pain medications. SCD in place.  Problem: Education: Goal: Knowledge of General Education information will improve Description: Including pain rating scale, medication(s)/side effects and non-pharmacologic comfort measures Outcome: Progressing   Problem: Clinical Measurements: Goal: Ability to maintain clinical measurements within normal limits will improve Outcome: Progressing Goal: Will remain free from infection Outcome: Progressing Goal: Diagnostic test results will improve Outcome: Progressing Goal: Respiratory complications will improve Outcome: Progressing Goal: Cardiovascular complication will be avoided Outcome: Progressing   Problem: Activity: Goal: Risk for activity intolerance will decrease Outcome: Progressing   Problem: Nutrition: Goal: Adequate nutrition will be maintained Outcome: Progressing   Problem: Coping: Goal: Level of anxiety will decrease Outcome: Progressing   Problem: Elimination: Goal: Will not experience complications related to bowel motility Outcome: Progressing Goal: Will not experience complications related to urinary retention Outcome: Progressing   Problem: Pain Managment: Goal: General experience of comfort will improve Outcome: Progressing   Problem: Safety: Goal: Ability to remain free from injury will improve Outcome: Progressing   Problem: Skin Integrity: Goal: Risk for impaired skin integrity will decrease Outcome: Progressing

## 2021-10-22 NOTE — Anesthesia Postprocedure Evaluation (Signed)
Anesthesia Post Note  Patient: Clinical biochemist  Procedure(s) Performed: XI ROBOTIC ASSISTED COLOSTOMY TAKEDOWN (Abdomen) LAPAROSCOPIC LYSIS OF ADHESIONS  Patient location during evaluation: PACU Anesthesia Type: General Level of consciousness: awake and alert Pain management: pain level controlled Vital Signs Assessment: post-procedure vital signs reviewed and stable Respiratory status: spontaneous breathing, nonlabored ventilation and respiratory function stable Cardiovascular status: blood pressure returned to baseline and stable Postop Assessment: no apparent nausea or vomiting Anesthetic complications: no   No notable events documented.   Last Vitals:  Vitals:   10/22/21 0545 10/22/21 0742  BP: (!) 117/52 (!) 116/47  Pulse: 86 76  Resp: 20   Temp: 36.7 C 36.5 C  SpO2: 96% 97%    Last Pain:  Vitals:   10/22/21 0742  TempSrc: Oral  PainSc:                  [place] Ouch

## 2021-10-22 NOTE — Progress Notes (Signed)
Patient ID: Mary Washington, female   DOB: May 24, 1946, 76 y.o.   MRN: [id number]     [place] Hospital Day(s): 1.   Interval History: Patient seen and examined, no acute events or new complaints overnight. Patient reports feeling sore..  Pain controlled with current pain medications.  Denies nausea or vomiting. Still not passing gas.   Vital signs in last 24 hours: [min-max] current  Temp:  [97.7 F (36.5 C)-98.4 F (36.9 C)] 98.3 F (36.8 C) (05/23 1454) Pulse Rate:  [72-90] 72 (05/23 1454) Resp:  [16-20] 16 (05/23 1454) BP: (110-133)/(47-98) 110/52 (05/23 1454) SpO2:  [96 %-100 %] 100 % (05/23 1454) Weight:  [52 kg] 52 kg (05/22 1828)     Height: '4\' 11"'$  (149.9 cm) Weight: 52 kg BMI (Calculated): 23.14   Physical Exam:  Constitutional: alert, cooperative and no distress  Respiratory: breathing non-labored at rest  Cardiovascular: regular rate and sinus rhythm  Gastrointestinal: soft, non-tender, and non-distended  Labs:     Latest Ref Rng & Units 10/22/2021    5:35 AM 10/08/2021    1:27 PM 08/12/2021    8:11 AM  CBC  WBC 4.0 - 10.5 K/uL 7.4   8.3   6.2    Hemoglobin 12.0 - 15.0 g/dL 11.8   13.6   14.4    Hematocrit 36.0 - 46.0 % 37.0   41.5   45.7    Platelets 150 - 400 K/uL 271   309   308        Latest Ref Rng & Units 10/22/2021    5:35 AM 10/17/2021    1:40 PM 10/08/2021    1:27 PM  CMP  Glucose 70 - 99 mg/dL 99   103   135    BUN 8 - 23 mg/dL '11   16   22    '$ Creatinine 0.44 - 1.00 mg/dL 0.71   0.67   0.75    Sodium 135 - 145 mmol/L 131   135   128    Potassium 3.5 - 5.1 mmol/L 3.9   4.4   4.1    Chloride 98 - 111 mmol/L 99   99   95    CO2 22 - 32 mmol/L '25   27   24    '$ Calcium 8.9 - 10.3 mg/dL 8.5   9.4   8.9    Total Protein 6.5 - 8.1 g/dL   6.3    Total Bilirubin 0.3 - 1.2 mg/dL   0.6    Alkaline Phos 38 - 126 U/L   106    AST 15 - 41 U/L   23    ALT 0 - 44 U/L   15      Imaging studies: No new pertinent imaging studies   Assessment/Plan:  76  y.o. female with history of colostomy status 1 Day Post-Op s/p robotic assisted laparoscopic colostomy reversal   - Stable vital signs   - Still not passing gas. Will keep in clear liquid diet   - Continue pain management   - Discontinue foley.     [name], MD

## 2021-10-22 NOTE — Progress Notes (Signed)
Mobility Specialist - Progress Note   10/22/21 1500  Mobility  Activity Ambulated independently in hallway  Level of Assistance Independent  Assistive Device None  Distance Ambulated (ft) 160 ft  Activity Response Tolerated well  $Mobility charge 1 Mobility     Pt ambulated in hallway with modI. Soreness at incisional site 5/10. No other complaints. Pt returned to bed with needs in reach.    Kathee Delton Mobility Specialist 10/22/21, 3:37 PM

## 2021-10-22 NOTE — TOC Initial Note (Signed)
Transition of Care [facility]) - Initial/Assessment Note    Patient Details  Name: Mary Washington MRN: [id number] Date of Birth: [birth year]  Transition of [facility]) CM/SW Contact:    Conception Oms, RN Phone Number: 10/22/2021, 4:05 PM  Clinical Narrative:                  Patient from home with her spouse She was able to ambulate independently in the hallway 160 ft     PCP is [place] [facility]   Patient Goals and CMS Choice        Expected Discharge Plan and Services                                                Prior Living Arrangements/Services                       Activities of Daily Living Home Assistive Devices/Equipment: None ADL Screening (condition at time of admission) Patient's cognitive ability adequate to safely complete daily activities?: Yes Is the patient deaf or have difficulty hearing?: No Does the patient have difficulty seeing, even when wearing glasses/contacts?: No Does the patient have difficulty concentrating, remembering, or making decisions?: No Patient able to express need for assistance with ADLs?: Yes Does the patient have difficulty dressing or bathing?: No Independently performs ADLs?: Yes (appropriate for developmental age) Does the patient have difficulty walking or climbing stairs?: No Weakness of Legs: None Weakness of Arms/Hands: None  Permission Sought/Granted                  Emotional Assessment              Admission diagnosis:  Colostomy status [facility]) [Z93.3] Patient Active Problem List   Diagnosis Date Noted   Colostomy status ([place]) 10/21/2021   Colostomy in place [facility]) 05/20/2021   Acute diverticulitis 04/15/2021   Hyponatremia 04/15/2021   Antineoplastic chemotherapy induced pancytopenia ([place]) 04/15/2021   SBO (small bowel obstruction) ([place]) 04/15/2021   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 [place] [address] Phone: [phone] Fax: [phone]     Social Determinants of Health ([place]) Interventions    Readmission Risk Interventions    05/01/2021    2:11 PM  Readmission Risk Prevention Plan  PCP or Specialist Appt within 3-5 Days Complete  Social Work Consult for [place] Planning/Counseling Complete  Palliative Care Screening Not Applicable

## 2021-10-23 ENCOUNTER — Inpatient Hospital Stay: Payer: Medicare Other

## 2021-10-23 LAB — SURGICAL PATHOLOGY

## 2021-10-23 MED ORDER — PHENOL 1.4 % MT LIQD: 1.0000 | OROMUCOSAL | Status: DC | PRN

## 2021-10-23 NOTE — Progress Notes (Signed)
Patient ID: Mary Washington, female   DOB: 12-13-1945, 76 y.o.   MRN: [id number]     [place] Hospital Day(s): 2.   Interval History: Patient seen and examined, no acute events or new complaints overnight. Patient reports feeling bloated.  Endorses mild abdominal pain.  Endorses feeling nauseous.  Not passing gas.  Vital signs in last 24 hours: [min-max] current  Temp:  [97.5 F (36.4 C)-98.5 F (36.9 C)] 97.5 F (36.4 C) (05/24 1530) Pulse Rate:  [72-75] 72 (05/24 1530) Resp:  [18] 18 (05/24 1530) BP: (128-135)/(54-58) 135/56 (05/24 1530) SpO2:  [94 %-96 %] 96 % (05/24 1530)     Height: '4\' 11"'$  (149.9 cm) Weight: 52 kg BMI (Calculated): 23.14   Physical Exam:  Constitutional: alert, cooperative and no distress  Respiratory: breathing non-labored at rest  Cardiovascular: regular rate and sinus rhythm  Gastrointestinal: soft, non-tender, distended and tympanic  Labs:     Latest Ref Rng & Units 10/22/2021    5:35 AM 10/08/2021    1:27 PM 08/12/2021    8:11 AM  CBC  WBC 4.0 - 10.5 K/uL 7.4   8.3   6.2    Hemoglobin 12.0 - 15.0 g/dL 11.8   13.6   14.4    Hematocrit 36.0 - 46.0 % 37.0   41.5   45.7    Platelets 150 - 400 K/uL 271   309   308        Latest Ref Rng & Units 10/22/2021    5:35 AM 10/17/2021    1:40 PM 10/08/2021    1:27 PM  CMP  Glucose 70 - 99 mg/dL 99   103   135    BUN 8 - 23 mg/dL '11   16   22    '$ Creatinine 0.44 - 1.00 mg/dL 0.71   0.67   0.75    Sodium 135 - 145 mmol/L 131   135   128    Potassium 3.5 - 5.1 mmol/L 3.9   4.4   4.1    Chloride 98 - 111 mmol/L 99   99   95    CO2 22 - 32 mmol/L '25   27   24    '$ Calcium 8.9 - 10.3 mg/dL 8.5   9.4   8.9    Total Protein 6.5 - 8.1 g/dL   6.3    Total Bilirubin 0.3 - 1.2 mg/dL   0.6    Alkaline Phos 38 - 126 U/L   106    AST 15 - 41 U/L   23    ALT 0 - 44 U/L   15      Imaging studies: No new pertinent imaging studies   Assessment/Plan:  76 y.o. female with colostomy status 2 Days Post-Op s/p  colostomy reversal.  -Patient developed postoperative ileus.  DC suspected after GI surgery. -We will treat with NGT, bowel rest, IV hydration -We will continue with pain management -Encourage the patient to ambulate  [name], MD

## 2021-10-24 ENCOUNTER — Inpatient Hospital Stay: Payer: Medicare Other

## 2021-10-24 LAB — PHOSPHORUS: Phosphorus: 2.6 mg/dL (ref 2.5–4.6)

## 2021-10-24 LAB — BASIC METABOLIC PANEL
Anion gap: 14 (ref 5–15)
BUN: 7 mg/dL — ABNORMAL LOW (ref 8–23)
CO2: 18 mmol/L — ABNORMAL LOW (ref 22–32)
Calcium: 8.3 mg/dL — ABNORMAL LOW (ref 8.9–10.3)
Chloride: 98 mmol/L (ref 98–111)
Creatinine, Ser: 0.62 mg/dL (ref 0.44–1.00)
GFR, Estimated: >60 mL/min (ref >60)
Glucose, Bld: 68 mg/dL — ABNORMAL LOW (ref 70–99)
Potassium: 3.3 mmol/L — ABNORMAL LOW (ref 3.5–5.1)
Sodium: 130 mmol/L — ABNORMAL LOW (ref 135–145)

## 2021-10-24 LAB — MAGNESIUM: Magnesium: 1.7 mg/dL (ref 1.7–2.4)

## 2021-10-24 LAB — GLUCOSE, CAPILLARY: Glucose-Capillary: 93 mg/dL (ref 70–99)

## 2021-10-24 MED ORDER — SODIUM CHLORIDE 0.9% FLUSH: 10.0000 mL | INTRAVENOUS | Status: DC | PRN

## 2021-10-24 MED ORDER — SODIUM CHLORIDE 0.9% FLUSH
10.0000 mL | Freq: Two times a day (BID) | INTRAVENOUS | Status: DC
  Administered 2021-10-24 – 2021-10-29 (×6): 10 mL

## 2021-10-24 MED ORDER — MAGNESIUM SULFATE 2 GM/50ML IV SOLN
2.0000 g | Freq: Once | INTRAVENOUS | Status: AC
  Administered 2021-10-24: 2 g via INTRAVENOUS
  Filled 2021-10-24: qty 50

## 2021-10-24 MED ORDER — POTASSIUM PHOSPHATES 15 MMOLE/5ML IV SOLN
30.0000 mmol | Freq: Once | INTRAVENOUS | Status: AC
  Administered 2021-10-24: 30 mmol via INTRAVENOUS
  Filled 2021-10-24: qty 10

## 2021-10-24 MED ORDER — GABAPENTIN 600 MG PO TABS
300.0000 mg | ORAL_TABLET | Freq: Two times a day (BID) | ORAL | Status: DC
  Administered 2021-10-24 – 2021-10-30 (×12): 300 mg
  Filled 2021-10-24 (×12): qty 1

## 2021-10-24 MED ORDER — CELECOXIB 200 MG PO CAPS
200.0000 mg | ORAL_CAPSULE | Freq: Two times a day (BID) | ORAL | Status: DC
  Administered 2021-10-24 – 2021-10-30 (×12): 200 mg
  Filled 2021-10-24 (×12): qty 1

## 2021-10-24 NOTE — Care Management Important Message (Signed)
Important Message  Patient Details  Name: Mary Washington MRN: [id number] Date of Birth: [birth year]   Medicare Important Message Given:  Yes     Dannette Barbara 10/24/2021, 11:35 AM

## 2021-10-24 NOTE — Progress Notes (Signed)
Patient ID: Mary Washington, female   DOB: [birth year], 76 y.o.   MRN: [id number]     [place] Hospital Day(s): 3.   Interval History: Patient seen and examined, no acute events or new complaints overnight. Patient reports feeling better than yesterday.  He feels that she is passing small amount of gas, but still feels bloated.  Denies severe limiting pain.  Vital signs in last 24 hours: [min-max] current  Temp:  [97.5 F (36.4 C)-97.8 F (36.6 C)] 97.8 F (36.6 C) (05/25 0738) Pulse Rate:  [72-107] 103 (05/25 0738) Resp:  [16-18] 16 (05/25 0557) BP: (131-137)/(54-68) 137/65 (05/25 0738) SpO2:  [93 %-96 %] 95 % (05/25 0738)     Height: '4\' 11"'$  (149.9 cm) Weight: 52 kg BMI (Calculated): 23.14   Physical Exam:  Constitutional: alert, cooperative and no distress  Respiratory: breathing non-labored at rest  Cardiovascular: regular rate and sinus rhythm  Gastrointestinal: soft, non-tender, and non-distended  Labs:     Latest Ref Rng & Units 10/22/2021    5:35 AM 10/08/2021    1:27 PM 08/12/2021    8:11 AM  CBC  WBC 4.0 - 10.5 K/uL 7.4   8.3   6.2    Hemoglobin 12.0 - 15.0 g/dL 11.8   13.6   14.4    Hematocrit 36.0 - 46.0 % 37.0   41.5   45.7    Platelets 150 - 400 K/uL 271   309   308        Latest Ref Rng & Units 10/24/2021    7:12 AM 10/22/2021    5:35 AM 10/17/2021    1:40 PM  CMP  Glucose 70 - 99 mg/dL 68   99   103    BUN 8 - 23 mg/dL '7   11   16    '$ Creatinine 0.44 - 1.00 mg/dL 0.62   0.71   0.67    Sodium 135 - 145 mmol/L 130   131   135    Potassium 3.5 - 5.1 mmol/L 3.3   3.9   4.4    Chloride 98 - 111 mmol/L 98   99   99    CO2 22 - 32 mmol/L '18   25   27    '$ Calcium 8.9 - 10.3 mg/dL 8.3   8.5   9.4      Imaging studies: Abdominal x-ray shows nasogastric tube in place.  Diffuse small bowel and large bowel distention.   Assessment/Plan:  76 y.o. female with colostomy status 3 Days Post-Op s/p colostomy reversal.  Patient recovering slowly. -Still with  postoperative ileus.  This is improving as she is passing small amount of gas.  Still distended.  Will order x-ray for radiological evaluation. -We will continue NGT until the abdomen is less distended -Continue IV hydration -We will replace electrolytes -Encouraged to ambulate  [name], MD

## 2021-10-25 NOTE — Progress Notes (Signed)
Mobility Specialist - Progress Note   10/25/21 1259  Mobility  Activity Ambulated independently in hallway  Level of Assistance Modified independent, requires aide device or extra time  Assistive Device Front wheel walker  Distance Ambulated (ft) 360 ft  Activity Response Tolerated well  $Mobility charge 1 Mobility     Pt ambulated in hallway modI. No complaints. Pt returned to bed with needs in reach.    Kathee Delton Mobility Specialist 10/25/21, 1:00 PM

## 2021-10-25 NOTE — Progress Notes (Signed)
Patient ID: Lilianne Delair, female   DOB: [birth year], 76 y.o.   MRN: [id number]     [facility] Day(s): 4.   Interval History: Patient seen and examined, no acute events or new complaints overnight. Patient reports feeling better this morning.  She endorses that she ambulated this morning and passed a lot of gas.  She feels less bloated.  Vital signs in last 24 hours: [min-max] current  Temp:  [98 F (36.7 C)-98.7 F (37.1 C)] 98 F (36.7 C) (05/26 0742) Pulse Rate:  [75-93] 80 (05/26 0742) Resp:  [16-18] 18 (05/26 0742) BP: (138-151)/(63-66) 144/63 (05/26 0742) SpO2:  [94 %-99 %] 98 % (05/26 0742)     Height: '4\' 11"'$  (149.9 cm) Weight: 52 kg BMI (Calculated): 23.14   Physical Exam:  Constitutional: alert, cooperative and no distress  Respiratory: breathing non-labored at rest  Cardiovascular: regular rate and sinus rhythm  Gastrointestinal: soft, non-tender, and mild-distended  Labs:     Latest Ref Rng & Units 10/22/2021    5:35 AM 10/08/2021    1:27 PM 08/12/2021    8:11 AM  CBC  WBC 4.0 - 10.5 K/uL 7.4   8.3   6.2    Hemoglobin 12.0 - 15.0 g/dL 11.8   13.6   14.4    Hematocrit 36.0 - 46.0 % 37.0   41.5   45.7    Platelets 150 - 400 K/uL 271   309   308        Latest Ref Rng & Units 10/24/2021    7:12 AM 10/22/2021    5:35 AM 10/17/2021    1:40 PM  CMP  Glucose 70 - 99 mg/dL 68   99   103    BUN 8 - 23 mg/dL '7   11   16    '$ Creatinine 0.44 - 1.00 mg/dL 0.62   0.71   0.67    Sodium 135 - 145 mmol/L 130   131   135    Potassium 3.5 - 5.1 mmol/L 3.3   3.9   4.4    Chloride 98 - 111 mmol/L 98   99   99    CO2 22 - 32 mmol/L '18   25   27    '$ Calcium 8.9 - 10.3 mg/dL 8.3   8.5   9.4      Imaging studies: No new pertinent imaging studies   Assessment/Plan:  76 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   Patient recovering slowly. -Slowly improving postoperative ileus.  Passing gas.  Will clamp NGT and give clear liquid trial.   If she get nauseous or distended again we will hook her back to the suction -Continue IV hydration -Continue DVT prophylaxis -Encouraged to ambulate  [name], MD

## 2021-10-25 NOTE — Plan of Care (Signed)
  Problem: Activity: Goal: Risk for activity intolerance will decrease Outcome: Progressing   Problem: Elimination: Goal: Will not experience complications related to bowel motility Outcome: Progressing   Problem: Pain Managment: Goal: General experience of comfort will improve Outcome: Progressing   Problem: Safety: Goal: Ability to remain free from injury will improve Outcome: Progressing   

## 2021-10-26 LAB — CBC
HCT: 38.5 % (ref 36.0–46.0)
Hemoglobin: 12.8 g/dL (ref 12.0–15.0)
MCH: 26.4 pg (ref 26.0–34.0)
MCHC: 33.2 g/dL (ref 30.0–36.0)
MCV: 79.5 fL — ABNORMAL LOW (ref 80.0–100.0)
Platelets: 328 10*3/uL (ref 150–400)
RBC: 4.84 MIL/uL (ref 3.87–5.11)
RDW: 14.9 % (ref 11.5–15.5)
WBC: 7.4 10*3/uL (ref 4.0–10.5)
nRBC: 0 % (ref 0.0–0.2)

## 2021-10-26 LAB — BASIC METABOLIC PANEL
Anion gap: 8 (ref 5–15)
BUN: <5 mg/dL — ABNORMAL LOW (ref 8–23)
CO2: 24 mmol/L (ref 22–32)
Calcium: 8.5 mg/dL — ABNORMAL LOW (ref 8.9–10.3)
Chloride: 100 mmol/L (ref 98–111)
Creatinine, Ser: 0.42 mg/dL — ABNORMAL LOW (ref 0.44–1.00)
GFR, Estimated: >60 mL/min (ref >60)
Glucose, Bld: 106 mg/dL — ABNORMAL HIGH (ref 70–99)
Potassium: 3.4 mmol/L — ABNORMAL LOW (ref 3.5–5.1)
Sodium: 132 mmol/L — ABNORMAL LOW (ref 135–145)

## 2021-10-26 LAB — PHOSPHORUS: Phosphorus: 2.4 mg/dL — ABNORMAL LOW (ref 2.5–4.6)

## 2021-10-26 LAB — MAGNESIUM: Magnesium: 1.9 mg/dL (ref 1.7–2.4)

## 2021-10-26 MED ORDER — POTASSIUM PHOSPHATES 15 MMOLE/5ML IV SOLN
30.0000 mmol | Freq: Once | INTRAVENOUS | Status: AC
  Administered 2021-10-26: 30 mmol via INTRAVENOUS
  Filled 2021-10-26: qty 10

## 2021-10-26 NOTE — Progress Notes (Signed)
Patient ID: Mary Washington, female   DOB: [birth year], 76 y.o.   MRN: [id number]     [facility] Day(s): 5.   Interval History: Patient seen and examined, no acute events or new complaints overnight. Patient reports continued feeling intermittently bloated.  She is still passing small amount of gas but feels that the abdomen is still distended.  Denies significant abdominal pain.  Denies any nausea or vomiting.  Vital signs in last 24 hours: [min-max] current  Temp:  [98.2 F (36.8 C)-98.7 F (37.1 C)] 98.3 F (36.8 C) (05/27 0745) Pulse Rate:  [75-88] 88 (05/27 0745) Resp:  [18] 18 (05/27 0745) BP: (149-158)/(67-74) 149/74 (05/27 0745) SpO2:  [94 %-98 %] 96 % (05/27 0745)     Height: '4\' 11"'$  (149.9 cm) Weight: 52 kg BMI (Calculated): 23.14   Physical Exam:  Constitutional: alert, cooperative and no distress  Respiratory: breathing non-labored at rest  Cardiovascular: regular rate and sinus rhythm  Gastrointestinal: soft, non-tender, and distended  Labs:     Latest Ref Rng & Units 10/22/2021    5:35 AM 10/08/2021    1:27 PM 08/12/2021    8:11 AM  CBC  WBC 4.0 - 10.5 K/uL 7.4   8.3   6.2    Hemoglobin 12.0 - 15.0 g/dL 11.8   13.6   14.4    Hematocrit 36.0 - 46.0 % 37.0   41.5   45.7    Platelets 150 - 400 K/uL 271   309   308        Latest Ref Rng & Units 10/24/2021    7:12 AM 10/22/2021    5:35 AM 10/17/2021    1:40 PM  CMP  Glucose 70 - 99 mg/dL 68   99   103    BUN 8 - 23 mg/dL '7   11   16    '$ Creatinine 0.44 - 1.00 mg/dL 0.62   0.71   0.67    Sodium 135 - 145 mmol/L 130   131   135    Potassium 3.5 - 5.1 mmol/L 3.3   3.9   4.4    Chloride 98 - 111 mmol/L 98   99   99    CO2 22 - 32 mmol/L '18   25   27    '$ Calcium 8.9 - 10.3 mg/dL 8.3   8.5   9.4      Imaging studies: No new pertinent imaging studies   Assessment/Plan:  76 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   Patient recovering slowly. -Continue with intermittent  ileus.  Even though she is passing gas she still distended and bloated.  We will keep NGT clamped.  We will keep liquid diet for comfort.  Will not take NGT yet since she has high chance of developing nausea or vomiting.  If she does develop any nausea or vomiting NGT can be put back to suction. -We will check labs again replace electrolytes as needed. -Continue IV hydration -Continue DVT prophylaxis -Encouraged to ambulate  [name], MD

## 2021-10-27 MED ORDER — POLYETHYLENE GLYCOL 3350 17 G PO PACK
17.0000 g | PACK | Freq: Every day | ORAL | Status: DC
  Administered 2021-10-27 – 2021-10-29 (×3): 17 g via ORAL
  Filled 2021-10-27 (×4): qty 1

## 2021-10-27 NOTE — Progress Notes (Signed)
Patient ID: Mary Washington, female   DOB: 1946/01/17, 76 y.o.   MRN: [id number]     [facility] Day(s): 6.   Interval History: Patient seen and examined, no acute events or new complaints overnight. Patient reports feeling a little bit better. She endorses continue passing gas. Less bloated. No nausea or vomiting. Tolerated clear liquids.  Vital signs in last 24 hours: [min-max] current  Temp:  [97.6 F (36.4 C)-98.3 F (36.8 C)] 98.3 F (36.8 C) (05/28 0830) Pulse Rate:  [87-93] 89 (05/28 0830) Resp:  [16-18] 18 (05/28 0830) BP: (131-143)/(53-74) 136/71 (05/28 0830) SpO2:  [95 %-97 %] 97 % (05/28 0830)     Height: '4\' 11"'$  (149.9 cm) Weight: 52 kg BMI (Calculated): 23.14   Physical Exam:  Constitutional: alert, cooperative and no distress  Respiratory: breathing non-labored at rest  Cardiovascular: regular rate and sinus rhythm  Gastrointestinal: soft, non-tender, and mild-distended  Labs:     Latest Ref Rng & Units 10/26/2021   11:20 AM 10/22/2021    5:35 AM 10/08/2021    1:27 PM  CBC  WBC 4.0 - 10.5 K/uL 7.4   7.4   8.3    Hemoglobin 12.0 - 15.0 g/dL 12.8   11.8   13.6    Hematocrit 36.0 - 46.0 % 38.5   37.0   41.5    Platelets 150 - 400 K/uL 328   271   309        Latest Ref Rng & Units 10/26/2021   11:20 AM 10/24/2021    7:12 AM 10/22/2021    5:35 AM  CMP  Glucose 70 - 99 mg/dL 106   68   99    BUN 8 - 23 mg/dL '5   7   11    '$ Creatinine 0.44 - 1.00 mg/dL 0.42   0.62   0.71    Sodium 135 - 145 mmol/L 132   130   131    Potassium 3.5 - 5.1 mmol/L 3.4   3.3   3.9    Chloride 98 - 111 mmol/L 100   98   99    CO2 22 - 32 mmol/L '24   18   25    '$ Calcium 8.9 - 10.3 mg/dL 8.5   8.3   8.5      Imaging studies: No new pertinent imaging studies   Assessment/Plan:  76 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   Slowly resolving ileus - Will advance to full liquid diet.  -If tolerates will consider discontinue NGT -Encourage to  ambulate -Continue DVT prophylaxis.   [name], MD

## 2021-10-28 LAB — BASIC METABOLIC PANEL
Anion gap: 8 (ref 5–15)
BUN: <5 mg/dL — ABNORMAL LOW (ref 8–23)
CO2: 24 mmol/L (ref 22–32)
Calcium: 8.6 mg/dL — ABNORMAL LOW (ref 8.9–10.3)
Chloride: 98 mmol/L (ref 98–111)
Creatinine, Ser: 0.39 mg/dL — ABNORMAL LOW (ref 0.44–1.00)
GFR, Estimated: >60 mL/min (ref >60)
Glucose, Bld: 124 mg/dL — ABNORMAL HIGH (ref 70–99)
Potassium: 3.5 mmol/L (ref 3.5–5.1)
Sodium: 130 mmol/L — ABNORMAL LOW (ref 135–145)

## 2021-10-28 LAB — MAGNESIUM: Magnesium: 1.7 mg/dL (ref 1.7–2.4)

## 2021-10-28 LAB — PHOSPHORUS: Phosphorus: 3 mg/dL (ref 2.5–4.6)

## 2021-10-28 MED ORDER — POTASSIUM & SODIUM PHOSPHATES 280-160-250 MG PO PACK
1.0000 | PACK | Freq: Three times a day (TID) | ORAL | Status: DC
  Administered 2021-10-28 – 2021-10-30 (×6): 1 via ORAL
  Filled 2021-10-28 (×9): qty 1

## 2021-10-28 NOTE — Care Management Important Message (Signed)
Important Message  Patient Details  Name: Mary Washington MRN: [id number] Date of Birth: 10-03-1945   Medicare Important Message Given:  Yes     Dannette Barbara 10/28/2021, 10:56 AM

## 2021-10-28 NOTE — Progress Notes (Signed)
Mobility Specialist - Progress Note   10/28/21 1100  Mobility  Activity Ambulated independently in hallway  Level of Assistance Modified independent, requires aide device or extra time  Assistive Device Front wheel walker  Distance Ambulated (ft) 480 ft  Activity Response Tolerated well  $Mobility charge 1 Mobility     Pt finishing up in restroom upon arrival, utilizing RA. Pt ambulated in hallway modI with no complaints. Returned to bed with needs in reach.    Kathee Delton Mobility Specialist 10/28/21, 11:55 AM

## 2021-10-28 NOTE — Plan of Care (Signed)

## 2021-10-28 NOTE — Progress Notes (Signed)
Patient ID: Mary Washington, female   DOB: 15-Jun-1945, 76 y.o.   MRN: [id number]     [facility] Day(s): 7.   Interval History: Patient seen and examined, no acute events or new complaints overnight. Patient reports continue passing gas. Denies nausea or vomiting with full liquids. No abdominal pain  Vital signs in last 24 hours: [min-max] current  Temp:  [98 F (36.7 C)-98.5 F (36.9 C)] 98.2 F (36.8 C) (05/29 0846) Pulse Rate:  [94-110] 110 (05/29 0846) Resp:  [16-20] 18 (05/29 0846) BP: (120-139)/(65-68) 120/65 (05/29 0846) SpO2:  [93 %-98 %] 94 % (05/29 0846)     Height: '4\' 11"'$  (149.9 cm) Weight: 52 kg BMI (Calculated): 23.14   Physical Exam:  Constitutional: alert, cooperative and no distress  Respiratory: breathing non-labored at rest  Cardiovascular: regular rate and sinus rhythm  Gastrointestinal: soft, non-tender, and mild-distended  Labs:     Latest Ref Rng & Units 10/26/2021   11:20 AM 10/22/2021    5:35 AM 10/08/2021    1:27 PM  CBC  WBC 4.0 - 10.5 K/uL 7.4   7.4   8.3    Hemoglobin 12.0 - 15.0 g/dL 12.8   11.8   13.6    Hematocrit 36.0 - 46.0 % 38.5   37.0   41.5    Platelets 150 - 400 K/uL 328   271   309        Latest Ref Rng & Units 10/26/2021   11:20 AM 10/24/2021    7:12 AM 10/22/2021    5:35 AM  CMP  Glucose 70 - 99 mg/dL 106   68   99    BUN 8 - 23 mg/dL '5   7   11    '$ Creatinine 0.44 - 1.00 mg/dL 0.42   0.62   0.71    Sodium 135 - 145 mmol/L 132   130   131    Potassium 3.5 - 5.1 mmol/L 3.4   3.3   3.9    Chloride 98 - 111 mmol/L 100   98   99    CO2 22 - 32 mmol/L '24   18   25    '$ Calcium 8.9 - 10.3 mg/dL 8.5   8.3   8.5      Imaging studies: No new pertinent imaging studies   Assessment/Plan:  76 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  -Resolving post op ileus. Abdomen is mild distended but patient is passing gas and has not had any nausea in the last 3 days despite having the NGT clamped.  - I  discontinued the NGT - Will continue with full liquid diet - No fever, normal WBC count - Encourage to ambulate - Continue DVT prophylaxis  [name], MD

## 2021-10-29 LAB — CBC
HCT: 37.5 % (ref 36.0–46.0)
Hemoglobin: 12.5 g/dL (ref 12.0–15.0)
MCH: 26.7 pg (ref 26.0–34.0)
MCHC: 33.3 g/dL (ref 30.0–36.0)
MCV: 80 fL (ref 80.0–100.0)
Platelets: 362 10*3/uL (ref 150–400)
RBC: 4.69 MIL/uL (ref 3.87–5.11)
RDW: 15 % (ref 11.5–15.5)
WBC: 5.6 10*3/uL (ref 4.0–10.5)
nRBC: 0 % (ref 0.0–0.2)

## 2021-10-29 LAB — CREATININE, SERUM
Creatinine, Ser: 0.45 mg/dL (ref 0.44–1.00)
GFR, Estimated: >60 mL/min (ref >60)

## 2021-10-29 NOTE — Progress Notes (Signed)
Patient ID: Madisun Hargrove, female   DOB: [birth year], 76 y.o.   MRN: [id number]     [facility] Hospital Day(s): 8.   Interval History: Patient seen and examined, no acute events or new complaints overnight. Patient reports continue passing gas.  Pain controlled.  Denies any fever or chills.  Denies any nausea or vomiting.  She tolerated full liquids.  Vital signs in last 24 hours: [min-max] current  Temp:  [97.7 F (36.5 C)-98.5 F (36.9 C)] 98 F (36.7 C) (05/30 1458) Pulse Rate:  [89-93] 90 (05/30 1458) Resp:  [16-20] 16 (05/30 1458) BP: (128-150)/(58-70) 128/58 (05/30 1458) SpO2:  [96 %-100 %] 96 % (05/30 1458)     Height: '4\' 11"'$  (149.9 cm) Weight: 52 kg BMI (Calculated): 23.14   Physical Exam:  Constitutional: alert, cooperative and no distress  Respiratory: breathing non-labored at rest  Cardiovascular: regular rate and sinus rhythm  Gastrointestinal: soft, non-tender, and mild-distended  Labs:     Latest Ref Rng & Units 10/29/2021    4:25 AM 10/26/2021   11:20 AM 10/22/2021    5:35 AM  CBC  WBC 4.0 - 10.5 K/uL 5.6   7.4   7.4    Hemoglobin 12.0 - 15.0 g/dL 12.5   12.8   11.8    Hematocrit 36.0 - 46.0 % 37.5   38.5   37.0    Platelets 150 - 400 K/uL 362   328   271        Latest Ref Rng & Units 10/29/2021    4:25 AM 10/28/2021   10:57 AM 10/26/2021   11:20 AM  CMP  Glucose 70 - 99 mg/dL  124   106    BUN 8 - 23 mg/dL  <5   <5    Creatinine 0.44 - 1.00 mg/dL 0.45   0.39   0.42    Sodium 135 - 145 mmol/L  130   132    Potassium 3.5 - 5.1 mmol/L  3.5   3.4    Chloride 98 - 111 mmol/L  98   100    CO2 22 - 32 mmol/L  24   24    Calcium 8.9 - 10.3 mg/dL  8.6   8.5      Imaging studies: No new pertinent imaging studies   Assessment/Plan:  76 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   -Resolving post op ileus. Abdomen is mild distended but patient is passing gas and has tolerated full liquids continue having bowel movement. -  Will advance diet to soft diet -Discontinue IV fluids - No fever, normal WBC count - Encourage to ambulate - Continue DVT prophylaxis  [name], MD

## 2021-10-30 MED ORDER — HEPARIN SOD (PORK) LOCK FLUSH 100 UNIT/ML IV SOLN
500.0000 [IU] | INTRAVENOUS | Status: AC | PRN
Start: 2021-10-30 — End: 2021-10-30
  Administered 2021-10-30: 500 [IU]

## 2021-10-30 NOTE — Discharge Summary (Signed)
Patient ID: Zavannah Deblois MRN: [id number] DOB/AGE: [birth year] 76 y.o.  Admit date: 10/21/2021 Discharge date: 10/30/2021   Discharge Diagnoses:  Principal Problem:   Colostomy status [facility])   Procedures: Robotic assisted laparoscopic colostomy reversal  Hospital Course: Patient admitted for colostomy reversal.  She tolerated the procedure well but developed postoperative ileus.  This has resolved.  Patient tolerating soft diet.  Patient passing gas.  Patient ambulating.  Pain controlled.  Wounds are dry and clean.  Physical Exam Vitals reviewed.  Constitutional:      Appearance: Normal appearance.  HENT:     Head: Normocephalic.  Cardiovascular:     Rate and Rhythm: Normal rate and regular rhythm.  Pulmonary:     Effort: Pulmonary effort is normal.     Breath sounds: Normal breath sounds.  Abdominal:     General: Abdomen is flat.  Musculoskeletal:     Cervical back: Normal range of motion.  Skin:    General: Skin is warm.     Capillary Refill: Capillary refill takes less than 2 seconds.  Neurological:     Mental Status: She is alert and oriented to person, place, and time.     Consults: None  Disposition: Discharge disposition: 01-Home or Self Care       Discharge Instructions     Diet - low sodium heart healthy   Complete by: As directed    Increase activity slowly   Complete by: As directed       Allergies as of 10/30/2021   No Known Allergies      Medication List     TAKE these medications    acetaminophen 500 MG tablet Commonly known as: TYLENOL Take 500 mg by mouth every 6 (six) hours as needed for moderate pain or mild pain.   Calcium Carb-Cholecalciferol 600-20 MG-MCG Tabs Take 1 tablet by mouth daily. citracal   ciprofloxacin 500 MG tablet Commonly known as: CIPRO Take 500 mg by mouth 2 (two) times daily.   diclofenac Sodium 1 % Gel Commonly known as: VOLTAREN Apply 2 g topically 2 (two) times daily as needed (left leg pain).    ibandronate 150 MG tablet Commonly known as: BONIVA Take 150 mg by mouth every 30 (thirty) days. Take in the morning with a full glass of water, on an empty stomach, and do not take anything else by mouth or lie down for the next 30 min.   lidocaine-prilocaine cream Commonly known as: EMLA 1 application. as needed (on her port).   MAG-OXIDE PO Take 250 mg by mouth daily at 12 noon.   meloxicam 15 MG tablet Commonly known as: MOBIC Take 15 mg by mouth daily.   metoprolol tartrate 25 MG tablet Commonly known as: LOPRESSOR Take 1 tablet (25 mg total) by mouth 2 (two) times daily.   metroNIDAZOLE 500 MG tablet Commonly known as: FLAGYL Take 500 mg by mouth once.   omeprazole 20 MG capsule Commonly known as: PRILOSEC Take 20 mg by mouth every morning.   polyethylene glycol 17 g packet Commonly known as: MIRALAX / GLYCOLAX Take 17 g by mouth daily.   Vitamin D 50 MCG (2000 UT) Caps Take 2,000 Units by mouth daily.   WOMENS MULTIVITAMIN PO Take 1 Tera Vector Genomes by mouth daily.        Follow-up Information     Herbert Pun, MD Follow up on 11/12/2021.   Specialty: General Surgery Contact information: [address] [place] [address] [phone]

## 2021-10-30 NOTE — Discharge Instructions (Signed)
  Diet: Resume home heart healthy soft diet.   Activity: No heavy lifting >20 pounds (children, pets, laundry, garbage) or strenuous activity until follow-up, but light activity and walking are encouraged. Do not drive or drink alcohol if taking narcotic pain medications.  Wound care: May shower with soapy water and pat dry (do not rub incisions), but no baths or submerging incision underwater until follow-up. (no swimming)   Medications: Resume all home medications. For mild to moderate pain: acetaminophen (Tylenol) or ibuprofen (if no kidney disease). Combining Tylenol with alcohol can substantially increase your risk of causing liver disease. Narcotic pain medications, if prescribed, can be used for severe pain, though may cause nausea, constipation, and drowsiness. Do not combine Tylenol and Norco within a 6 hour period as Norco contains Tylenol. If you do not need the narcotic pain medication, you do not need to fill the prescription.  Call office [phone]) at any time if any questions, worsening pain, fevers/chills, bleeding, drainage from incision site, or other concerns.

## 2021-10-30 NOTE — Plan of Care (Signed)

## 2021-11-19 ENCOUNTER — Inpatient Hospital Stay: Payer: Medicare Other | Attending: Internal Medicine

## 2021-11-19 DIAGNOSIS — C8338 Diffuse large B-cell lymphoma, lymph nodes of multiple sites: Secondary | ICD-10-CM | POA: Insufficient documentation

## 2021-11-19 DIAGNOSIS — Z452 Encounter for adjustment and management of vascular access device: Secondary | ICD-10-CM | POA: Insufficient documentation

## 2021-11-19 DIAGNOSIS — Z95828 Presence of other vascular implants and grafts: Secondary | ICD-10-CM

## 2021-11-19 MED ORDER — SODIUM CHLORIDE 0.9% FLUSH
10.0000 mL | INTRAVENOUS | Status: DC | PRN
  Administered 2021-11-19: 10 mL via INTRAVENOUS
  Filled 2021-11-19: qty 10

## 2021-11-19 MED ORDER — HEPARIN SOD (PORK) LOCK FLUSH 100 UNIT/ML IV SOLN
500.0000 [IU] | Freq: Once | INTRAVENOUS | Status: AC
  Administered 2021-11-19: 500 [IU] via INTRAVENOUS
  Filled 2021-11-19: qty 5

## 2021-12-18 ENCOUNTER — Other Ambulatory Visit: Payer: Self-pay | Admitting: Internal Medicine

## 2021-12-18 DIAGNOSIS — Z1231 Encounter for screening mammogram for malignant neoplasm of breast: Secondary | ICD-10-CM

## 2022-01-08 ENCOUNTER — Inpatient Hospital Stay: Payer: Medicare Other | Admitting: Internal Medicine

## 2022-01-08 ENCOUNTER — Inpatient Hospital Stay: Payer: Medicare Other | Attending: Internal Medicine

## 2022-01-08 ENCOUNTER — Encounter: Payer: Self-pay | Admitting: Internal Medicine

## 2022-01-08 VITALS — BP 130/67 | HR 84 | Temp 96.6°F | Ht 59.0 in | Wt 113.4 lb

## 2022-01-08 DIAGNOSIS — Z85820 Personal history of malignant melanoma of skin: Secondary | ICD-10-CM | POA: Diagnosis not present

## 2022-01-08 DIAGNOSIS — M25562 Pain in left knee: Secondary | ICD-10-CM | POA: Insufficient documentation

## 2022-01-08 DIAGNOSIS — Z933 Colostomy status: Secondary | ICD-10-CM | POA: Diagnosis not present

## 2022-01-08 DIAGNOSIS — K862 Cyst of pancreas: Secondary | ICD-10-CM | POA: Diagnosis not present

## 2022-01-08 DIAGNOSIS — G629 Polyneuropathy, unspecified: Secondary | ICD-10-CM | POA: Diagnosis not present

## 2022-01-08 DIAGNOSIS — C8338 Diffuse large B-cell lymphoma, lymph nodes of multiple sites: Secondary | ICD-10-CM | POA: Diagnosis present

## 2022-01-08 DIAGNOSIS — C8581 Other specified types of non-Hodgkin lymphoma, lymph nodes of head, face, and neck: Secondary | ICD-10-CM

## 2022-01-08 DIAGNOSIS — Z95828 Presence of other vascular implants and grafts: Secondary | ICD-10-CM

## 2022-01-08 DIAGNOSIS — M549 Dorsalgia, unspecified: Secondary | ICD-10-CM | POA: Diagnosis not present

## 2022-01-08 DIAGNOSIS — R1900 Intra-abdominal and pelvic swelling, mass and lump, unspecified site: Secondary | ICD-10-CM | POA: Diagnosis not present

## 2022-01-08 DIAGNOSIS — E871 Hypo-osmolality and hyponatremia: Secondary | ICD-10-CM | POA: Diagnosis not present

## 2022-01-08 LAB — CBC WITH DIFFERENTIAL/PLATELET
Abs Immature Granulocytes: 0.02 10*3/uL (ref 0.00–0.07)
Basophils Absolute: 0 10*3/uL (ref 0.0–0.1)
Basophils Relative: 0 %
Eosinophils Absolute: 0.1 10*3/uL (ref 0.0–0.5)
Eosinophils Relative: 2 %
HCT: 44.2 % (ref 36.0–46.0)
Hemoglobin: 14.5 g/dL (ref 12.0–15.0)
Immature Granulocytes: 0 %
Lymphocytes Relative: 25 %
Lymphs Abs: 2 10*3/uL (ref 0.7–4.0)
MCH: 27.3 pg (ref 26.0–34.0)
MCHC: 32.8 g/dL (ref 30.0–36.0)
MCV: 83.1 fL (ref 80.0–100.0)
Monocytes Absolute: 0.6 10*3/uL (ref 0.1–1.0)
Monocytes Relative: 7 %
Neutro Abs: 5.6 10*3/uL (ref 1.7–7.7)
Neutrophils Relative %: 66 %
Platelets: 283 10*3/uL (ref 150–400)
RBC: 5.32 MIL/uL — ABNORMAL HIGH (ref 3.87–5.11)
RDW: 14.6 % (ref 11.5–15.5)
WBC: 8.3 10*3/uL (ref 4.0–10.5)
nRBC: 0 % (ref 0.0–0.2)

## 2022-01-08 LAB — LACTATE DEHYDROGENASE: LDH: 107 U/L (ref 98–192)

## 2022-01-08 LAB — COMPREHENSIVE METABOLIC PANEL
ALT: 15 U/L (ref 0–44)
AST: 25 U/L (ref 15–41)
Albumin: 3.9 g/dL (ref 3.5–5.0)
Alkaline Phosphatase: 76 U/L (ref 38–126)
Anion gap: 9 (ref 5–15)
BUN: 18 mg/dL (ref 8–23)
CO2: 25 mmol/L (ref 22–32)
Calcium: 9.2 mg/dL (ref 8.9–10.3)
Chloride: 97 mmol/L — ABNORMAL LOW (ref 98–111)
Creatinine, Ser: 0.79 mg/dL (ref 0.44–1.00)
GFR, Estimated: >60 mL/min (ref >60)
Glucose, Bld: 183 mg/dL — ABNORMAL HIGH (ref 70–99)
Potassium: 3.9 mmol/L (ref 3.5–5.1)
Sodium: 131 mmol/L — ABNORMAL LOW (ref 135–145)
Total Bilirubin: 0.3 mg/dL (ref 0.3–1.2)
Total Protein: 6.2 g/dL — ABNORMAL LOW (ref 6.5–8.1)

## 2022-01-08 MED ORDER — HEPARIN SOD (PORK) LOCK FLUSH 100 UNIT/ML IV SOLN
500.0000 [IU] | Freq: Once | INTRAVENOUS | Status: AC
  Administered 2022-01-08: 500 [IU] via INTRAVENOUS
  Filled 2022-01-08: qty 5

## 2022-01-08 MED ORDER — SODIUM CHLORIDE 0.9% FLUSH
10.0000 mL | INTRAVENOUS | Status: DC | PRN
  Administered 2022-01-08: 10 mL via INTRAVENOUS
  Filled 2022-01-08: qty 10

## 2022-01-08 NOTE — Assessment & Plan Note (Addendum)
#  Diffuse large B-cell lymphoma-stage III.  PET June 2022-bulky right axillary/subpectoral; bulky retroperitoneal adenopathy; s/p 5 cycles of R-CHOP; discontinued cycle #6 [complicated sigmoid diverticulitis]-FEB 3rd, 2023- PET scan July 06, 2020-no residual/recurrent lymphoma.  #Continue clinical surveillance at this time.  We will plan imaging based on symptoms; STABLE.   #Compression fractures -PET scan February 2023 incidental  multiple thoracolumbar compression fractures progressive T7-T8- ? Osteoporotic; NO BMD since 2016 [prior in [place], [place]]- s/p-kyphoplasty of thoracic vertebra x2 [IR]-pain improved not resolved.  Monitor for now-  #Complicated sigmoid diverticulitis [NOV 2022] s/p left hemicolectomy [Dr.Cintron]-  Patient awaiting s/p reversal of colostomy in MAY 22nd. 2023- STABLE.    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 will order MRI for 3 months    # Hyponatremia sodium 131- chronic- 130s- stable-likely poor p.o. intake.; increased salt intake-  STABLE.   #Peripheral neuropathy grade 1-secondary vincristine.  Monitor for now-STABLE.   # chronic back pain-likely secondary to lymphoma impinging the neuroforaminal nerve- Improved; # Left knee pain/stiff [s/p Evaluation Ortho]- on volatren gel-STABLE. .   # Port/IV access- Stable; discussed re: pro and cons of keeping the port vs. Explantation; Poor IV access-keep for now.   [phone] [home]  # DISPOSITION: # port flush in 6 weeks # follow up in 3 months- MD; labs- cbc/cmp;LDH; port flush; MRI abdomen prior---Dr.B

## 2022-01-08 NOTE — Progress Notes (Signed)
[place] NOTE  Patient Care Team: [name], MD as PCP - General (Internal Medicine) [name], MD as Consulting Physician (Oncology)  CHIEF COMPLAINTS/PURPOSE OF CONSULTATION: Lymphoma Oncology History Overview Note   # Incidental- MAY 2022-CT scan chest abdomen pelvis consistent with bulky retroperitoneal adenopathy [6-8 cm]; subpectoral/axillary lymphadenopathy 2 to 2.5 cm; PET June 2022-bulky right axillary/subpectoral; bulky retroperitoneal adenopathy.  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  Repeat MRI 3-6 m   # coloscopy [2022; [facility]]; mammogram- nov 2021-WNL.   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 .  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  Walking with a rolling walker.   Mary Washington 76 y.o.   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  In the interim patient underwent reversal of colostomy.  Patient is currently home.   No worsening shortness of breath or cough.  Appetite is good.  She is walking with a walker.  Denies any new lumps or bumps.  Review of Systems  Constitutional:  Positive for malaise/fatigue. Negative for chills, diaphoresis and fever.  HENT:  Negative for nosebleeds and sore throat.   Eyes:  Negative for double vision.  Respiratory:  Negative for cough, hemoptysis, sputum production, shortness of breath and wheezing.   Cardiovascular:  Negative for chest pain, palpitations, orthopnea and leg swelling.  Gastrointestinal:  Negative for abdominal pain, blood in stool, diarrhea, heartburn, melena, nausea and vomiting.  Genitourinary:  Negative for dysuria, frequency and urgency.  Musculoskeletal:  Positive for joint pain.  Skin: Negative.  Negative for itching and rash.  Neurological:  Negative for dizziness, tingling, focal weakness, weakness and headaches.  Endo/Heme/Allergies:  Does not bruise/bleed easily.  Psychiatric/Behavioral:  Negative for depression. The patient is not nervous/anxious and does not have insomnia.      MEDICAL HISTORY:  Past Medical History:  Diagnosis Date  . Anemia   . Arthritis   . Colon polyps   . Colostomy in place [facility])   . Complication of anesthesia    bp dropped with novacaine at dentist office and pt had to be admitted to hospital for 24 hours  . Diverticulosis   . Family history of adverse reaction to anesthesia    brother-pt unsure of what reaction her brother had  . GERD (gastroesophageal  reflux disease)   . Hyponatremia   . 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 Melanoma ([place]) 1988   lt upper arm, some lymph nodes removed  . Mild intermittent asthma    well controlled  . Osteoporosis   . Pneumonia  2022  . Tachycardia     SURGICAL HISTORY: Past Surgical History:  Procedure Laterality Date  . BACK SURGERY    . CATARACT EXTRACTION    . COLONOSCOPY WITH PROPOFOL N/A 09/12/2021   Procedure: COLONOSCOPY WITH PROPOFOL;  Surgeon: Benjamine Sprague, DO;  Location: [facility] ENDOSCOPY;  Service: General;  Laterality: N/A;  Dr Lysle Pearl can be 12:30 PM  procedure start time  . IR IMAGING GUIDED PORT INSERTION  11/23/2020  . IR KYPHO THORACIC WITH BONE BIOPSY  08/12/2021  . IR RADIOLOGIST EVAL & MGMT  07/23/2021  . IR RADIOLOGIST EVAL & MGMT  09/11/2021  . LAPAROSCOPIC LYSIS OF ADHESIONS  10/21/2021   Procedure: LAPAROSCOPIC LYSIS OF ADHESIONS;  Surgeon: Herbert Pun, MD;  Location: [facility] ORS;  Service: General;;  . PARTIAL COLECTOMY N/A 04/24/2021   Procedure: PARTIAL COLECTOMY;  Surgeon: Herbert Pun, MD;  Location: [facility] ORS;  Service: General;  Laterality: N/A;  . TONSILLECTOMY    . VAGINAL HYSTERECTOMY  1990  . XI ROBOTIC ASSISTED COLOSTOMY TAKEDOWN N/A 10/21/2021   Procedure: XI ROBOTIC ASSISTED COLOSTOMY TAKEDOWN;  Surgeon: Herbert Pun, MD;  Location: [facility] ORS;  Service: General;  Laterality: N/A;    SOCIAL HISTORY: Social History   Socioeconomic History  . Marital status: Married    Spouse name: Mikki Santee  . Number of children: 2  . Years of education: Not on file  . Highest education level: Not on file  Occupational History  . Not on file  Tobacco Use  . Smoking status: Never  . Smokeless tobacco: Never  Vaping Use  . Vaping Use: Never used  Substance and Sexual Activity  . Alcohol use: Yes    Comment: rare  . Drug use: Never  . Sexual activity: Yes    Partners: Male  Other Topics Concern  . Not on file  Social History Narrative   ** Merged History Encounter **       Lives at home with husband.    Social Determinants of Health   Financial Resource Strain: Not on file  Food Insecurity: Not on file  Transportation Needs: Not on file  Physical Activity: Not  on file  Stress: Not on file  Social Connections: Not on file  Intimate Partner Violence: Not on file    FAMILY HISTORY: Family History  Problem Relation Age of Onset  . Breast cancer Neg Hx     ALLERGIES:  has No Known Allergies.  MEDICATIONS:  Current Outpatient Medications  Medication Sig Dispense Refill  . acetaminophen (TYLENOL) 500 MG tablet Take 500 mg by mouth every 6 (six) hours as needed for moderate pain or mild pain.    . Calcium Carb-Cholecalciferol 600-20 MG-MCG TABS Take 1 tablet by mouth daily. citracal    . Cholecalciferol (VITAMIN D) 50 MCG (2000 UT) CAPS Take 2,000 Units by mouth daily.    . diclofenac Sodium (VOLTAREN) 1 % GEL Apply 2 g topically 2 (two) times daily as needed (left leg pain).    . ibandronate (BONIVA) 150 MG tablet Take 150 mg by mouth every 30 (thirty) days. Take in the morning with a full glass of water, on an empty stomach, and do not take anything else by mouth  or lie down for the next 30 min.    . lidocaine-prilocaine (EMLA) cream 1 application. as needed (on her port).    . Magnesium Oxide (MAG-OXIDE PO) Take 250 mg by mouth daily at 12 noon.    . Multiple Vitamins-Minerals (WOMENS MULTIVITAMIN PO) Take 1 Tera Vector Genomes by mouth daily.    Marland Kitchen omeprazole (PRILOSEC) 20 MG capsule Take 20 mg by mouth every morning.    . polyethylene glycol (MIRALAX / GLYCOLAX) 17 g packet Take 17 g by mouth daily.    . ciprofloxacin (CIPRO) 500 MG tablet Take 500 mg by mouth 2 (two) times daily. (Patient not taking: Reported on 01/08/2022)    . meloxicam (MOBIC) 15 MG tablet Take 15 mg by mouth daily. (Patient not taking: Reported on 01/08/2022)    . metoprolol tartrate (LOPRESSOR) 25 MG tablet Take 1 tablet (25 mg total) by mouth 2 (two) times daily. 60 tablet 0  . metroNIDAZOLE (FLAGYL) 500 MG tablet Take 500 mg by mouth once. (Patient not taking: Reported on 01/08/2022)     No current facility-administered medications for this visit.   Facility-Administered  Medications Ordered in Other Visits  Medication Dose Route Frequency Provider Last Rate Last Admin  . sodium chloride flush (NS) 0.9 % injection 10 mL  10 mL Intravenous PRN [name], MD   10 mL at 01/07/21 0919  . sodium chloride flush (NS) 0.9 % injection 10 mL  10 mL Intravenous PRN [name], MD   10 mL at 01/08/22 1335      .  PHYSICAL EXAMINATION: ECOG PERFORMANCE STATUS: 1 - Symptomatic but completely ambulatory  Vitals:   01/08/22 1340  BP: 130/67  Pulse: 84  Temp: (!) 96.6 F (35.9 C)  SpO2: 100%    Filed Weights   01/08/22 1340  Weight: 113 lb 6.4 oz (51.4 kg)      Physical Exam HENT:     Head: Normocephalic and atraumatic.     Mouth/Throat:     Pharynx: No oropharyngeal exudate.  Eyes:     Pupils: Pupils are equal, round, and reactive to light.  Cardiovascular:     Rate and Rhythm: Normal rate and regular rhythm.  Pulmonary:     Effort: Pulmonary effort is normal. No respiratory distress.     Breath sounds: Normal breath sounds. No wheezing.  Abdominal:     General: Bowel sounds are normal. There is no distension.     Palpations: Abdomen is soft. There is no mass.     Tenderness: There is no abdominal tenderness. There is no guarding or rebound.  Musculoskeletal:        General: Normal range of motion.     Cervical back: Normal range of motion and neck supple.  Skin:    General: Skin is warm.  Neurological:     Mental Status: She is alert and oriented to person, place, and time.  Psychiatric:        Mood and Affect: Affect normal.     LABORATORY DATA:  I have reviewed the data as listed Lab Results  Component Value Date   WBC 8.3 01/08/2022   HGB 14.5 01/08/2022   HCT 44.2 01/08/2022   MCV 83.1 01/08/2022   PLT 283 01/08/2022   Recent Labs    07/08/21 1400 10/08/21 1327 10/17/21 1340 10/26/21 1120 10/28/21 1057 10/29/21 0425 01/08/22 1334  NA 127* 128*   < > 132* 130*  --  131*  K 4.4 4.1   < >  3.4* 3.5   --  3.9  CL 93* 95*   < > 100 98  --  97*  CO2 24 24   < > 24 24  --  25  GLUCOSE 131* 135*   < > 106* 124*  --  183*  BUN 17 22   < > <5* <5*  --  18  CREATININE 0.77 0.75   < > 0.42* 0.39* 0.45 0.79  CALCIUM 9.2 8.9   < > 8.5* 8.6*  --  9.2  GFRNONAA >60 >60   < > >60 >60 >60 >60  PROT 6.1* 6.3*  --   --   --   --  6.2*  ALBUMIN 3.8 3.9  --   --   --   --  3.9  AST 22 23  --   --   --   --  25  ALT 19 15  --   --   --   --  15  ALKPHOS 164* 106  --   --   --   --  76  BILITOT 0.2* 0.6  --   --   --   --  0.3   < > = values in this interval not displayed.    RADIOGRAPHIC STUDIES: I have personally reviewed the radiological images as listed and agreed with the findings in the report.   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  PET June 2022-bulky right axillary/subpectoral; bulky retroperitoneal adenopathy; s/p 5 cycles of R-CHOP; discontinued cycle #6 [complicated sigmoid diverticulitis]-FEB 3rd, 2023- PET scan July 06, 2020-no residual/recurrent lymphoma.  #Continue clinical surveillance at this time.  We will plan imaging based on symptoms; STABLE.   #Compression fractures -PET scan February 2023 incidental  multiple thoracolumbar compression fractures progressive T7-T8- ? Osteoporotic; NO BMD since 2016 [prior in [place], [place]]- s/p-kyphoplasty of thoracic vertebra x2 [IR]-pain improved not resolved.  Monitor for now-  #Complicated sigmoid diverticulitis [NOV 2022] s/p left hemicolectomy [Dr.Cintron]-  Patient awaiting s/p reversal of colostomy in MAY 22nd. 2023- STABLE.    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 will order MRI for 3 months    # Hyponatremia sodium 131- chronic- 130s- stable-likely poor p.o. intake.; increased salt intake-  STABLE.   #Peripheral neuropathy grade 1-secondary vincristine.  Monitor for now-STABLE.   # chronic  back pain-likely secondary to lymphoma impinging the neuroforaminal nerve- Improved; # Left knee pain/stiff [s/p Evaluation Ortho]- on volatren gel-STABLE. .   # Port/IV access- Stable; discussed re: pro and cons of keeping the port vs. Explantation; Poor IV access-keep for now.   [phone] [home]  # DISPOSITION: # port flush in 6 weeks # follow up in 3 months- MD; labs- cbc/cmp;LDH; port flush; MRI abdomen prior---Dr.B         All questions were answered. The patient knows to call the clinic with any problems, questions or concerns.    [name], MD 01/08/2022 2:42 PM

## 2022-01-10 ENCOUNTER — Ambulatory Visit
Admission: RE | Admit: 2022-01-10 | Discharge: 2022-01-10 | Disposition: A | Discharge status: Home / Self Care | Payer: Medicare Other | Source: Ambulatory Visit | Attending: Internal Medicine | Admitting: Internal Medicine

## 2022-01-10 DIAGNOSIS — Z1231 Encounter for screening mammogram for malignant neoplasm of breast: Secondary | ICD-10-CM | POA: Insufficient documentation

## 2022-01-24 ENCOUNTER — Other Ambulatory Visit: Payer: Self-pay | Admitting: Orthopedic Surgery

## 2022-01-24 DIAGNOSIS — M1712 Unilateral primary osteoarthritis, left knee: Secondary | ICD-10-CM

## 2022-01-24 DIAGNOSIS — G8929 Other chronic pain: Secondary | ICD-10-CM

## 2022-02-04 ENCOUNTER — Ambulatory Visit
Admission: RE | Admit: 2022-02-04 | Discharge: 2022-02-04 | Disposition: A | Discharge status: Home / Self Care | Payer: Medicare Other | Source: Ambulatory Visit | Attending: Orthopedic Surgery | Admitting: Orthopedic Surgery

## 2022-02-04 DIAGNOSIS — G8929 Other chronic pain: Secondary | ICD-10-CM | POA: Diagnosis present

## 2022-02-04 DIAGNOSIS — M25562 Pain in left knee: Secondary | ICD-10-CM | POA: Insufficient documentation

## 2022-02-04 DIAGNOSIS — M1712 Unilateral primary osteoarthritis, left knee: Secondary | ICD-10-CM

## 2022-02-19 ENCOUNTER — Inpatient Hospital Stay: Payer: Medicare Other | Attending: Internal Medicine

## 2022-02-19 DIAGNOSIS — C8338 Diffuse large B-cell lymphoma, lymph nodes of multiple sites: Secondary | ICD-10-CM | POA: Diagnosis present

## 2022-02-19 DIAGNOSIS — Z452 Encounter for adjustment and management of vascular access device: Secondary | ICD-10-CM | POA: Diagnosis present

## 2022-02-19 DIAGNOSIS — Z95828 Presence of other vascular implants and grafts: Secondary | ICD-10-CM

## 2022-02-19 MED ORDER — SODIUM CHLORIDE 0.9% FLUSH
10.0000 mL | Freq: Once | INTRAVENOUS | Status: AC
  Administered 2022-02-19: 10 mL via INTRAVENOUS
  Filled 2022-02-19: qty 10

## 2022-02-19 MED ORDER — HEPARIN SOD (PORK) LOCK FLUSH 100 UNIT/ML IV SOLN
500.0000 [IU] | Freq: Once | INTRAVENOUS | Status: AC
  Administered 2022-02-19: 500 [IU] via INTRAVENOUS
  Filled 2022-02-19: qty 5

## 2022-02-27 ENCOUNTER — Encounter: Payer: Self-pay | Admitting: Internal Medicine

## 2022-02-27 ENCOUNTER — Other Ambulatory Visit: Payer: Self-pay | Admitting: *Deleted

## 2022-02-27 DIAGNOSIS — C8581 Other specified types of non-Hodgkin lymphoma, lymph nodes of head, face, and neck: Secondary | ICD-10-CM

## 2022-04-08 ENCOUNTER — Ambulatory Visit
Admission: RE | Admit: 2022-04-08 | Discharge: 2022-04-08 | Disposition: A | Discharge status: Home / Self Care | Payer: Medicare Other | Source: Ambulatory Visit | Attending: Internal Medicine | Admitting: Internal Medicine

## 2022-04-08 DIAGNOSIS — K862 Cyst of pancreas: Secondary | ICD-10-CM | POA: Diagnosis present

## 2022-04-08 DIAGNOSIS — R1900 Intra-abdominal and pelvic swelling, mass and lump, unspecified site: Secondary | ICD-10-CM | POA: Insufficient documentation

## 2022-04-08 MED ORDER — GADOBUTROL 1 MMOL/ML IV SOLN
5.0000 mL | Freq: Once | INTRAVENOUS | Status: AC | PRN
  Administered 2022-04-08: 5 mL via INTRAVENOUS

## 2022-04-11 ENCOUNTER — Encounter: Payer: Self-pay | Admitting: Medical Oncology

## 2022-04-11 ENCOUNTER — Inpatient Hospital Stay: Payer: Medicare Other | Admitting: Medical Oncology

## 2022-04-11 ENCOUNTER — Inpatient Hospital Stay: Payer: Medicare Other | Attending: Internal Medicine

## 2022-04-11 ENCOUNTER — Inpatient Hospital Stay: Payer: Medicare Other

## 2022-04-11 ENCOUNTER — Other Ambulatory Visit: Payer: Medicare Other

## 2022-04-11 ENCOUNTER — Ambulatory Visit: Payer: Medicare Other | Admitting: Internal Medicine

## 2022-04-11 VITALS — BP 168/80 | HR 78 | Temp 96.9°F | Wt 115.0 lb

## 2022-04-11 DIAGNOSIS — Z452 Encounter for adjustment and management of vascular access device: Secondary | ICD-10-CM | POA: Insufficient documentation

## 2022-04-11 DIAGNOSIS — C8581 Other specified types of non-Hodgkin lymphoma, lymph nodes of head, face, and neck: Secondary | ICD-10-CM | POA: Diagnosis not present

## 2022-04-11 DIAGNOSIS — Z8572 Personal history of non-Hodgkin lymphomas: Secondary | ICD-10-CM | POA: Diagnosis present

## 2022-04-11 DIAGNOSIS — E871 Hypo-osmolality and hyponatremia: Secondary | ICD-10-CM | POA: Insufficient documentation

## 2022-04-11 DIAGNOSIS — Z9221 Personal history of antineoplastic chemotherapy: Secondary | ICD-10-CM | POA: Insufficient documentation

## 2022-04-11 DIAGNOSIS — Z85820 Personal history of malignant melanoma of skin: Secondary | ICD-10-CM | POA: Insufficient documentation

## 2022-04-11 DIAGNOSIS — Z95828 Presence of other vascular implants and grafts: Secondary | ICD-10-CM

## 2022-04-11 DIAGNOSIS — M25561 Pain in right knee: Secondary | ICD-10-CM | POA: Diagnosis not present

## 2022-04-11 DIAGNOSIS — K862 Cyst of pancreas: Secondary | ICD-10-CM | POA: Diagnosis not present

## 2022-04-11 DIAGNOSIS — G620 Drug-induced polyneuropathy: Secondary | ICD-10-CM | POA: Insufficient documentation

## 2022-04-11 LAB — CBC WITH DIFFERENTIAL/PLATELET
Abs Immature Granulocytes: 0.02 10*3/uL (ref 0.00–0.07)
Basophils Absolute: 0 10*3/uL (ref 0.0–0.1)
Basophils Relative: 0 %
Eosinophils Absolute: 0.2 10*3/uL (ref 0.0–0.5)
Eosinophils Relative: 2 %
HCT: 48.3 % — ABNORMAL HIGH (ref 36.0–46.0)
Hemoglobin: 15.7 g/dL — ABNORMAL HIGH (ref 12.0–15.0)
Immature Granulocytes: 0 %
Lymphocytes Relative: 25 %
Lymphs Abs: 2.5 10*3/uL (ref 0.7–4.0)
MCH: 27.1 pg (ref 26.0–34.0)
MCHC: 32.5 g/dL (ref 30.0–36.0)
MCV: 83.3 fL (ref 80.0–100.0)
Monocytes Absolute: 0.9 10*3/uL (ref 0.1–1.0)
Monocytes Relative: 9 %
Neutro Abs: 6.2 10*3/uL (ref 1.7–7.7)
Neutrophils Relative %: 64 %
Platelets: 348 10*3/uL (ref 150–400)
RBC: 5.8 MIL/uL — ABNORMAL HIGH (ref 3.87–5.11)
RDW: 14.5 % (ref 11.5–15.5)
WBC: 9.7 10*3/uL (ref 4.0–10.5)
nRBC: 0 % (ref 0.0–0.2)

## 2022-04-11 LAB — COMPREHENSIVE METABOLIC PANEL
ALT: 17 U/L (ref 0–44)
AST: 24 U/L (ref 15–41)
Albumin: 4.3 g/dL (ref 3.5–5.0)
Alkaline Phosphatase: 84 U/L (ref 38–126)
Anion gap: 11 (ref 5–15)
BUN: 16 mg/dL (ref 8–23)
CO2: 26 mmol/L (ref 22–32)
Calcium: 9.3 mg/dL (ref 8.9–10.3)
Chloride: 95 mmol/L — ABNORMAL LOW (ref 98–111)
Creatinine, Ser: 0.69 mg/dL (ref 0.44–1.00)
GFR, Estimated: >60 mL/min (ref >60)
Glucose, Bld: 96 mg/dL (ref 70–99)
Potassium: 3.8 mmol/L (ref 3.5–5.1)
Sodium: 132 mmol/L — ABNORMAL LOW (ref 135–145)
Total Bilirubin: 0.7 mg/dL (ref 0.3–1.2)
Total Protein: 6.7 g/dL (ref 6.5–8.1)

## 2022-04-11 LAB — LIPID PANEL
Cholesterol: 196 mg/dL (ref 0–200)
HDL: 65 mg/dL (ref >40)
LDL Cholesterol: 118 mg/dL — ABNORMAL HIGH (ref 0–99)
Total CHOL/HDL Ratio: 3 RATIO
Triglycerides: 65 mg/dL (ref <150)
VLDL: 13 mg/dL (ref 0–40)

## 2022-04-11 LAB — HEMOGLOBIN A1C
Hgb A1c MFr Bld: 5.8 % — ABNORMAL HIGH (ref 4.8–5.6)
Mean Plasma Glucose: 119.76 mg/dL

## 2022-04-11 MED ORDER — SODIUM CHLORIDE 0.9% FLUSH
10.0000 mL | INTRAVENOUS | Status: DC | PRN
  Administered 2022-04-11: 10 mL via INTRAVENOUS
  Filled 2022-04-11: qty 10

## 2022-04-11 MED ORDER — HEPARIN SOD (PORK) LOCK FLUSH 100 UNIT/ML IV SOLN
500.0000 [IU] | Freq: Once | INTRAVENOUS | Status: AC
  Administered 2022-04-11: 500 [IU] via INTRAVENOUS
  Filled 2022-04-11: qty 5

## 2022-04-11 NOTE — Progress Notes (Signed)
[facility] NOTE  Patient Care Team: [name], MD as PCP - General (Internal Medicine) [name], MD as Consulting Physician (Oncology)  CHIEF COMPLAINTS/PURPOSE OF CONSULTATION: Lymphoma Oncology History Overview Note   # Incidental- MAY 2022-CT scan chest abdomen pelvis consistent with bulky retroperitoneal adenopathy [6-8 cm]; subpectoral/axillary lymphadenopathy 2 to 2.5 cm; PET June 2022-bulky right axillary/subpectoral; bulky retroperitoneal adenopathy.  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  Repeat MRI 3-6 m   # coloscopy [2022; [facility]]; mammogram- nov 2021-WNL.   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 .  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  Walking with a rolling walker.   Mary Washington 76 y.o.   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  Today she reports that she is doing well. She is excited to have Thanksgiving this year as she missed last year due to having her colostomy placed.   Appetite is good. Neuropathy is stable. No SOB, chest pain, lymphadenopathy. Pain is stable and well controlled. She is walking with a walker.  Starting PT for occasional right knee pain which she has had since her original diagnosis. Denies any new lumps or bumps.  Review of Systems  Constitutional:  Negative for chills, diaphoresis, fever and malaise/fatigue.  HENT:  Negative for nosebleeds and sore throat.   Eyes:  Negative for double vision.  Respiratory:  Negative for cough, hemoptysis, sputum production, shortness of breath and wheezing.   Cardiovascular:  Negative for chest pain, palpitations, orthopnea and leg swelling.  Gastrointestinal:  Negative for abdominal pain, blood in stool, diarrhea, heartburn, melena, nausea and vomiting.  Genitourinary:  Negative for dysuria, frequency and urgency.  Musculoskeletal:  Positive for joint pain.  Skin: Negative.  Negative for itching and rash.  Neurological:  Negative for dizziness, tingling, focal weakness, weakness and headaches.  Endo/Heme/Allergies:  Does not bruise/bleed easily.  Psychiatric/Behavioral:  Negative for depression. The patient is not nervous/anxious and does not have insomnia.      MEDICAL HISTORY:  Past Medical History:  Diagnosis Date   Anemia    Arthritis    Colon polyps    Colostomy in place [facility])    Complication of anesthesia    bp dropped with novacaine at dentist office and pt had to be admitted to  hospital for 24 hours   Diverticulosis    Family history of adverse reaction to anesthesia    brother-pt unsure of what reaction her brother had   GERD (gastroesophageal reflux disease)    Hyponatremia    Large cell lymphoma  of lymph nodes of neck ([facility])    Melanoma ([place]) 1988   lt upper arm, some lymph nodes removed   Mild intermittent asthma    well controlled   Osteoporosis    Pneumonia 2022   Tachycardia     SURGICAL HISTORY: Past Surgical History:  Procedure Laterality Date   BACK SURGERY     CATARACT EXTRACTION     COLONOSCOPY WITH PROPOFOL N/A 09/12/2021   Procedure: COLONOSCOPY WITH PROPOFOL;  Surgeon: Benjamine Sprague, DO;  Location: [facility] ENDOSCOPY;  Service: General;  Laterality: N/A;  Dr Lysle Pearl can be 12:30 PM  procedure start time   IR IMAGING GUIDED PORT INSERTION  11/23/2020   IR KYPHO THORACIC WITH BONE BIOPSY  08/12/2021   IR RADIOLOGIST EVAL & MGMT  07/23/2021   IR RADIOLOGIST EVAL & MGMT  09/11/2021   LAPAROSCOPIC LYSIS OF ADHESIONS  10/21/2021   Procedure: LAPAROSCOPIC LYSIS OF ADHESIONS;  Surgeon: Herbert Pun, MD;  Location: [facility] ORS;  Service: General;;   PARTIAL COLECTOMY N/A 04/24/2021   Procedure: PARTIAL COLECTOMY;  Surgeon: Herbert Pun, MD;  Location: [facility] ORS;  Service: General;  Laterality: N/A;   TONSILLECTOMY     VAGINAL HYSTERECTOMY  1990   XI ROBOTIC ASSISTED COLOSTOMY TAKEDOWN N/A 10/21/2021   Procedure: XI ROBOTIC ASSISTED COLOSTOMY TAKEDOWN;  Surgeon: Herbert Pun, MD;  Location: [facility] ORS;  Service: General;  Laterality: N/A;    SOCIAL HISTORY: Social History   Socioeconomic History   Marital status: Married    Spouse name: Mikki Santee   Number of children: 2   Years of education: Not on file   Highest education level: Not on file  Occupational History   Not on file  Tobacco Use   Smoking status: Never   Smokeless tobacco: Never  Vaping Use   Vaping Use: Never used  Substance and Sexual Activity   Alcohol use: Yes    Comment: rare   Drug use: Never   Sexual activity: Yes    Partners: Male  Other Topics Concern   Not on file  Social History Narrative   ** Merged History Encounter **       Lives at home with husband.    Social  Determinants of Health   Financial Resource Strain: Not on file  Food Insecurity: Not on file  Transportation Needs: Not on file  Physical Activity: Not on file  Stress: Not on file  Social Connections: Not on file  Intimate Partner Violence: Not on file    FAMILY HISTORY: Family History  Problem Relation Age of Onset   Breast cancer Neg Hx     ALLERGIES:  has No Known Allergies.  MEDICATIONS:  Current Outpatient Medications  Medication Sig Dispense Refill   acetaminophen (TYLENOL) 500 MG tablet Take 500 mg by mouth every 6 (six) hours as needed for moderate pain or mild pain.     Calcium Carb-Cholecalciferol 600-20 MG-MCG TABS Take 1 tablet by mouth daily. citracal     Cholecalciferol (VITAMIN D) 50 MCG (2000 UT) CAPS Take 2,000 Units by mouth daily.     diclofenac Sodium (VOLTAREN) 1 % GEL Apply 2 g topically 2 (two) times daily as needed (left leg pain).     ibandronate (BONIVA) 150 MG  tablet Take 150 mg by mouth every 30 (thirty) days. Take in the morning with a full glass of water, on an empty stomach, and do not take anything else by mouth or lie down for the next 30 min.     lidocaine-prilocaine (EMLA) cream 1 application. as needed (on her port).     Magnesium Oxide (MAG-OXIDE PO) Take 250 mg by mouth daily at 12 noon.     Multiple Vitamins-Minerals (WOMENS MULTIVITAMIN PO) Take 1 Tera Vector Genomes by mouth daily.     omeprazole (PRILOSEC) 20 MG capsule Take 20 mg by mouth every morning.     polyethylene glycol (MIRALAX / GLYCOLAX) 17 g packet Take 17 g by mouth daily.     ciprofloxacin (CIPRO) 500 MG tablet Take 500 mg by mouth 2 (two) times daily. (Patient not taking: Reported on 01/08/2022)     meloxicam (MOBIC) 15 MG tablet Take 15 mg by mouth daily. (Patient not taking: Reported on 01/08/2022)     metoprolol tartrate (LOPRESSOR) 25 MG tablet Take 1 tablet (25 mg total) by mouth 2 (two) times daily. 60 tablet 0   metroNIDAZOLE (FLAGYL) 500 MG tablet Take 500 mg by mouth  once. (Patient not taking: Reported on 01/08/2022)     No current facility-administered medications for this visit.   Facility-Administered Medications Ordered in Other Visits  Medication Dose Route Frequency Provider Last Rate Last Admin   sodium chloride flush (NS) 0.9 % injection 10 mL  10 mL Intravenous PRN [name], MD   10 mL at 01/07/21 0919      .  PHYSICAL EXAMINATION: ECOG PERFORMANCE STATUS: 1 - Symptomatic but completely ambulatory  Vitals:   04/11/22 1401  BP: (!) 168/80  Pulse: 78  Temp: (!) 96.9 F (36.1 C)    Filed Weights   04/11/22 1401  Weight: 115 lb (52.2 kg)      Physical Exam Constitutional:      Comments: Alone. Ambulating with a Rolator   HENT:     Head: Normocephalic and atraumatic.     Mouth/Throat:     Pharynx: No oropharyngeal exudate.  Eyes:     Pupils: Pupils are equal, round, and reactive to light.  Cardiovascular:     Rate and Rhythm: Normal rate and regular rhythm.  Pulmonary:     Effort: Pulmonary effort is normal. No respiratory distress.     Breath sounds: Normal breath sounds. No wheezing.  Abdominal:     General: Bowel sounds are normal. There is no distension.     Palpations: Abdomen is soft. There is no mass.     Tenderness: There is no abdominal tenderness. There is no guarding or rebound.  Musculoskeletal:        General: Normal range of motion.     Cervical back: Normal range of motion and neck supple.  Skin:    General: Skin is warm.  Neurological:     Mental Status: She is alert and oriented to person, place, and time.  Psychiatric:        Mood and Affect: Affect normal.     LABORATORY DATA:  I have reviewed the data as listed Lab Results  Component Value Date   WBC 9.7 04/11/2022   HGB 15.7 (H) 04/11/2022   HCT 48.3 (H) 04/11/2022   MCV 83.3 04/11/2022   PLT 348 04/11/2022   Recent Labs    07/08/21 1400 10/08/21 1327 10/17/21 1340 10/26/21 1120 10/28/21 1057 10/29/21 0425  01/08/22 1334  NA  127* 128*   < > 132* 130*  --  131*  K 4.4 4.1   < > 3.4* 3.5  --  3.9  CL 93* 95*   < > 100 98  --  97*  CO2 24 24   < > 24 24  --  25  GLUCOSE 131* 135*   < > 106* 124*  --  183*  BUN 17 22   < > <5* <5*  --  18  CREATININE 0.77 0.75   < > 0.42* 0.39* 0.45 0.79  CALCIUM 9.2 8.9   < > 8.5* 8.6*  --  9.2  GFRNONAA >60 >60   < > >60 >60 >60 >60  PROT 6.1* 6.3*  --   --   --   --  6.2*  ALBUMIN 3.8 3.9  --   --   --   --  3.9  AST 22 23  --   --   --   --  25  ALT 19 15  --   --   --   --  15  ALKPHOS 164* 106  --   --   --   --  76  BILITOT 0.2* 0.6  --   --   --   --  0.3   < > = values in this interval not displayed.     RADIOGRAPHIC STUDIES: I have personally reviewed the radiological images as listed and agreed with the findings in the report.   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  PET June 2022-bulky right axillary/subpectoral; bulky retroperitoneal adenopathy; s/p 5 cycles of R-CHOP; discontinued cycle #6 [complicated sigmoid diverticulitis]-FEB 3rd, 2023- PET scan July 06, 2020-no residual/recurrent lymphoma. Today she is doing well with no signs of relapse. CBC reassuring.    #Compression fractures -PET scan February 2023 incidental  multiple thoracolumbar compression fractures progressive T7-T8- ? Osteoporotic; NO BMD since 2016 [prior in [place], [place]]- s/p-kyphoplasty of thoracic vertebra x2 [IR]-pain improved not resolved.  She does not mention any pain related to this today.    #Complicated sigmoid diverticulitis [NOV 2022] s/p left hemicolectomy [Dr.Cintron]-  Patient s/p reversal of colostomy in MAY 22nd. 2023- STABLE.     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 She underwent an MR abdomen on 04/08/2022 which showed a stable to decreased size of the cyst of concern. They recommended a pre and post contrast  MRI/MRCP or pancreatic protocol CT in 1 year.    # Hyponatremia: Chronic in nature. Today sodium is 132- thought to be secondary to poor p.o. intake. No changes needed at this time.    #Peripheral neuropathy grade 1-secondary vincristine.Stable.  Monitor for now. Continue PT which she thinks may be helping.    # chronic back pain-likely secondary to lymphoma impinging the neuroforaminal nerve- Improved; # Left knee pain/stiff [s/p Evaluation Ortho]- on volatren gel- At this time symptoms are stable.    # Port/IV access- Port flush today.    # DISPOSITION: Port flush today # port flush in 6 weeks # follow up in 3 months- MD; port labs- cbc/cmp;LDH; port flush;    All questions were answered. The patient knows to call the clinic with any problems, questions or concerns.    Hughie Closs, PA-C 04/11/2022 2:06 PM

## 2022-04-17 DIAGNOSIS — R7303 Prediabetes: Secondary | ICD-10-CM | POA: Insufficient documentation

## 2022-04-18 ENCOUNTER — Encounter: Payer: Self-pay | Admitting: Internal Medicine

## 2022-05-20 ENCOUNTER — Other Ambulatory Visit: Payer: Self-pay | Admitting: Pharmacist

## 2022-05-23 ENCOUNTER — Inpatient Hospital Stay: Payer: Medicare Other | Attending: Internal Medicine

## 2022-05-23 DIAGNOSIS — Z95828 Presence of other vascular implants and grafts: Secondary | ICD-10-CM

## 2022-05-23 DIAGNOSIS — Z8572 Personal history of non-Hodgkin lymphomas: Secondary | ICD-10-CM | POA: Diagnosis present

## 2022-05-23 DIAGNOSIS — Z452 Encounter for adjustment and management of vascular access device: Secondary | ICD-10-CM | POA: Diagnosis present

## 2022-05-23 MED ORDER — SODIUM CHLORIDE 0.9% FLUSH
10.0000 mL | INTRAVENOUS | Status: DC | PRN
  Administered 2022-05-23: 10 mL via INTRAVENOUS
  Filled 2022-05-23: qty 10

## 2022-05-23 MED ORDER — HEPARIN SOD (PORK) LOCK FLUSH 100 UNIT/ML IV SOLN
500.0000 [IU] | Freq: Once | INTRAVENOUS | Status: AC
  Filled 2022-05-23: qty 5

## 2022-05-23 MED ORDER — HEPARIN SOD (PORK) LOCK FLUSH 100 UNIT/ML IV SOLN
INTRAVENOUS | Status: AC
  Administered 2022-05-23: 500 [IU] via INTRAVENOUS
  Filled 2022-05-23: qty 5

## 2022-07-03 ENCOUNTER — Other Ambulatory Visit: Payer: Self-pay

## 2022-07-03 DIAGNOSIS — C8581 Other specified types of non-Hodgkin lymphoma, lymph nodes of head, face, and neck: Secondary | ICD-10-CM

## 2022-07-04 ENCOUNTER — Inpatient Hospital Stay (HOSPITAL_BASED_OUTPATIENT_CLINIC_OR_DEPARTMENT_OTHER): Payer: Medicare Other | Admitting: Internal Medicine

## 2022-07-04 ENCOUNTER — Inpatient Hospital Stay: Payer: Medicare Other | Attending: Internal Medicine

## 2022-07-04 ENCOUNTER — Encounter: Payer: Self-pay | Admitting: Internal Medicine

## 2022-07-04 VITALS — BP 149/83 | HR 76 | Temp 96.5°F | Resp 16 | Wt 119.9 lb

## 2022-07-04 DIAGNOSIS — E871 Hypo-osmolality and hyponatremia: Secondary | ICD-10-CM | POA: Insufficient documentation

## 2022-07-04 DIAGNOSIS — C8581 Other specified types of non-Hodgkin lymphoma, lymph nodes of head, face, and neck: Secondary | ICD-10-CM

## 2022-07-04 DIAGNOSIS — Z9221 Personal history of antineoplastic chemotherapy: Secondary | ICD-10-CM | POA: Diagnosis not present

## 2022-07-04 DIAGNOSIS — Z8572 Personal history of non-Hodgkin lymphomas: Secondary | ICD-10-CM | POA: Insufficient documentation

## 2022-07-04 DIAGNOSIS — G629 Polyneuropathy, unspecified: Secondary | ICD-10-CM | POA: Insufficient documentation

## 2022-07-04 DIAGNOSIS — Z79899 Other long term (current) drug therapy: Secondary | ICD-10-CM | POA: Diagnosis not present

## 2022-07-04 LAB — COMPREHENSIVE METABOLIC PANEL
ALT: 17 U/L (ref 0–44)
AST: 28 U/L (ref 15–41)
Albumin: 4.2 g/dL (ref 3.5–5.0)
Alkaline Phosphatase: 76 U/L (ref 38–126)
Anion gap: 8 (ref 5–15)
BUN: 15 mg/dL (ref 8–23)
CO2: 25 mmol/L (ref 22–32)
Calcium: 9 mg/dL (ref 8.9–10.3)
Chloride: 97 mmol/L — ABNORMAL LOW (ref 98–111)
Creatinine, Ser: 0.72 mg/dL (ref 0.44–1.00)
GFR, Estimated: >60 mL/min (ref >60)
Glucose, Bld: 152 mg/dL — ABNORMAL HIGH (ref 70–99)
Potassium: 4.2 mmol/L (ref 3.5–5.1)
Sodium: 130 mmol/L — ABNORMAL LOW (ref 135–145)
Total Bilirubin: 0.4 mg/dL (ref 0.3–1.2)
Total Protein: 6.4 g/dL — ABNORMAL LOW (ref 6.5–8.1)

## 2022-07-04 LAB — CBC WITH DIFFERENTIAL/PLATELET
Abs Immature Granulocytes: 0.02 10*3/uL (ref 0.00–0.07)
Basophils Absolute: 0 10*3/uL (ref 0.0–0.1)
Basophils Relative: 0 %
Eosinophils Absolute: 0.2 10*3/uL (ref 0.0–0.5)
Eosinophils Relative: 3 %
HCT: 43.2 % (ref 36.0–46.0)
Hemoglobin: 14.6 g/dL (ref 12.0–15.0)
Immature Granulocytes: 0 %
Lymphocytes Relative: 34 %
Lymphs Abs: 3 10*3/uL (ref 0.7–4.0)
MCH: 28.1 pg (ref 26.0–34.0)
MCHC: 33.8 g/dL (ref 30.0–36.0)
MCV: 83.1 fL (ref 80.0–100.0)
Monocytes Absolute: 0.7 10*3/uL (ref 0.1–1.0)
Monocytes Relative: 8 %
Neutro Abs: 5 10*3/uL (ref 1.7–7.7)
Neutrophils Relative %: 55 %
Platelets: 289 10*3/uL (ref 150–400)
RBC: 5.2 MIL/uL — ABNORMAL HIGH (ref 3.87–5.11)
RDW: 14 % (ref 11.5–15.5)
WBC: 9 10*3/uL (ref 4.0–10.5)
nRBC: 0 % (ref 0.0–0.2)

## 2022-07-04 LAB — LACTATE DEHYDROGENASE: LDH: 134 U/L (ref 98–192)

## 2022-07-04 MED ORDER — HEPARIN SOD (PORK) LOCK FLUSH 100 UNIT/ML IV SOLN
500.0000 [IU] | Freq: Once | INTRAVENOUS | Status: AC
  Administered 2022-07-04: 500 [IU] via INTRAVENOUS
  Filled 2022-07-04: qty 5

## 2022-07-04 MED ORDER — SODIUM CHLORIDE 0.9% FLUSH
10.0000 mL | Freq: Once | INTRAVENOUS | Status: AC
  Administered 2022-07-04: 10 mL via INTRAVENOUS
  Filled 2022-07-04: qty 10

## 2022-07-04 NOTE — Assessment & Plan Note (Addendum)
#  Diffuse large B-cell lymphoma-stage III.  PET June 2022-bulky right axillary/subpectoral; bulky retroperitoneal adenopathy; s/p 5 cycles of R-CHOP; discontinued cycle #6 [complicated sigmoid diverticulitis]-FEB 3rd, 2023- PET scan February 4,2022-no residual/recurrent lymphoma.   #Continue clinical surveillance at this time.  We will plan imaging based on symptoms; stable.   #back pain/ Compression fractures -PET scan February 2023 incidental  multiple thoracolumbar compression fractures progressive T7-T8- ? Osteoporotic;  s/p-kyphoplasty of thoracic vertebra x2 [IR]-pain improved not resolved. BMD- April 2023- On Boniva [PCP;]S/p PT - Monitor for now-stable.   #Complicated sigmoid diverticulitis [NOV 2022] s/p left hemicolectomy [Dr.Cintron]-  Patient awaiting s/p reversal of colostomy in MAY 22nd. 2023- stable.     # May 2022-Pancreatic head/uncinate cyst [incidental]- MRI NOV 2024- Stable to slight decreased size of the cystic pancreatic lesions both of which appear to demonstrate main ductal communication without suspicious MRI features. Compatible with side branch IPMNs. Recommend follow up pre and post contrast MRI/MRCP or pancreatic protocol CT in 1 year- Nov 2024.   # Hyponatremia sodium 131- chronic- 130s- stable-likely poor p.o. intake.; increased salt intake-  Stable.   #Peripheral neuropathy grade 1-secondary vincristine.  Monitor for now-stable.   # Port/IV access- Stable; discussed re: pro and cons of keeping the port vs. Explantation; Poor IV access-keep for now.   [phone] [home]  # DISPOSITION: # port flush in 2 months/ and 4 months # follow up in 6 months- MD; labs- cbc/cmp;LDH; port flush; Dr.B

## 2022-07-04 NOTE — Progress Notes (Signed)
[facility] NOTE  Patient Care Team: [name], MD as PCP - General (Internal Medicine) [name], MD as Consulting Physician (Oncology)  CHIEF COMPLAINTS/PURPOSE OF CONSULTATION: Lymphoma Oncology History Overview Note   # Incidental- MAY 2022-CT scan chest abdomen pelvis consistent with bulky retroperitoneal adenopathy [6-8 cm]; subpectoral/axillary lymphadenopathy 2 to 2.5 cm; PET June 2022-bulky right axillary/subpectoral; bulky retroperitoneal adenopathy.  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  Repeat MRI 3-6 m   # coloscopy [2022; [facility]]; mammogram- nov 2021-WNL.   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 .  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  Walking independently.   Mary Washington 77 y.o.   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  Patient is currently home.   No worsening shortness of breath or cough. Appetite is good.  She is walking with a walker.  Denies any new lumps or bumps.  Review of Systems  Constitutional:  Positive for malaise/fatigue. Negative for chills, diaphoresis and fever.  HENT:  Negative for nosebleeds and sore throat.   Eyes:  Negative for double vision.  Respiratory:  Negative for cough, hemoptysis, sputum production, shortness of breath and wheezing.   Cardiovascular:  Negative for chest pain, palpitations, orthopnea and leg swelling.  Gastrointestinal:  Negative for abdominal pain, blood in stool, diarrhea, heartburn, melena, nausea and vomiting.  Genitourinary:  Negative for dysuria, frequency and urgency.  Musculoskeletal:  Positive for joint pain.  Skin: Negative.  Negative for itching and rash.  Neurological:  Negative for dizziness, tingling, focal weakness, weakness and headaches.  Endo/Heme/Allergies:  Does not bruise/bleed easily.  Psychiatric/Behavioral:  Negative for depression. The patient is not nervous/anxious and does not have insomnia.      MEDICAL HISTORY:  Past Medical History:  Diagnosis Date   Anemia    Arthritis    Colon polyps    Colostomy in place [facility])    Complication of anesthesia    bp dropped with novacaine at dentist office and pt had to be admitted to hospital for 24 hours   Diverticulosis    Family history of adverse reaction to anesthesia    brother-pt unsure of what reaction her brother had   GERD (gastroesophageal reflux disease)    Hyponatremia    Large cell lymphoma of  lymph nodes of neck ([place])    Melanoma ([place]) 1988   lt upper arm, some lymph nodes removed   Mild intermittent asthma    well controlled   Osteoporosis    Pneumonia 2022   Tachycardia     SURGICAL HISTORY: Past Surgical History:   Procedure Laterality Date   BACK SURGERY     CATARACT EXTRACTION     COLONOSCOPY WITH PROPOFOL N/A 09/12/2021   Procedure: COLONOSCOPY WITH PROPOFOL;  Surgeon: Benjamine Sprague, DO;  Location: [facility] ENDOSCOPY;  Service: General;  Laterality: N/A;  Dr Lysle Pearl can be 12:30 PM  procedure start time   IR IMAGING GUIDED PORT INSERTION  11/23/2020   IR KYPHO THORACIC WITH BONE BIOPSY  08/12/2021   IR RADIOLOGIST EVAL & MGMT  07/23/2021   IR RADIOLOGIST EVAL & MGMT  09/11/2021   LAPAROSCOPIC LYSIS OF ADHESIONS  10/21/2021   Procedure: LAPAROSCOPIC LYSIS OF ADHESIONS;  Surgeon: Herbert Pun, MD;  Location: [facility] ORS;  Service: General;;   PARTIAL COLECTOMY N/A 04/24/2021   Procedure: PARTIAL COLECTOMY;  Surgeon: Herbert Pun, MD;  Location: [facility] ORS;  Service: General;  Laterality: N/A;   TONSILLECTOMY     VAGINAL HYSTERECTOMY  1990   XI ROBOTIC ASSISTED COLOSTOMY TAKEDOWN N/A 10/21/2021   Procedure: XI ROBOTIC ASSISTED COLOSTOMY TAKEDOWN;  Surgeon: Herbert Pun, MD;  Location: [facility] ORS;  Service: General;  Laterality: N/A;    SOCIAL HISTORY: Social History   Socioeconomic History   Marital status: Married    Spouse name: Mikki Santee   Number of children: 2   Years of education: Not on file   Highest education level: Not on file  Occupational History   Not on file  Tobacco Use   Smoking status: Never   Smokeless tobacco: Never  Vaping Use   Vaping Use: Never used  Substance and Sexual Activity   Alcohol use: Yes    Comment: rare   Drug use: Never   Sexual activity: Yes    Partners: Male  Other Topics Concern   Not on file  Social History Narrative   ** Merged History Encounter **       Lives at home with husband.    Social Determinants of Health   Financial Resource Strain: Not on file  Food Insecurity: Not on file  Transportation Needs: Not on file  Physical Activity: Not on file  Stress: Not on file  Social Connections: Not on file  Intimate Partner Violence:  Not on file    FAMILY HISTORY: Family History  Problem Relation Age of Onset   Breast cancer Neg Hx     ALLERGIES:  has No Known Allergies.  MEDICATIONS:  Current Outpatient Medications  Medication Sig Dispense Refill   acetaminophen (TYLENOL) 500 MG tablet Take 500 mg by mouth every 6 (six) hours as needed for moderate pain or mild pain.     Calcium Carb-Cholecalciferol 600-20 MG-MCG TABS Take 1 tablet by mouth daily. citracal     Cholecalciferol (VITAMIN D) 50 MCG (2000 UT) CAPS Take 2,000 Units by mouth daily.     diclofenac Sodium (VOLTAREN) 1 % GEL Apply 2 g topically 2 (two) times daily as needed (left leg pain).     ibandronate (BONIVA) 150 MG tablet Take 150 mg by mouth every 30 (thirty) days. Take in the morning with a full glass of water, on an empty stomach, and do not take anything else by mouth or lie down for the next 30 min.     lidocaine-prilocaine (  EMLA) cream 1 application. as needed (on her port).     Magnesium Oxide (MAG-OXIDE PO) Take 250 mg by mouth daily at 12 noon.     metoprolol tartrate (LOPRESSOR) 25 MG tablet Take 1 tablet (25 mg total) by mouth 2 (two) times daily. 60 tablet 0   Multiple Vitamins-Minerals (WOMENS MULTIVITAMIN PO) Take 1 Tera Vector Genomes by mouth daily.     omeprazole (PRILOSEC) 20 MG capsule Take 20 mg by mouth every morning.     polyethylene glycol (MIRALAX / GLYCOLAX) 17 g packet Take 17 g by mouth daily.     No current facility-administered medications for this visit.   Facility-Administered Medications Ordered in Other Visits  Medication Dose Route Frequency Provider Last Rate Last Admin   sodium chloride flush (NS) 0.9 % injection 10 mL  10 mL Intravenous PRN [name], MD   10 mL at 01/07/21 0919      .  PHYSICAL EXAMINATION: ECOG PERFORMANCE STATUS: 1 - Symptomatic but completely ambulatory  Vitals:   07/04/22 1400  BP: (!) 149/83  Pulse: 76  Resp: 16  Temp: (!) 96.5 F (35.8 C)    Filed Weights    07/04/22 1400  Weight: 119 lb 14.4 oz (54.4 kg)      Physical Exam HENT:     Head: Normocephalic and atraumatic.     Mouth/Throat:     Pharynx: No oropharyngeal exudate.  Eyes:     Pupils: Pupils are equal, round, and reactive to light.  Cardiovascular:     Rate and Rhythm: Normal rate and regular rhythm.  Pulmonary:     Effort: Pulmonary effort is normal. No respiratory distress.     Breath sounds: Normal breath sounds. No wheezing.  Abdominal:     General: Bowel sounds are normal. There is no distension.     Palpations: Abdomen is soft. There is no mass.     Tenderness: There is no abdominal tenderness. There is no guarding or rebound.  Musculoskeletal:        General: Normal range of motion.     Cervical back: Normal range of motion and neck supple.  Skin:    General: Skin is warm.  Neurological:     Mental Status: She is alert and oriented to person, place, and time.  Psychiatric:        Mood and Affect: Affect normal.      LABORATORY DATA:  I have reviewed the data as listed Lab Results  Component Value Date   WBC 9.0 07/04/2022   HGB 14.6 07/04/2022   HCT 43.2 07/04/2022   MCV 83.1 07/04/2022   PLT 289 07/04/2022   Recent Labs    01/08/22 1334 04/11/22 1344 07/04/22 1415  NA 131* 132* 130*  K 3.9 3.8 4.2  CL 97* 95* 97*  CO2 '25 26 25  '$ GLUCOSE 183* 96 152*  BUN '18 16 15  '$ CREATININE 0.79 0.69 0.72  CALCIUM 9.2 9.3 9.0  GFRNONAA >60 >60 >60  PROT 6.2* 6.7 6.4*  ALBUMIN 3.9 4.3 4.2  AST '25 24 28  '$ ALT '15 17 17  '$ ALKPHOS 76 84 76  BILITOT 0.3 0.7 0.4    RADIOGRAPHIC STUDIES: I have personally reviewed the radiological images as listed and agreed with the findings in the report.   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  PET June 2022-bulky right axillary/subpectoral; bulky retroperitoneal adenopathy; s/p 5 cycles of R-CHOP;  discontinued cycle #6 [complicated sigmoid  diverticulitis]-FEB 3rd, 2023- PET scan February 4,2022-no residual/recurrent lymphoma.   #Continue clinical surveillance at this time.  We will plan imaging based on symptoms; stable.   #back pain/ Compression fractures -PET scan February 2023 incidental  multiple thoracolumbar compression fractures progressive T7-T8- ? Osteoporotic;  s/p-kyphoplasty of thoracic vertebra x2 [IR]-pain improved not resolved. BMD- April 2023- On Boniva [PCP;]S/p PT - Monitor for now-stable.   #Complicated sigmoid diverticulitis [NOV 2022] s/p left hemicolectomy [Dr.Cintron]-  Patient awaiting s/p reversal of colostomy in MAY 22nd. 2023- stable.     # May 2022-Pancreatic head/uncinate cyst [incidental]- MRI NOV 2024- Stable to slight decreased size of the cystic pancreatic lesions both of which appear to demonstrate main ductal communication without suspicious MRI features. Compatible with side branch IPMNs. Recommend follow up pre and post contrast MRI/MRCP or pancreatic protocol CT in 1 year- Nov 2024.   # Hyponatremia sodium 131- chronic- 130s- stable-likely poor p.o. intake.; increased salt intake-  Stable.   #Peripheral neuropathy grade 1-secondary vincristine.  Monitor for now-stable.   # Port/IV access- Stable; discussed re: pro and cons of keeping the port vs. Explantation; Poor IV access-keep for now.   [phone] [home]  # DISPOSITION: # port flush in 2 months/ and 4 months # follow up in 6 months- MD; labs- cbc/cmp;LDH; port flush; Dr.B   All questions were answered. The patient knows to call the clinic with any problems, questions or concerns.    [name], MD 07/04/2022 2:47 PM

## 2022-07-04 NOTE — Progress Notes (Signed)
Patient denies new problems/concerns today.   

## 2022-08-15 ENCOUNTER — Inpatient Hospital Stay: Payer: Medicare Other

## 2022-09-02 ENCOUNTER — Inpatient Hospital Stay: Payer: Medicare Other

## 2022-09-02 ENCOUNTER — Inpatient Hospital Stay: Payer: Medicare Other | Attending: Internal Medicine

## 2022-09-02 DIAGNOSIS — Z452 Encounter for adjustment and management of vascular access device: Secondary | ICD-10-CM | POA: Diagnosis present

## 2022-09-02 DIAGNOSIS — Z8572 Personal history of non-Hodgkin lymphomas: Secondary | ICD-10-CM | POA: Diagnosis present

## 2022-09-02 DIAGNOSIS — Z95828 Presence of other vascular implants and grafts: Secondary | ICD-10-CM

## 2022-09-02 MED ORDER — HEPARIN SOD (PORK) LOCK FLUSH 100 UNIT/ML IV SOLN
INTRAVENOUS | Status: AC
  Administered 2022-09-02: 500 [IU] via INTRAVENOUS
  Filled 2022-09-02: qty 5

## 2022-09-02 MED ORDER — HEPARIN SOD (PORK) LOCK FLUSH 100 UNIT/ML IV SOLN
500.0000 [IU] | Freq: Once | INTRAVENOUS | Status: AC
  Filled 2022-09-02: qty 5

## 2022-09-02 MED ORDER — SODIUM CHLORIDE 0.9% FLUSH
10.0000 mL | Freq: Once | INTRAVENOUS | Status: AC
  Administered 2022-09-02: 10 mL via INTRAVENOUS
  Filled 2022-09-02: qty 10

## 2022-09-26 ENCOUNTER — Inpatient Hospital Stay: Payer: Medicare Other

## 2022-11-04 ENCOUNTER — Other Ambulatory Visit: Payer: Self-pay | Admitting: *Deleted

## 2022-11-04 ENCOUNTER — Inpatient Hospital Stay: Payer: Medicare Other | Attending: Internal Medicine

## 2022-11-04 ENCOUNTER — Inpatient Hospital Stay: Payer: Medicare Other

## 2022-11-04 DIAGNOSIS — Z95828 Presence of other vascular implants and grafts: Secondary | ICD-10-CM

## 2022-11-04 DIAGNOSIS — Z8572 Personal history of non-Hodgkin lymphomas: Secondary | ICD-10-CM | POA: Insufficient documentation

## 2022-11-04 DIAGNOSIS — Z452 Encounter for adjustment and management of vascular access device: Secondary | ICD-10-CM | POA: Insufficient documentation

## 2022-11-04 DIAGNOSIS — C8581 Other specified types of non-Hodgkin lymphoma, lymph nodes of head, face, and neck: Secondary | ICD-10-CM

## 2022-11-04 MED ORDER — HEPARIN SOD (PORK) LOCK FLUSH 100 UNIT/ML IV SOLN
500.0000 [IU] | Freq: Once | INTRAVENOUS | Status: AC
  Administered 2022-11-04: 500 [IU] via INTRAVENOUS
  Filled 2022-11-04: qty 5

## 2022-11-04 MED ORDER — SODIUM CHLORIDE 0.9% FLUSH
10.0000 mL | Freq: Once | INTRAVENOUS | Status: AC
  Administered 2022-11-04: 10 mL via INTRAVENOUS
  Filled 2022-11-04: qty 10

## 2022-11-07 ENCOUNTER — Inpatient Hospital Stay: Payer: Medicare Other

## 2022-12-02 ENCOUNTER — Other Ambulatory Visit: Payer: Self-pay | Admitting: Internal Medicine

## 2022-12-02 DIAGNOSIS — Z1231 Encounter for screening mammogram for malignant neoplasm of breast: Secondary | ICD-10-CM

## 2022-12-19 ENCOUNTER — Inpatient Hospital Stay: Payer: Medicare Other

## 2023-01-02 ENCOUNTER — Encounter: Payer: Self-pay | Admitting: Internal Medicine

## 2023-01-02 ENCOUNTER — Inpatient Hospital Stay: Payer: Medicare Other | Attending: Internal Medicine | Admitting: Internal Medicine

## 2023-01-02 ENCOUNTER — Inpatient Hospital Stay: Payer: Medicare Other | Attending: Internal Medicine

## 2023-01-02 VITALS — BP 150/76 | HR 74 | Temp 96.4°F | Ht 59.0 in | Wt 121.8 lb

## 2023-01-02 DIAGNOSIS — K862 Cyst of pancreas: Secondary | ICD-10-CM

## 2023-01-02 DIAGNOSIS — C8581 Other specified types of non-Hodgkin lymphoma, lymph nodes of head, face, and neck: Secondary | ICD-10-CM

## 2023-01-02 DIAGNOSIS — Z8572 Personal history of non-Hodgkin lymphomas: Secondary | ICD-10-CM | POA: Diagnosis present

## 2023-01-02 DIAGNOSIS — Z452 Encounter for adjustment and management of vascular access device: Secondary | ICD-10-CM | POA: Insufficient documentation

## 2023-01-02 DIAGNOSIS — Z95828 Presence of other vascular implants and grafts: Secondary | ICD-10-CM

## 2023-01-02 LAB — CBC WITH DIFFERENTIAL (CANCER CENTER ONLY)
Abs Immature Granulocytes: 0.01 10*3/uL (ref 0.00–0.07)
Basophils Absolute: 0 10*3/uL (ref 0.0–0.1)
Basophils Relative: 0 %
Eosinophils Absolute: 0.2 10*3/uL (ref 0.0–0.5)
Eosinophils Relative: 2 %
HCT: 44.3 % (ref 36.0–46.0)
Hemoglobin: 15 g/dL (ref 12.0–15.0)
Immature Granulocytes: 0 %
Lymphocytes Relative: 39 %
Lymphs Abs: 2.7 10*3/uL (ref 0.7–4.0)
MCH: 28.6 pg (ref 26.0–34.0)
MCHC: 33.9 g/dL (ref 30.0–36.0)
MCV: 84.4 fL (ref 80.0–100.0)
Monocytes Absolute: 0.7 10*3/uL (ref 0.1–1.0)
Monocytes Relative: 10 %
Neutro Abs: 3.4 10*3/uL (ref 1.7–7.7)
Neutrophils Relative %: 49 %
Platelet Count: 266 10*3/uL (ref 150–400)
RBC: 5.25 MIL/uL — ABNORMAL HIGH (ref 3.87–5.11)
RDW: 13.7 % (ref 11.5–15.5)
WBC Count: 7 10*3/uL (ref 4.0–10.5)
nRBC: 0 % (ref 0.0–0.2)

## 2023-01-02 LAB — CMP (CANCER CENTER ONLY)
ALT: 15 U/L (ref 0–44)
AST: 22 U/L (ref 15–41)
Albumin: 3.9 g/dL (ref 3.5–5.0)
Alkaline Phosphatase: 75 U/L (ref 38–126)
Anion gap: 10 (ref 5–15)
BUN: 17 mg/dL (ref 8–23)
CO2: 23 mmol/L (ref 22–32)
Calcium: 8.9 mg/dL (ref 8.9–10.3)
Chloride: 98 mmol/L (ref 98–111)
Creatinine: 0.63 mg/dL (ref 0.44–1.00)
GFR, Estimated: >60 mL/min (ref >60)
Glucose, Bld: 100 mg/dL — ABNORMAL HIGH (ref 70–99)
Potassium: 4 mmol/L (ref 3.5–5.1)
Sodium: 131 mmol/L — ABNORMAL LOW (ref 135–145)
Total Bilirubin: 0.7 mg/dL (ref 0.3–1.2)
Total Protein: 6.4 g/dL — ABNORMAL LOW (ref 6.5–8.1)

## 2023-01-02 LAB — LACTATE DEHYDROGENASE: LDH: 130 U/L (ref 98–192)

## 2023-01-02 MED ORDER — SODIUM CHLORIDE 0.9% FLUSH
10.0000 mL | Freq: Once | INTRAVENOUS | Status: AC
  Administered 2023-01-02: 10 mL via INTRAVENOUS
  Filled 2023-01-02: qty 10

## 2023-01-02 MED ORDER — HEPARIN SOD (PORK) LOCK FLUSH 100 UNIT/ML IV SOLN
500.0000 [IU] | Freq: Once | INTRAVENOUS | Status: AC
  Administered 2023-01-02: 500 [IU] via INTRAVENOUS
  Filled 2023-01-02: qty 5

## 2023-01-02 NOTE — Progress Notes (Signed)
[facility] CONSULT NOTE  Patient Care Team: Lauro Regulus, MD as PCP - General (Internal Medicine) Mary Coder, MD as Consulting Physician (Oncology)  CHIEF COMPLAINTS/PURPOSE OF CONSULTATION: Lymphoma Oncology History Overview Note   # Incidental- MAY 2022-CT scan chest abdomen pelvis consistent with bulky retroperitoneal adenopathy [6-8 cm]; subpectoral/axillary lymphadenopathy 2 to 2.5 cm; PET June 2022-bulky right axillary/subpectoral; bulky retroperitoneal adenopathy.  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  Repeat MRI 3-6 m   # coloscopy [2022; [facility]]; mammogram- nov 2021-WNL.   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 .  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  Walking independently.   Mary Washington 77 y.o.   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  Patient is currently home.   No worsening shortness of breath or cough. Appetite is good.  She is walking with a walker.  Denies any new lumps or bumps.  Review of Systems  Constitutional:  Positive for malaise/fatigue. Negative for chills, diaphoresis and fever.  HENT:  Negative for nosebleeds and sore throat.   Eyes:  Negative for double vision.  Respiratory:  Negative for cough, hemoptysis, sputum production, shortness of breath and wheezing.   Cardiovascular:  Negative for chest pain, palpitations, orthopnea and leg swelling.  Gastrointestinal:  Negative for abdominal pain, blood in stool, diarrhea, heartburn, melena, nausea and vomiting.  Genitourinary:  Negative for dysuria, frequency and urgency.  Musculoskeletal:  Positive for joint pain.  Skin: Negative.  Negative for itching and rash.  Neurological:  Negative for dizziness, tingling, focal weakness, weakness and headaches.  Endo/Heme/Allergies:  Does not bruise/bleed easily.  Psychiatric/Behavioral:  Negative for depression. The patient is not nervous/anxious and does not have insomnia.      MEDICAL HISTORY:  Past Medical History:  Diagnosis Date   Anemia    Arthritis    Colon polyps    Colostomy in place [facility])    Complication of anesthesia    bp dropped with novacaine at dentist office and pt had to be admitted to hospital for 24 hours   Diverticulosis    Family history of adverse reaction to anesthesia    brother-pt unsure of what reaction her brother had   GERD (gastroesophageal reflux disease)    Hyponatremia    Large cell lymphoma of  lymph nodes of neck ([facility])    Melanoma ([facility]) 1988   lt upper arm, some lymph nodes removed   Mild intermittent asthma    well controlled   Osteoporosis    Pneumonia 2022   Tachycardia     SURGICAL HISTORY: Past Surgical History:   Procedure Laterality Date   BACK SURGERY     CATARACT EXTRACTION     COLONOSCOPY WITH PROPOFOL N/A 09/12/2021   Procedure: COLONOSCOPY WITH PROPOFOL;  Surgeon: Sung Amabile, DO;  Location: [facility] ENDOSCOPY;  Service: General;  Laterality: N/A;  Dr Tonna Boehringer can be 12:30 PM  procedure start time   IR IMAGING GUIDED PORT INSERTION  11/23/2020   IR KYPHO THORACIC WITH BONE BIOPSY  08/12/2021   IR RADIOLOGIST EVAL & MGMT  07/23/2021   IR RADIOLOGIST EVAL & MGMT  09/11/2021   LAPAROSCOPIC LYSIS OF ADHESIONS  10/21/2021   Procedure: LAPAROSCOPIC LYSIS OF ADHESIONS;  Surgeon: Carolan Shiver, MD;  Location: [facility] ORS;  Service: General;;   PARTIAL COLECTOMY N/A 04/24/2021   Procedure: PARTIAL COLECTOMY;  Surgeon: Carolan Shiver, MD;  Location: [facility] ORS;  Service: General;  Laterality: N/A;   TONSILLECTOMY     VAGINAL HYSTERECTOMY  1990   XI ROBOTIC ASSISTED COLOSTOMY TAKEDOWN N/A 10/21/2021   Procedure: XI ROBOTIC ASSISTED COLOSTOMY TAKEDOWN;  Surgeon: Carolan Shiver, MD;  Location: [facility] ORS;  Service: General;  Laterality: N/A;    SOCIAL HISTORY: Social History   Socioeconomic History   Marital status: Married    Spouse name: Nadine Counts   Number of children: 2   Years of education: Not on file   Highest education level: Not on file  Occupational History   Not on file  Tobacco Use   Smoking status: Never   Smokeless tobacco: Never  Vaping Use   Vaping status: Never Used  Substance and Sexual Activity   Alcohol use: Yes    Comment: rare   Drug use: Never   Sexual activity: Yes    Partners: Male  Other Topics Concern   Not on file  Social History Narrative   ** Merged History Encounter **       Lives at home with husband.    Social Determinants of Health   Financial Resource Strain: Not on file  Food Insecurity: Not on file  Transportation Needs: Not on file  Physical Activity: Not on file  Stress: Not on file  Social Connections: Not on file  Intimate Partner  Violence: Not on file    FAMILY HISTORY: Family History  Problem Relation Age of Onset   Breast cancer Neg Hx     ALLERGIES:  has No Known Allergies.  MEDICATIONS:  Current Outpatient Medications  Medication Sig Dispense Refill   acetaminophen (TYLENOL) 500 MG tablet Take 500 mg by mouth every 6 (six) hours as needed for moderate pain or mild pain.     Calcium Carb-Cholecalciferol 600-20 MG-MCG TABS Take 1 tablet by mouth daily. citracal     Cholecalciferol (VITAMIN D) 50 MCG (2000 UT) CAPS Take 2,000 Units by mouth daily.     diclofenac Sodium (VOLTAREN) 1 % GEL Apply 2 g topically 2 (two) times daily as needed (left leg pain).     ibandronate (BONIVA) 150 MG tablet Take 150 mg by mouth every 30 (thirty) days. Take in the morning with a full glass of water, on an empty stomach, and do not take anything else by mouth or lie down for the next 30 min.     lidocaine-prilocaine (  EMLA) cream 1 application. as needed (on her port).     Magnesium Oxide (MAG-OXIDE PO) Take 250 mg by mouth daily at 12 noon.     Multiple Vitamins-Minerals (WOMENS MULTIVITAMIN PO) Take 1 Tera Vector Genomes by mouth daily.     omeprazole (PRILOSEC) 20 MG capsule Take 20 mg by mouth every morning.     polyethylene glycol (MIRALAX / GLYCOLAX) 17 g packet Take 17 g by mouth daily.     metoprolol tartrate (LOPRESSOR) 25 MG tablet Take 1 tablet (25 mg total) by mouth 2 (two) times daily. 60 tablet 0   No current facility-administered medications for this visit.   Facility-Administered Medications Ordered in Other Visits  Medication Dose Route Frequency Provider Last Rate Last Admin   sodium chloride flush (NS) 0.9 % injection 10 mL  10 mL Intravenous PRN Mary Coder, MD   10 mL at 01/07/21 0919      .  PHYSICAL EXAMINATION: ECOG PERFORMANCE STATUS: 1 - Symptomatic but completely ambulatory  Vitals:   01/02/23 1404  BP: (!) 150/76  Pulse: 74  Temp: (!) 96.4 F (35.8 C)  SpO2: 99%    Filed  Weights   01/02/23 1404  Weight: 121 lb 12.8 oz (55.2 kg)      Physical Exam HENT:     Head: Normocephalic and atraumatic.     Mouth/Throat:     Pharynx: No oropharyngeal exudate.  Eyes:     Pupils: Pupils are equal, round, and reactive to light.  Cardiovascular:     Rate and Rhythm: Normal rate and regular rhythm.  Pulmonary:     Effort: Pulmonary effort is normal. No respiratory distress.     Breath sounds: Normal breath sounds. No wheezing.  Abdominal:     General: Bowel sounds are normal. There is no distension.     Palpations: Abdomen is soft. There is no mass.     Tenderness: There is no abdominal tenderness. There is no guarding or rebound.  Musculoskeletal:        General: Normal range of motion.     Cervical back: Normal range of motion and neck supple.  Skin:    General: Skin is warm.  Neurological:     Mental Status: She is alert and oriented to person, place, and time.  Psychiatric:        Mood and Affect: Affect normal.      LABORATORY DATA:  I have reviewed the data as listed Lab Results  Component Value Date   WBC 9.0 07/04/2022   HGB 14.6 07/04/2022   HCT 43.2 07/04/2022   MCV 83.1 07/04/2022   PLT 289 07/04/2022   Recent Labs    01/08/22 1334 04/11/22 1344 07/04/22 1415  NA 131* 132* 130*  K 3.9 3.8 4.2  CL 97* 95* 97*  CO2 25 26 25   GLUCOSE 183* 96 152*  BUN 18 16 15   CREATININE 0.79 0.69 0.72  CALCIUM 9.2 9.3 9.0  GFRNONAA >60 >60 >60  PROT 6.2* 6.7 6.4*  ALBUMIN 3.9 4.3 4.2  AST 25 24 28   ALT 15 17 17   ALKPHOS 76 84 76  BILITOT 0.3 0.7 0.4    RADIOGRAPHIC STUDIES: I have personally reviewed the radiological images as listed and agreed with the findings in the report.   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  PET June 2022-bulky right axillary/subpectoral; bulky retroperitoneal adenopathy; s/p 5 cycles of R-CHOP;  discontinued cycle #6 [complicated  sigmoid diverticulitis]-FEB 3rd, 2023- PET scan February 4,2022-no residual/recurrent lymphoma.   #No clinical evidence of recurrence. Continue clinical surveillance at this time.  We will plan imaging based on symptoms; stable.   #back pain/ Compression fractures -PET scan February 2023 incidental  multiple thoracolumbar compression fractures progressive T7-T8- ? Osteoporotic;  s/p-kyphoplasty of thoracic vertebra x2 [IR]-pain improved not resolved. BMD- April 2023- On Boniva [PCP;]S/p PT - stable.   #Complicated sigmoid diverticulitis [NOV 2022] s/p left hemicolectomy [Dr.Cintron]-  Patient awaiting s/p reversal of colostomy in MAY 22nd. 2023- stable.     # May 2022-Pancreatic head/uncinate cyst [incidental]- MRI NOV 2023- Stable to slight decreased size of the cystic pancreatic lesions both of which appear to demonstrate main ductal communication without suspicious MRI features. Compatible with side branch IPMNs. Recommend follow up pre and post contrast MRI/MRCP or pancreatic protocol CT in 1 year- Nov 2024.   # Hyponatremia sodium 131- chronic- 130s- stable-likely poor p.o. intake.; increased salt intake-  Stable.   #Peripheral neuropathy grade 1-secondary vincristine.  Monitor for now-stable.   # Port/IV access- Stable; discussed re: pro and cons of keeping the port vs. Explantation; Poor IV access-keep for now.   [phone] [home]  # DISPOSITION: # port flush in 2 months/ and 4 months # follow up in 6 months- MD; labs- cbc/cmp;LDH; port flush; Dr.B    All questions were answered. The patient knows to call the clinic with any problems, questions or concerns.    Mary Coder, MD 01/02/2023 2:47 PM

## 2023-01-02 NOTE — Assessment & Plan Note (Addendum)
#  Diffuse large B-cell lymphoma-stage III.  PET June 2022-bulky right axillary/subpectoral; bulky retroperitoneal adenopathy; s/p 5 cycles of R-CHOP; discontinued cycle #6 [complicated sigmoid diverticulitis]-FEB 3rd, 2023- PET scan February 4,2022-no residual/recurrent lymphoma.   #No clinical evidence of recurrence. Continue clinical surveillance at this time.  We will plan imaging based on symptoms; stable.   #back pain/ Compression fractures -PET scan February 2023 incidental  multiple thoracolumbar compression fractures progressive T7-T8- ? Osteoporotic;  s/p-kyphoplasty of thoracic vertebra x2 [IR]-pain improved not resolved. BMD- April 2023- On Boniva [PCP;]S/p PT - stable.   #Complicated sigmoid diverticulitis [NOV 2022] s/p left hemicolectomy [Dr.Cintron]-  Patient awaiting s/p reversal of colostomy in MAY 22nd. 2023- stable.     # May 2022-Pancreatic head/uncinate cyst [incidental]- MRI NOV 2023- Stable to slight decreased size of the cystic pancreatic lesions both of which appear to demonstrate main ductal communication without suspicious MRI features. Compatible with side branch IPMNs. Recommend follow up pre and post contrast MRI/MRCP or pancreatic protocol CT in 1 year- Nov 2024.   # Hyponatremia sodium 131- chronic- 130s- stable-likely poor p.o. intake.; increased salt intake-  Stable.   #Peripheral neuropathy grade 1-secondary vincristine.  Monitor for now-stable.   # Port/IV access- Stable; discussed re: pro and cons of keeping the port vs. Explantation; Poor IV access-keep for now.   [phone] [home]  # DISPOSITION: # port flush in 2 months/ and 4 months # follow up in 6 months- MD; labs- cbc/cmp;LDH; port flush; MRI abdomen prior- Dr.B

## 2023-01-02 NOTE — Progress Notes (Signed)
No concerns today 

## 2023-01-14 ENCOUNTER — Ambulatory Visit
Admission: RE | Admit: 2023-01-14 | Discharge: 2023-01-14 | Disposition: A | Discharge status: Home / Self Care | Payer: Medicare Other | Source: Ambulatory Visit | Attending: Internal Medicine | Admitting: Internal Medicine

## 2023-01-14 DIAGNOSIS — Z1231 Encounter for screening mammogram for malignant neoplasm of breast: Secondary | ICD-10-CM | POA: Diagnosis not present

## 2023-03-04 ENCOUNTER — Inpatient Hospital Stay: Payer: Medicare Other | Attending: Internal Medicine

## 2023-03-04 DIAGNOSIS — Z452 Encounter for adjustment and management of vascular access device: Secondary | ICD-10-CM | POA: Insufficient documentation

## 2023-03-04 DIAGNOSIS — Z8572 Personal history of non-Hodgkin lymphomas: Secondary | ICD-10-CM | POA: Insufficient documentation

## 2023-03-04 DIAGNOSIS — Z95828 Presence of other vascular implants and grafts: Secondary | ICD-10-CM

## 2023-03-04 MED ORDER — HEPARIN SOD (PORK) LOCK FLUSH 100 UNIT/ML IV SOLN
500.0000 [IU] | Freq: Once | INTRAVENOUS | Status: AC
  Administered 2023-03-04: 500 [IU] via INTRAVENOUS
  Filled 2023-03-04: qty 5

## 2023-03-04 MED ORDER — SODIUM CHLORIDE 0.9% FLUSH
10.0000 mL | Freq: Once | INTRAVENOUS | Status: AC
  Administered 2023-03-04: 10 mL via INTRAVENOUS
  Filled 2023-03-04: qty 10

## 2023-03-12 ENCOUNTER — Ambulatory Visit: Payer: Medicare Other | Admitting: Dermatology

## 2023-04-15 IMAGING — PT NM PET TUM IMG INITIAL (PI) SKULL BASE T - THIGH
10 of 11 series · 22 of 25 positions shown · non-contrast
Comparison: CT 10/02/2020.

CLINICAL DATA: Initial treatment strategy for lymphadenopathy.

EXAM:
NUCLEAR MEDICINE PET SKULL BASE TO THIGH
TECHNIQUE: 6.9 mCi F-18 FDG was injected intravenously. Full-ring PET imaging
was performed from the skull base to thigh after the radiotracer. CT
data was obtained and used for attenuation correction and anatomic
localization.
Fasting blood glucose: 102 mg/dl

[Series 3: ct wb 5.0 b30f · axial · 5.0mm · 0.98mm/px · z∈[-210,+656]mm · 3 of 290 slices shown]
[im 1/290]
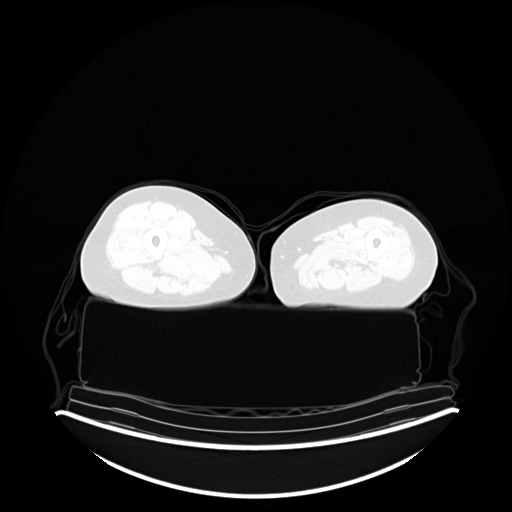
[im 145/290]
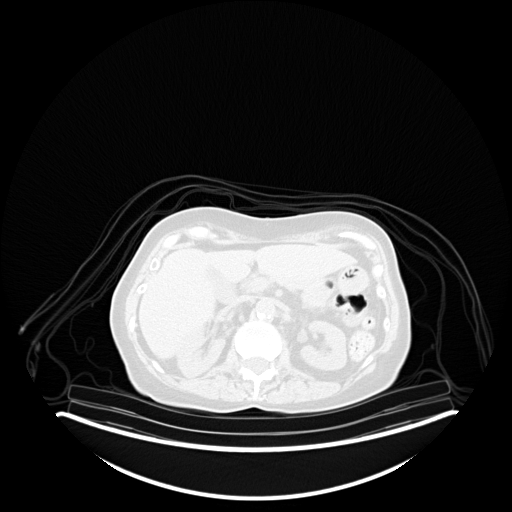
[im 290/290  brain]
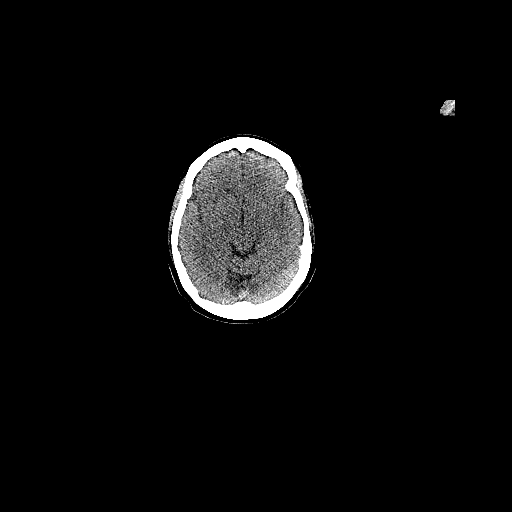

[Series 5: pet wb uncorrected (nac) · axial · 5.0mm · 4.07mm/px · z∈[-210,+656]mm · 2 of 290 slices shown]
[im 1/290]
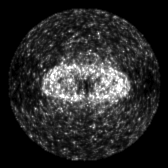
[im 290/290]
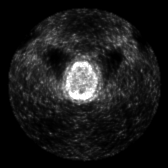

[Series 6: pet wb (ac) · axial · 5.0mm · 2.72mm/px · z∈[-210,+656]mm · 3 of 290 slices shown]
[im 1/290]
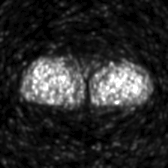
[im 145/290]
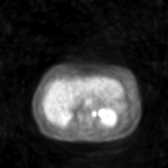
[im 290/290]
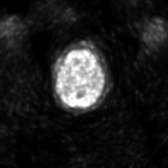

[Series 603: pet_ct axial fused · 3 of 287 slices shown]
[im 1/287]
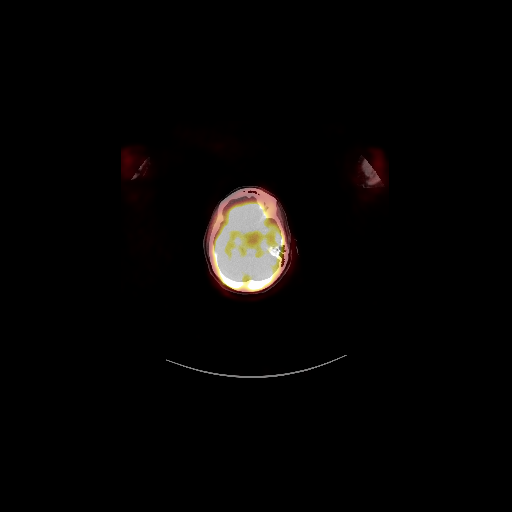
[im 144/287]
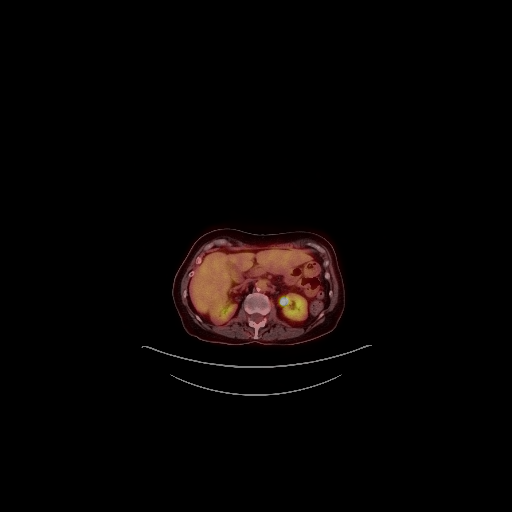
[im 287/287]
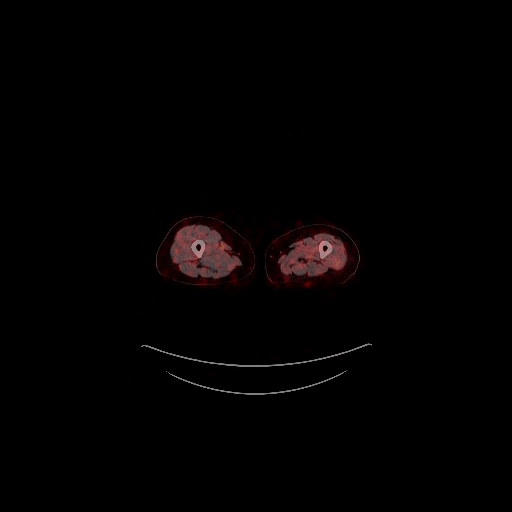

[Series 605: pet_ct sagittal fused · 2 of 149 slices shown]
[im 1/149]
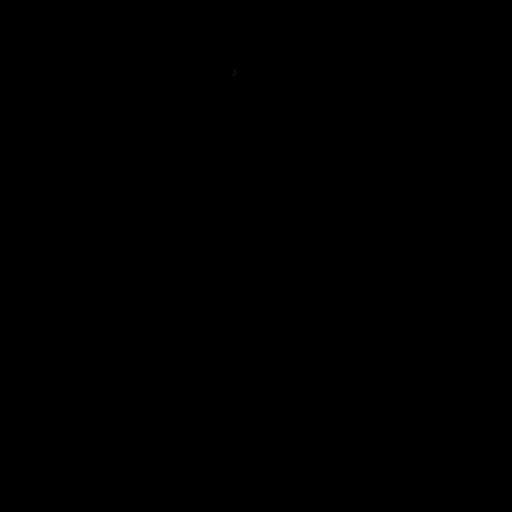
[im 149/149]
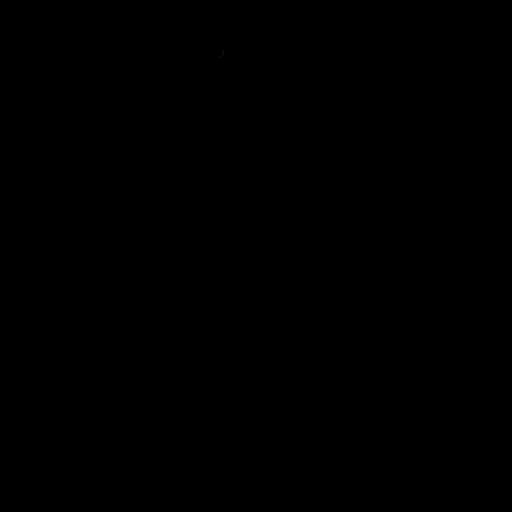

[Series 606: pet axial · 4 of 288 slices shown]
[im 1/288]
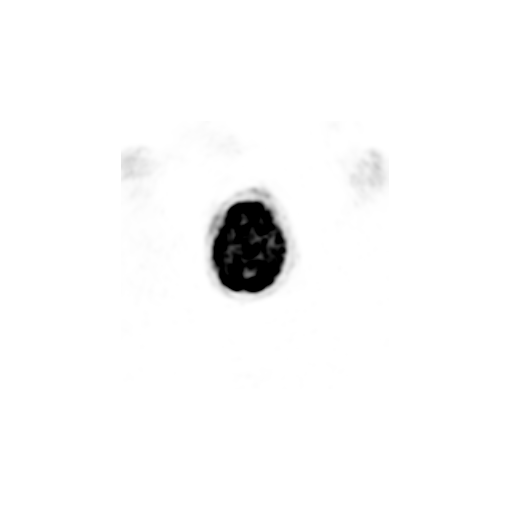
[im 96/288]
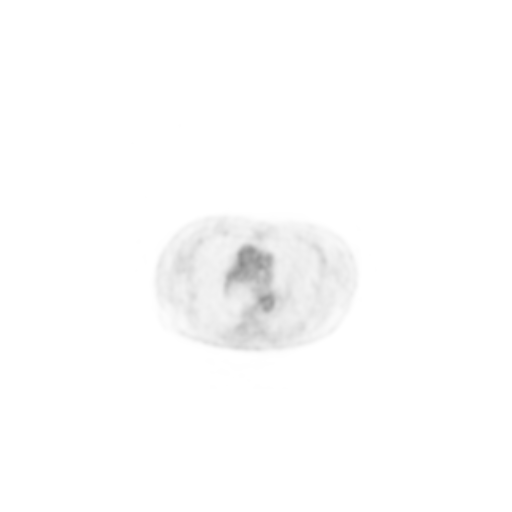
[im 192/288]
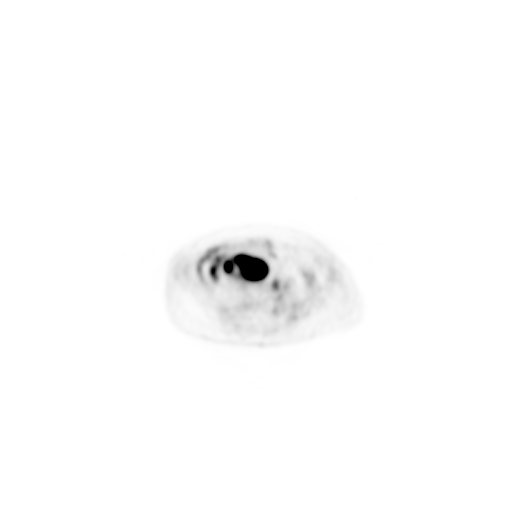
[im 288/288]
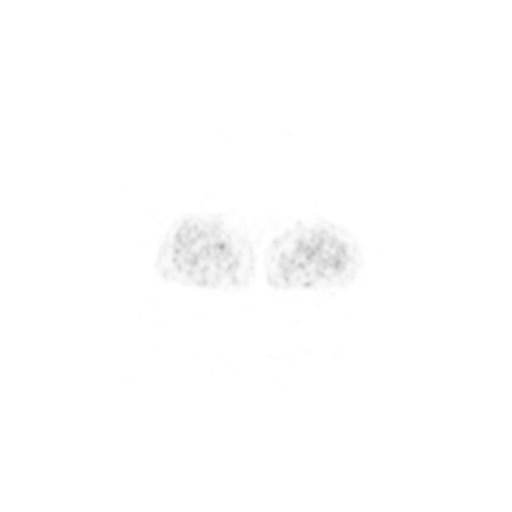

[Series 607: pet coronal · 1 of 122 slices shown]
[im 1/122]
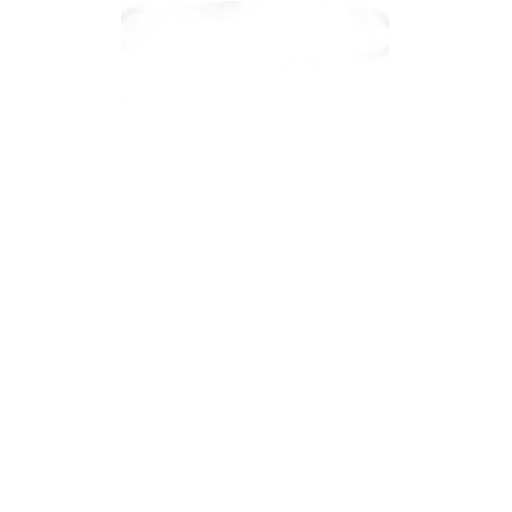

[Series 608: pet sagittal · 2 of 152 slices shown]
[im 1/152]
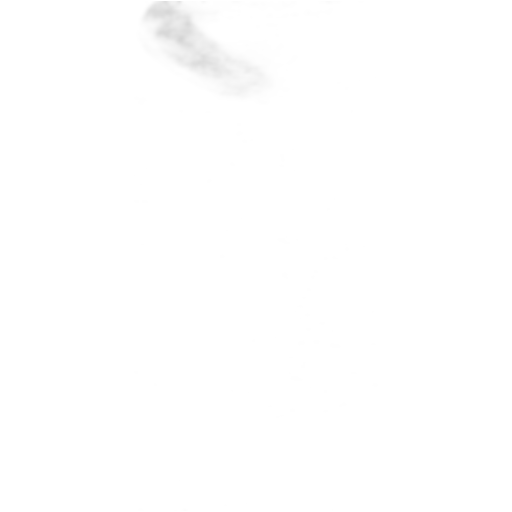
[im 152/152]
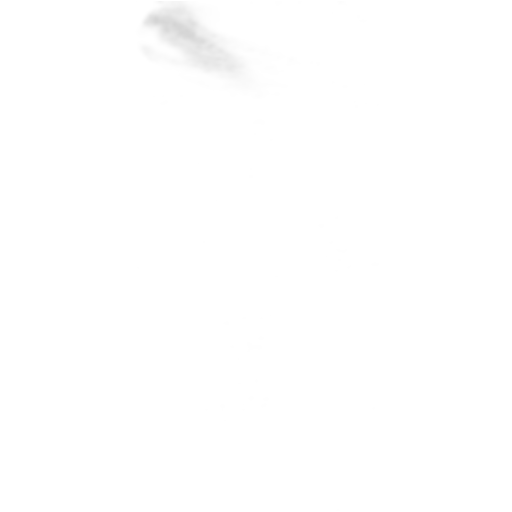

[Series 1114: results mm oncology reading · 1.0mm · 0.89mm/px · 1 of 7 slices shown (1 of 2)]
[im 1/7]
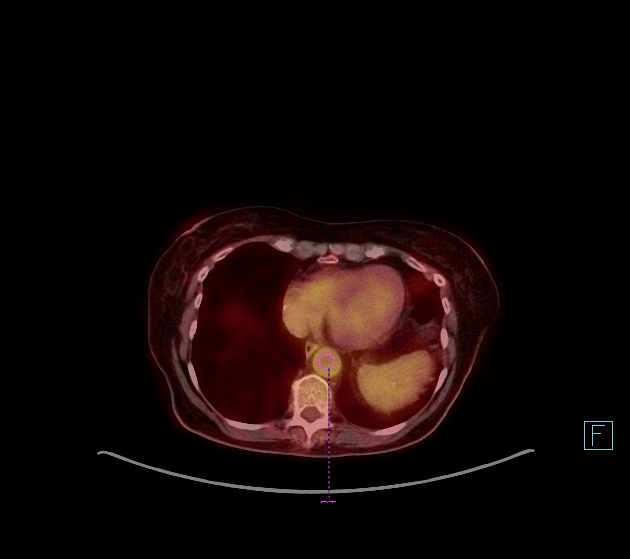

[Series 8089: results mm oncology reading · 5.0mm · 0.68mm/px · 1 of 5 slices shown (2 of 2)]
[im 1/5]
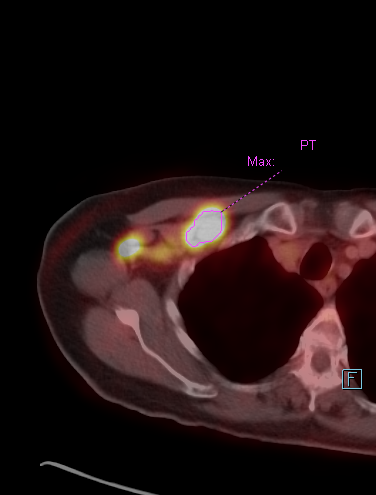

[22 of 25 positions shown; findings below may reference images not displayed]

FINDINGS: Mediastinal blood pool activity: SUV max

Liver activity: SUV max

NECK: No hypermetabolic lymph nodes in the neck.

Incidental CT findings: none

CHEST: Bulky RIGHT axillary adenopathy with intense metabolic
activity. Adenopathy extends the sub pectoralis nodal station on the
RIGHT. Example axillary node measures 14 mm short axis with SUV max
equal 16.9 (image 91)

No hypermetabolic LEFT axillary nodes. lymphadenectomy clips in the
LEFT axilla.

No hypermetabolic mediastinal lymph nodes.

Incidental CT findings: No suspicious nodularity.

ABDOMEN/PELVIS: Spleen is normal volume with normal metabolic
activity.

Hypermetabolic mass in the retroperitoneum adjacent to the LEFT
kidney and psoas measures 2.6 cm with SUV max equal 25.9.
Hypermetabolic periaortic nodes along the LEFT side aorta to the
bifurcation.

Within the central RIGHT lower abdominal mesentery 3.7 cm by 6.7 cm
mesenteric mass intensely metabolic with SUV max equal 25.9.

No hypermetabolic pelvic adenopathy.

Incidental CT findings: none

SKELETON: Single focus of intense metabolic activity associated with
the RIGHT femoral head measuring approximately 1 cm SUV max equal
4.7 on image 226 is favored unrelated to lymphoma but indeterminate.
No CT correlation.

Hypermetabolic nodule within the neural foramina of the L3-L4
vertebral body level on the LEFT measuring 1.1 cm with SUV max equal
15.8.

Incidental CT findings: none
IMPRESSION: 1. Bulky hypermetabolic RIGHT axillary adenopathy consistent with
lymphoma.
2. Bulky hypermetabolic periaortic retroperitoneal and large (7 cm)
mesenteric mass consistent with lymphoma.
3. Hypermetabolic nodule within the neural foramina at the L3-L4
vertebral level consistent with lymphoma.
4. Normal spleen and marrow activity.
5. Single focus of cortical activity in the RIGHT femoral head is
favored non malignant but indeterminate.

## 2023-05-04 ENCOUNTER — Inpatient Hospital Stay: Payer: Medicare Other | Attending: Internal Medicine

## 2023-05-04 DIAGNOSIS — Z452 Encounter for adjustment and management of vascular access device: Secondary | ICD-10-CM | POA: Diagnosis present

## 2023-05-04 DIAGNOSIS — Z8572 Personal history of non-Hodgkin lymphomas: Secondary | ICD-10-CM | POA: Insufficient documentation

## 2023-05-04 DIAGNOSIS — Z95828 Presence of other vascular implants and grafts: Secondary | ICD-10-CM

## 2023-05-04 MED ORDER — HEPARIN SOD (PORK) LOCK FLUSH 100 UNIT/ML IV SOLN
500.0000 [IU] | Freq: Once | INTRAVENOUS | Status: AC
  Administered 2023-05-04: 500 [IU] via INTRAVENOUS
  Filled 2023-05-04: qty 5

## 2023-05-04 MED ORDER — SODIUM CHLORIDE 0.9% FLUSH
10.0000 mL | Freq: Once | INTRAVENOUS | Status: AC
Start: 2023-05-04 — End: 2023-05-04
  Administered 2023-05-04: 10 mL via INTRAVENOUS
  Filled 2023-05-04: qty 10

## 2023-07-06 ENCOUNTER — Ambulatory Visit
Admission: RE | Admit: 2023-07-06 | Discharge: 2023-07-06 | Disposition: A | Discharge status: Home / Self Care | Payer: Medicare Other | Source: Ambulatory Visit | Attending: Internal Medicine | Admitting: Internal Medicine

## 2023-07-06 ENCOUNTER — Other Ambulatory Visit: Payer: Self-pay | Admitting: Internal Medicine

## 2023-07-06 DIAGNOSIS — K862 Cyst of pancreas: Secondary | ICD-10-CM

## 2023-07-06 MED ORDER — GADOBUTROL 1 MMOL/ML IV SOLN
5.0000 mL | Freq: Once | INTRAVENOUS | Status: AC | PRN
  Administered 2023-07-06: 5 mL via INTRAVENOUS

## 2023-07-13 ENCOUNTER — Encounter: Payer: Self-pay | Admitting: Internal Medicine

## 2023-07-13 ENCOUNTER — Inpatient Hospital Stay: Payer: Medicare Other | Admitting: Internal Medicine

## 2023-07-13 ENCOUNTER — Inpatient Hospital Stay: Payer: Medicare Other | Attending: Internal Medicine

## 2023-07-13 VITALS — BP 144/73 | HR 67 | Temp 97.6°F | Ht 59.0 in | Wt 126.4 lb

## 2023-07-13 DIAGNOSIS — G620 Drug-induced polyneuropathy: Secondary | ICD-10-CM | POA: Insufficient documentation

## 2023-07-13 DIAGNOSIS — Z95828 Presence of other vascular implants and grafts: Secondary | ICD-10-CM

## 2023-07-13 DIAGNOSIS — E871 Hypo-osmolality and hyponatremia: Secondary | ICD-10-CM | POA: Insufficient documentation

## 2023-07-13 DIAGNOSIS — Z9221 Personal history of antineoplastic chemotherapy: Secondary | ICD-10-CM | POA: Diagnosis not present

## 2023-07-13 DIAGNOSIS — C8581 Other specified types of non-Hodgkin lymphoma, lymph nodes of head, face, and neck: Secondary | ICD-10-CM

## 2023-07-13 DIAGNOSIS — Z85820 Personal history of malignant melanoma of skin: Secondary | ICD-10-CM | POA: Insufficient documentation

## 2023-07-13 DIAGNOSIS — Z8572 Personal history of non-Hodgkin lymphomas: Secondary | ICD-10-CM | POA: Diagnosis present

## 2023-07-13 LAB — CBC WITH DIFFERENTIAL (CANCER CENTER ONLY)
Abs Immature Granulocytes: 0.01 10*3/uL (ref 0.00–0.07)
Basophils Absolute: 0 10*3/uL (ref 0.0–0.1)
Basophils Relative: 0 %
Eosinophils Absolute: 0.2 10*3/uL (ref 0.0–0.5)
Eosinophils Relative: 3 %
HCT: 44.7 % (ref 36.0–46.0)
Hemoglobin: 15 g/dL (ref 12.0–15.0)
Immature Granulocytes: 0 %
Lymphocytes Relative: 32 %
Lymphs Abs: 2.1 10*3/uL (ref 0.7–4.0)
MCH: 28.8 pg (ref 26.0–34.0)
MCHC: 33.6 g/dL (ref 30.0–36.0)
MCV: 85.8 fL (ref 80.0–100.0)
Monocytes Absolute: 0.7 10*3/uL (ref 0.1–1.0)
Monocytes Relative: 11 %
Neutro Abs: 3.5 10*3/uL (ref 1.7–7.7)
Neutrophils Relative %: 54 %
Platelet Count: 235 10*3/uL (ref 150–400)
RBC: 5.21 MIL/uL — ABNORMAL HIGH (ref 3.87–5.11)
RDW: 13.3 % (ref 11.5–15.5)
WBC Count: 6.4 10*3/uL (ref 4.0–10.5)
nRBC: 0 % (ref 0.0–0.2)

## 2023-07-13 LAB — CMP (CANCER CENTER ONLY)
ALT: 14 U/L (ref 0–44)
AST: 21 U/L (ref 15–41)
Albumin: 3.8 g/dL (ref 3.5–5.0)
Alkaline Phosphatase: 67 U/L (ref 38–126)
Anion gap: 9 (ref 5–15)
BUN: 18 mg/dL (ref 8–23)
CO2: 25 mmol/L (ref 22–32)
Calcium: 8.7 mg/dL — ABNORMAL LOW (ref 8.9–10.3)
Chloride: 97 mmol/L — ABNORMAL LOW (ref 98–111)
Creatinine: 0.65 mg/dL (ref 0.44–1.00)
GFR, Estimated: >60 mL/min (ref >60)
Glucose, Bld: 111 mg/dL — ABNORMAL HIGH (ref 70–99)
Potassium: 4 mmol/L (ref 3.5–5.1)
Sodium: 131 mmol/L — ABNORMAL LOW (ref 135–145)
Total Bilirubin: 0.6 mg/dL (ref 0.0–1.2)
Total Protein: 6.1 g/dL — ABNORMAL LOW (ref 6.5–8.1)

## 2023-07-13 LAB — LACTATE DEHYDROGENASE: LDH: 122 U/L (ref 98–192)

## 2023-07-13 MED ORDER — HEPARIN SOD (PORK) LOCK FLUSH 100 UNIT/ML IV SOLN
500.0000 [IU] | Freq: Once | INTRAVENOUS | Status: AC
  Administered 2023-07-13: 500 [IU] via INTRAVENOUS
  Filled 2023-07-13: qty 5

## 2023-07-13 MED ORDER — SODIUM CHLORIDE 0.9% FLUSH
10.0000 mL | Freq: Once | INTRAVENOUS | Status: AC
  Administered 2023-07-13: 10 mL via INTRAVENOUS
  Filled 2023-07-13: qty 10

## 2023-07-13 NOTE — Progress Notes (Signed)
MRI ABD w and w/o 07/06/23.  C/o lt knee pain 2/10, no injury, states she has been standing today.  C/o having intestinal gas.   She would like to know when she may get the port removed?

## 2023-07-13 NOTE — Assessment & Plan Note (Addendum)
#  Diffuse large B-cell lymphoma-stage III.  PET June 2022-bulky right axillary/subpectoral; bulky retroperitoneal adenopathy; s/p 5 cycles of R-CHOP; discontinued cycle #6 [complicated sigmoid diverticulitis]-FEB 3rd, 2023- PET scan February 4,2022-no residual/recurrent lymphoma.   # No clinical evidence of recurrence. Continue clinical surveillance at this time.  We will plan imaging based on symptoms; stable.   #back pain/ Compression fractures -PET scan February 2023 incidental  multiple thoracolumbar compression fractures progressive T7-T8- ? Osteoporotic;  s/p-kyphoplasty of thoracic vertebra x2 [IR]-pain improved not resolved. BMD- April 2023- On Boniva [PCP;]S/p PT - stable.   #Complicated sigmoid diverticulitis [NOV 2022] s/p left hemicolectomy [Dr.Cintron]- s/p reversal of colostomy in MAY 22nd. 2023- stable.    # May 2022-Pancreatic head/uncinate cyst [incidental]- MRI NOV 2023- Stable to slight decreased size of the cystic pancreatic lesions both of which appear to demonstrate main ductal communication without suspicious MRI features. Compatible with side branch IPMNs. FEB 2025-  pre and post contrast MRI/MRCP-stable cyst noted in the pancreas.  Recommended repeat scan again in 2 years February 2027.   # Hyponatremia sodium 131- chronic- 130s- stable-likely poor p.o. intake.; increased salt intake-stable.   #Peripheral neuropathy grade 1-secondary vincristine.  Monitor for now-stable.   # Port/IV access- Stable; referral to IR re: PORT explantation   * [phone] [home]  # DISPOSITION: # referral to IR re: PORT explantation  # follow up in 6 months- MD; labs- cbc/cmp;LDH-- Dr.B

## 2023-07-13 NOTE — Progress Notes (Signed)
[facility] CONSULT NOTE  Patient Care Team: Lauro Regulus, MD as PCP - General (Internal Medicine) Earna Coder, MD as Consulting Physician (Oncology)  CHIEF COMPLAINTS/PURPOSE OF CONSULTATION: Lymphoma Oncology History Overview Note   # Incidental- MAY 2022-CT scan chest abdomen pelvis consistent with bulky retroperitoneal adenopathy [6-8 cm]; subpectoral/axillary lymphadenopathy 2 to 2.5 cm; PET June 2022-bulky right axillary/subpectoral; bulky retroperitoneal adenopathy.  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  Repeat MRI 3-6 m   # coloscopy [2022; [facility]]; mammogram- nov 2021-WNL.   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 .  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  Walking independently.   Mary Washington 78 y.o.   female n diffuse large B-cell lymphoma stage III STATUS POST 5 CYCLES OF R-CHOP chemotherapy--currently on surveillance is here for follow-up-and review results of the MRI abdomen for pancreatic cyst.  Patient complains of ongoing joint pain back pain not any worse.  Especially with movement. She is walking with a walker  Complains of abdominal bloating...   No worsening shortness of breath or cough. Appetite is good.  .  Denies any new lumps or bumps.  Review of Systems  Constitutional:  Positive for malaise/fatigue. Negative for chills, diaphoresis and fever.  HENT:  Negative for nosebleeds and sore throat.   Eyes:  Negative for double vision.  Respiratory:  Negative for cough, hemoptysis, sputum production, shortness of breath and wheezing.   Cardiovascular:  Negative for chest pain, palpitations, orthopnea and leg swelling.  Gastrointestinal:  Negative for abdominal pain, blood in stool, diarrhea, heartburn, melena, nausea and vomiting.  Genitourinary:  Negative for dysuria, frequency and urgency.  Musculoskeletal:  Positive for joint pain.  Skin: Negative.  Negative for itching and rash.  Neurological:  Negative for dizziness, tingling, focal weakness, weakness and headaches.  Endo/Heme/Allergies:  Does not bruise/bleed easily.  Psychiatric/Behavioral:  Negative for depression. The patient is not nervous/anxious and does not have insomnia.      MEDICAL HISTORY:  Past Medical History:  Diagnosis Date   Anemia    Arthritis    Colon polyps    Colostomy in place [facility])    Complication of anesthesia    bp dropped with novacaine at dentist office and pt had to be admitted to hospital for 24 hours   Diverticulosis    Family history of adverse reaction to anesthesia  brother-pt unsure of what reaction her brother had   GERD (gastroesophageal reflux disease)    Hyponatremia    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 (HCC)    Melanoma (HCC) 1988   lt upper arm, some lymph nodes  removed   Mild intermittent asthma    well controlled   Osteoporosis    Pneumonia 2022   Tachycardia     SURGICAL HISTORY: Past Surgical History:  Procedure Laterality Date   BACK SURGERY     CATARACT EXTRACTION     COLONOSCOPY WITH PROPOFOL N/A 09/12/2021   Procedure: COLONOSCOPY WITH PROPOFOL;  Surgeon: Sung Amabile, DO;  Location: [facility] ENDOSCOPY;  Service: General;  Laterality: N/A;  Dr Tonna Boehringer can be 12:30 PM  procedure start time   IR IMAGING GUIDED PORT INSERTION  11/23/2020   IR KYPHO THORACIC WITH BONE BIOPSY  08/12/2021   IR RADIOLOGIST EVAL & MGMT  07/23/2021   IR RADIOLOGIST EVAL & MGMT  09/11/2021   LAPAROSCOPIC LYSIS OF ADHESIONS  10/21/2021   Procedure: LAPAROSCOPIC LYSIS OF ADHESIONS;  Surgeon: Carolan Shiver, MD;  Location: [facility] ORS;  Service: General;;   PARTIAL COLECTOMY N/A 04/24/2021   Procedure: PARTIAL COLECTOMY;  Surgeon: Carolan Shiver, MD;  Location: [facility] ORS;  Service: General;  Laterality: N/A;   TONSILLECTOMY     VAGINAL HYSTERECTOMY  1990   XI ROBOTIC ASSISTED COLOSTOMY TAKEDOWN N/A 10/21/2021   Procedure: XI ROBOTIC ASSISTED COLOSTOMY TAKEDOWN;  Surgeon: Carolan Shiver, MD;  Location: [facility] ORS;  Service: General;  Laterality: N/A;    SOCIAL HISTORY: Social History   Socioeconomic History   Marital status: Married    Spouse name: Nadine Counts   Number of children: 2   Years of education: Not on file   Highest education level: Not on file  Occupational History   Not on file  Tobacco Use   Smoking status: Never   Smokeless tobacco: Never  Vaping Use   Vaping status: Never Used  Substance and Sexual Activity   Alcohol use: Yes    Comment: rare   Drug use: Never   Sexual activity: Yes    Partners: Male  Other Topics Concern   Not on file  Social History Narrative   ** Merged History Encounter **       Lives at home with husband.    Social Drivers of Corporate investment banker Strain: Low Risk  (04/21/2023)   Received from [facility]   Overall Financial Resource Strain (CARDIA)    Difficulty of Paying Living Expenses: Not hard at all  Food Insecurity: No Food Insecurity (04/21/2023)   Received from [facility]   Hunger Vital Sign    Worried About Running Out of Food in the Last Year: Never true    Ran Out of Food in the Last Year: Never true  Transportation Needs: No Transportation Needs (04/21/2023)   [facility] from [facility] - Transportation    In the past 12 months, has lack of transportation kept you from medical appointments or from getting medications?: No    Lack of Transportation (Non-Medical): No  Physical Activity: Not on file  Stress: Not on file  Social Connections: Not on file  Intimate Partner Violence: Not on file    FAMILY HISTORY: Family History  Problem Relation Age of Onset   Breast cancer Neg Hx     ALLERGIES:  has no known allergies.  MEDICATIONS:  Current Outpatient Medications  Medication Sig Dispense Refill   acetaminophen (TYLENOL) 500 MG tablet Take 500 mg by mouth every 6 (six) hours as needed for moderate pain or mild pain.     Calcium Carb-Cholecalciferol 600-20 MG-MCG TABS Take 1 tablet by mouth daily. citracal     Cholecalciferol (VITAMIN D) 50 MCG (2000 UT) CAPS Take 2,000 Units by mouth daily.     diclofenac Sodium (VOLTAREN) 1 % GEL Apply 2 g topically 2 (two) times daily as needed (left leg pain).     ibandronate (BONIVA) 150 MG tablet Take 150 mg by mouth every 30 (thirty) days. Take in the morning with a full glass of water, on an empty stomach, and do not take anything else by mouth or lie down for the next 30 min.     lidocaine-prilocaine (EMLA) cream 1 application. as needed (on her port).     Magnesium Oxide (MAG-OXIDE PO) Take 250 mg by mouth daily at 12 noon.     Multiple Vitamins-Minerals (WOMENS MULTIVITAMIN PO) Take 1 Tera Vector Genomes by mouth daily.     polyethylene glycol (MIRALAX /  GLYCOLAX) 17 g packet Take 17 g by mouth daily.     metoprolol tartrate (LOPRESSOR) 25 MG tablet Take 1 tablet (25 mg total) by mouth 2 (two) times daily. 60 tablet 0   omeprazole (PRILOSEC) 20 MG capsule Take 20 mg by mouth every morning. (Patient not taking: Reported on 07/13/2023)     No current facility-administered medications for this visit.   Facility-Administered Medications Ordered in Other Visits  Medication Dose Route Frequency Provider Last Rate Last Admin   sodium chloride flush (NS) 0.9 % injection 10 mL  10 mL Intravenous PRN Earna Coder, MD   10 mL at 01/07/21 0919      .  PHYSICAL EXAMINATION: ECOG PERFORMANCE STATUS: 1 - Symptomatic but completely ambulatory  Vitals:   07/13/23 1047  BP: (!) 144/73  Pulse: 67  Temp: 97.6 F (36.4 C)  SpO2: 100%     Filed Weights   07/13/23 1047  Weight: 126 lb 6.4 oz (57.3 kg)       Physical Exam HENT:     Head: Normocephalic and atraumatic.     Mouth/Throat:     Pharynx: No oropharyngeal exudate.  Eyes:     Pupils: Pupils are equal, round, and reactive to light.  Cardiovascular:     Rate and Rhythm: Normal rate and regular rhythm.  Pulmonary:     Effort: Pulmonary effort is normal. No respiratory distress.     Breath sounds: Normal breath sounds. No wheezing.  Abdominal:     General: Bowel sounds are normal. There is no distension.     Palpations: Abdomen is soft. There is no mass.     Tenderness: There is no abdominal tenderness. There is no guarding or rebound.  Musculoskeletal:        General: Normal range of motion.     Cervical back: Normal range of motion and neck supple.  Skin:    General: Skin is warm.  Neurological:     Mental Status: She is alert and oriented to person, place, and time.  Psychiatric:        Mood and Affect: Affect normal.      LABORATORY DATA:  I have reviewed the data as listed Lab Results  Component Value Date   WBC 6.4 07/13/2023   HGB 15.0 07/13/2023   HCT  44.7 07/13/2023   MCV 85.8 07/13/2023   PLT 235  07/13/2023   Recent Labs    01/02/23 1413 07/13/23 1027  NA 131* 131*  K 4.0 4.0  CL 98 97*  CO2 23 25  GLUCOSE 100* 111*  BUN 17 18  CREATININE 0.63 0.65  CALCIUM 8.9 8.7*  GFRNONAA >60 >60  PROT 6.4* 6.1*  ALBUMIN 3.9 3.8  AST 22 21  ALT 15 14  ALKPHOS 75 67  BILITOT 0.7 0.6    RADIOGRAPHIC STUDIES: I have personally reviewed the radiological images as listed and agreed with the findings in the report.   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  PET June 2022-bulky right axillary/subpectoral; bulky retroperitoneal adenopathy; s/p 5 cycles of R-CHOP; discontinued cycle #6 [complicated sigmoid diverticulitis]-FEB 3rd, 2023- PET scan February 4,2022-no residual/recurrent lymphoma.   # No clinical evidence of recurrence. Continue clinical surveillance at this time.  We will plan imaging based on symptoms; stable.   #back pain/ Compression fractures -PET scan February 2023 incidental  multiple thoracolumbar compression fractures progressive T7-T8- ? Osteoporotic;  s/p-kyphoplasty of thoracic vertebra x2 [IR]-pain improved not resolved. BMD- April 2023- On Boniva [PCP;]S/p PT - stable.   #Complicated sigmoid diverticulitis [NOV 2022] s/p left hemicolectomy [Dr.Cintron]- s/p reversal of colostomy in MAY 22nd. 2023- stable.    # May 2022-Pancreatic head/uncinate cyst [incidental]- MRI NOV 2023- Stable to slight decreased size of the cystic pancreatic lesions both of which appear to demonstrate main ductal communication without suspicious MRI features. Compatible with side branch IPMNs. FEB 2025-  pre and post contrast MRI/MRCP-stable cyst noted in the pancreas.  Recommended repeat scan again in 2 years February 2027.   # Hyponatremia sodium 131- chronic- 130s- stable-likely poor p.o. intake.; increased salt intake-stable.   #Peripheral neuropathy grade  1-secondary vincristine.  Monitor for now-stable.   # Port/IV access- Stable; referral to IR re: PORT explantation   * [phone] [home]  # DISPOSITION: # referral to IR re: PORT explantation  # follow up in 6 months- MD; labs- cbc/cmp;LDH-- Dr.B     All questions were answered. The patient knows to call the clinic with any problems, questions or concerns.    Earna Coder, MD 07/20/2023 2:37 PM

## 2023-07-16 ENCOUNTER — Encounter: Payer: Self-pay | Admitting: Internal Medicine

## 2023-07-19 ENCOUNTER — Encounter: Payer: Self-pay | Admitting: Internal Medicine

## 2023-07-21 ENCOUNTER — Telehealth: Payer: Self-pay | Admitting: *Deleted

## 2023-07-21 NOTE — Telephone Encounter (Signed)
I called the pt. And let her know that the order has been put in. The person that makes the appts for the whole [facility] is taking off for 5 days and she will start back with after staff person comes back I hoping in 1-2 weeks. She understands

## 2023-07-24 ENCOUNTER — Telehealth: Payer: Self-pay

## 2023-07-24 NOTE — Telephone Encounter (Signed)
Per secure chat [place] Fore: Mary Washington, I was making sure that I had about everybody else on because this will be done with local anes and she can drive herself. [facility] will [facility] but it won't take but about 30 min to take out. She has only had it a couple of years and Dr Grace Isaac put it in. She can come Wed 2/26 or Fri 2/28 at 1:30p for a 2p procedure.   I tried to call pt, and no answer or vm, we will try to call her back next week.

## 2023-07-27 NOTE — Telephone Encounter (Signed)
 Pt notified, she can do 07/31/23, Mary Washington notified.

## 2023-07-29 ENCOUNTER — Telehealth: Payer: Self-pay | Admitting: *Deleted

## 2023-07-29 ENCOUNTER — Encounter: Payer: Self-pay | Admitting: Internal Medicine

## 2023-07-29 NOTE — Telephone Encounter (Signed)
 Patient was told that she needs to call cancer center about the rules of pt. Getting port out/ I called her and she gets local anesthesia. Sje can eat and drink and she can drive if she wants also. Marylu Lund will call her tom. To  let her about the things we spoke of tom. Pt. Ok with this

## 2023-07-30 ENCOUNTER — Other Ambulatory Visit: Payer: Self-pay | Admitting: Radiology

## 2023-07-30 NOTE — Progress Notes (Signed)
 Patient for IR Port Removal on Friday 07/31/23, I called and LVM for the patient on the phone and gave pre-procedure instructions. VM made pt aware to be here at 1:30p and check in at the new entrance.  Called 07/30/23

## 2023-07-31 ENCOUNTER — Ambulatory Visit
Admission: RE | Admit: 2023-07-31 | Discharge: 2023-07-31 | Disposition: A | Discharge status: Home / Self Care | Payer: Medicare Other | Source: Ambulatory Visit | Attending: Internal Medicine | Admitting: Internal Medicine

## 2023-07-31 DIAGNOSIS — Z452 Encounter for adjustment and management of vascular access device: Secondary | ICD-10-CM | POA: Diagnosis present

## 2023-07-31 DIAGNOSIS — Z95828 Presence of other vascular implants and grafts: Secondary | ICD-10-CM

## 2023-07-31 HISTORY — PX: IR REMOVAL TUN ACCESS W/ PORT W/O FL MOD SED: IMG2290

## 2023-07-31 MED ORDER — LIDOCAINE-EPINEPHRINE 1 %-1:100000 IJ SOLN
6.0000 mL | Freq: Once | INTRAMUSCULAR | Status: AC
Start: 2023-07-31 — End: 2023-07-31
  Administered 2023-07-31: 6 mL via INTRADERMAL

## 2023-07-31 MED ORDER — LIDOCAINE-EPINEPHRINE 1 %-1:100000 IJ SOLN
INTRAMUSCULAR | Status: AC
  Filled 2023-07-31: qty 1

## 2023-07-31 NOTE — Procedures (Signed)
Interventional Radiology Procedure Note  Procedure: Removal of right chest portacatheter.  Complications: None  Estimated Blood Loss: None  Recommendations: DC home  Signed,  Shell Yandow K. Caraline Deutschman, MD    

## 2023-11-27 ENCOUNTER — Inpatient Hospital Stay
Admission: EM | Admit: 2023-11-27 | Discharge: 2023-12-02 | DRG: 522 | Disposition: A | Discharge status: Skilled Nursing Facility | Attending: Student | Admitting: Student

## 2023-11-27 ENCOUNTER — Emergency Department

## 2023-11-27 ENCOUNTER — Inpatient Hospital Stay

## 2023-11-27 DIAGNOSIS — S72002A Fracture of unspecified part of neck of left femur, initial encounter for closed fracture: Principal | ICD-10-CM | POA: Diagnosis present

## 2023-11-27 DIAGNOSIS — E8809 Other disorders of plasma-protein metabolism, not elsewhere classified: Secondary | ICD-10-CM | POA: Diagnosis present

## 2023-11-27 DIAGNOSIS — Y92512 Supermarket, store or market as the place of occurrence of the external cause: Secondary | ICD-10-CM

## 2023-11-27 DIAGNOSIS — C439 Malignant melanoma of skin, unspecified: Secondary | ICD-10-CM | POA: Diagnosis present

## 2023-11-27 DIAGNOSIS — K219 Gastro-esophageal reflux disease without esophagitis: Secondary | ICD-10-CM | POA: Diagnosis present

## 2023-11-27 DIAGNOSIS — D509 Iron deficiency anemia, unspecified: Secondary | ICD-10-CM | POA: Diagnosis present

## 2023-11-27 DIAGNOSIS — C8331 Diffuse large B-cell lymphoma, lymph nodes of head, face, and neck: Secondary | ICD-10-CM | POA: Diagnosis present

## 2023-11-27 DIAGNOSIS — M25512 Pain in left shoulder: Secondary | ICD-10-CM

## 2023-11-27 DIAGNOSIS — E876 Hypokalemia: Secondary | ICD-10-CM | POA: Diagnosis present

## 2023-11-27 DIAGNOSIS — W010XXA Fall on same level from slipping, tripping and stumbling without subsequent striking against object, initial encounter: Secondary | ICD-10-CM | POA: Diagnosis present

## 2023-11-27 DIAGNOSIS — I10 Essential (primary) hypertension: Secondary | ICD-10-CM | POA: Diagnosis present

## 2023-11-27 DIAGNOSIS — Z79899 Other long term (current) drug therapy: Secondary | ICD-10-CM | POA: Diagnosis not present

## 2023-11-27 DIAGNOSIS — J4520 Mild intermittent asthma, uncomplicated: Secondary | ICD-10-CM | POA: Diagnosis present

## 2023-11-27 DIAGNOSIS — Z833 Family history of diabetes mellitus: Secondary | ICD-10-CM

## 2023-11-27 DIAGNOSIS — M810 Age-related osteoporosis without current pathological fracture: Secondary | ICD-10-CM | POA: Diagnosis present

## 2023-11-27 DIAGNOSIS — K5792 Diverticulitis of intestine, part unspecified, without perforation or abscess without bleeding: Secondary | ICD-10-CM | POA: Diagnosis present

## 2023-11-27 DIAGNOSIS — K5900 Constipation, unspecified: Secondary | ICD-10-CM

## 2023-11-27 DIAGNOSIS — K5909 Other constipation: Secondary | ICD-10-CM | POA: Diagnosis present

## 2023-11-27 DIAGNOSIS — Z85820 Personal history of malignant melanoma of skin: Secondary | ICD-10-CM

## 2023-11-27 DIAGNOSIS — S7292XA Unspecified fracture of left femur, initial encounter for closed fracture: Secondary | ICD-10-CM | POA: Diagnosis present

## 2023-11-27 DIAGNOSIS — R591 Generalized enlarged lymph nodes: Secondary | ICD-10-CM | POA: Diagnosis present

## 2023-11-27 DIAGNOSIS — E222 Syndrome of inappropriate secretion of antidiuretic hormone: Secondary | ICD-10-CM | POA: Diagnosis present

## 2023-11-27 DIAGNOSIS — C8581 Other specified types of non-Hodgkin lymphoma, lymph nodes of head, face, and neck: Secondary | ICD-10-CM | POA: Diagnosis present

## 2023-11-27 DIAGNOSIS — W19XXXA Unspecified fall, initial encounter: Principal | ICD-10-CM

## 2023-11-27 LAB — CBC WITH DIFFERENTIAL/PLATELET
Abs Immature Granulocytes: 0.05 10*3/uL (ref 0.00–0.07)
Basophils Absolute: 0 10*3/uL (ref 0.0–0.1)
Basophils Relative: 0 %
Eosinophils Absolute: 0.2 10*3/uL (ref 0.0–0.5)
Eosinophils Relative: 3 %
HCT: 38.8 % (ref 36.0–46.0)
Hemoglobin: 12.9 g/dL (ref 12.0–15.0)
Immature Granulocytes: 1 %
Lymphocytes Relative: 42 %
Lymphs Abs: 3.1 10*3/uL (ref 0.7–4.0)
MCH: 29.1 pg (ref 26.0–34.0)
MCHC: 33.2 g/dL (ref 30.0–36.0)
MCV: 87.4 fL (ref 80.0–100.0)
Monocytes Absolute: 0.6 10*3/uL (ref 0.1–1.0)
Monocytes Relative: 8 %
Neutro Abs: 3.5 10*3/uL (ref 1.7–7.7)
Neutrophils Relative %: 46 %
Platelets: 253 10*3/uL (ref 150–400)
RBC: 4.44 MIL/uL (ref 3.87–5.11)
RDW: 13.6 % (ref 11.5–15.5)
WBC: 7.5 10*3/uL (ref 4.0–10.5)
nRBC: 0 % (ref 0.0–0.2)

## 2023-11-27 LAB — COMPREHENSIVE METABOLIC PANEL WITH GFR
ALT: 15 U/L (ref 0–44)
AST: 21 U/L (ref 15–41)
Albumin: 2.5 g/dL — ABNORMAL LOW (ref 3.5–5.0)
Alkaline Phosphatase: 38 U/L (ref 38–126)
Anion gap: 5 (ref 5–15)
BUN: 16 mg/dL (ref 8–23)
CO2: 21 mmol/L — ABNORMAL LOW (ref 22–32)
Calcium: 6.3 mg/dL — CL (ref 8.9–10.3)
Chloride: 112 mmol/L — ABNORMAL HIGH (ref 98–111)
Creatinine, Ser: 0.62 mg/dL (ref 0.44–1.00)
GFR, Estimated: >60 mL/min (ref >60)
Glucose, Bld: 88 mg/dL (ref 70–99)
Potassium: 2.6 mmol/L — CL (ref 3.5–5.1)
Sodium: 138 mmol/L (ref 135–145)
Total Bilirubin: 0.5 mg/dL (ref 0.0–1.2)
Total Protein: 3.9 g/dL — ABNORMAL LOW (ref 6.5–8.1)

## 2023-11-27 LAB — POTASSIUM: Potassium: 4.1 mmol/L (ref 3.5–5.1)

## 2023-11-27 LAB — MAGNESIUM: Magnesium: 1.7 mg/dL (ref 1.7–2.4)

## 2023-11-27 LAB — PHOSPHORUS: Phosphorus: 3.5 mg/dL (ref 2.5–4.6)

## 2023-11-27 MED ORDER — SENNOSIDES-DOCUSATE SODIUM 8.6-50 MG PO TABS
1.0000 | ORAL_TABLET | Freq: Two times a day (BID) | ORAL | Status: DC
  Administered 2023-11-27: 1 via ORAL
  Filled 2023-11-27: qty 1

## 2023-11-27 MED ORDER — CHLORHEXIDINE GLUCONATE CLOTH 2 % EX PADS: 6.0000 | MEDICATED_PAD | Freq: Every day | CUTANEOUS | Status: DC

## 2023-11-27 MED ORDER — LACTATED RINGERS IV SOLN: INTRAVENOUS | Status: DC

## 2023-11-27 MED ORDER — CALCIUM GLUCONATE-NACL 1-0.675 GM/50ML-% IV SOLN
1.0000 g | Freq: Once | INTRAVENOUS | Status: AC
  Administered 2023-11-27: 1000 mg via INTRAVENOUS
  Filled 2023-11-27: qty 50

## 2023-11-27 MED ORDER — CEFAZOLIN SODIUM-DEXTROSE 2-4 GM/100ML-% IV SOLN
2.0000 g | INTRAVENOUS | Status: AC
  Administered 2023-11-28: 2 g via INTRAVENOUS

## 2023-11-27 MED ORDER — VITAMIN D 25 MCG (1000 UNIT) PO TABS
2000.0000 [IU] | ORAL_TABLET | Freq: Every day | ORAL | Status: DC
  Administered 2023-11-29 – 2023-12-02 (×4): 2000 [IU] via ORAL
  Filled 2023-11-27 (×5): qty 2

## 2023-11-27 MED ORDER — POTASSIUM CHLORIDE 10 MEQ/100ML IV SOLN
10.0000 meq | INTRAVENOUS | Status: AC
  Administered 2023-11-27 (×2): 10 meq via INTRAVENOUS
  Filled 2023-11-27 (×2): qty 100

## 2023-11-27 MED ORDER — OYSTER SHELL CALCIUM/D3 500-5 MG-MCG PO TABS: 1.0000 | ORAL_TABLET | Freq: Every day | ORAL | Status: DC

## 2023-11-27 MED ORDER — POTASSIUM CHLORIDE 10 MEQ/100ML IV SOLN
10.0000 meq | INTRAVENOUS | Status: DC
  Administered 2023-11-27 (×2): 10 meq via INTRAVENOUS
  Filled 2023-11-27 (×2): qty 100

## 2023-11-27 MED ORDER — METOPROLOL TARTRATE 25 MG PO TABS
25.0000 mg | ORAL_TABLET | Freq: Two times a day (BID) | ORAL | Status: DC
  Administered 2023-11-29 – 2023-12-01 (×4): 25 mg via ORAL
  Filled 2023-11-27 (×5): qty 1

## 2023-11-27 MED ORDER — LACTATED RINGERS IV BOLUS
1000.0000 mL | Freq: Once | INTRAVENOUS | Status: AC
  Administered 2023-11-27: 1000 mL via INTRAVENOUS

## 2023-11-27 MED ORDER — MORPHINE SULFATE (PF) 2 MG/ML IV SOLN: 2.0000 mg | INTRAVENOUS | Status: AC | PRN

## 2023-11-27 MED ORDER — POLYETHYLENE GLYCOL 3350 17 G PO PACK: 17.0000 g | PACK | Freq: Every day | ORAL | Status: DC

## 2023-11-27 MED ORDER — OYSTER SHELL CALCIUM/D3 500-5 MG-MCG PO TABS
1.0000 | ORAL_TABLET | Freq: Every day | ORAL | Status: DC
  Administered 2023-11-28 – 2023-12-01 (×4): 1 via ORAL
  Filled 2023-11-27 (×4): qty 1

## 2023-11-27 MED ORDER — HYDROCODONE-ACETAMINOPHEN 5-325 MG PO TABS
1.0000 | ORAL_TABLET | Freq: Four times a day (QID) | ORAL | Status: AC | PRN
  Administered 2023-11-27 – 2023-11-28 (×2): 1 via ORAL
  Filled 2023-11-27 (×2): qty 1

## 2023-11-27 MED ORDER — ONDANSETRON HCL 4 MG/2ML IJ SOLN: 4.0000 mg | Freq: Four times a day (QID) | INTRAMUSCULAR | Status: DC | PRN

## 2023-11-27 MED ORDER — POTASSIUM CHLORIDE CRYS ER 20 MEQ PO TBCR
40.0000 meq | EXTENDED_RELEASE_TABLET | Freq: Once | ORAL | Status: AC
Start: 2023-11-27 — End: 2023-11-27
  Administered 2023-11-27: 40 meq via ORAL
  Filled 2023-11-27: qty 2

## 2023-11-27 MED ORDER — MORPHINE SULFATE (PF) 2 MG/ML IV SOLN
2.0000 mg | Freq: Once | INTRAVENOUS | Status: AC
  Administered 2023-11-27: 2 mg via INTRAVENOUS
  Filled 2023-11-27: qty 1

## 2023-11-27 NOTE — Assessment & Plan Note (Addendum)
 Status post potassium chloride  40 mill equivalent p.o., one-time dose and potassium chloride  IV, 4 doses were ordered and LR 1 L bolus per EP Recheck potassium level on admission Recheck magnesium  level in the a.m. Lactated ringer  infusion at 100 mL/h, 1 day ordered

## 2023-11-27 NOTE — Assessment & Plan Note (Addendum)
 Patient states the pain just started as I am standing there talking to her With ecchymosis present on admission, suspect secondary to fall at grocery store Left shoulder x-ray ordered on admission

## 2023-11-27 NOTE — Assessment & Plan Note (Signed)
 Symptomatic support: Hydrocodone -acetaminophen  5-325 mg p.o. every 6 hours as needed for moderate pain, 1 day ordered; morphine  2 mg IV every 2 hours as needed for severe pain, 1 day ordered Orthopedic surgeon, Dr. Edie has been consulted and is aware Dr. Edie plan to take patient to the OR tomorrow and requesting that medicine correct patient's electrolyte imbalance N.p.o. after midnight

## 2023-11-27 NOTE — Assessment & Plan Note (Signed)
 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

## 2023-11-27 NOTE — ED Notes (Signed)
 Called floor to notify of patients departure from ED.

## 2023-11-27 NOTE — H&P (Signed)
 History and Physical   Mary Washington FMW:[id number] DOB: 02/24/1946 DOA: 11/27/2023  PCP: Lenon Layman ORN, MD  Outpatient Specialists: Dr. Rennie, medical oncology Patient coming from: Grocery store via EMS  I have personally briefly reviewed patient's old medical records in [facility] EMR.  Chief Concern: Fall  HPI: Mary Washington is a 78 year old female with history of large cell lymphoma, asthma, hypertension, melanoma, history of bowel obstruction status post colostomy status, GERD, who presents ED for chief concerns of a fall at a grocery store.  Vitals in the ED showed temperature of 97.6, respiration rate of 12, heart rate 64, blood pressure 97/83, SpO2 of 97% on room air.  Serum sodium is 138, potassium 2.6, chloride 112, bicarb 21, BUN of 16, serum creatinine 0.62, EGFR greater than 60, nonfasting blood glucose 88, WBC 7.5, hemoglobin 12.9, platelets of 253.  ED treatment: Calcium gluconate 1 g IV one-time dose, LR 1 L bolus, potassium chloride  10 mEq, IV 4 doses ordered, potassium chloride  40 mEq IV one-time dose, morphine  2 mg IV one-time dose.  EDP consulted orthopedic service, Dr. Edie who is aware. ------------------------------- At bedside, patient able to tell me her first and last name, age, location, current calendar year.  She reports that she was at the grocery store and reaching for the watermelon in the watermelon bin and then she fell forward and the watermelon also fell on the floor.  She reports she had left hip pain after that.  Patient reports difficulty and inability to ambulate after that.  She denies chest pain, abdominal pain, shortness of breath, dysuria, hematuria, diarrhea, blood in her stool.  She reports that she is chronically constipated.  She reports her constipation is worse with opioid/IV pain medications.  Social history: Lives at home with her husband, Lamar.  Patient denies tobacco and recreational drug use.  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  Patient infrequently drinks EtOH.  ROS: Constitutional: no weight change, no fever ENT/Mouth: no sore throat, no rhinorrhea Eyes: no eye pain, no vision changes Cardiovascular: no chest pain, no dyspnea,  no edema, no palpitations Respiratory: no cough, no sputum, no wheezing Gastrointestinal: no nausea, no vomiting, no diarrhea, + constipation Genitourinary: no urinary incontinence, no dysuria, no hematuria Musculoskeletal: no arthralgias, no myalgias, + left shoulder pain, + left hip pain Skin: no skin lesions, no pruritus, Neuro: + weakness, no loss of consciousness, no syncope Psych: no anxiety, no depression, + decrease appetite Heme/Lymph: no bruising, no bleeding  ED Course: Discussed with EDP, patient requiring hospitalization for chief concerns of left femoral fracture.  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 every 6 hours as needed for moderate pain, 1 day ordered; morphine  2 mg IV every 2 hours as needed for severe pain, 1 day ordered Orthopedic surgeon, Dr. Edie has been consulted and is aware Dr. Edie plan to take patient to the OR tomorrow and requesting that medicine correct patient's electrolyte imbalance N.p.o. after midnight  Hypokalemia Status post potassium chloride  40 mill equivalent p.o., one-time dose and potassium chloride  IV, 4 doses were ordered and LR 1 L bolus per EP Recheck potassium  level on admission Recheck magnesium  level in the a.m. Lactated ringer  infusion at 100 mL/h, 1 day ordered  Hypocalcemia Etiology workup in progress Check serum ionized calcium,  vitamin D level, phosphorus Magnesium  level was within normal limits AM team to consider consulting nephrology for evaluation of hypoparathyroid disorder  Constipation Patient reports she has chronic infrequent constipation, especially with opioid use Patient requesting for scheduled stool softener and constipation medication Senna docusate p.o. 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 Cycle 6 was discontinued due to complicated sigmoid diverticulitis.  Melanoma (HCC) At left shoulder, status post skin grafting  Left shoulder pain Patient states the pain just started as I am standing there talking to her With ecchymosis present on admission, suspect secondary to fall at grocery store Left shoulder x-ray ordered on admission  Acute diverticulitis Status post left-sided hemicolectomy with reversal of colostomy in May 2023.  Chart reviewed.   DVT prophylaxis: Pharmacologic DVT not initiated on admission.  AM team to initiate pharmacologic DVT when the benefits outweigh the risk. Code Status: Full code Diet: Heart healthy; n.p.o. after midnight Family Communication: A phone call was offered, patient declined stating that her husband already knows she is being admitted to the hospital. Disposition Plan: Pending clinical course, pending orthopedic evaluation and intervention Consults called: Orthopedic service Admission status: Telemetry medical, inpatient  Past Medical History:  Diagnosis Date   Anemia    Arthritis    Colon polyps    Colostomy in place [facility])    Complication of anesthesia    bp dropped with novacaine at dentist office and pt had to be admitted to hospital for 24 hours   Diverticulosis    Family history of adverse reaction to anesthesia    brother-pt unsure of what reaction her brother had   GERD (gastroesophageal reflux disease)    Hyponatremia    Large  cell lymphoma of lymph nodes of neck (HCC)    Melanoma (HCC) 1988   lt upper arm, some lymph nodes removed   Mild intermittent asthma    well controlled   Osteoporosis    Pneumonia 2022   Tachycardia    Past Surgical History:  Procedure Laterality Date   BACK SURGERY     CATARACT EXTRACTION     COLONOSCOPY WITH PROPOFOL  N/A 09/12/2021   Procedure: COLONOSCOPY WITH PROPOFOL ;  Surgeon: Tye Millet, DO;  Location: [facility] ENDOSCOPY;  Service: General;  Laterality: N/A;  Dr Tye can be 12:30 PM  procedure start time   IR IMAGING GUIDED PORT INSERTION  11/23/2020   IR KYPHO THORACIC WITH BONE BIOPSY  08/12/2021   IR RADIOLOGIST EVAL & MGMT  07/23/2021   IR RADIOLOGIST EVAL & MGMT  09/11/2021   IR REMOVAL TUN ACCESS W/ PORT W/O FL MOD SED  07/31/2023   LAPAROSCOPIC LYSIS OF ADHESIONS  10/21/2021   Procedure: LAPAROSCOPIC LYSIS OF ADHESIONS;  Surgeon: Rodolph Romano, MD;  Location: [facility] ORS;  Service: General;;   PARTIAL COLECTOMY N/A 04/24/2021   Procedure: PARTIAL COLECTOMY;  Surgeon: Rodolph Romano, MD;  Location: [facility] ORS;  Service: General;  Laterality: N/A;   TONSILLECTOMY     VAGINAL HYSTERECTOMY  1990   XI ROBOTIC ASSISTED COLOSTOMY TAKEDOWN N/A 10/21/2021   Procedure: XI ROBOTIC ASSISTED COLOSTOMY TAKEDOWN;  Surgeon: Rodolph Romano, MD;  Location: [facility] ORS;  Service: General;  Laterality: N/A;   Social History:  reports that she has  never smoked. She has never used smokeless tobacco. She reports current alcohol use. She reports that she does not use drugs.  No Known Allergies Family History  Problem Relation Age of Onset   Diabetes Mother    Breast cancer Neg Hx    Family history: Family history reviewed and not pertinent.  Prior to Admission medications   Medication Sig Start Date End Date Taking? Authorizing Provider  acetaminophen  (TYLENOL ) 500 MG tablet Take 500 mg by mouth every 6 (six) hours as needed for moderate pain or mild pain.    [provider]  Calcium Carb-Cholecalciferol 600-20 MG-MCG TABS Take 1 tablet by mouth daily. citracal    [provider]  Cholecalciferol (VITAMIN D) 50 MCG (2000 UT) CAPS Take 2,000 Units by mouth daily.    [provider]  diclofenac Sodium (VOLTAREN) 1 % GEL Apply 2 g topically 2 (two) times daily as needed (left leg pain).    [provider]  ibandronate (BONIVA) 150 MG tablet Take 150 mg by mouth every 30 (thirty) days. Take in the morning with a full glass of water , on an empty stomach, and do not take anything else by mouth or lie down for the next 30 min.    [provider]  lidocaine -prilocaine  (EMLA ) cream 1 application. as needed (on her port).    [provider]  Magnesium  Oxide (MAG-OXIDE PO) Take 250 mg by mouth daily at 12 noon.    [provider]  metoprolol  tartrate (LOPRESSOR ) 25 MG tablet Take 1 tablet (25 mg total) by mouth 2 (two) times daily. 05/03/21 07/04/22  Arlice Reichert, MD  Multiple Vitamins-Minerals (WOMENS MULTIVITAMIN PO) Take 1 Tera Vector Genomes by mouth daily.    [provider]  omeprazole (PRILOSEC) 20 MG capsule Take 20 mg by mouth every morning. Patient not taking: Reported on 07/13/2023    [provider]  polyethylene glycol (MIRALAX  / GLYCOLAX ) 17 g packet Take 17 g by mouth daily.    [provider]  loratadine (CLARITIN) 10 MG tablet Take 1 tablet by mouth as needed.  11/23/20  [provider]   Physical Exam: Vitals:   11/27/23 1549 11/27/23 1700 11/27/23 1809 11/27/23 1810  BP:  102/76 (!) 117/58   Pulse:  76 77   Resp:  19 17   Temp:   97.8 F (36.6 C)   SpO2:  94% (!) 87% 93%  Weight: 57.6 kg     Height: 5' (1.524 m)      Constitutional: appears age-appropriate, frail, calm Eyes: PERRL, lids and conjunctivae normal ENMT: Mucous membranes are moist. Posterior pharynx clear of any exudate or lesions. Age-appropriate dentition. Hearing appropriate Neck:  normal, supple, no masses, no thyromegaly Respiratory: clear to auscultation bilaterally, no wheezing, no crackles. Normal respiratory effort. No accessory muscle use.  Cardiovascular: Regular rate and rhythm, no murmurs / rubs / gallops. No extremity edema. 2+ pedal pulses. No carotid bruits.  Abdomen: no tenderness, no masses palpated, no hepatosplenomegaly. Bowel sounds positive.  Musculoskeletal: no clubbing / cyanosis. No joint deformity upper and lower extremities.  Decreased ROM of the left lower extremity.  No contractures, no atrophy. Normal muscle tone.  Skin: no rashes, lesions, ulcers. No induration.  Abdominal scars present.  Left shoulder scarring is present consistent with history of melanoma status post resection and skin graft Neurologic: Sensation intact. Strength 5/5 in all 4.  Psychiatric: Normal judgment and insight. Alert and oriented x 3. Normal mood.   EKG: independently  reviewed, showing sinus rhythm with rate of 62, QTc 464  Chest x-ray on Admission: I personally reviewed and I agree with radiologist reading as below.  DG Chest Port 1 View Result Date: 11/27/2023 CLINICAL DATA:  Fall, left hip fracture. EXAM: PORTABLE CHEST 1 VIEW COMPARISON:  Chest radiograph 04/23/2021 FINDINGS: Patient is rotated. Mild atelectasis at the left greater than right lung base. No pneumothorax or large pleural effusion. Normal heart size. Stable aortic tortuosity. Kyphoplasty within mid and lower thoracic vertebra. On limited assessment, no acute osseous findings. Left axillary surgical clips. IMPRESSION: Mild atelectasis at the left greater than right lung base. Electronically Signed   By: Andrea Gasman M.D.   On: 11/27/2023 16:57   DG Knee Complete 4 Views Left Result Date: 11/27/2023 CLINICAL DATA:  Pain after fall. Fall at [employer] picking up a watermelon. EXAM: LEFT KNEE - COMPLETE 4+ VIEW COMPARISON:  None Available. FINDINGS: The bones are subjectively under mineralized. No evidence  of fracture, dislocation, or joint effusion. The alignment and joint spaces are preserved. No evidence of arthropathy or other focal bone abnormality. Soft tissues are unremarkable. IMPRESSION: 1. No fracture or subluxation of the left knee. 2. Subjective osteopenia/osteoporosis. Electronically Signed   By: Andrea Gasman M.D.   On: 11/27/2023 16:56   DG Hip Unilat W or Wo Pelvis 2-3 Views Left Result Date: 11/27/2023 CLINICAL DATA:  Pain after fall. Fall at [employer] picking up a watermelon. EXAM: DG HIP (WITH OR WITHOUT PELVIS) 2-3V LEFT COMPARISON:  None Available. FINDINGS: Displaced fracture through the femoral neck. Proximal migration of the femoral shaft with mild angulation. The femoral head remains seated. No additional fracture of the pelvis. Pubic rami are intact. Chain sutures noted in the pelvis. IMPRESSION: Displaced left femoral neck fracture. Electronically Signed   By: Andrea Gasman M.D.   On: 11/27/2023 16:55   Labs on Admission: I have personally reviewed following labs  CBC: Recent Labs  Lab 11/27/23 1559  WBC 7.5  NEUTROABS 3.5  HGB 12.9  HCT 38.8  MCV 87.4  PLT 253   Basic Metabolic Panel: Recent Labs  Lab 11/27/23 1559  NA 138  K 2.6*  CL 112*  CO2 21*  GLUCOSE 88  BUN 16  CREATININE 0.62  CALCIUM 6.3*  MG 1.7   GFR: Estimated Creatinine Clearance: 47.5 mL/min (by C-G formula based on SCr of 0.62 mg/dL).  Liver Function Tests: Recent Labs  Lab 11/27/23 1559  AST 21  ALT 15  ALKPHOS 38  BILITOT 0.5  PROT 3.9*  ALBUMIN  2.5*   Urine analysis:    Component Value Date/Time   COLORURINE YELLOW (A) 04/15/2021 0815   APPEARANCEUR CLEAR (A) 04/15/2021 0815   LABSPEC 1.040 (H) 04/15/2021 0815   PHURINE 7.0 04/15/2021 0815   GLUCOSEU NEGATIVE 04/15/2021 0815   HGBUR SMALL (A) 04/15/2021 0815   BILIRUBINUR NEGATIVE 04/15/2021 0815   KETONESUR 5 (A) 04/15/2021 0815   PROTEINUR NEGATIVE 04/15/2021 0815   NITRITE NEGATIVE 04/15/2021 0815    LEUKOCYTESUR NEGATIVE 04/15/2021 0815   This document was prepared using Dragon Voice Recognition software and may include unintentional dictation errors.  Dr. Sherre [facility]  If 7PM-7AM, please contact overnight-coverage provider If 7AM-7PM, please contact day attending provider [email or web address]  11/27/2023, 7:08 PM

## 2023-11-27 NOTE — ED Provider Notes (Signed)
 [facility] Provider Note    Event Date/Time   First MD Initiated Contact with Patient 11/27/23 1552     (approximate)   History   Chief Complaint Fall   HPI  Mary Washington is a 78 y.o. female with past medical history of large cell lymphoma, asthma, melanoma, and bowel obstruction who presents to the ED complaining of fall.  Patient reports that she was at the grocery store today when she bent over to pick up a watermelon, subsequently lost her balance and fell to the ground.  She denies hitting her head or losing consciousness, did strike her left leg with the fall and complains of pain around her hip and knee on that side.  She denies any injuries to her upper extremities, chest, or abdomen, and does not take a blood thinner.  She does report some lightheadedness after the fall, was found to be hypotensive with EMS with BP of 76/40, subsequently improved following 500 cc IV fluid bolus.  Patient states that she began to feel lightheaded after the fall, was feeling well earlier with no fevers, cough, chest pain, shortness of breath, nausea, vomiting, or diarrhea today.     Physical Exam   Triage Vital Signs: ED Triage Vitals  Encounter Vitals Group     BP 11/27/23 1547 97/83     Girls Systolic BP Percentile --      Girls Diastolic BP Percentile --      Boys Systolic BP Percentile --      Boys Diastolic BP Percentile --      Pulse Rate 11/27/23 1547 64     Resp 11/27/23 1547 12     Temp 11/27/23 1547 97.6 F (36.4 C)     Temp src --      SpO2 11/27/23 1547 97 %     Weight 11/27/23 1549 127 lb (57.6 kg)     Height 11/27/23 1549 5' (1.524 m)     Head Circumference --      Peak Flow --      Pain Score 11/27/23 1548 3     Pain Loc --      Pain Education --      Exclude from Growth Chart --     Most recent vital signs: Vitals:   11/27/23 1547 11/27/23 1700  BP: 97/83 102/76  Pulse: 64 76  Resp: 12 19  Temp: 97.6 F (36.4 C)   SpO2: 97% 94%     Constitutional: Alert and oriented. Eyes: Conjunctivae are normal. Head: Atraumatic. Nose: No congestion/rhinnorhea. Mouth/Throat: Mucous membranes are moist.  Neck: No midline cervical spine tenderness to palpation. Cardiovascular: Normal rate, regular rhythm. Grossly normal heart sounds.  2+ radial pulses bilaterally. Respiratory: Normal respiratory effort.  No retractions. Lungs CTAB.  No chest wall tenderness to palpation. Gastrointestinal: Soft and nontender. No distention. Musculoskeletal: Diffuse tenderness to palpation of left hip and knee with no obvious deformity.  No tenderness to palpation of right hip, right knee, or bilateral ankles.  No upper extremity bony tenderness to palpation. Neurologic:  Normal speech and language. No gross focal neurologic deficits are appreciated.    ED Results / Procedures / Treatments   Labs (all labs ordered are listed, but only abnormal results are displayed) Labs Reviewed  COMPREHENSIVE METABOLIC PANEL WITH GFR - Abnormal; Notable for the following components:      Result Value   Potassium 2.6 (*)    Chloride 112 (*)    CO2 21 (*)  Calcium 6.3 (*)    Total Protein 3.9 (*)    Albumin  2.5 (*)    All other components within normal limits  CBC WITH DIFFERENTIAL/PLATELET  MAGNESIUM      EKG  ED ECG REPORT I, Carlin Palin, the attending physician, personally viewed and interpreted this ECG.   Date: 11/27/2023  EKG Time: 15:48  Rate: 62  Rhythm: normal sinus rhythm  Axis: Normal  Intervals:none  ST&T Change: None  RADIOLOGY Left hip x-ray reviewed and interpreted by me with femoral neck fracture, no dislocation.  PROCEDURES:  Critical Care performed: No  Procedures   MEDICATIONS ORDERED IN ED: Medications  potassium chloride  10 mEq in 100 mL IVPB (10 mEq Intravenous New Bag/Given 11/27/23 1652)  calcium gluconate 1 g/ 50 mL sodium chloride  IVPB (1,000 mg Intravenous New Bag/Given 11/27/23 1653)  lactated ringers   bolus 1,000 mL (1,000 mLs Intravenous New Bag/Given 11/27/23 1651)  potassium chloride  SA (KLOR-CON  M) CR tablet 40 mEq (40 mEq Oral Given 11/27/23 1653)  morphine  (PF) 2 MG/ML injection 2 mg (2 mg Intravenous Given 11/27/23 1718)     IMPRESSION / MDM / ASSESSMENT AND PLAN / ED COURSE  I reviewed the triage vital signs and the nursing notes.                              78 y.o. female with past medical history of large cell lymphoma, asthma, melanoma, and bowel obstruction who presents to the ED following fall in a grocery store, reports lightheadedness afterwards and found to be hypotensive with EMS.  Patient's presentation is most consistent with acute presentation with potential threat to life or bodily function.  Differential diagnosis includes, but is not limited to, arrhythmia, ACS, sepsis, anemia, electrolyte abnormality, AKI, dehydration, vasovagal episode, intracranial injury, extremity injury.  Patient nontoxic-appearing and in no acute distress, vital signs are unremarkable as BP has improved following IV fluids with EMS.  She appears well with no symptoms concerning for sepsis or cardiac etiology of the episode today.  EKG without evidence of arrhythmia or ischemia, will observe on cardiac monitor and screen CBC and CMP.  No evidence of traumatic injury to her head or neck, she does complain of some left leg pain and will further assess with x-ray of the hip and knee.  Labs with hypokalemia and hypocalcemia, which we will replete.  No significant AKI and magnesium  level within normal limits, LFTs unremarkable.  No significant anemia or leukocytosis noted.  Patient does have left femoral neck fracture, he is neurovascularly intact distally with strong DP pulse.  No evidence of traumatic injury to her left knee.  Case discussed with Dr. Edie of orthopedics as well as the hospitalist service for admission.      FINAL CLINICAL IMPRESSION(S) / ED DIAGNOSES   Final diagnoses:  Fall,  initial encounter  Closed fracture of left hip, initial encounter (HCC)  Hypokalemia  Hypocalcemia     Rx / DC Orders   ED Discharge Orders     None        Note:  This document was prepared using Dragon voice recognition software and may include unintentional dictation errors.   Palin Carlin, MD 11/27/23 1726

## 2023-11-27 NOTE — Assessment & Plan Note (Signed)
 Status post left-sided hemicolectomy with reversal of colostomy in May 2023.

## 2023-11-27 NOTE — ED Triage Notes (Signed)
 Pt arrived to Ed via [facility] due to fall. Pt reports that [place] was [place] [employer] [place] to [place] up a watermelon when she fell forward. Pt denies LOC, hitting head, and does not take a blood thinner. Complaining of left leg pain. A&Ox4. Pt lives at home.    EMS reports BPs including: 76/40, 66/37. BP is 97/83 upon arrival. Given fluids by EMS.   HR 70 O2 95 CBG 118

## 2023-11-27 NOTE — Hospital Course (Addendum)
 Ms. Tequilla Cousineau is a 78 year old female with history of large cell lymphoma, asthma, hypertension, melanoma, history of bowel obstruction status post colostomy status, GERD, who presents ED for chief concerns of a fall at a grocery store.  Vitals in the ED showed temperature of 97.6, respiration rate of 12, heart rate 64, blood pressure 97/83, SpO2 of 97% on room air.  Serum sodium is 138, potassium 2.6, chloride 112, bicarb 21, BUN of 16, serum creatinine 0.62, EGFR greater than 60, nonfasting blood glucose 88, WBC 7.5, hemoglobin 12.9, platelets of 253.  ED treatment: Calcium gluconate 1 g IV one-time dose, LR 1 L bolus, potassium chloride  10 mEq, IV 4 doses ordered, potassium chloride  40 mEq IV one-time dose, morphine  2 mg IV one-time dose.  EDP consulted orthopedic service, Dr. Edie who is aware.

## 2023-11-27 NOTE — Assessment & Plan Note (Signed)
 Patient reports she has chronic infrequent constipation, especially with opioid use Patient requesting for scheduled stool softener and constipation medication Senna docusate p.o. twice daily scheduled, MiraLAX  17 g nightly ordered on admission

## 2023-11-27 NOTE — ED Notes (Signed)
 Patient transported to X-ray

## 2023-11-27 NOTE — Assessment & Plan Note (Signed)
 Status was 5 cycles of R-CHOP. Cycle 6 was discontinued due to complicated sigmoid diverticulitis.

## 2023-11-27 NOTE — Consult Note (Signed)
 ORTHOPAEDIC CONSULTATION  REQUESTING PHYSICIAN: Cox, Amy N, DO  Chief Complaint:   Left hip pain.  History of Present Illness: Mary Washington is a 78 y.o. female with a history of hyponatremia, osteoporosis, diverticulosis with colostomy, and gastroesophageal reflux disease who normally lives independently.  The patient was in her usual state of health when she went to the grocery store today.  She tried to pick up a watermelon which was on sale, but the watermelon was too heavy, causing her to fall onto her left side.  She was unable to get up so EMS was called and she was brought to the emergency room where x-rays demonstrated a displaced left femoral neck fracture.  The patient has been admitted at this time for definitive management of this injury.  The patient denies any associated injuries.  She did not strike her head or lose consciousness.  The patient also denies any lightheadedness, dizziness, chest pain, shortness of breath, or other symptoms which may have precipitated her fall.  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)    Melanoma (HCC) 1988   lt upper arm, some lymph nodes removed   Mild intermittent asthma    well controlled   Osteoporosis    Pneumonia 2022   Tachycardia    Past Surgical History:  Procedure Laterality Date   BACK SURGERY     CATARACT EXTRACTION     COLONOSCOPY WITH PROPOFOL  N/A 09/12/2021   Procedure: COLONOSCOPY WITH PROPOFOL ;  Surgeon: Tye Millet, DO;  Location: [facility]  ENDOSCOPY;  Service: General;  Laterality: N/A;  Dr Tye can be 12:30 PM  procedure start time   IR IMAGING GUIDED PORT INSERTION  11/23/2020   IR KYPHO THORACIC WITH BONE BIOPSY  08/12/2021   IR RADIOLOGIST EVAL & MGMT  07/23/2021   IR RADIOLOGIST EVAL & MGMT  09/11/2021   IR REMOVAL TUN ACCESS W/ PORT W/O FL MOD SED  07/31/2023   LAPAROSCOPIC LYSIS OF ADHESIONS  10/21/2021   Procedure: LAPAROSCOPIC LYSIS OF ADHESIONS;  Surgeon: Rodolph Romano, MD;  Location: [facility] ORS;  Service: General;;   PARTIAL COLECTOMY N/A 04/24/2021   Procedure: PARTIAL COLECTOMY;  Surgeon: Rodolph Romano, MD;  Location: [facility] ORS;  Service: General;  Laterality: N/A;   TONSILLECTOMY     VAGINAL HYSTERECTOMY  1990   XI ROBOTIC ASSISTED COLOSTOMY TAKEDOWN N/A 10/21/2021   Procedure: XI ROBOTIC ASSISTED COLOSTOMY TAKEDOWN;  Surgeon: Rodolph Romano, MD;  Location: [facility] ORS;  Service: General;  Laterality: N/A;   Social History   Socioeconomic History   Marital status: Married    Spouse name: Clinical biochemist   Number of children: 2   Years of education: Not on file   Highest education level: Not on file  Occupational History   Not on file  Tobacco Use   Smoking status: Never   Smokeless tobacco: Never  Vaping Use   Vaping status: Never Used  Substance and Sexual Activity   Alcohol use: Yes  Comment: rare   Drug use: Never   Sexual activity: Yes    Partners: Male  Other Topics Concern   Not on file  Social History Narrative   ** Merged History Encounter **       Lives at home with husband.    Social Drivers of Corporate investment banker Strain: Low Risk  (04/21/2023)   Received from [facility]   Overall Financial Resource Strain (CARDIA)    Difficulty of Paying Living Expenses: Not hard at all  Food Insecurity: No Food Insecurity (04/21/2023)   [facility]   Hunger Vital Sign    Within the past 12 months, you worried that your food would  run out before you got the money to buy more.: Never true    Within the past 12 months, the food you bought just didn't last and you didn't have money to get more.: Never true  Transportation Needs: No Transportation Needs (04/21/2023)   Received from [facility] - Transportation    In the past 12 months, has lack of transportation kept you from medical appointments or from getting medications?: No    Lack of Transportation (Non-Medical): No  Physical Activity: Not on file  Stress: Not on file  Social Connections: Not on file   Family History  Problem Relation Age of Onset   Diabetes Mother    Breast cancer Neg Hx    No Known Allergies Prior to Admission medications   Medication Sig Start Date End Date Taking? Authorizing Provider  acetaminophen  (TYLENOL ) 500 MG tablet Take 500 mg by mouth every 6 (six) hours as needed for moderate pain or mild pain.    [provider]  Calcium Carb-Cholecalciferol 600-20 MG-MCG TABS Take 1 tablet by mouth daily. citracal    [provider]  Cholecalciferol (VITAMIN D) 50 MCG (2000 UT) CAPS Take 2,000 Units by mouth daily.    [provider]  diclofenac Sodium (VOLTAREN) 1 % GEL Apply 2 g topically 2 (two) times daily as needed (left leg pain).    [provider]  ibandronate (BONIVA) 150 MG tablet Take 150 mg by mouth every 30 (thirty) days. Take in the morning with a full glass of water , on an empty stomach, and do not take anything else by mouth or lie down for the next 30 min.    [provider]  lidocaine -prilocaine  (EMLA ) cream 1 application. as needed (on her port).    [provider]  Magnesium  Oxide (MAG-OXIDE PO) Take 250 mg by mouth daily at 12 noon.    [provider]  metoprolol  tartrate (LOPRESSOR ) 25 MG tablet Take 1 tablet (25 mg total) by mouth 2 (two) times daily. 05/03/21 07/04/22  Arlice Reichert, MD  Multiple Vitamins-Minerals (WOMENS MULTIVITAMIN PO)  Take 1 Tera Vector Genomes by mouth daily.    [provider]  omeprazole (PRILOSEC) 20 MG capsule Take 20 mg by mouth every morning. Patient not taking: Reported on 07/13/2023    [provider]  polyethylene glycol (MIRALAX  / GLYCOLAX ) 17 g packet Take 17 g by mouth daily.    [provider]  loratadine (CLARITIN) 10 MG tablet Take 1 tablet by mouth as needed.  11/23/20  [provider]   DG Chest Port 1 View Result Date: 11/27/2023 CLINICAL DATA:  Fall, left hip fracture. EXAM: PORTABLE CHEST 1 VIEW COMPARISON:  Chest radiograph 04/23/2021 FINDINGS: Patient is rotated. Mild atelectasis at  the left greater than right lung base. No pneumothorax or large pleural effusion. Normal heart size. Stable aortic tortuosity. Kyphoplasty within mid and lower thoracic vertebra. On limited assessment, no acute osseous findings. Left axillary surgical clips. IMPRESSION: Mild atelectasis at the left greater than right lung base. Electronically Signed   By: Andrea Gasman M.D.   On: 11/27/2023 16:57   DG Knee Complete 4 Views Left Result Date: 11/27/2023 CLINICAL DATA:  Pain after fall. Fall at [employer] picking up a watermelon. EXAM: LEFT KNEE - COMPLETE 4+ VIEW COMPARISON:  None Available. FINDINGS: The bones are subjectively under mineralized. No evidence of fracture, dislocation, or joint effusion. The alignment and joint spaces are preserved. No evidence of arthropathy or other focal bone abnormality. Soft tissues are unremarkable. IMPRESSION: 1. No fracture or subluxation of the left knee. 2. Subjective osteopenia/osteoporosis. Electronically Signed   By: Andrea Gasman M.D.   On: 11/27/2023 16:56   DG Hip Unilat W or Wo Pelvis 2-3 Views Left Result Date: 11/27/2023 CLINICAL DATA:  Pain after fall. Fall at [employer] picking up a watermelon. EXAM: DG HIP (WITH OR WITHOUT PELVIS) 2-3V LEFT COMPARISON:  None Available. FINDINGS: Displaced fracture through the femoral neck.  Proximal migration of the femoral shaft with mild angulation. The femoral head remains seated. No additional fracture of the pelvis. Pubic rami are intact. Chain sutures noted in the pelvis. IMPRESSION: Displaced left femoral neck fracture. Electronically Signed   By: Andrea Gasman M.D.   On: 11/27/2023 16:55    Positive ROS: All other systems have been reviewed and were otherwise negative with the exception of those mentioned in the HPI and as above.  Physical Exam: General:  Alert, no acute distress Psychiatric:  Patient is competent for consent with normal mood and affect   Cardiovascular:  No pedal edema Respiratory:  No wheezing, non-labored breathing GI:  Abdomen is soft and non-tender Skin:  No lesions in the area of chief complaint Neurologic:  Sensation intact distally Lymphatic:  No axillary or cervical lymphadenopathy  Orthopedic Exam:  Orthopedic exam and patient is limited to the left hip and lower extremity.  The left lower extremity is somewhat shortened and externally rotated as compared to the right.  Skin inspection around the left hip is unremarkable.  No swelling, erythema, ecchymosis, abrasions, or other skin abnormalities are identified.  She notes mild discomfort to palpation over the anterolateral aspect of the hip.  She has more severe pain with any attempted active or passive motion of the hip.  She is grossly neurovascularly intact to the left lower extremity and foot, demonstrating the ability to dorsiflex and plantarflex her toes and ankle.  Sensation is intact light touch to all distributions.  She has good capillary refill to her left foot.  X-rays:  Recent x-rays of the pelvis and left hip are available for review.  The findings are as described above.  There is a varus displaced left femoral neck fracture.  No significant degenerative changes of the hip joint are noted.  No lytic lesions or other acute bony abnormalities are identified.  Assessment: Displaced  left femoral neck fracture.  Plan: The treatment options, including both surgical and nonsurgical choices, have been discussed in detail with the patient.  The patient agrees to proceed with surgical intervention to include a left hip hemiarthroplasty.  The risks (including bleeding, infection, nerve and/or blood vessel injury, persistent or recurrent pain, loosening or failure of the components, leg length inequality, dislocation, need for  further surgery, blood clots, strokes, heart attacks or arrhythmias, pneumonia, etc.) and benefits of the surgical procedure were discussed.  The patient states her understanding and agrees to proceed.  A formal written consent will be obtained by the nursing staff.  Thank you for asking me to participate in the care of this most pleasant yet unfortunate woman.  I will be happy to follow her with you.   DOROTHA Reyes Maltos, MD  Beeper #:  [phone]  11/27/2023 6:23 PM

## 2023-11-27 NOTE — Assessment & Plan Note (Addendum)
 At left shoulder, status post skin grafting

## 2023-11-28 ENCOUNTER — Inpatient Hospital Stay: Admitting: Anesthesiology

## 2023-11-28 ENCOUNTER — Encounter
Admission: EM | Disposition: A | Discharge status: Skilled Nursing Facility | Payer: Self-pay | Source: Home / Self Care | Attending: Student

## 2023-11-28 ENCOUNTER — Encounter: Admitting: Anesthesiology

## 2023-11-28 ENCOUNTER — Other Ambulatory Visit: Payer: Self-pay

## 2023-11-28 ENCOUNTER — Inpatient Hospital Stay

## 2023-11-28 ENCOUNTER — Encounter: Payer: Self-pay | Admitting: Internal Medicine

## 2023-11-28 DIAGNOSIS — S72002A Fracture of unspecified part of neck of left femur, initial encounter for closed fracture: Secondary | ICD-10-CM

## 2023-11-28 HISTORY — PX: HIP ARTHROPLASTY: SHX981

## 2023-11-28 LAB — BASIC METABOLIC PANEL WITH GFR
Anion gap: 6 (ref 5–15)
BUN: 16 mg/dL (ref 8–23)
CO2: 23 mmol/L (ref 22–32)
Calcium: 8.7 mg/dL — ABNORMAL LOW (ref 8.9–10.3)
Chloride: 97 mmol/L — ABNORMAL LOW (ref 98–111)
Creatinine, Ser: 0.63 mg/dL (ref 0.44–1.00)
GFR, Estimated: >60 mL/min (ref >60)
Glucose, Bld: 127 mg/dL — ABNORMAL HIGH (ref 70–99)
Potassium: 4.7 mmol/L (ref 3.5–5.1)
Sodium: 126 mmol/L — ABNORMAL LOW (ref 135–145)

## 2023-11-28 LAB — CBC
HCT: 41 % (ref 36.0–46.0)
Hemoglobin: 13.9 g/dL (ref 12.0–15.0)
MCH: 28.5 pg (ref 26.0–34.0)
MCHC: 33.9 g/dL (ref 30.0–36.0)
MCV: 84.2 fL (ref 80.0–100.0)
Platelets: 233 10*3/uL (ref 150–400)
RBC: 4.87 MIL/uL (ref 3.87–5.11)
RDW: 13.5 % (ref 11.5–15.5)
WBC: 9.9 10*3/uL (ref 4.0–10.5)
nRBC: 0 % (ref 0.0–0.2)

## 2023-11-28 LAB — OSMOLALITY: Osmolality: 259 mosm/kg — ABNORMAL LOW (ref 275–295)

## 2023-11-28 LAB — SODIUM, URINE, RANDOM: Sodium, Ur: 44 mmol/L

## 2023-11-28 LAB — OSMOLALITY, URINE: Osmolality, Ur: 631 mosm/kg (ref 300–900)

## 2023-11-28 LAB — MAGNESIUM: Magnesium: 2.1 mg/dL (ref 1.7–2.4)

## 2023-11-28 LAB — VITAMIN D 25 HYDROXY (VIT D DEFICIENCY, FRACTURES): Vit D, 25-Hydroxy: 63.31 ng/mL (ref 30–100)

## 2023-11-28 SURGERY — HEMIARTHROPLASTY (BIPOLAR) HIP, POSTERIOR APPROACH FOR FRACTURE
Anesthesia: General | Site: Hip | Laterality: Left

## 2023-11-28 MED ORDER — METOCLOPRAMIDE HCL 5 MG/ML IJ SOLN: 5.0000 mg | Freq: Three times a day (TID) | INTRAMUSCULAR | Status: DC | PRN

## 2023-11-28 MED ORDER — ACETAMINOPHEN 325 MG PO TABS
325.0000 mg | ORAL_TABLET | Freq: Four times a day (QID) | ORAL | Status: DC | PRN
  Administered 2023-11-29: 650 mg via ORAL
  Filled 2023-11-28: qty 2

## 2023-11-28 MED ORDER — SUGAMMADEX SODIUM 200 MG/2ML IV SOLN
INTRAVENOUS | Status: DC | PRN
  Administered 2023-11-28: 200 mg via INTRAVENOUS

## 2023-11-28 MED ORDER — FLEET ENEMA RE ENEM: 1.0000 | ENEMA | Freq: Once | RECTAL | Status: DC | PRN

## 2023-11-28 MED ORDER — ONDANSETRON HCL 4 MG/2ML IJ SOLN: 4.0000 mg | Freq: Four times a day (QID) | INTRAMUSCULAR | Status: DC | PRN

## 2023-11-28 MED ORDER — SODIUM CHLORIDE 0.9 % IV SOLN
INTRAVENOUS | Status: DC | PRN
  Administered 2023-11-28: 100 mL via TOPICAL

## 2023-11-28 MED ORDER — CEFAZOLIN SODIUM-DEXTROSE 2-4 GM/100ML-% IV SOLN
INTRAVENOUS | Status: AC
  Filled 2023-11-28: qty 100

## 2023-11-28 MED ORDER — HYDROMORPHONE HCL 1 MG/ML IJ SOLN
INTRAMUSCULAR | Status: AC
  Filled 2023-11-28: qty 1

## 2023-11-28 MED ORDER — SODIUM CHLORIDE (PF) 0.9 % IJ SOLN
INTRAMUSCULAR | Status: AC
Start: 2023-11-28 — End: 2023-11-28
  Filled 2023-11-28: qty 10

## 2023-11-28 MED ORDER — FENTANYL CITRATE (PF) 100 MCG/2ML IJ SOLN: 25.0000 ug | INTRAMUSCULAR | Status: DC | PRN

## 2023-11-28 MED ORDER — TRIAMCINOLONE ACETONIDE 40 MG/ML IJ SUSP
INTRAMUSCULAR | Status: AC
  Filled 2023-11-28: qty 1

## 2023-11-28 MED ORDER — 0.9 % SODIUM CHLORIDE (POUR BTL) OPTIME
TOPICAL | Status: DC | PRN
  Administered 2023-11-28: 500 mL

## 2023-11-28 MED ORDER — PROPOFOL 1000 MG/100ML IV EMUL
INTRAVENOUS | Status: AC
  Filled 2023-11-28: qty 100

## 2023-11-28 MED ORDER — PROPOFOL 10 MG/ML IV BOLUS
INTRAVENOUS | Status: DC | PRN
  Administered 2023-11-28: 60 mg via INTRAVENOUS

## 2023-11-28 MED ORDER — ACETAMINOPHEN 10 MG/ML IV SOLN
INTRAVENOUS | Status: DC | PRN
  Administered 2023-11-28: 1000 mg via INTRAVENOUS

## 2023-11-28 MED ORDER — PHENYLEPHRINE 80 MCG/ML (10ML) SYRINGE FOR IV PUSH (FOR BLOOD PRESSURE SUPPORT)
PREFILLED_SYRINGE | INTRAVENOUS | Status: DC | PRN
  Administered 2023-11-28 (×5): 160 ug via INTRAVENOUS
  Administered 2023-11-28: 80 ug via INTRAVENOUS

## 2023-11-28 MED ORDER — ACETAMINOPHEN 10 MG/ML IV SOLN
INTRAVENOUS | Status: AC
  Filled 2023-11-28: qty 100

## 2023-11-28 MED ORDER — ROCURONIUM BROMIDE 10 MG/ML (PF) SYRINGE
PREFILLED_SYRINGE | INTRAVENOUS | Status: DC | PRN
Start: 2023-11-28 — End: 2023-11-28
  Administered 2023-11-28: 40 mg via INTRAVENOUS
  Administered 2023-11-28: 20 mg via INTRAVENOUS

## 2023-11-28 MED ORDER — TRANEXAMIC ACID-NACL 1000-0.7 MG/100ML-% IV SOLN
INTRAVENOUS | Status: DC | PRN
  Administered 2023-11-28: 1000 mg via INTRAVENOUS

## 2023-11-28 MED ORDER — ONDANSETRON HCL 4 MG/2ML IJ SOLN
INTRAMUSCULAR | Status: AC
Start: 2023-11-28 — End: 2023-11-28
  Filled 2023-11-28: qty 2

## 2023-11-28 MED ORDER — OXYCODONE HCL 5 MG PO TABS: 5.0000 mg | ORAL_TABLET | Freq: Once | ORAL | Status: DC | PRN

## 2023-11-28 MED ORDER — DOCUSATE SODIUM 100 MG PO CAPS: 100.0000 mg | ORAL_CAPSULE | Freq: Every day | ORAL | Status: DC | PRN

## 2023-11-28 MED ORDER — PHENYLEPHRINE 80 MCG/ML (10ML) SYRINGE FOR IV PUSH (FOR BLOOD PRESSURE SUPPORT)
PREFILLED_SYRINGE | INTRAVENOUS | Status: AC
  Filled 2023-11-28: qty 10

## 2023-11-28 MED ORDER — TRIAMCINOLONE ACETONIDE 40 MG/ML IJ SUSP
INTRAMUSCULAR | Status: DC | PRN
  Administered 2023-11-28: 62 mL via INTRAMUSCULAR

## 2023-11-28 MED ORDER — ONDANSETRON HCL 4 MG PO TABS: 4.0000 mg | ORAL_TABLET | Freq: Four times a day (QID) | ORAL | Status: DC | PRN

## 2023-11-28 MED ORDER — ENOXAPARIN SODIUM 40 MG/0.4ML IJ SOSY
40.0000 mg | PREFILLED_SYRINGE | INTRAMUSCULAR | Status: DC
  Administered 2023-11-29 – 2023-12-02 (×4): 40 mg via SUBCUTANEOUS
  Filled 2023-11-28 (×4): qty 0.4

## 2023-11-28 MED ORDER — PHENYLEPHRINE HCL (PRESSORS) 10 MG/ML IV SOLN
INTRAVENOUS | Status: AC
  Filled 2023-11-28: qty 1

## 2023-11-28 MED ORDER — ACETAMINOPHEN 500 MG PO TABS
500.0000 mg | ORAL_TABLET | Freq: Four times a day (QID) | ORAL | Status: AC
  Administered 2023-11-28 – 2023-11-29 (×4): 500 mg via ORAL
  Filled 2023-11-28 (×4): qty 1

## 2023-11-28 MED ORDER — KETOROLAC TROMETHAMINE 30 MG/ML IJ SOLN
INTRAMUSCULAR | Status: AC
  Filled 2023-11-28: qty 1

## 2023-11-28 MED ORDER — BISACODYL 10 MG RE SUPP: 10.0000 mg | Freq: Every day | RECTAL | Status: DC | PRN

## 2023-11-28 MED ORDER — BUPIVACAINE-EPINEPHRINE (PF) 0.5% -1:200000 IJ SOLN
INTRAMUSCULAR | Status: AC
  Filled 2023-11-28: qty 30

## 2023-11-28 MED ORDER — ONDANSETRON HCL 4 MG/2ML IJ SOLN
INTRAMUSCULAR | Status: DC | PRN
  Administered 2023-11-28: 4 mg via INTRAVENOUS

## 2023-11-28 MED ORDER — SODIUM CHLORIDE 0.9 % IR SOLN
Status: DC | PRN
  Administered 2023-11-28: 3000 mL

## 2023-11-28 MED ORDER — OXYCODONE HCL 5 MG/5ML PO SOLN: 5.0000 mg | Freq: Once | ORAL | Status: DC | PRN

## 2023-11-28 MED ORDER — DIPHENHYDRAMINE HCL 12.5 MG/5ML PO ELIX: 12.5000 mg | ORAL_SOLUTION | ORAL | Status: DC | PRN

## 2023-11-28 MED ORDER — LACTATED RINGERS IV SOLN: INTRAVENOUS | Status: AC

## 2023-11-28 MED ORDER — HYDROMORPHONE HCL 1 MG/ML IJ SOLN
INTRAMUSCULAR | Status: DC | PRN
  Administered 2023-11-28 (×2): .5 mg via INTRAVENOUS

## 2023-11-28 MED ORDER — CEFAZOLIN SODIUM-DEXTROSE 2-4 GM/100ML-% IV SOLN
2.0000 g | Freq: Four times a day (QID) | INTRAVENOUS | Status: AC
  Administered 2023-11-28 (×2): 2 g via INTRAVENOUS
  Filled 2023-11-28 (×2): qty 100

## 2023-11-28 MED ORDER — DEXAMETHASONE SODIUM PHOSPHATE 10 MG/ML IJ SOLN
INTRAMUSCULAR | Status: DC | PRN
  Administered 2023-11-28: 10 mg via INTRAVENOUS

## 2023-11-28 MED ORDER — DEXMEDETOMIDINE HCL IN NACL 80 MCG/20ML IV SOLN
INTRAVENOUS | Status: DC | PRN
Start: 2023-11-28 — End: 2023-11-28
  Administered 2023-11-28: 8 ug via INTRAVENOUS

## 2023-11-28 MED ORDER — DOCUSATE SODIUM 100 MG PO CAPS
100.0000 mg | ORAL_CAPSULE | Freq: Two times a day (BID) | ORAL | Status: DC
  Administered 2023-11-28 – 2023-12-01 (×6): 100 mg via ORAL
  Filled 2023-11-28 (×6): qty 1

## 2023-11-28 MED ORDER — FENTANYL CITRATE (PF) 100 MCG/2ML IJ SOLN
INTRAMUSCULAR | Status: DC | PRN
  Administered 2023-11-28 (×2): 50 ug via INTRAVENOUS

## 2023-11-28 MED ORDER — MAGNESIUM HYDROXIDE 400 MG/5ML PO SUSP: 30.0000 mL | Freq: Every day | ORAL | Status: DC | PRN

## 2023-11-28 MED ORDER — DEXAMETHASONE SODIUM PHOSPHATE 10 MG/ML IJ SOLN
INTRAMUSCULAR | Status: AC
  Filled 2023-11-28: qty 1

## 2023-11-28 MED ORDER — BUPIVACAINE LIPOSOME 1.3 % IJ SUSP
INTRAMUSCULAR | Status: AC
  Filled 2023-11-28: qty 20

## 2023-11-28 MED ORDER — FENTANYL CITRATE (PF) 100 MCG/2ML IJ SOLN
INTRAMUSCULAR | Status: AC
Start: 2023-11-28 — End: 2023-11-28
  Filled 2023-11-28: qty 2

## 2023-11-28 MED ORDER — METOCLOPRAMIDE HCL 10 MG PO TABS: 5.0000 mg | ORAL_TABLET | Freq: Three times a day (TID) | ORAL | Status: DC | PRN

## 2023-11-28 MED ORDER — LACTATED RINGERS IV SOLN: INTRAVENOUS | Status: DC

## 2023-11-28 MED ORDER — ACETAMINOPHEN 10 MG/ML IV SOLN: 15.0000 mg/kg | Freq: Once | INTRAVENOUS | Status: DC | PRN

## 2023-11-28 MED ORDER — ROCURONIUM BROMIDE 10 MG/ML (PF) SYRINGE
PREFILLED_SYRINGE | INTRAVENOUS | Status: AC
Start: 2023-11-28 — End: 2023-11-28
  Filled 2023-11-28: qty 10

## 2023-11-28 SURGICAL SUPPLY — 58 items
BAG DECANTER FOR FLEXI CONT (MISCELLANEOUS) IMPLANT
BLADE SAGITTAL WIDE XTHICK NO (BLADE) ×1 IMPLANT
BLADE SURG SZ20 CARB STEEL (BLADE) ×1 IMPLANT
BOWL CEMENT MIXING ADV NOZZLE (MISCELLANEOUS) IMPLANT
CEMENT BONE 40GM (Cement) IMPLANT
CENTRALIZER STM 11XPSTNR STRL (Orthopedic Implant) IMPLANT
CHLORAPREP W/TINT 26 (MISCELLANEOUS) ×2 IMPLANT
CUP ARTIC RINGLOC HIP 28X44 (Orthopedic Implant) IMPLANT
DRAPE IMP U-DRAPE 54X76 (DRAPES) ×1 IMPLANT
DRAPE INCISE IOBAN 66X60 STRL (DRAPES) ×1 IMPLANT
DRAPE SURG 17X11 SM STRL (DRAPES) ×1 IMPLANT
DRAPE SURG 17X23 STRL (DRAPES) ×1 IMPLANT
DRSG MEPILEX SACRM 8.7X9.8 (GAUZE/BANDAGES/DRESSINGS) IMPLANT
DRSG OPSITE POSTOP 4X12 (GAUZE/BANDAGES/DRESSINGS) IMPLANT
DRSG OPSITE POSTOP 4X8 (GAUZE/BANDAGES/DRESSINGS) IMPLANT
DRSG XEROFORM 1X8 (GAUZE/BANDAGES/DRESSINGS) IMPLANT
ELECT BLADE 6.5 EXT (BLADE) IMPLANT
ELECT CAUTERY BLADE 6.4 (BLADE) ×1 IMPLANT
ELECTRODE REM PT RTRN 9FT ADLT (ELECTROSURGICAL) ×1 IMPLANT
GAUZE PACK 2X3YD (PACKING) IMPLANT
GAUZE XEROFORM 1X8 LF (GAUZE/BANDAGES/DRESSINGS) ×1 IMPLANT
GLOVE BIO SURGEON STRL SZ7.5 (GLOVE) IMPLANT
GLOVE BIO SURGEON STRL SZ8 (GLOVE) ×3 IMPLANT
GLOVE BIOGEL PI IND STRL 8 (GLOVE) ×1 IMPLANT
GOWN STRL REUS W/ TWL LRG LVL3 (GOWN DISPOSABLE) ×1 IMPLANT
GOWN STRL REUS W/ TWL XL LVL3 (GOWN DISPOSABLE) ×1 IMPLANT
HEAD MOD COCR 28MM HD -3MM NK (Orthopedic Implant) IMPLANT
HOOD PEEL AWAY T7 (MISCELLANEOUS) ×3 IMPLANT
IV NS 100ML SINGLE PACK (IV SOLUTION) IMPLANT
KIT PREP HIP W/CEMENT RESTRICT (Miscellaneous) IMPLANT
LABEL OR SOLS (LABEL) ×1 IMPLANT
MANIFOLD NEPTUNE II (INSTRUMENTS) ×1 IMPLANT
NDL FILTER BLUNT 18X1 1/2 (NEEDLE) ×1 IMPLANT
NDL SAFETY ECLIPSE 18X1.5 (NEEDLE) ×1 IMPLANT
NDL SPNL 20GX3.5 QUINCKE YW (NEEDLE) ×1 IMPLANT
NEEDLE FILTER BLUNT 18X1 1/2 (NEEDLE) ×1 IMPLANT
NEEDLE SPNL 20GX3.5 QUINCKE YW (NEEDLE) ×1 IMPLANT
NS IRRIG 500ML POUR BTL (IV SOLUTION) ×1 IMPLANT
PACK HIP PROSTHESIS (MISCELLANEOUS) ×1 IMPLANT
PENCIL SMOKE EVACUATOR (MISCELLANEOUS) IMPLANT
SOL .9 NS 3000ML IRR UROMATIC (IV SOLUTION) ×2 IMPLANT
SPIKE FLUID TRANSFER (MISCELLANEOUS) ×2 IMPLANT
SPONGE T-LAP 18X18 ~~LOC~~+RFID (SPONGE) ×2 IMPLANT
STAPLER SKIN PROX 35W (STAPLE) ×1 IMPLANT
STEM HIP FEMORAL 9MMX130MM (Stem) IMPLANT
STRAP SAFETY 5IN WIDE (MISCELLANEOUS) ×1 IMPLANT
SUT TICRON 2-0 30IN 311381 (SUTURE) ×4 IMPLANT
SUT VIC AB 0 CT1 36 (SUTURE) ×1 IMPLANT
SUT VIC AB 1 CT1 36 (SUTURE) ×1 IMPLANT
SUT VIC AB 2-0 CT1 (SUTURE) ×2 IMPLANT
SYR 10ML LL (SYRINGE) ×1 IMPLANT
SYR 30ML LL (SYRINGE) ×3 IMPLANT
SYR TB 1ML LL NO SAFETY (SYRINGE) IMPLANT
TIP BRUSH PULSAVAC PLUS 24.33 (MISCELLANEOUS) IMPLANT
TIP FAN IRRIG PULSAVAC PLUS (DISPOSABLE) ×1 IMPLANT
TIP IRRIG/SUCT HIGH CAPACITY (MISCELLANEOUS) IMPLANT
TRAP FLUID SMOKE EVACUATOR (MISCELLANEOUS) ×2 IMPLANT
WATER STERILE IRR 1000ML POUR (IV SOLUTION) ×1 IMPLANT

## 2023-11-28 NOTE — Transfer of Care (Signed)
 Immediate Anesthesia Transfer of Care Note  Patient: Mary Washington  Procedure(s) Performed: HEMIARTHROPLASTY (BIPOLAR) HIP, POSTERIOR APPROACH FOR FRACTURE (Left: Hip)  Patient Location: PACU  Anesthesia Type:General  Level of Consciousness: drowsy  Airway & Oxygen Therapy: Patient Spontanous Breathing and Patient connected to face mask oxygen  Post-op Assessment: Report given to RN, Post -op Vital signs reviewed and stable, and Patient moving all extremities  Post vital signs: Reviewed and stable  Last Vitals:  Vitals Value Taken Time  BP 118/52 11/28/23 10:00  Temp    Pulse 90 11/28/23 10:09  Resp 15 11/28/23 10:09  SpO2 99 % 11/28/23 10:09  Vitals shown include unfiled device data.  Last Pain:  Vitals:   11/28/23 0655  PainSc: 3          Complications: No notable events documented.

## 2023-11-28 NOTE — Progress Notes (Signed)
 OT Cancellation Note  Patient Details Name: Mary Washington MRN: [id number] DOB: 03-Nov-1945   Cancelled Treatment:    Reason Eval/Treat Not Completed: Patient at procedure or test/ unavailable. OT order received. Chart reviewed. Pt currently OTF for procedure. Will hold at this time and re-attempt at a later date/time as available and pt medically appropriate for OT evaluation.   Mary Washington 11/28/2023, 8:14 AM

## 2023-11-28 NOTE — Anesthesia Preprocedure Evaluation (Addendum)
 Anesthesia Evaluation  Patient identified by MRN, date of birth, ID band Patient awake    Reviewed: Allergy & Precautions, NPO status , Patient's Chart, lab work & pertinent test results  History of Anesthesia Complications Negative for: history of anesthetic complications  Airway Mallampati: IV   Neck ROM: Full    Dental  (+) Missing   Pulmonary asthma    Pulmonary exam normal breath sounds clear to auscultation       Cardiovascular Exercise Tolerance: Good Normal cardiovascular exam Rhythm:Regular Rate:Normal  ECG 11/27/23:  Sinus or ectopic atrial rhythm Anteroseptal infarct, age indeterminate   Neuro/Psych  Neuromuscular disease (neuropathy)    GI/Hepatic ,GERD  ,,Sigmoid diverticulitis s/p colectomy   Endo/Other  Prediabetes   Renal/GU negative Renal ROS     Musculoskeletal  (+) Arthritis ,    Abdominal   Peds  Hematology  (+) Blood dyscrasia, anemia Large cell lymphoma s/p chemo   Anesthesia Other Findings   Reproductive/Obstetrics                             Anesthesia Physical Anesthesia Plan  ASA: 3  Anesthesia Plan: Spinal and General   Post-op Pain Management:    Induction: Intravenous  PONV Risk Score and Plan: 3 and Propofol  infusion, TIVA, Treatment may vary due to age or medical condition and Ondansetron   Airway Management Planned: Natural Airway and Nasal Cannula  Additional Equipment:   Intra-op Plan:   Post-operative Plan:   Informed Consent: I have reviewed the patients History and Physical, chart, labs and discussed the procedure including the risks, benefits and alternatives for the proposed anesthesia with the patient or authorized representative who has indicated his/her understanding and acceptance.       Plan Discussed with: CRNA  Anesthesia Plan Comments: (Plan for spinal and GA with natural airway, LMA/GETA backup.  Patient consented for  risks of anesthesia including but not limited to:  - adverse reactions to medications - damage to eyes, teeth, lips or other oral mucosa - nerve damage due to positioning  - sore throat or hoarseness - headache, bleeding, infection, nerve damage 2/2 spinal - damage to heart, brain, nerves, lungs, other parts of body or loss of life  Informed patient about role of CRNA in peri- and intra-operative care.  Patient voiced understanding.)        Anesthesia Quick Evaluation

## 2023-11-28 NOTE — Anesthesia Procedure Notes (Signed)
 Procedure Name: Intubation Date/Time: 11/28/2023 8:12 AM  Performed by: Landy Francena BIRCH, CRNAPre-anesthesia Checklist: Patient identified, Emergency Drugs available, Suction available and Patient being monitored Patient Re-evaluated:Patient Re-evaluated prior to induction Oxygen Delivery Method: Circle system utilized Preoxygenation: Pre-oxygenation with 100% oxygen Induction Type: IV induction Ventilation: Mask ventilation without difficulty Laryngoscope Size: McGrath and 3 Grade View: Grade II Tube type: Oral Tube size: 7.0 mm Number of attempts: 1 Airway Equipment and Method: Stylet and Oral airway Placement Confirmation: ETT inserted through vocal cords under direct vision, positive ETCO2 and breath sounds checked- equal and bilateral Secured at: 21 cm Tube secured with: Tape Dental Injury: Teeth and Oropharynx as per pre-operative assessment

## 2023-11-28 NOTE — Plan of Care (Signed)

## 2023-11-28 NOTE — Progress Notes (Addendum)
 PT Cancellation Note  Patient Details Name: Mary Washington MRN: [id number] DOB: Feb 01, 1946   Cancelled Treatment:    Reason Eval/Treat Not Completed: Patient at procedure or test/unavailable. PT order received and pt chart reviewed. Pt currently OTF for procedure. Will hold at this time and re-attempt at a later date/time as appropriate.  ADDENDUM 1218: Pt will be seen for PT evaluation POD#1 (tomorrow) per department protocol.   Camie CHARLENA Kluver, PT, DPT 8:20 AM,11/28/23 Physical Therapist - [facility]

## 2023-11-28 NOTE — Anesthesia Postprocedure Evaluation (Signed)
 Anesthesia Post Note  Patient: Mary Washington  Procedure(s) Performed: HEMIARTHROPLASTY (BIPOLAR) HIP, POSTERIOR APPROACH FOR FRACTURE (Left: Hip)  Patient location during evaluation: PACU Anesthesia Type: General Level of consciousness: awake and alert, oriented and patient cooperative Pain management: pain level controlled Vital Signs Assessment: post-procedure vital signs reviewed and stable Respiratory status: spontaneous breathing, nonlabored ventilation and respiratory function stable Cardiovascular status: blood pressure returned to baseline and stable Postop Assessment: adequate PO intake Anesthetic complications: no   No notable events documented.   Last Vitals:  Vitals:   11/28/23 1045 11/28/23 1103  BP: 122/60 131/63  Pulse: 84 81  Resp: 14 17  Temp: 36.6 C 36.5 C  SpO2: 96% 93%    Last Pain:  Vitals:   11/28/23 1045  PainSc: Asleep                 Alfonso Ruths

## 2023-11-28 NOTE — Progress Notes (Addendum)
 Progress Note   Mary Washington: Mary Washington DOB: 17-Jun-1945 DOA: 11/27/2023     1 DOS: the Mary Washington was seen and examined on 11/28/2023   Brief hospital course: Mary Washington is a 78 year old female with history of large cell lymphoma, asthma, hypertension, melanoma, history of bowel obstruction status post colostomy status, GERD, who presents ED for chief concerns of a fall at a grocery store.  Upon arrival to the ED hip x-ray showed displaced left femoral neck fracture.  Mary Washington was subsequently seen by orthopedic surgeon and taking for fracture repair on 11/28/2023.  Assessment and Plan:  * Closed left femoral fracture (HCC) Mary Washington was taken for surgical repair today by orthopedic surgeon Dr. Edie  Continue postoperative care Continue as needed pain medication Orthopedic surgeon on board and case discussed Pending PT OT eval   Hypokalemia-improved Continue to monitor and replete as needed   Hyponatremia likely secondary to SIADH Mary Washington with sodium of 126 She did receive IV fluid on presentation Currently appears euvolemic Serum osmolarity 259, urine sodium 44, urine osmolarity 631 in keeping with SIADH picture We will fluid restrict Monitor sodium level closely  Hypocalcemia-improved Mary Washington presented with calcium 6.3 Calcium today of 8.7 with albumin  of 2.5 therefore corrected calcium within normal limit Monitor closely   Constipation Continue senna docusate p.o. 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 Cycle 6 was discontinued due to complicated sigmoid diverticulitis.   Melanoma (HCC) At left shoulder, status post skin grafting   Left shoulder pain X-ray showed osteopenia but no acute fracture or dislocation Continue as needed pain medication  Code Status: Full code  Disposition Plan: Pending PT OT eval  Subjective:  Mary Washington seen in the  perioperative area Denies nausea vomiting abdominal pain chest pain or cough Still a little bit drowsy from her anesthesia She did have some minimal pain at the surgical site  Physical Exam:  Constitutional: appears age-appropriate, frail, calm Respiratory: clear to auscultation bilaterally, no wheezing, no crackles. Normal respiratory effort. No accessory muscle use.  Cardiovascular: Regular rate and rhythm, no murmurs / rubs / gallops. No extremity edema. 2+ pedal pulses. No carotid bruits.  Abdomen: no tenderness, no masses palpated, no hepatosplenomegaly. Bowel sounds positive.  Musculoskeletal: Dressing clean and dry with no bleeding noted to the left hip Skin: no rashes, lesions, ulcers. No induration.  Abdominal scars present.  Left shoulder scarring is present consistent with history of melanoma status post resection and skin graft Neurologic: Sensation intact. Strength 5/5 in all 4.  Psychiatric: Normal judgment and insight. Alert and oriented x 3. Normal mood.   Vitals:   11/28/23 1015 11/28/23 1030 11/28/23 1045 11/28/23 1103  BP: 121/70 125/67 122/60 131/63  Pulse: 95 91 84 81  Resp: 16 (!) 24 14 17   Temp:   97.8 F (36.6 C) 97.7 F (36.5 C)  SpO2: 99% 100% 96% 93%  Weight:      Height:        Data Reviewed:    Latest Ref Rng & Units 11/28/2023    5:39 AM 11/27/2023    3:59 PM 07/13/2023   10:26 AM  CBC  WBC 4.0 - 10.5 K/uL 9.9  7.5  6.4   Hemoglobin 12.0 - 15.0 g/dL 86.0  87.0  84.9   Hematocrit 36.0 - 46.0 % 41.0  38.8  44.7   Platelets 150 - 400 K/uL 233  253  235        Latest Ref Rng & Units 11/28/2023    5:39 AM 11/27/2023    7:29 PM 11/27/2023    3:59 PM  BMP  Glucose 70 - 99 mg/dL 872   88   BUN 8 - 23 mg/dL 16   16   Creatinine 9.55 - 1.00 mg/dL 9.36   9.37   Sodium 864 - 145 mmol/L 126   138   Potassium 3.5 - 5.1 mmol/L 4.7  4.1  2.6   Chloride 98 - 111 mmol/L 97   112   CO2 22 - 32 mmol/L 23   21   Calcium 8.9 - 10.3 mg/dL 8.7   6.3       Family Communication: None at bedside  Disposition: Status is: Inpatient   Time spent: 55 minutes  Author: Drue ONEIDA Potter, MD 11/28/2023 12:44 PM  For on call review [email or web address].

## 2023-11-28 NOTE — Op Note (Signed)
 11/28/2023  10:02 AM  Patient:   Mary Washington  Pre-Op Diagnosis:   Displaced femoral neck fracture, left hip.  Post-Op Diagnosis:   Same.  Procedure:   Left hip bipolar hemiarthroplasty.  Surgeon:   DOROTHA Reyes Maltos, MD  Assistant:   Gustavo Level, PA-C  Anesthesia:   Attempted spinal --> GET  Findings:   As above.  Complications:   None  EBL:   125 cc  Fluids:   800 cc crystalloid  UOP:   100 cc  TT:   None  Drains:   None  Closure:   Staples  Implants:   Biomet cemented Echo system with a #9 laterally offset reduced proximal profile cemented femoral stem, a 44 mm outer diameter shell, and a 28 mm head with a -3 mm neck adapter.  Brief Clinical Note:   The patient is a 78 year old female who sustained above-noted injury yesterday afternoon when she fell while trying to pick up a watermelon in her local grocery store. The patient was brought to the emergency room where x-rays demonstrated the above-noted injury. The patient has been cleared medically and presents at this time for definitive management of the injury.  Procedure:   The patient was brought into the operating room. After an attempt at performing a spinal was unsuccessful, the patient underwent adequate general endotracheal intubation and anesthesia. The patient was repositioned in the right lateral decubitus position and secured using a lateral hip positioner. The bony prominences along the lower part of the leg were properly padded and an axillary roll was utilized. The left hip and lower extremity were prepped with ChloroPrep solution before being draped sterilely. Preoperative antibiotics were administered. A timeout was performed to verify the appropriate surgical site.    A standard posterior approach to the hip was made through an approximately 4-5 inch incision. The incision was carried down through the subcutaneous tissues to expose the gluteal fascia and proximal end of the iliotibial band. These structures  were split the length of the incision and the Charnley self-retaining hip retractor placed. The bursal tissues were swept posteriorly to expose the short external rotators. The anterior border of the piriformis tendon was identified and this Washington developed down through the capsule to enter the joint. Abundant fracture hematoma was suctioned. A flap of tissue was elevated off the posterior aspect of the femoral neck and greater trochanter and retracted posteriorly. This flap included the piriformis tendon, the short external rotators, and the posterior capsule. The femoral head was removed in its entirety, then taken to the back table where it was measured and found to be optimally replicated by a 44 mm head. The appropriate trial head was inserted and found to demonstrate an excellent suction fit.   Attention was directed to the femoral side. The femoral neck was recut 10-12 mm above the lesser trochanter using an oscillating saw. The piriformis fossa was debrided of soft tissues before the intramedullary canal was accessed through this point using a triple step reamer. The canal was reamed sequentially beginning with a #7 tapered reamer and progressing to a #11 tapered reamer. This provided excellent circumferential chatter. A box osteotome was used to establish version before the canal was broached sequentially beginning with a #7 broach and progressing to a #10 broach. This was left in place and several trial reductions performed using the standard laterally offset neck options as well as the -6 mm and -3 mm neck lengths.   The canal was prepared for cementing by  using a bottle brush to remove any loose pieces of bone before the canal was irrigated thoroughly using the jet lavage system. The distal cement restrictor was placed to the appropriate depth before the canal was packed with a Neo-Synephrine soaked gauze. Meanwhile, the cement was mixed on the back table. When it was ready, the cement was injected  into the femoral canal using the appropriate cement gun and third-generation pressurization technique.  The permanent #9 laterally offset reduced proximal profile cemented femoral stem was impacted into place with care taken to maintain the appropriate version. The femoral component was held in place until the cement hardened. The excess cement was removed using the appropriate curettes.    A repeat trial reduction was performed using the -3 mm neck length. The -3 mm neck length demonstrated excellent stability both in extension and external rotation as well as with flexion to 90 and internal rotation beyond 70. It also was stable in the position of sleep. The 44 mm outer diameter shell with the 28 mm head and -3 mm neck adapter construct was put together on the back table before being impacted onto the stem of the femoral component. The Morse taper locking mechanism was verified using manual distraction before the head was relocated and the hip placed through a range of motion with the findings as described above.  The wound was copiously irrigated with sterile saline solution via the jet lavage system before the peri-incisional and pericapsular tissues were injected with a cocktail of 20 cc of Exparel , 30 cc of 0.5% Sensorcaine , 2 cc of Kenalog 40 (80 mg), and 30 mg of Toradol diluted out to 60 cc with normal saline to help with postoperative analgesia. The posterior flap was reapproximated to the posterior aspect of the greater trochanter using #2 Tycron interrupted sutures placed through drill holes. The iliotibial band was reapproximated using #1 Vicryl interrupted sutures before the gluteal fascia was closed using a running #0 Vicryl suture. The subcutaneous tissues were closed in several layers using 2-0 Vicryl interrupted sutures before the skin was closed using staples. A sterile occlusive dressing was applied to the wound. The patient then was rolled back into the supine position on the hospital  bed before being awakened, extubated, and returned to the recovery room in satisfactory condition after tolerating the procedure well.

## 2023-11-29 DIAGNOSIS — S7292XA Unspecified fracture of left femur, initial encounter for closed fracture: Secondary | ICD-10-CM | POA: Diagnosis not present

## 2023-11-29 LAB — VITAMIN B12: Vitamin B-12: 567 pg/mL (ref 180–914)

## 2023-11-29 LAB — BASIC METABOLIC PANEL WITH GFR
Anion gap: 8 (ref 5–15)
BUN: 12 mg/dL (ref 8–23)
CO2: 22 mmol/L (ref 22–32)
Calcium: 8.5 mg/dL — ABNORMAL LOW (ref 8.9–10.3)
Chloride: 96 mmol/L — ABNORMAL LOW (ref 98–111)
Creatinine, Ser: 0.63 mg/dL (ref 0.44–1.00)
GFR, Estimated: >60 mL/min (ref >60)
Glucose, Bld: 133 mg/dL — ABNORMAL HIGH (ref 70–99)
Potassium: 4.5 mmol/L (ref 3.5–5.1)
Sodium: 126 mmol/L — ABNORMAL LOW (ref 135–145)

## 2023-11-29 LAB — CBC
HCT: 33.8 % — ABNORMAL LOW (ref 36.0–46.0)
Hemoglobin: 11.6 g/dL — ABNORMAL LOW (ref 12.0–15.0)
MCH: 28.4 pg (ref 26.0–34.0)
MCHC: 34.3 g/dL (ref 30.0–36.0)
MCV: 82.8 fL (ref 80.0–100.0)
Platelets: 199 10*3/uL (ref 150–400)
RBC: 4.08 MIL/uL (ref 3.87–5.11)
RDW: 13.4 % (ref 11.5–15.5)
WBC: 10.3 10*3/uL (ref 4.0–10.5)
nRBC: 0 % (ref 0.0–0.2)

## 2023-11-29 LAB — IRON AND TIBC
Iron: 26 ug/dL — ABNORMAL LOW (ref 28–170)
Saturation Ratios: 11 % (ref 10.4–31.8)
TIBC: 245 ug/dL — ABNORMAL LOW (ref 250–450)
UIBC: 219 ug/dL

## 2023-11-29 LAB — TSH: TSH: 0.93 u[IU]/mL (ref 0.350–4.500)

## 2023-11-29 LAB — FOLATE: Folate: 17.6 ng/mL (ref >5.9)

## 2023-11-29 MED ORDER — DICLOFENAC SODIUM 1 % EX GEL
2.0000 g | Freq: Two times a day (BID) | CUTANEOUS | Status: DC
  Administered 2023-11-29 – 2023-12-02 (×7): 2 g via TOPICAL
  Filled 2023-11-29: qty 100

## 2023-11-29 MED ORDER — GLUCERNA SHAKE PO LIQD
237.0000 mL | Freq: Three times a day (TID) | ORAL | Status: DC
  Administered 2023-11-29 – 2023-12-02 (×3): 237 mL via ORAL

## 2023-11-29 NOTE — TOC Initial Note (Signed)
 Transition of Care [facility]) - Initial/Assessment Note    Patient Details  Name: Mary Washington MRN: [id number] Date of Birth: November 13, 1945  [facility]) CM/SW Contact:    Lauraine JAYSON Carpen, LCSW Phone Number: 11/29/2023, 4:43 PM  Clinical Narrative: CSW met with patient. No family at bedside. CSW introduced role and explained that therapy recommendations would be discussed. Patient is agreeable to SNF placement. [facility] is first preference due to proximity to her home. Admissions coordinator is aware and will review referral in the morning. No further concerns. CSW will continue to follow patient for support and facilitate discharge to SNF once medically stable.                 Expected Discharge Plan: Skilled Nursing Facility Barriers to Discharge: Continued Medical Work up   Patient Goals and CMS Choice            Expected Discharge Plan and Services     Post Acute Care Choice: Skilled Nursing Facility Living arrangements for the past 2 months: Single Family Home                                      Prior Living Arrangements/Services Living arrangements for the past 2 months: Single Family Home Lives with:: Spouse Patient language and need for interpreter reviewed:: Yes Do you feel safe going back to the place where you live?: Yes      Need for Family Participation in Patient Care: Yes (Comment) Care giver support system in place?: Yes (comment)   Criminal Activity/Legal Involvement Pertinent to Current Situation/Hospitalization: No - Comment as needed  Activities of Daily Living   ADL Screening (condition at time of admission) Independently performs ADLs?: Yes (appropriate for developmental age) Is the patient deaf or have difficulty hearing?: No Does the patient have difficulty seeing, even when wearing glasses/contacts?: No Does the patient have difficulty concentrating, remembering, or making decisions?: No  Permission Sought/Granted Permission  sought to share information with : Facility Industrial/product designer granted to share information with : Yes, Verbal Permission Granted     Permission granted to share info w AGENCY: SNF's        Emotional Assessment Appearance:: Appears stated age Attitude/Demeanor/Rapport: Engaged, Gracious Affect (typically observed): Accepting, Appropriate, Calm, Pleasant Orientation: : Oriented to Self, Oriented to Place, Oriented to  Time, Oriented to Situation Alcohol / Substance Use: Not Applicable Psych Involvement: No (comment)  Admission diagnosis:  Hypocalcemia [E83.51] Hypokalemia [E87.6] Closed left femoral fracture (HCC) [S72.92XA] Closed fracture of left hip, initial encounter (HCC) [S72.002A] Fall, initial encounter [W19.XXXA] Patient Active Problem List   Diagnosis Date Noted   Closed left femoral fracture (HCC) 11/27/2023   Hypocalcemia 11/27/2023   Hypokalemia 11/27/2023   Constipation 11/27/2023   Left shoulder pain 11/27/2023   Acute diverticulitis 04/15/2021   Hyponatremia 04/15/2021   Antineoplastic chemotherapy induced pancytopenia (HCC) 04/15/2021   SBO (small bowel obstruction) (HCC) 04/15/2021   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   Generalized lymphadenopathy 11/15/2020   Abdominal lymphadenopathy 09/27/2020   Melanoma (HCC)    PCP:  Lenon Layman ORN, MD Pharmacy:   [id number] [place], [place] - [address] AT [facility] OF [address] [facility] [address] [place] [address] Phone: [phone] Fax: [phone]     Social Drivers of Health (SDOH) Social History: SDOH Screenings  Food Insecurity: No Food Insecurity (11/28/2023)  Housing: Low Risk  (11/28/2023)  Transportation Needs: No Transportation Needs (11/28/2023)  Utilities: Not At Risk (11/28/2023)  Financial Resource Strain: Low Risk  (04/21/2023)   Received from [facility]  Social Connections: Patient  Declined (11/28/2023)  Tobacco Use: Low Risk  (11/28/2023)   SDOH Interventions:     Readmission Risk Interventions    05/01/2021    2:11 PM  Readmission Risk Prevention Plan  PCP or Specialist Appt within 3-5 Days Complete  Social Work Consult for Recovery Care Planning/Counseling Complete  Palliative Care Screening Not Applicable

## 2023-11-29 NOTE — NC FL2 (Signed)
 [place]  MEDICAID FL2 LEVEL OF CARE FORM     IDENTIFICATION  Patient Name: Mary Washington Birthdate: 02-02-1946 Sex: female Admission Date (Current Location): 11/27/2023  [facility] and [place] Number:  Chiropodist and Address:  [facility], [address], [place], [place] [address]      Provider Number: [id number]  Attending Physician Name and Address:  Von Bellis, MD  Relative Name and Phone Number:       Current Level of Care: Hospital Recommended Level of Care: Skilled Nursing Facility Prior Approval Number:    Date Approved/Denied:   PASRR Number: [id number]  Discharge Plan: SNF    Current Diagnoses: Patient Active Problem List   Diagnosis Date Noted   Closed left femoral fracture (HCC) 11/27/2023   Hypocalcemia 11/27/2023   Hypokalemia 11/27/2023   Constipation 11/27/2023   Left shoulder pain 11/27/2023   Acute diverticulitis 04/15/2021   Hyponatremia 04/15/2021   Antineoplastic chemotherapy induced pancytopenia (HCC) 04/15/2021   SBO (small bowel obstruction) (HCC) 04/15/2021   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 Fluid restriction 1500 mL.)  AMBULATORY STATUS COMMUNICATION OF NEEDS Skin   Limited Assist Verbally Bruising, Surgical wounds (Incision on left hip: Honeycomb.)                       Personal Care Assistance Level of Assistance  Bathing, Feeding, Dressing Bathing Assistance: Limited assistance Feeding assistance: Limited assistance Dressing Assistance: Limited assistance     Functional Limitations Info  Sight, Speech,  Hearing Sight Info: Adequate Hearing Info: Adequate Speech Info: Adequate    SPECIAL CARE FACTORS FREQUENCY  PT (By licensed PT), OT (By licensed OT)     PT Frequency: 5 x week OT Frequency: 5 x week            Contractures Contractures Info: Not present    Additional Factors Info  Code Status, Allergies Code Status Info: Full code Allergies Info: NKDA           Current Medications (11/29/2023):  This is the current hospital active medication list Current Facility-Administered Medications  Medication Dose Route Frequency Provider Last Rate Last Admin   acetaminophen  (TYLENOL ) tablet 325-650 mg  325-650 mg Oral Q6H PRN Poggi, John J, MD       bisacodyl (DULCOLAX) suppository 10 mg  10 mg Rectal Daily PRN Poggi, John J, MD       calcium-vitamin D (OSCAL WITH D) 500-5 MG-MCG per tablet 1 tablet  1 tablet Oral Q breakfast Poggi, John J, MD   1 tablet at 11/28/23 2134   cholecalciferol (VITAMIN D3) 25 MCG (1000 UNIT) tablet 2,000 Units  2,000 Units Oral Daily Poggi, John J, MD   2,000 Units at 11/29/23 9092   diclofenac Sodium (VOLTAREN) 1 % topical gel 2 g  2 g Topical q12n4p Von Bellis, MD   2 g at 11/29/23 1627   diphenhydrAMINE  (BENADRYL ) 12.5 MG/5ML elixir 12.5-25 mg  12.5-25 mg Oral Q4H PRN Poggi, John J, MD       docusate sodium (COLACE) capsule 100 mg  100 mg Oral BID Poggi,  John J, MD   100 mg at 11/29/23 9092   enoxaparin  (LOVENOX ) injection 40 mg  40 mg Subcutaneous Q24H Poggi, John J, MD   40 mg at 11/29/23 9091   feeding supplement (GLUCERNA SHAKE) (GLUCERNA SHAKE) liquid 237 mL  237 mL Oral TID BM Von Bellis, MD   237 mL at 11/29/23 1626   magnesium  hydroxide (MILK OF MAGNESIA) suspension 30 mL  30 mL Oral Daily PRN Poggi, John J, MD       metoCLOPramide (REGLAN) tablet 5-10 mg  5-10 mg Oral Q8H PRN Poggi, John J, MD       Or   metoCLOPramide (REGLAN) injection 5-10 mg  5-10 mg Intravenous Q8H PRN Poggi, John J, MD       metoprolol  tartrate (LOPRESSOR ) tablet  25 mg  25 mg Oral BID Poggi, John J, MD   25 mg at 11/29/23 9092   ondansetron  (ZOFRAN ) tablet 4 mg  4 mg Oral Q6H PRN Poggi, John J, MD       Or   ondansetron  (ZOFRAN ) injection 4 mg  4 mg Intravenous Q6H PRN Poggi, John J, MD       sodium phosphate  (FLEET) enema 1 enema  1 enema Rectal Once PRN Poggi, John J, MD       Facility-Administered Medications Ordered in Other Encounters  Medication Dose Route Frequency Provider Last Rate Last Admin   sodium chloride  flush (NS) 0.9 % injection 10 mL  10 mL Intravenous PRN Brahmanday, Govinda R, MD   10 mL at 01/07/21 0919     Discharge Medications: Please see discharge summary for a list of discharge medications.  Relevant Imaging Results:  Relevant Lab Results:   Additional Information SS#: [id number]  Lauraine JAYSON Carpen, LCSW

## 2023-11-29 NOTE — Progress Notes (Signed)
 [facility] Progress Note  Patient: Mary Washington    FMW:[id number]  DOA: 11/27/2023     Date of Service: the patient was seen and examined on 11/29/2023  Chief Complaint  Patient presents with   Fall   Brief hospital course: Ms. Shelena Castelluccio is a 78 year old female with history of large cell lymphoma, asthma, hypertension, melanoma, history of bowel obstruction status post colostomy status, GERD, who presents ED for chief concerns of a fall at a grocery store.  Upon arrival to the ED hip x-ray showed displaced left femoral neck fracture.  Patient was subsequently seen by orthopedic surgeon and taking for fracture repair on 11/28/2023.    Assessment and Plan:   # Closed left femoral fracture (HCC) 6/28 S/p left hip bipolar hemiarthroplasty done by Dr Edie Continue postoperative care Continue as needed pain medication Hypocalcemia, most likely due to hypoalbuminemia, Vitamin D level within normal range.  Started Glucerna Pending PT OT eval    Hypokalemia-improved Continue to monitor and replete as needed   Hyponatremia likely secondary to SIADH Patient with sodium of 126 She did receive IV fluid on presentation Currently appears euvolemic Serum osmolarity 259, urine sodium 44, urine osmolarity 631 in keeping with SIADH picture We will continue with fluid restriction 1.5 L/day Monitor sodium level closely   Hypocalcemia-improved Patient presented with calcium 6.3 Calcium today of 8.7 with albumin  of 2.5 therefore corrected calcium within normal limit Monitor closely   Constipation Continue senna docusate p.o. 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 Cycle 6 was discontinued due to complicated sigmoid diverticulitis.   Melanoma (HCC) At left shoulder, status post skin grafting   Left shoulder pain X-ray showed osteopenia but no acute fracture or  dislocation Continue as needed pain medication  Anemia, mild iron deficiency, iron level 26, TSAT 11% Folic acid and B12 within normal range Started oral iron supplement with vitamin C   Body mass index is 24.8 kg/m.  Interventions:  Diet: Regular diet DVT Prophylaxis: Subcutaneous Lovenox    Advance goals of care discussion: Full code  Family Communication: family was present at bedside, at the time of interview.  The pt provided permission to discuss medical plan with the family. Opportunity was given to ask question and all questions were answered satisfactorily.   Disposition:  Pt is from Home, admitted with left femur fracture, status post ORIF.  Medically optimized, cleared to discharge to SNF when bed will be available. Follow TOC for disposition plan  Subjective: No significant events overnight, left hip pain is well-controlled with current medication.  Patient does use Voltaren gel for the knee joint which was reordered on her request.  Denied any other complaints. Patient wanted diet to be changed show started on regular diet  Physical Exam: General: NAD, lying comfortably Appear in no distress, affect appropriate Eyes: PERRLA ENT: Oral Mucosa Clear, moist  Neck: no JVD,  Cardiovascular: S1 and S2 Present, no Murmur,  Respiratory: good respiratory effort, Bilateral Air entry equal and Decreased, no Crackles, no wheezes Abdomen: Bowel Sound present, Soft and no tenderness,  Skin: no rashes Extremities: s/p left hip ORIF, honeycomb dressing intact and dry, no Pedal edema, no calf tenderness Neurologic: without any new focal findings Gait not checked due to patient safety concerns  Vitals:   11/28/23 1709 11/28/23 1943 11/29/23 0453 11/29/23 0732  BP: (!) 117/50 (!) 119/57 (!) 114/50 125/63  Pulse:  83 82 94 84  Resp:  18 18 16   Temp:  98.6 F (37 C) 98.2 F (36.8 C) 97.7 F (36.5 C)  TempSrc:  Oral  Oral  SpO2:  95% 94% 90%  Weight:      Height:         Intake/Output Summary (Last 24 hours) at 11/29/2023 1255 Last data filed at 11/29/2023 0700 Gross per 24 hour  Intake 460 ml  Output 2500 ml  Net -2040 ml   Filed Weights   11/27/23 1549 11/28/23 1322  Weight: 57.6 kg 57.6 kg    Data Reviewed: I have personally reviewed and interpreted daily labs, tele strips, imagings as discussed above. I reviewed all nursing notes, pharmacy notes, vitals, pertinent old records I have discussed plan of care as described above with RN and patient/family.  CBC: Recent Labs  Lab 11/27/23 1559 11/28/23 0539 11/29/23 0442  WBC 7.5 9.9 10.3  NEUTROABS 3.5  --   --   HGB 12.9 13.9 11.6*  HCT 38.8 41.0 33.8*  MCV 87.4 84.2 82.8  PLT 253 233 199   Basic Metabolic Panel: Recent Labs  Lab 11/27/23 1559 11/27/23 1929 11/28/23 0539 11/29/23 0442  NA 138  --  126* 126*  K 2.6* 4.1 4.7 4.5  CL 112*  --  97* 96*  CO2 21*  --  23 22  GLUCOSE 88  --  127* 133*  BUN 16  --  16 12  CREATININE 0.62  --  0.63 0.63  CALCIUM 6.3*  --  8.7* 8.5*  MG 1.7  --  2.1  --   PHOS  --  3.5  --   --     Studies: No results found.  Scheduled Meds:  calcium-vitamin D  1 tablet Oral Q breakfast   cholecalciferol  2,000 Units Oral Daily   diclofenac Sodium  2 g Topical q12n4p   docusate sodium  100 mg Oral BID   enoxaparin  (LOVENOX ) injection  40 mg Subcutaneous Q24H   metoprolol  tartrate  25 mg Oral BID   Continuous Infusions: PRN Meds: acetaminophen , bisacodyl, diphenhydrAMINE , magnesium  hydroxide, metoCLOPramide **OR** metoCLOPramide (REGLAN) injection, ondansetron  **OR** ondansetron  (ZOFRAN ) IV, sodium phosphate   Time spent: 35 minutes  Author: ELVAN SOR. MD [facility] 11/29/2023 12:55 PM  To reach On-call, see care teams to locate the attending and reach out to them via [email or web address]. If 7PM-7AM, please contact night-coverage If you still have difficulty reaching the attending provider, please page the [facility] (Director on Call) for  [facility] on amion for assistance.

## 2023-11-29 NOTE — Evaluation (Signed)
 Occupational Therapy Evaluation Patient Details Name: Mary Washington MRN: [id number] DOB: 09-16-1945 Today's Date: 11/29/2023   History of Present Illness   Pt is a 78 year old female with history of large cell lymphoma, asthma, hypertension, melanoma, history of bowel obstruction status post colostomy status, GERD, who presented to the ED for chief concerns of a fall at a grocery store. Pt is s/p left hip bipolar hemiarthroplasty.     Clinical Impressions Patient presenting with decreased Ind in self care,balance, functional mobility/transfers, endurance, and safety awareness. Patient reports being Ind at baseline for self care and mobility. Pt lives at home with husband.Upon entering the room, pt returning to bed with NT in room. Pt declines attempts to transfer or mobility further this session as she just left [facility]. Pt needing mod A for sit >supine. Pt was able to accurately verbalize posterior precautions. OT provided further education on use of AE to maintain precautions for self care tasks. Pt remains in bed and placed in side lying position with call bell and all needed items within reach upon exiting the room.  Patient will benefit from acute OT to increase overall independence in the areas of ADLs, functional mobility, and safety awareness in order to safely discharge.     If plan is discharge home, recommend the following:   A little help with walking and/or transfers;A little help with bathing/dressing/bathroom;Assistance with cooking/housework;Assist for transportation;Help with stairs or ramp for entrance     Functional Status Assessment   Patient has had a recent decline in their functional status and demonstrates the ability to make significant improvements in function in a reasonable and predictable amount of time.     Equipment Recommendations   Other (comment) (defer to next venue of care)      Precautions/Restrictions   Precautions Precautions: Posterior  Hip Recall of Precautions/Restrictions: Intact Restrictions Weight Bearing Restrictions Per Provider Order: Yes LLE Weight Bearing Per Provider Order: Weight bearing as tolerated     Mobility Bed Mobility Overal bed mobility: Needs Assistance Bed Mobility: Sit to Supine       Sit to supine: Mod assist        Transfers Overall transfer level: Needs assistance Equipment used: Rolling walker (2 wheels) Transfers: Sit to/from Stand Sit to Stand: Min assist                  Balance Overall balance assessment: Needs assistance Sitting-balance support: Feet supported, No upper extremity supported Sitting balance-Leahy Scale: Good     Standing balance support: Bilateral upper extremity supported, During functional activity, Reliant on assistive device for balance Standing balance-Leahy Scale: Fair                             ADL either performed or assessed with clinical judgement   ADL Overall ADL's : Needs assistance/impaired                                       General ADL Comments: min A simulated toilet transfer. OT anticipates pt needing mod A for LB self care.     Vision Patient Visual Report: No change from baseline              Pertinent Vitals/Pain Pain Assessment Pain Assessment: No/denies pain     Extremity/Trunk Assessment Upper Extremity Assessment Upper Extremity Assessment: Generalized weakness   Lower Extremity Assessment  Lower Extremity Assessment: Generalized weakness       Communication Communication Communication: No apparent difficulties   Cognition Arousal: Alert Behavior During Therapy: WFL for tasks assessed/performed Cognition: No apparent impairments                               Following commands: Intact       Cueing  General Comments   Cueing Techniques: Verbal cues;Gestural cues;Visual cues              Home Living Family/patient expects to be discharged to::  Skilled nursing facility Living Arrangements: Spouse/significant other Available Help at Discharge: Family Type of Home: House Home Access: Stairs to enter [employer] of Steps: 1-2   Home Layout: One level     Bathroom Shower/Tub: Walk-in shower         Home Equipment: Agricultural consultant (2 wheels);Cane - single point;Shower seat          Prior Functioning/Environment Prior Level of Function : Independent/Modified Independent             Mobility Comments: Pt reported being independent with community distances with the use of a rollator. Pt reported 1 fall in the past 6 months, which was her recent fall that led to L THA. ADLs Comments: Pt reports being Ind with self care and IADLs    OT Problem List: Decreased strength;Decreased safety awareness;Decreased activity tolerance;Decreased knowledge of use of DME or AE;Impaired balance (sitting and/or standing);Decreased knowledge of precautions;Cardiopulmonary status limiting activity   OT Treatment/Interventions: Self-care/ADL training;Therapeutic activities;Therapeutic exercise;Energy conservation;Patient/family education;Balance training;DME and/or AE instruction      OT Goals(Current goals can be found in the care plan section)   Acute Rehab OT Goals Patient Stated Goal: to increase independence OT Goal Formulation: With patient Time For Goal Achievement: 12/13/23 Potential to Achieve Goals: Fair ADL Goals Pt Will Perform Grooming: with modified independence;standing Pt Will Perform Lower Body Dressing: with supervision;sit to/from stand;with adaptive equipment Pt Will Transfer to Toilet: with supervision;ambulating Pt Will Perform Toileting - Clothing Manipulation and hygiene: with supervision;sit to/from stand   OT Frequency:  Min 2X/week       AM-PAC OT 6 Clicks Daily Activity     Outcome Measure Help from another person eating meals?: None Help from another person taking care of personal  grooming?: None Help from another person toileting, which includes using toliet, bedpan, or urinal?: A Little Help from another person bathing (including washing, rinsing, drying)?: A Little Help from another person to put on and taking off regular upper body clothing?: A Little Help from another person to put on and taking off regular lower body clothing?: A Lot 6 Click Score: 19   End of Session Equipment Utilized During Treatment: Rolling walker (2 wheels) Nurse Communication: Mobility status  Activity Tolerance: Patient tolerated treatment well;Patient limited by fatigue Patient left: in bed;with call bell/phone within reach;with bed alarm set  OT Visit Diagnosis: Unsteadiness on feet (R26.81);Repeated falls (R29.6);Muscle weakness (generalized) (M62.81)                Time: 8842-8777 OT Time Calculation (min): 25 min Charges:  OT General Charges $OT Visit: 1 Visit OT Evaluation $OT Eval Low Complexity: 1 Low OT Treatments $Self Care/Home Management : 8-22 mins  Izetta Claude, MS, OTR/L , CBIS ascom [phone]  11/29/23, 3:11 PM

## 2023-11-29 NOTE — Evaluation (Signed)
 Physical Therapy Evaluation Patient Details Name: Mary Washington MRN: [id number] DOB: Sep 28, 1945 Today's Date: 11/29/2023  History of Present Illness  Pt is a 78 year old female with history of large cell lymphoma, asthma, hypertension, melanoma, history of bowel obstruction status post colostomy status, GERD, who presented to the ED for chief concerns of a fall at a grocery store. Pt is s/p left hip bipolar hemiarthroplasty.  Clinical Impression  Pt was pleasant and motivated to participate during the session and put forth good effort throughout. Pt was able to complete supine therex per below and was prescribed therex completed as a part of HEP. Pt was educated on posterior hip precautions during transfers and mobility. Pt required minA for bed mobility and CGA for ambulation with RW. Ambulation distance was set to pt's reported tolerance. Pt reported no adverse symptoms other than L hip pain during the session with SpO2 and HR WNL throughout on room air. Pt will benefit from continued PT services upon discharge to safely address deficits listed in patient problem list for decreased caregiver assistance and eventual return to PLOF.        If plan is discharge home, recommend the following: A little help with walking and/or transfers;A lot of help with bathing/dressing/bathroom;Assistance with cooking/housework;Assist for transportation;Help with stairs or ramp for entrance   Can travel by private vehicle   Yes    Equipment Recommendations Other (comment) (TBD at next venue of care)  Recommendations for Other Services       Functional Status Assessment Patient has had a recent decline in their functional status and demonstrates the ability to make significant improvements in function in a reasonable and predictable amount of time.     Precautions / Restrictions Precautions Precautions: Posterior Hip Precaution Booklet Issued: Yes (comment) (Included with HEP) Recall of  Precautions/Restrictions: Intact Precaution/Restrictions Comments: Restrictions Weight Bearing Restrictions Per Provider Order: Yes LLE Weight Bearing Per Provider Order: Weight bearing as tolerated      Mobility  Bed Mobility Overal bed mobility: Needs Assistance Bed Mobility: Supine to Sit     Supine to sit: Min assist, Used rails     General bed mobility comments: Pt required minA to swing legs off EOB and pt used bed rails to assist with pulling trunk forward    Transfers Overall transfer level: Needs assistance Equipment used: Rolling walker (2 wheels) Transfers: Sit to/from Stand Sit to Stand: Contact guard assist, From elevated surface           General transfer comment: Pt required CGA and EOB to be elevated to complete STS with RW. Pt remained steady throughout and no LOBs occured. Multimodal cuing provided for posterior hip precaution compliance    Ambulation/Gait Ambulation/Gait assistance: Contact guard assist Gait Distance (Feet): 6 Feet Assistive device: Rolling walker (2 wheels) Gait Pattern/deviations: Step-through pattern, Decreased stance time - left, Decreased weight shift to left, Trunk flexed, Knee hyperextension - right, Knee hyperextension - left Gait velocity: decreased     General Gait Details: Pt demonstrated flexed trunk over RW during ambulation and decreased weight shift onto LLE. Pt was only able to ambulate 6 feet due to fatigue, but remained steady throughout and no LOBs occured. 90 degree left turn training x 4 to prevent L hip CKC IR.   Stairs            Wheelchair Mobility     Tilt Bed    Modified Rankin (Stroke Patients Only)       Balance Overall balance assessment: Needs assistance  Sitting-balance support: Feet supported, No upper extremity supported Sitting balance-Leahy Scale: Good     Standing balance support: Bilateral upper extremity supported, During functional activity, Reliant on assistive device for  balance Standing balance-Leahy Scale: Fair Standing balance comment: Pt reliant on RW to maintain balance in stance and ambulation, but remained steady throughout and no LOBs occured                             Pertinent Vitals/Pain Pain Assessment Pain Assessment: No/denies pain    Home Living                          Prior Function                       Extremity/Trunk Assessment   Upper Extremity Assessment Upper Extremity Assessment: Overall WFL for tasks assessed    Lower Extremity Assessment Lower Extremity Assessment: Generalized weakness       Communication   Communication Communication: No apparent difficulties    Cognition Arousal: Alert Behavior During Therapy: WFL for tasks assessed/performed   PT - Cognitive impairments: No apparent impairments                         Following commands: Intact       Cueing Cueing Techniques: Verbal cues, Gestural cues, Visual cues     General Comments      Exercises Total Joint Exercises Ankle Circles/Pumps: AROM, Left, 10 reps Quad Sets: AROM, Left, 10 reps Gluteal Sets: AROM, Left, 10 reps General review of HEP per handout   Assessment/Plan    PT Assessment Patient needs continued PT services  PT Problem List Decreased strength;Decreased mobility;Decreased range of motion;Decreased activity tolerance;Decreased balance;Pain       PT Treatment Interventions DME instruction;Therapeutic exercise;Gait training;Balance training;Stair training;Functional mobility training;Therapeutic activities;Patient/family education    PT Goals (Current goals can be found in the Care Plan section)  Acute Rehab PT Goals Patient Stated Goal: to get strong to be able to go on vacation later this year PT Goal Formulation: With patient Time For Goal Achievement: 12/12/23 Potential to Achieve Goals: Good    Frequency BID     Co-evaluation               AM-PAC PT 6 Clicks  Mobility  Outcome Measure Help needed turning from your back to your side while in a flat bed without using bedrails?: A Little Help needed moving from lying on your back to sitting on the side of a flat bed without using bedrails?: A Little Help needed moving to and from a bed to a chair (including a wheelchair)?: A Little Help needed standing up from a chair using your arms (e.g., wheelchair or bedside chair)?: A Little Help needed to walk in hospital room?: A Little Help needed climbing 3-5 steps with a railing? : A Lot 6 Click Score: 17    End of Session Equipment Utilized During Treatment: Gait belt Activity Tolerance: Patient tolerated treatment well Patient left: in chair;with chair alarm set;with call bell/phone within reach;with family/visitor present Nurse Communication: Mobility status PT Visit Diagnosis: Unsteadiness on feet (R26.81);Other abnormalities of gait and mobility (R26.89);Pain Pain - Right/Left: Left Pain - part of body: Hip    Time: 9048-8967 PT Time Calculation (min) (ACUTE ONLY): 41 min   Charges:  [employer], SPT 11/29/23, 1:20 PM

## 2023-11-29 NOTE — Progress Notes (Signed)
 Subjective: 1 Day Post-Op Procedure(s) (LRB): HEMIARTHROPLASTY (BIPOLAR) HIP, POSTERIOR APPROACH FOR FRACTURE (Left) Patient reports pain as mild.   Patient is well, and has had no acute complaints or problems PT and care management to assist with discharge planning. Negative for chest pain and shortness of breath Fever: no Gastrointestinal:Negative for nausea and vomiting this AM.  Objective: Vital signs in last 24 hours: Temp:  [97.4 F (36.3 C)-98.6 F (37 C)] 97.7 F (36.5 C) (06/29 0732) Pulse Rate:  [75-95] 84 (06/29 0732) Resp:  [14-24] 16 (06/29 0732) BP: (114-131)/(50-70) 125/63 (06/29 0732) SpO2:  [90 %-100 %] 90 % (06/29 0732) Weight:  [57.6 kg] 57.6 kg (06/28 1322)  Intake/Output from previous day:  Intake/Output Summary (Last 24 hours) at 11/29/2023 0736 Last data filed at 11/29/2023 0615 Gross per 24 hour  Intake 1860 ml  Output 2275 ml  Net -415 ml    Intake/Output this shift: No intake/output data recorded.  Labs: Recent Labs    11/27/23 1559 11/28/23 0539 11/29/23 0442  HGB 12.9 13.9 11.6*   Recent Labs    11/28/23 0539 11/29/23 0442  WBC 9.9 10.3  RBC 4.87 4.08  HCT 41.0 33.8*  PLT 233 199   Recent Labs    11/28/23 0539 11/29/23 0442  NA 126* 126*  K 4.7 4.5  CL 97* 96*  CO2 23 22  BUN 16 12  CREATININE 0.63 0.63  GLUCOSE 127* 133*  CALCIUM 8.7* 8.5*   No results for input(s): LABPT, INR in the last 72 hours.   EXAM General - Patient is Alert, Appropriate, and Oriented Extremity - ABD soft Neurovascular intact Dorsiflexion/Plantar flexion intact Incision: dressing C/D/I No cellulitis present Compartment soft Dressing/Incision - clean, dry, no drainage noted to the left hip honeycomb dressing. Motor Function - intact, moving foot and toes well on exam.  Abdomen soft with intact bowel sounds.  Past Medical History:  Diagnosis Date   Anemia    Arthritis    Colon polyps    Colostomy in place 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)    Melanoma (HCC) 1988   lt upper arm, some lymph nodes removed   Mild intermittent asthma    well controlled   Osteoporosis    Pneumonia 2022   Tachycardia     Assessment/Plan: 1 Day Post-Op Procedure(s) (LRB): HEMIARTHROPLASTY (BIPOLAR) HIP, POSTERIOR APPROACH FOR FRACTURE (Left) Principal Problem:   Closed left femoral fracture (HCC) Active Problems:   Melanoma (HCC)   Generalized lymphadenopathy   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   Weight as of this encounter: 57.6 kg. Advance diet Up with therapy D/C IV fluids when tolerating po intake.  Labs and vitals reviewed, Hg 11.6, WBC 10.3. Up with therapy.  Patient lives with husband in one story home, one step to get up. May need SNF vs. HHPT depending  on progress with PT. Continue to work on a BM.  DVT Prophylaxis - Lovenox  and TED hose Weight-Bearing as tolerated to left leg  J. Gustavo Level, PA-C [facility] Orthopaedic Surgery 11/29/2023, 7:36 AM

## 2023-11-30 ENCOUNTER — Encounter: Payer: Self-pay | Admitting: Surgery

## 2023-11-30 DIAGNOSIS — S7292XA Unspecified fracture of left femur, initial encounter for closed fracture: Secondary | ICD-10-CM | POA: Diagnosis not present

## 2023-11-30 LAB — BASIC METABOLIC PANEL WITH GFR
Anion gap: 11 (ref 5–15)
BUN: 16 mg/dL (ref 8–23)
CO2: 22 mmol/L (ref 22–32)
Calcium: 8.6 mg/dL — ABNORMAL LOW (ref 8.9–10.3)
Chloride: 100 mmol/L (ref 98–111)
Creatinine, Ser: 0.52 mg/dL (ref 0.44–1.00)
GFR, Estimated: >60 mL/min (ref >60)
Glucose, Bld: 118 mg/dL — ABNORMAL HIGH (ref 70–99)
Potassium: 4 mmol/L (ref 3.5–5.1)
Sodium: 133 mmol/L — ABNORMAL LOW (ref 135–145)

## 2023-11-30 LAB — CBC
HCT: 34.8 % — ABNORMAL LOW (ref 36.0–46.0)
Hemoglobin: 12 g/dL (ref 12.0–15.0)
MCH: 28.8 pg (ref 26.0–34.0)
MCHC: 34.5 g/dL (ref 30.0–36.0)
MCV: 83.5 fL (ref 80.0–100.0)
Platelets: 229 10*3/uL (ref 150–400)
RBC: 4.17 MIL/uL (ref 3.87–5.11)
RDW: 13.9 % (ref 11.5–15.5)
WBC: 10 10*3/uL (ref 4.0–10.5)
nRBC: 0 % (ref 0.0–0.2)

## 2023-11-30 LAB — MAGNESIUM: Magnesium: 2 mg/dL (ref 1.7–2.4)

## 2023-11-30 LAB — PHOSPHORUS: Phosphorus: 2.7 mg/dL (ref 2.5–4.6)

## 2023-11-30 LAB — CALCIUM, IONIZED: Calcium, Ionized, Serum: 4.8 mg/dL (ref 4.5–5.6)

## 2023-11-30 MED ORDER — BISACODYL 5 MG PO TBEC
10.0000 mg | DELAYED_RELEASE_TABLET | Freq: Once | ORAL | Status: AC
  Administered 2023-11-30: 10 mg via ORAL
  Filled 2023-11-30: qty 2

## 2023-11-30 MED ORDER — POLYETHYLENE GLYCOL 3350 17 G PO PACK
17.0000 g | PACK | Freq: Two times a day (BID) | ORAL | Status: DC
  Administered 2023-11-30 (×2): 17 g via ORAL
  Filled 2023-11-30 (×3): qty 1

## 2023-11-30 MED ORDER — VITAMIN C 500 MG PO TABS
500.0000 mg | ORAL_TABLET | Freq: Every day | ORAL | Status: DC
  Administered 2023-11-30 – 2023-12-02 (×3): 500 mg via ORAL
  Filled 2023-11-30 (×3): qty 1

## 2023-11-30 MED ORDER — BISACODYL 10 MG RE SUPP: 10.0000 mg | Freq: Every day | RECTAL | Status: DC | PRN

## 2023-11-30 MED ORDER — POLYSACCHARIDE IRON COMPLEX 150 MG PO CAPS
150.0000 mg | ORAL_CAPSULE | Freq: Every day | ORAL | Status: DC
  Administered 2023-11-30 – 2023-12-02 (×3): 150 mg via ORAL
  Filled 2023-11-30 (×3): qty 1

## 2023-11-30 MED ORDER — LACTULOSE 10 GM/15ML PO SOLN
10.0000 g | Freq: Every day | ORAL | Status: DC | PRN
  Administered 2023-12-01: 10 g via ORAL
  Filled 2023-11-30: qty 30

## 2023-11-30 MED ORDER — FLEET ENEMA RE ENEM: 1.0000 | ENEMA | Freq: Once | RECTAL | Status: DC | PRN

## 2023-11-30 MED ORDER — BISACODYL 5 MG PO TBEC
10.0000 mg | DELAYED_RELEASE_TABLET | Freq: Every day | ORAL | Status: DC
  Administered 2023-11-30: 10 mg via ORAL
  Filled 2023-11-30: qty 2

## 2023-11-30 NOTE — Care Management Important Message (Signed)
 Important Message  Patient Details  Name: Mary Washington MRN: [id number] Date of Birth: Jul 23, 1945   Important Message Given:  Yes - Medicare IM     Colbin Jovel W, CMA 11/30/2023, 11:03 AM

## 2023-11-30 NOTE — TOC Progression Note (Signed)
 Transition of Care [facility]) - Progression Note    Patient Details  Name: Mary Washington MRN: [id number] Date of Birth: [birth year]  [facility]) CM/SW Contact  Quintella Suzen Jansky, RN Phone Number: 11/30/2023, 1:22 PM  Clinical Narrative:     Patient has pending auth number for [facility], Pending JluyPI:[id number]   Expected Discharge Plan: Skilled Nursing Facility Barriers to Discharge: Continued Medical Work up  Expected Discharge Plan and Services     Post Acute Care Choice: Skilled Nursing Facility Living arrangements for the past 2 months: Single Family Home                                       Social Determinants of Health (SDOH) Interventions SDOH Screenings   Food Insecurity: No Food Insecurity (11/28/2023)  Housing: Low Risk  (11/28/2023)  Transportation Needs: No Transportation Needs (11/28/2023)  Utilities: Not At Risk (11/28/2023)  Financial Resource Strain: Low Risk  (04/21/2023)   Received from [facility]  Social Connections: Patient Declined (11/28/2023)  Tobacco Use: Low Risk  (11/28/2023)    Readmission Risk Interventions    05/01/2021    2:11 PM  Readmission Risk Prevention Plan  PCP or Specialist Appt within 3-5 Days Complete  Social Work Consult for Recovery Care Planning/Counseling Complete  Palliative Care Screening Not Applicable

## 2023-11-30 NOTE — TOC Progression Note (Signed)
 Transition of Care [facility]) - Progression Note    Patient Details  Name: Mary Washington MRN: [id number] Date of Birth: 23-Jun-1945  Transition of Care [facility]) CM/SW Contact  Quintella Suzen Jansky, RN Phone Number: 11/30/2023, 2:15 PM  Clinical Narrative:     Patient received shara approval for [facility], Approved JluyPI:[id number] Dates: 6/30-12/02/2023 Next Review Date: 7.2.2025. Notified Alfonso at [facility] regarding approval, inquired if patient can come to facility tomorrow.   Expected Discharge Plan: Skilled Nursing Facility Barriers to Discharge: Continued Medical Work up  Expected Discharge Plan and Services     Post Acute Care Choice: Skilled Nursing Facility Living arrangements for the past 2 months: Single Family Home                                       Social Determinants of Health (SDOH) Interventions SDOH Screenings   Food Insecurity: No Food Insecurity (11/28/2023)  Housing: Low Risk  (11/28/2023)  Transportation Needs: No Transportation Needs (11/28/2023)  Utilities: Not At Risk (11/28/2023)  Financial Resource Strain: Low Risk  (04/21/2023)   Received from [facility]  Social Connections: Patient Declined (11/28/2023)  Tobacco Use: Low Risk  (11/28/2023)    Readmission Risk Interventions    05/01/2021    2:11 PM  Readmission Risk Prevention Plan  PCP or Specialist Appt within 3-5 Days Complete  Social Work Consult for Recovery Care Planning/Counseling Complete  Palliative Care Screening Not Applicable

## 2023-11-30 NOTE — Progress Notes (Signed)
 Occupational Therapy Treatment Patient Details Name: Mary Washington MRN: [id number] DOB: 1945/11/22 Today's Date: 11/30/2023   History of present illness Pt is a 78 year old female with history of large cell lymphoma, asthma, hypertension, melanoma, history of bowel obstruction status post colostomy status, GERD, who presented to the ED for chief concerns of a fall at a grocery store. Pt is s/p left hip bipolar hemiarthroplasty.   OT comments  Pt making progress towards goals, recalls 3/3 posterior hip precautions start of session. Oral care completed recliner level prior to mobility with setup, MIN A using RW to rise from recliner with good carryover of hand placement. Functional mobility in room ~10 ft with RW and CGA, chair follow for safety. Pt left with PT, spouse in room. Discharge recommendation remains appropriate, OT will continue to follow.       If plan is discharge home, recommend the following:  A little help with walking and/or transfers;A little help with bathing/dressing/bathroom;Assistance with cooking/housework;Assist for transportation;Help with stairs or ramp for entrance   Equipment Recommendations  Other (comment)       Precautions / Restrictions Precautions Precautions: Posterior Hip Precaution Booklet Issued: Yes (comment) Recall of Precautions/Restrictions: Intact Precaution/Restrictions Comments: recalls 3/3 hip precautions Restrictions Weight Bearing Restrictions Per Provider Order: Yes LLE Weight Bearing Per Provider Order: Weight bearing as tolerated       Mobility Bed Mobility Overal bed mobility: Needs Assistance             General bed mobility comments: NT, pt in recliner pre start of session, handoff to PT end of session    Transfers Overall transfer level: Needs assistance Equipment used: Rolling walker (2 wheels) Transfers: Sit to/from Stand Sit to Stand: Min assist           General transfer comment: MIN A to rise from recliner,  good hand placement and control     Balance Overall balance assessment: Needs assistance Sitting-balance support: Feet supported, No upper extremity supported Sitting balance-Leahy Scale: Good     Standing balance support: Bilateral upper extremity supported, During functional activity, Reliant on assistive device for balance Standing balance-Leahy Scale: Fair Standing balance comment: Reliant on RW for standing balance, no LOB noted during ambualtion                           ADL either performed or assessed with clinical judgement   ADL Overall ADL's : Needs assistance/impaired     Grooming: Oral care;Sitting;Set up Grooming Details (indicate cue type and reason): recliner level                             Functional mobility during ADLs: Minimal assistance;Contact guard assist;Standard walker General ADL Comments: oral care recliner level, 10 ft mobility in room     Communication Communication Communication: No apparent difficulties   Cognition Arousal: Alert Behavior During Therapy: WFL for tasks assessed/performed Cognition: No apparent impairments                               Following commands: Intact        Cueing   Cueing Techniques: Verbal cues             Pertinent Vitals/ Pain       Pain Assessment Pain Assessment: No/denies pain   Frequency  Min 2X/week  Progress Toward Goals  OT Goals(current goals can now be found in the care plan section)  Progress towards OT goals: Progressing toward goals  Acute Rehab OT Goals OT Goal Formulation: With patient Time For Goal Achievement: 12/13/23 Potential to Achieve Goals: Fair  Plan         AM-PAC OT 6 Clicks Daily Activity     Outcome Measure   Help from another person eating meals?: None Help from another person taking care of personal grooming?: None Help from another person toileting, which includes using toliet, bedpan, or urinal?: A  Little Help from another person bathing (including washing, rinsing, drying)?: A Little Help from another person to put on and taking off regular upper body clothing?: A Little Help from another person to put on and taking off regular lower body clothing?: A Lot 6 Click Score: 19    End of Session Equipment Utilized During Treatment: Rolling walker (2 wheels);Gait belt  OT Visit Diagnosis: Unsteadiness on feet (R26.81);Repeated falls (R29.6);Muscle weakness (generalized) (M62.81)   Activity Tolerance Patient tolerated treatment well   Patient Left in chair;Other (comment);with family/visitor present (handoff to PT in room)   Nurse Communication Mobility status        Time: 9043-8990 OT Time Calculation (min): 13 min  Charges: OT General Charges $OT Visit: 1 Visit OT Treatments $Self Care/Home Management : 8-22 mins  Larie Mathes L. Kyliana Standen, OTR/L  11/30/23, 10:34 AM

## 2023-11-30 NOTE — Plan of Care (Signed)
  Problem: Education: Goal: Knowledge of General Education information will improve Description: Including pain rating scale, medication(s)/side effects and non-pharmacologic comfort measures Outcome: Progressing   Problem: Health Behavior/Discharge Planning: Goal: Ability to manage health-related needs will improve Outcome: Progressing   Problem: Activity: Goal: Risk for activity intolerance will decrease Outcome: Progressing   Problem: Nutrition: Goal: Adequate nutrition will be maintained Outcome: Progressing   Problem: Elimination: Goal: Will not experience complications related to bowel motility Outcome: Progressing   Problem: Pain Managment: Goal: General experience of comfort will improve and/or be controlled Outcome: Progressing

## 2023-11-30 NOTE — Progress Notes (Signed)
 [facility] Progress Note  Patient: Mary Washington    FMW:[id number]  DOA: 11/27/2023     Date of Service: the patient was seen and examined on 11/30/2023  Chief Complaint  Patient presents with   Fall   Brief hospital course: Mary Washington is a 78 year old female with history of large cell lymphoma, asthma, hypertension, melanoma, history of bowel obstruction status post colostomy status, GERD, who presents ED for chief concerns of a fall at a grocery store.  Upon arrival to the ED hip x-ray showed displaced left femoral neck fracture.  Patient was subsequently seen by orthopedic surgeon and taking for fracture repair on 11/28/2023.    Assessment and Plan:   # Closed left femoral fracture (HCC) 6/28 S/p left hip bipolar hemiarthroplasty done by Dr Edie Continue postoperative care Continue as needed pain medication Hypocalcemia, most likely due to hypoalbuminemia, Vitamin D level within normal range.  Started Glucerna PT/OT eval done, recommend SNF placement    Hypokalemia-improved Continue to monitor and replete as needed   Hyponatremia likely secondary to SIADH Na 126--133  She did receive IV fluid on presentation Currently appears euvolemic Serum osmolarity 259, urine sodium 44, urine osmolarity 631 in keeping with SIADH picture We will continue with fluid restriction 1.5 L/day Monitor sodium level closely   Hypocalcemia-improved Patient presented with calcium 6.3 Calcium today of 8.7 with albumin  of 2.5 therefore corrected calcium within normal limit Monitor closely   Constipation Continue senna docusate p.o. 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 Cycle 6 was discontinued due to complicated sigmoid diverticulitis.   Melanoma (HCC) At left shoulder, status post skin grafting   Left shoulder pain X-ray showed osteopenia but no acute fracture or  dislocation Continue as needed pain medication  Anemia, mild iron deficiency, iron level 26, TSAT 11% Folic acid and B12 within normal range Started oral iron supplement with vitamin C   Body mass index is 24.8 kg/m.  Interventions:  Diet: Regular diet DVT Prophylaxis: Subcutaneous Lovenox    Advance goals of care discussion: Full code  Family Communication: family was present at bedside, at the time of interview.  The pt provided permission to discuss medical plan with the family. Opportunity was given to ask question and all questions were answered satisfactorily.   Disposition:  Pt is from Home, admitted with left femur fracture, status post ORIF.  Medically optimized, cleared to discharge to SNF when bed will be available. As per [facility] patient got approved for SNF placement at [facility] is to discharge tomorrow a.m.   Subjective: No significant events overnight, sitting comfortably on the recliner, no pain.  Denies any specific complaints.   Physical Exam: General: NAD, lying comfortably Appear in no distress, affect appropriate Eyes: PERRLA ENT: Oral Mucosa Clear, moist  Neck: no JVD,  Cardiovascular: S1 and S2 Present, no Murmur,  Respiratory: good respiratory effort, Bilateral Air entry equal and Decreased, no Crackles, no wheezes Abdomen: Bowel Sound present, Soft and no tenderness,  Skin: no rashes Extremities: s/p left hip ORIF, honeycomb dressing intact and dry, no Pedal edema, no calf tenderness Neurologic: without any new focal findings Gait not checked due to patient safety concerns  Vitals:   11/29/23 2016 11/30/23 0403 11/30/23 0838 11/30/23 1540  BP: (!) 122/51 138/70 (!) 147/70 125/67  Pulse: 78 76 90 84  Resp: 17 18 18 18   Temp: (!) 97.5  F (36.4 C) 98.5 F (36.9 C) 98.1 F (36.7 C) 97.9 F (36.6 C)  TempSrc: Oral Oral Oral Oral  SpO2: 95% 93% 97% 97%  Weight:      Height:        Intake/Output Summary (Last 24 hours) at 11/30/2023  1716 Last data filed at 11/30/2023 1350 Gross per 24 hour  Intake 880 ml  Output 925 ml  Net -45 ml   Filed Weights   11/27/23 1549 11/28/23 1322  Weight: 57.6 kg 57.6 kg    Data Reviewed: I have personally reviewed and interpreted daily labs, tele strips, imagings as discussed above. I reviewed all nursing notes, pharmacy notes, vitals, pertinent old records I have discussed plan of care as described above with RN and patient/family.  CBC: Recent Labs  Lab 11/27/23 1559 11/28/23 0539 11/29/23 0442 11/30/23 0401  WBC 7.5 9.9 10.3 10.0  NEUTROABS 3.5  --   --   --   HGB 12.9 13.9 11.6* 12.0  HCT 38.8 41.0 33.8* 34.8*  MCV 87.4 84.2 82.8 83.5  PLT 253 233 199 229   Basic Metabolic Panel: Recent Labs  Lab 11/27/23 1559 11/27/23 1929 11/28/23 0539 11/29/23 0442 11/30/23 0401  NA 138  --  126* 126* 133*  K 2.6* 4.1 4.7 4.5 4.0  CL 112*  --  97* 96* 100  CO2 21*  --  23 22 22   GLUCOSE 88  --  127* 133* 118*  BUN 16  --  16 12 16   CREATININE 0.62  --  0.63 0.63 0.52  CALCIUM 6.3*  --  8.7* 8.5* 8.6*  MG 1.7  --  2.1  --  2.0  PHOS  --  3.5  --   --  2.7    Studies: No results found.  Scheduled Meds:  vitamin C  500 mg Oral Daily   bisacodyl  10 mg Oral QHS   calcium-vitamin D  1 tablet Oral Q breakfast   cholecalciferol  2,000 Units Oral Daily   diclofenac Sodium  2 g Topical q12n4p   docusate sodium  100 mg Oral BID   enoxaparin  (LOVENOX ) injection  40 mg Subcutaneous Q24H   feeding supplement (GLUCERNA SHAKE)  237 mL Oral TID BM   iron polysaccharides  150 mg Oral Daily   metoprolol  tartrate  25 mg Oral BID   polyethylene glycol  17 g Oral BID   Continuous Infusions: PRN Meds: acetaminophen , bisacodyl, diphenhydrAMINE , lactulose , magnesium  hydroxide, metoCLOPramide **OR** metoCLOPramide (REGLAN) injection, ondansetron  **OR** ondansetron  (ZOFRAN ) IV, sodium phosphate   Time spent: 35 minutes  Author: ELVAN SOR. MD [facility] 11/30/2023 5:16  PM  To reach On-call, see care teams to locate the attending and reach out to them via [email or web address]. If 7PM-7AM, please contact night-coverage If you still have difficulty reaching the attending provider, please page the [facility] (Director on Call) for [facility] on amion for assistance.

## 2023-11-30 NOTE — Progress Notes (Signed)
 Subjective: 2 Days Post-Op Procedure(s) (LRB): HEMIARTHROPLASTY (BIPOLAR) HIP, POSTERIOR APPROACH FOR FRACTURE (Left) Patient reports pain as mild in the left hip. Patient is well, and has had no acute complaints or problems Current plan is for d/c to SNF pending progress with PT. Negative for chest pain and shortness of breath Fever: no Gastrointestinal:Negative for nausea and vomiting this AM. Reports she is passing gas, no BM yet.  Objective: Vital signs in last 24 hours: Temp:  [97.5 F (36.4 C)-98.5 F (36.9 C)] 98.5 F (36.9 C) (06/30 0403) Pulse Rate:  [76-84] 76 (06/30 0403) Resp:  [16-18] 18 (06/30 0403) BP: (114-138)/(42-70) 138/70 (06/30 0403) SpO2:  [90 %-95 %] 93 % (06/30 0403)  Intake/Output from previous day:  Intake/Output Summary (Last 24 hours) at 11/30/2023 0715 Last data filed at 11/29/2023 2100 Gross per 24 hour  Intake 237 ml  Output 600 ml  Net -363 ml    Intake/Output this shift: No intake/output data recorded.  Labs: Recent Labs    11/27/23 1559 11/28/23 0539 11/29/23 0442 11/30/23 0401  HGB 12.9 13.9 11.6* 12.0   Recent Labs    11/29/23 0442 11/30/23 0401  WBC 10.3 10.0  RBC 4.08 4.17  HCT 33.8* 34.8*  PLT 199 229   Recent Labs    11/29/23 0442 11/30/23 0401  NA 126* 133*  K 4.5 4.0  CL 96* 100  CO2 22 22  BUN 12 16  CREATININE 0.63 0.52  GLUCOSE 133* 118*  CALCIUM 8.5* 8.6*   No results for input(s): LABPT, INR in the last 72 hours.   EXAM General - Patient is Alert, Appropriate, and Oriented Extremity - ABD soft Neurovascular intact Dorsiflexion/Plantar flexion intact No cellulitis present Compartment soft Dressing/Incision - Mild bloody drainage noted to the left hip honeycomb dressing. Motor Function - intact, moving foot and toes well on exam.  Abdomen soft with intact bowel sounds.  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)    Melanoma (HCC) 1988   lt upper arm, some lymph nodes removed   Mild intermittent asthma    well controlled   Osteoporosis    Pneumonia 2022   Tachycardia     Assessment/Plan: 2 Days Post-Op Procedure(s) (LRB): HEMIARTHROPLASTY (BIPOLAR) HIP, POSTERIOR APPROACH FOR FRACTURE (Left) Principal Problem:   Closed left femoral fracture (HCC) Active Problems:   Melanoma (HCC)   Generalized lymphadenopathy   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   Weight as of this encounter: 57.6 kg. Advance diet Up with therapy D/C IV fluids when tolerating po intake.  Labs and vitals reviewed, Hg 12.0, WBC 10.0.  Hyponatremia improving. Up with therapy.  Patient lives with husband in one story home, one step to get up.  Current plan is for d/c to SNF. Continue to work on a BM.  Lactulose  added.  DVT Prophylaxis - Lovenox  and TED hose Weight-Bearing as tolerated to left leg  J. Gustavo Level, PA-C [facility] Orthopaedic Surgery 11/30/2023, 7:15 AM

## 2023-11-30 NOTE — Progress Notes (Signed)
 Physical Therapy Treatment Patient Details Name: Mary Washington MRN: [id number] DOB: 07-25-1945 Today's Date: 11/30/2023   History of Present Illness Pt is a 78 year old female with history of large cell lymphoma, asthma, hypertension, melanoma, history of bowel obstruction status post colostomy status, GERD, who presented to the ED for chief concerns of a fall at a grocery store. Pt is s/p left hip bipolar hemiarthroplasty.    PT Comments  Patient alert, agreeable to PT, OT at bedside. Pt able to progress ambulation today, though still fatigued with activity. CGA-minA with RW, and close chair follow. Pt able to perform a few seated exercises at end of session. The patient would benefit from further skilled PT intervention to continue to progress towards goals.     If plan is discharge home, recommend the following: A little help with walking and/or transfers;A lot of help with bathing/dressing/bathroom;Assistance with cooking/housework;Assist for transportation;Help with stairs or ramp for entrance   Can travel by private vehicle     Yes  Equipment Recommendations  Other (comment) (TBD)    Recommendations for Other Services       Precautions / Restrictions Precautions Precautions: Posterior Hip Precaution Booklet Issued: Yes (comment) Recall of Precautions/Restrictions: Intact Precaution/Restrictions Comments: recalls 3/3 hip precautions Restrictions Weight Bearing Restrictions Per Provider Order: Yes LLE Weight Bearing Per Provider Order: Weight bearing as tolerated     Mobility  Bed Mobility               General bed mobility comments: NT, pt in recliner start/end of session    Transfers Overall transfer level: Needs assistance Equipment used: Rolling walker (2 wheels) Transfers: Sit to/from Stand Sit to Stand: Contact guard assist, Min assist           General transfer comment: CGA for first attempt, minA second    Ambulation/Gait Ambulation/Gait assistance:  Contact guard assist Gait Distance (Feet):  (65ft, 66ft) Assistive device: Rolling walker (2 wheels)   Gait velocity: decreased     General Gait Details: flexed trunk, step to pattern. able to minimally correct with visual/verbal cues   Stairs             Wheelchair Mobility     Tilt Bed    Modified Rankin (Stroke Patients Only)       Balance Overall balance assessment: Needs assistance Sitting-balance support: Feet supported, No upper extremity supported Sitting balance-Leahy Scale: Good     Standing balance support: Bilateral upper extremity supported, During functional activity, Reliant on assistive device for balance Standing balance-Leahy Scale: Fair                              Hotel manager: No apparent difficulties  Cognition Arousal: Alert Behavior During Therapy: WFL for tasks assessed/performed   PT - Cognitive impairments: No apparent impairments                         Following commands: Intact      Cueing Cueing Techniques: Verbal cues, Visual cues  Exercises Other Exercises Other Exercises: seated marching and LAQ x10 bilaterally    General Comments        Pertinent Vitals/Pain Pain Assessment Pain Assessment: 0-10 Pain Score: 2  Pain Location: L hip Pain Descriptors / Indicators: Sore Pain Intervention(s): Limited activity within patient's tolerance, Monitored during session, Repositioned    Home Living  Prior Function            PT Goals (current goals can now be found in the care plan section) Progress towards PT goals: Progressing toward goals    Frequency    BID      PT Plan      Co-evaluation              AM-PAC PT 6 Clicks Mobility   Outcome Measure  Help needed turning from your back to your side while in a flat bed without using bedrails?: A Little Help needed moving from lying on your back to sitting on the  side of a flat bed without using bedrails?: A Little Help needed moving to and from a bed to a chair (including a wheelchair)?: A Little Help needed standing up from a chair using your arms (e.g., wheelchair or bedside chair)?: A Little Help needed to walk in hospital room?: A Little Help needed climbing 3-5 steps with a railing? : A Lot 6 Click Score: 17    End of Session Equipment Utilized During Treatment: Gait belt Activity Tolerance: Patient tolerated treatment well Patient left: in chair;with chair alarm set;with call bell/phone within reach;with family/visitor present Nurse Communication: Mobility status PT Visit Diagnosis: Unsteadiness on feet (R26.81);Other abnormalities of gait and mobility (R26.89);Pain Pain - Right/Left: Left Pain - part of body: Hip     Time: 8991-8978 PT Time Calculation (min) (ACUTE ONLY): 13 min  Charges:    $Therapeutic Activity: 8-22 mins PT General Charges $$ ACUTE PT VISIT: 1 Visit                     Doyal Shams PT, DPT 1:48 PM,11/30/23

## 2023-11-30 NOTE — Progress Notes (Signed)
 Physical Therapy Treatment Patient Details Name: Mary Washington MRN: [id number] DOB: 03/15/1946 Today's Date: 11/30/2023   History of Present Illness Pt is a 78 year old female with history of large cell lymphoma, asthma, hypertension, melanoma, history of bowel obstruction status post colostomy status, GERD, who presented to the ED for chief concerns of a fall at a grocery store. Pt is s/p left hip bipolar hemiarthroplasty.    PT Comments  Patient alert, up in recliner, had just finished with nursing tech using BSC, agreeable to mobility. Reported 2/10 L hip pain with ambulation. She was able to increase her distance from this AM session, still limited by endurance/activity tolerance but very motivated to progress as able. CGA with RW for all tasks this PM with close chair follow. The patient would benefit from further skilled PT intervention to continue to progress towards goals.    If plan is discharge home, recommend the following: A little help with walking and/or transfers;A lot of help with bathing/dressing/bathroom;Assistance with cooking/housework;Assist for transportation;Help with stairs or ramp for entrance   Can travel by private vehicle     Yes  Equipment Recommendations  Other (comment) (TBD)    Recommendations for Other Services       Precautions / Restrictions Precautions Precautions: Posterior Hip Precaution Booklet Issued: Yes (comment) Recall of Precautions/Restrictions: Intact Precaution/Restrictions Comments: recalls 3/3 hip precautions Restrictions Weight Bearing Restrictions Per Provider Order: Yes LLE Weight Bearing Per Provider Order: Weight bearing as tolerated     Mobility  Bed Mobility               General bed mobility comments: NT, pt in recliner start/end of session    Transfers Overall transfer level: Needs assistance Equipment used: Rolling walker (2 wheels) Transfers: Sit to/from Stand Sit to Stand: Contact guard assist            General transfer comment: CGA for first attempt, minA second    Ambulation/Gait Ambulation/Gait assistance: Contact guard assist Gait Distance (Feet):  (65ft, 97ft) Assistive device: Rolling walker (2 wheels)   Gait velocity: decreased     General Gait Details: flexed trunk, more step through gait pattern noted but still limited   Stairs             Wheelchair Mobility     Tilt Bed    Modified Rankin (Stroke Patients Only)       Balance Overall balance assessment: Needs assistance Sitting-balance support: Feet supported, No upper extremity supported Sitting balance-Leahy Scale: Good     Standing balance support: Bilateral upper extremity supported, During functional activity, Reliant on assistive device for balance Standing balance-Leahy Scale: Fair                              Hotel manager: No apparent difficulties  Cognition Arousal: Alert Behavior During Therapy: WFL for tasks assessed/performed   PT - Cognitive impairments: No apparent impairments                         Following commands: Intact      Cueing Cueing Techniques: Verbal cues, Visual cues  Exercises Other Exercises Other Exercises: seated hip abduction AAROM x10 L, seated SAQ AROM x10    General Comments        Pertinent Vitals/Pain Pain Assessment Pain Assessment: 0-10 Pain Score: 2  Pain Location: L hip Pain Descriptors / Indicators: Sore Pain Intervention(s): Limited activity within patient's tolerance,  Monitored during session, Repositioned    Home Living                          Prior Function            PT Goals (current goals can now be found in the care plan section) Progress towards PT goals: Progressing toward goals    Frequency    BID      PT Plan      Co-evaluation              AM-PAC PT 6 Clicks Mobility   Outcome Measure  Help needed turning from your back to your side  while in a flat bed without using bedrails?: A Little Help needed moving from lying on your back to sitting on the side of a flat bed without using bedrails?: A Little Help needed moving to and from a bed to a chair (including a wheelchair)?: A Little Help needed standing up from a chair using your arms (e.g., wheelchair or bedside chair)?: A Little Help needed to walk in hospital room?: A Little Help needed climbing 3-5 steps with a railing? : A Lot 6 Click Score: 17    End of Session Equipment Utilized During Treatment: Gait belt Activity Tolerance: Patient tolerated treatment well Patient left: in chair;with chair alarm set;with call bell/phone within reach Nurse Communication: Mobility status PT Visit Diagnosis: Unsteadiness on feet (R26.81);Other abnormalities of gait and mobility (R26.89);Pain Pain - Right/Left: Left Pain - part of body: Hip     Time: 8641-8581 PT Time Calculation (min) (ACUTE ONLY): 20 min  Charges:    $Therapeutic Exercise: 8-22 mins $Therapeutic Activity: 8-22 mins PT General Charges $$ ACUTE PT VISIT: 1 Visit                    Doyal Shams PT, DPT 3:28 PM,11/30/23

## 2023-11-30 NOTE — TOC Progression Note (Signed)
 Transition of Care [facility]) - Progression Note    Patient Details  Name: Mary Washington MRN: [id number] Date of Birth: 29-Jan-1946  [facility]) CM/SW Contact  Quintella Suzen Jansky, RN Phone Number: 11/30/2023, 1:10 PM  Clinical Narrative:     Spoke with patient regarding bed offers, she chose [facility]. Will need to initiate auth.   Expected Discharge Plan: Skilled Nursing Facility Barriers to Discharge: Continued Medical Work up  Expected Discharge Plan and Services     Post Acute Care Choice: Skilled Nursing Facility Living arrangements for the past 2 months: Single Family Home                                       Social Determinants of Health (SDOH) Interventions SDOH Screenings   Food Insecurity: No Food Insecurity (11/28/2023)  Housing: Low Risk  (11/28/2023)  Transportation Needs: No Transportation Needs (11/28/2023)  Utilities: Not At Risk (11/28/2023)  Financial Resource Strain: Low Risk  (04/21/2023)   Received from [facility]  Social Connections: Patient Declined (11/28/2023)  Tobacco Use: Low Risk  (11/28/2023)    Readmission Risk Interventions    05/01/2021    2:11 PM  Readmission Risk Prevention Plan  PCP or Specialist Appt within 3-5 Days Complete  Social Work Consult for Recovery Care Planning/Counseling Complete  Palliative Care Screening Not Applicable

## 2023-12-01 DIAGNOSIS — S72032A Displaced midcervical fracture of left femur, initial encounter for closed fracture: Secondary | ICD-10-CM | POA: Insufficient documentation

## 2023-12-01 DIAGNOSIS — Z96642 Presence of left artificial hip joint: Secondary | ICD-10-CM | POA: Insufficient documentation

## 2023-12-01 DIAGNOSIS — S7292XA Unspecified fracture of left femur, initial encounter for closed fracture: Secondary | ICD-10-CM | POA: Diagnosis not present

## 2023-12-01 LAB — BASIC METABOLIC PANEL WITH GFR
Anion gap: 11 (ref 5–15)
BUN: 14 mg/dL (ref 8–23)
CO2: 23 mmol/L (ref 22–32)
Calcium: 9.3 mg/dL (ref 8.9–10.3)
Chloride: 98 mmol/L (ref 98–111)
Creatinine, Ser: 0.47 mg/dL (ref 0.44–1.00)
GFR, Estimated: >60 mL/min (ref >60)
Glucose, Bld: 115 mg/dL — ABNORMAL HIGH (ref 70–99)
Potassium: 3.9 mmol/L (ref 3.5–5.1)
Sodium: 132 mmol/L — ABNORMAL LOW (ref 135–145)

## 2023-12-01 LAB — CBC
HCT: 38.9 % (ref 36.0–46.0)
Hemoglobin: 13.3 g/dL (ref 12.0–15.0)
MCH: 28.4 pg (ref 26.0–34.0)
MCHC: 34.2 g/dL (ref 30.0–36.0)
MCV: 83.1 fL (ref 80.0–100.0)
Platelets: 267 10*3/uL (ref 150–400)
RBC: 4.68 MIL/uL (ref 3.87–5.11)
RDW: 14.1 % (ref 11.5–15.5)
WBC: 9.7 10*3/uL (ref 4.0–10.5)
nRBC: 0 % (ref 0.0–0.2)

## 2023-12-01 LAB — PHOSPHORUS: Phosphorus: 2.8 mg/dL (ref 2.5–4.6)

## 2023-12-01 LAB — SURGICAL PATHOLOGY

## 2023-12-01 LAB — MAGNESIUM: Magnesium: 2.1 mg/dL (ref 1.7–2.4)

## 2023-12-01 MED ORDER — ENOXAPARIN SODIUM 40 MG/0.4ML IJ SOSY
40.0000 mg | PREFILLED_SYRINGE | INTRAMUSCULAR | 0 refills | Status: DC
Start: 2023-12-01 — End: 2023-12-14

## 2023-12-01 MED ORDER — METOPROLOL TARTRATE 25 MG PO TABS: 25.0000 mg | ORAL_TABLET | Freq: Two times a day (BID) | ORAL | Status: DC

## 2023-12-01 MED ORDER — POLYETHYLENE GLYCOL 3350 17 G PO PACK
17.0000 g | PACK | Freq: Every day | ORAL | Status: DC
  Filled 2023-12-01: qty 1

## 2023-12-01 MED ORDER — ACETAMINOPHEN 500 MG PO TABS: 1000.0000 mg | ORAL_TABLET | Freq: Four times a day (QID) | ORAL | 0 refills | Status: DC | PRN

## 2023-12-01 MED ORDER — SMOG ENEMA
960.0000 mL | Freq: Once | RECTAL | Status: DC
  Filled 2023-12-01: qty 960

## 2023-12-01 MED ORDER — BISACODYL 5 MG PO TBEC: 10.0000 mg | DELAYED_RELEASE_TABLET | Freq: Every day | ORAL | Status: DC

## 2023-12-01 MED ORDER — BISACODYL 10 MG RE SUPP
10.0000 mg | Freq: Once | RECTAL | Status: AC
  Administered 2023-12-01: 10 mg via RECTAL
  Filled 2023-12-01 (×2): qty 1

## 2023-12-01 MED ORDER — SODIUM CHLORIDE 0.9 % IV BOLUS
500.0000 mL | Freq: Once | INTRAVENOUS | Status: AC
  Administered 2023-12-01: 500 mL via INTRAVENOUS

## 2023-12-01 NOTE — TOC Progression Note (Addendum)
 Transition of Care [facility]) - Progression Note    Patient Details  Name: Mary Washington MRN: [id number] Date of Birth: [birth year]  Transition of Care [facility]) CM/SW Contact  Marinda Cooks, RN Phone Number: 12/01/2023, 4:21 PM  Clinical Narrative:    This CM confirmed with liaison at [facility] pt  can be return tomm. Medical team updated . Ins auth good until 12/02/23 TOC will cont to follow dc planning / care coordination and update as applicable.   Expected Discharge Plan: Skilled Nursing Facility Barriers to Discharge: Continued Medical Work up  Expected Discharge Plan and Services     Post Acute Care Choice: Skilled Nursing Facility Living arrangements for the past 2 months: Single Family Home                                       Social Determinants of Health (SDOH) Interventions SDOH Screenings   Food Insecurity: No Food Insecurity (11/28/2023)  Housing: Low Risk  (11/28/2023)  Transportation Needs: No Transportation Needs (11/28/2023)  Utilities: Not At Risk (11/28/2023)  Financial Resource Strain: Low Risk  (04/21/2023)   Received from [facility]  Social Connections: Patient Declined (11/28/2023)  Tobacco Use: Low Risk  (11/28/2023)    Readmission Risk Interventions    05/01/2021    2:11 PM  Readmission Risk Prevention Plan  PCP or Specialist Appt within 3-5 Days Complete  Social Work Consult for Recovery Care Planning/Counseling Complete  Palliative Care Screening Not Applicable

## 2023-12-01 NOTE — Progress Notes (Addendum)
 [facility] Progress Note  Patient: Mary Washington    FMW:[id number]  DOA: 11/27/2023     Date of Service: the patient was seen and examined on 12/01/2023  Chief Complaint  Patient presents with   Fall   Brief hospital course: Ms. Shaquira Moroz is a 78 year old female with history of large cell lymphoma, asthma, hypertension, melanoma, history of bowel obstruction status post colostomy status, GERD, who presents ED for chief concerns of a fall at a grocery store.  Upon arrival to the ED hip x-ray showed displaced left femoral neck fracture.  Patient was subsequently seen by orthopedic surgeon and taking for fracture repair on 11/28/2023.    Assessment and Plan:   # Closed left femoral fracture (HCC) 6/28 S/p left hip bipolar hemiarthroplasty done by Dr Edie Continue postoperative care Continue as needed pain medication Hypocalcemia, most likely due to hypoalbuminemia, Vitamin D level within normal range.  Started Glucerna PT/OT eval done, recommend SNF placement As per orthopedic surgery staples can be removed by SNF on 12/12/23 and follow-up with [facility] in 6 weeks for x-rays of the left hip. Continue Lovenox  40mg  daily for DVT prophylaxis for 14 days.   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 Cycle 6 was discontinued due to complicated sigmoid diverticulitis.   Melanoma (HCC) At left shoulder, status post skin  grafting   Left shoulder pain X-ray showed osteopenia but no acute fracture or dislocation Continue as needed pain medication  Anemia, mild iron deficiency, iron level 26, TSAT 11% Folic acid and B12 within normal range Started oral iron supplement with vitamin C  # Constipation, resolved after laxatives and Dulcolax suppository x 1 dose on 7/1 given.  Patient had multiple bowel movements and became hypotensive.  As per patient it happens at home as well 7/1 NS 500 mL bolus given Continue to monitor BP Hold off laxatives for now, restart when needed.   Body mass index is 24.8 kg/m.  Interventions:  Diet: Regular diet DVT Prophylaxis: Subcutaneous Lovenox    Advance goals of care discussion: Full code  Family Communication: family was present at bedside, at the time of interview.  The pt provided permission to discuss medical plan with the family. Opportunity was given to ask question and all questions were answered satisfactorily.   Disposition:  Pt is from Home, admitted with left femur fracture, status post ORIF.  Medically optimized, cleared to discharge to SNF when bed will be available. As per [facility] patient got approved for SNF placement at [facility] is to discharge tomorrow a.m.   Subjective: No significant events overnight, patient was resting comfortably in the bed, stated that she feels abdominal distention due to constipation, she is trying to go, she agreed for suppository but she was afraid of having multiple bowel movements. Left hip pain is under control, patient denied any other complaints   Physical Exam: General: NAD, lying comfortably Appear in no distress, affect appropriate Eyes: PERRLA ENT: Oral Mucosa Clear, moist  Neck: no JVD,  Cardiovascular: S1 and S2 Present, no Murmur,  Respiratory: good respiratory effort, Bilateral Air entry equal and Decreased, no Crackles, no wheezes Abdomen: BS, Soft and no tenderness Skin: no rashes Extremities: s/p  left hip ORIF, honeycomb dressing intact and dry, no Pedal edema, no calf tenderness Neurologic: without any new focal findings Gait not checked due to patient safety concerns  Vitals:   11/30/23 2017 12/01/23 0433 12/01/23 0808 12/01/23 1359  BP: (!) 141/64 (!) 154/77 139/67 (!) 92/53  Pulse: 76 77 83 86  Resp: 16 16 16    Temp:  98.3 F (36.8 C) 97.7 F (36.5 C)   TempSrc:   Oral   SpO2: 93% 93% 98% 98%  Weight:      Height:        Intake/Output Summary (Last 24 hours) at 12/01/2023 1442 Last data filed at 12/01/2023 0430 Gross per 24 hour  Intake 8 ml  Output --  Net 8 ml   Filed Weights   11/27/23 1549 11/28/23 1322  Weight: 57.6 kg 57.6 kg    Data Reviewed: I have personally reviewed and interpreted daily labs, tele strips, imagings as discussed above. I reviewed all nursing notes, pharmacy notes, vitals, pertinent old records I have discussed plan of care as described above with RN and patient/family.  CBC: Recent Labs  Lab 11/27/23 1559 11/28/23 0539 11/29/23 0442 11/30/23 0401 12/01/23 0448  WBC 7.5 9.9 10.3 10.0 9.7  NEUTROABS 3.5  --   --   --   --   HGB 12.9 13.9 11.6* 12.0 13.3  HCT 38.8 41.0 33.8* 34.8* 38.9  MCV 87.4 84.2 82.8 83.5 83.1  PLT 253 233 199 229 267   Basic Metabolic Panel: Recent Labs  Lab 11/27/23 1559 11/27/23 1929 11/28/23 0539 11/29/23 0442 11/30/23 0401 12/01/23 0448  NA 138  --  126* 126* 133* 132*  K 2.6* 4.1 4.7 4.5 4.0 3.9  CL 112*  --  97* 96* 100 98  CO2 21*  --  23 22 22 23   GLUCOSE 88  --  127* 133* 118* 115*  BUN 16  --  16 12 16 14   CREATININE 0.62  --  0.63 0.63 0.52 0.47  CALCIUM 6.3*  --  8.7* 8.5* 8.6* 9.3  MG 1.7  --  2.1  --  2.0 2.1  PHOS  --  3.5  --   --  2.7 2.8    Studies: No results found.  Scheduled Meds:  vitamin C  500 mg Oral Daily   [START ON 12/02/2023] bisacodyl  10 mg Oral QHS   calcium-vitamin D  1 tablet Oral Q breakfast   cholecalciferol  2,000 Units Oral Daily   diclofenac Sodium   2 g Topical q12n4p   enoxaparin  (LOVENOX ) injection  40 mg Subcutaneous Q24H   feeding supplement (GLUCERNA SHAKE)  237 mL Oral TID BM   iron polysaccharides  150 mg Oral Daily   [START ON 12/03/2023] metoprolol  tartrate  25 mg Oral BID   [START ON 12/02/2023] polyethylene glycol  17 g Oral Daily   Continuous Infusions: PRN Meds: acetaminophen , bisacodyl, diphenhydrAMINE , lactulose , magnesium  hydroxide, metoCLOPramide **OR** metoCLOPramide (REGLAN) injection, ondansetron  **OR** ondansetron  (ZOFRAN ) IV, sodium phosphate   Time spent: 35 minutes  Author: ELVAN SOR. MD [facility] 12/01/2023 2:42 PM  To reach On-call, see care teams to locate the attending and reach out to them via [email or web address]. If 7PM-7AM, please contact night-coverage If you still have difficulty reaching the attending provider,  please page the [facility] (Director on Call) for [facility] on amion for assistance.

## 2023-12-01 NOTE — Plan of Care (Signed)
   Problem: Education: Goal: Knowledge of General Education information will improve Description: Including pain rating scale, medication(s)/side effects and non-pharmacologic comfort measures Outcome: Progressing   Problem: Activity: Goal: Risk for activity intolerance will decrease Outcome: Progressing   Problem: Elimination: Goal: Will not experience complications related to bowel motility Outcome: Progressing

## 2023-12-01 NOTE — Progress Notes (Addendum)
 Occupational Therapy Treatment Patient Details Name: Mary Washington MRN: [id number] DOB: 1945/11/07 Today's Date: 12/01/2023   History of present illness Pt is a 77 year old female with history of large cell lymphoma, asthma, hypertension, melanoma, history of bowel obstruction status post colostomy status, GERD, who presented to the ED for chief concerns of a fall at a grocery store. Pt is s/p left hip bipolar hemiarthroplasty.   OT comments  Chart reviewed to date, pt greeted in bed, agreeable to OT tx session targeting improving functional activity tolerance and ADL performance. Pt is making progress towards goals as evidenced by performing bed mobility with CGA, STS with CGA, multiple transfers to bsc with CGA with RW. MAX A required for peri care due to loose BM multiple times on this date. MIN A for LB dressing with AE with hand out provided to support carry over. Nurse notified of pt status. Pt is left as received, all needs met, OT will continue to follow.   BP in semi supine: 136/64 (MAP 83) HR 72 bpm, spo2 96% on RA  Sitting on edge of bed 139/76 (MAP 93) HR 86 bpm, spo2 96%  After transfer to bsc and back (2x) on bsc after having BM 92/53 (MAP 63)  HR 86 bpm spo2 96% on RA  Back in bed: 128/65 HR 72 bpm spo2 >90% on RA       If plan is discharge home, recommend the following:  A little help with walking and/or transfers;A little help with bathing/dressing/bathroom;Assistance with cooking/housework;Assist for transportation;Help with stairs or ramp for entrance   Equipment Recommendations  BSC/3in1    Recommendations for Other Services      Precautions / Restrictions Precautions Precautions: Posterior Hip Recall of Precautions/Restrictions: Intact Restrictions Weight Bearing Restrictions Per Provider Order: Yes LLE Weight Bearing Per Provider Order: Weight bearing as tolerated       Mobility Bed Mobility Overal bed mobility: Needs Assistance Bed Mobility: Sit to Supine,  Supine to Sit     Supine to sit: Contact guard, HOB elevated Sit to supine: Contact guard assist, HOB elevated        Transfers Overall transfer level: Needs assistance Equipment used: Rolling walker (2 wheels) Transfers: Sit to/from Stand Sit to Stand: Contact guard assist                 Balance Overall balance assessment: Needs assistance Sitting-balance support: Feet supported, No upper extremity supported Sitting balance-Leahy Scale: Good     Standing balance support: Bilateral upper extremity supported, During functional activity, Reliant on assistive device for balance Standing balance-Leahy Scale: Fair                             ADL either performed or assessed with clinical judgement   ADL Overall ADL's : Needs assistance/impaired Eating/Feeding: Set up   Grooming: Maximal assistance;Sitting               Lower Body Dressing: Minimal assistance Lower Body Dressing Details (indicate cue type and reason): doff/donn socks with use of AE, frequent cueing for technique Toilet Transfer: Contact guard assist;BSC/3in1;Rolling walker (2 wheels) Toilet Transfer Details (indicate cue type and reason): step pivot to bsc and from bed,  2 attempts Toileting- Clothing Manipulation and Hygiene: Maximal assistance;Sit to/from stand              Extremity/Trunk Assessment              Vision  Perception     Praxis     Communication Communication Communication: No apparent difficulties   Cognition Arousal: Alert Behavior During Therapy: WFL for tasks assessed/performed Cognition: No apparent impairments                               Following commands: Intact        Cueing   Cueing Techniques: Verbal cues, Visual cues  Exercises Other Exercises Other Exercises: edu re: role of OT, role of rehab, discharge recommendations    Shoulder Instructions       General Comments honeycomb dressing appears saturated,  nurse notified    Pertinent Vitals/ Pain       Pain Assessment Pain Assessment: Faces Faces Pain Scale: Hurts a little bit Pain Location: L hip Pain Descriptors / Indicators: Discomfort Pain Intervention(s): Monitored during session, Repositioned  Home Living                                          Prior Functioning/Environment              Frequency  Min 2X/week        Progress Toward Goals  OT Goals(current goals can now be found in the care plan section)  Progress towards OT goals: Progressing toward goals  Acute Rehab OT Goals Time For Goal Achievement: 12/13/23  Plan      Co-evaluation                 AM-PAC OT 6 Clicks Daily Activity     Outcome Measure   Help from another person eating meals?: None Help from another person taking care of personal grooming?: None Help from another person toileting, which includes using toliet, bedpan, or urinal?: A Lot Help from another person bathing (including washing, rinsing, drying)?: A Little Help from another person to put on and taking off regular upper body clothing?: None Help from another person to put on and taking off regular lower body clothing?: A Little 6 Click Score: 20    End of Session Equipment Utilized During Treatment: Rolling walker (2 wheels)  OT Visit Diagnosis: Unsteadiness on feet (R26.81);Repeated falls (R29.6);Muscle weakness (generalized) (M62.81)   Activity Tolerance Patient tolerated treatment well   Patient Left in bed;with call bell/phone within reach;with bed alarm set   Nurse Communication Mobility status;Other (comment) (vitals, dressing)        Time: 8693-8666 OT Time Calculation (min): 27 min  Charges: OT General Charges $OT Visit: 1 Visit OT Treatments $Self Care/Home Management : 8-22 mins $Therapeutic Activity: 8-22 mins  Therisa Sheffield, OTD OTR/L  12/01/23, 2:07 PM

## 2023-12-01 NOTE — Progress Notes (Signed)
 PT Cancellation Note  Patient Details Name: Mary Washington MRN: [id number] DOB: [birth year]   Cancelled Treatment:    Reason Eval/Treat Not Completed: Other (comment). Pt declined PT politely,stated she wasn't feeling well, was having a lot of BM. PT to re-attempt as able.    Doyal Shams PT, DPT 2:25 PM,12/01/23

## 2023-12-01 NOTE — Progress Notes (Signed)
 Physical Therapy Treatment Patient Details Name: Mary Washington MRN: [id number] DOB: 1946/02/01 Today's Date: 12/01/2023   History of Present Illness Pt is a 78 year old female with history of large cell lymphoma, asthma, hypertension, melanoma, history of bowel obstruction status post colostomy status, GERD, who presented to the ED for chief concerns of a fall at a grocery store. Pt is s/p left hip bipolar hemiarthroplasty.    PT Comments  Pt alert, agreeable to PT, reported ongoing difficulty with BM, abdominal pain/discomfort. Agreeable to ambulate in room. CGA for bed mobility, CGA to stand with RW from EOB and from [facility]. Able to ambulate ~56ft in room but limited by fatigue and whooziness this PM. BP in sitting on commode 95/53, HR 89. Returned to supine with needs in reach. The patient would benefit from further skilled PT intervention to continue to progress towards goals.     If plan is discharge home, recommend the following: A little help with walking and/or transfers;A lot of help with bathing/dressing/bathroom;Assistance with cooking/housework;Assist for transportation;Help with stairs or ramp for entrance   Can travel by private vehicle     Yes  Equipment Recommendations  Other (comment) (TBD)    Recommendations for Other Services       Precautions / Restrictions Precautions Precautions: Posterior Hip Precaution Booklet Issued: Yes (comment) Recall of Precautions/Restrictions: Intact Precaution/Restrictions Comments: recalls 3/3 hip precautions Restrictions Weight Bearing Restrictions Per Provider Order: Yes LLE Weight Bearing Per Provider Order: Weight bearing as tolerated     Mobility  Bed Mobility Overal bed mobility: Needs Assistance Bed Mobility: Sit to Supine, Supine to Sit     Supine to sit: Contact guard, HOB elevated Sit to supine: Contact guard assist   General bed mobility comments: NT    Transfers Overall transfer level: Needs assistance Equipment  used: Rolling walker (2 wheels) Transfers: Sit to/from Stand Sit to Stand: Contact guard assist           General transfer comment: effortful but CGA from EOB and BSC    Ambulation/Gait Ambulation/Gait assistance: Contact guard assist Gait Distance (Feet): 20 Feet Assistive device: Rolling walker (2 wheels) Gait Pattern/deviations: Step-through pattern, Decreased stance time - left, Decreased weight shift to left, Trunk flexed, Knee hyperextension - right, Knee hyperextension - left Gait velocity: decreased     General Gait Details: fatigued, standing rest break needed, short shuffled steps   Stairs             Wheelchair Mobility     Tilt Bed    Modified Rankin (Stroke Patients Only)       Balance Overall balance assessment: Needs assistance Sitting-balance support: Feet supported, No upper extremity supported Sitting balance-Leahy Scale: Good     Standing balance support: Bilateral upper extremity supported, During functional activity, Reliant on assistive device for balance Standing balance-Leahy Scale: Fair Standing balance comment: Reliant on RW for standing balance, no LOB noted during ambualtion                            Communication Communication Communication: No apparent difficulties  Cognition Arousal: Alert Behavior During Therapy: WFL for tasks assessed/performed   PT - Cognitive impairments: No apparent impairments                         Following commands: Intact      Cueing Cueing Techniques: Verbal cues, Visual cues  Exercises      General Comments  General comments (skin integrity, edema, etc.): honeycomb dressing appears saturated, nurse notified      Pertinent Vitals/Pain Pain Assessment Pain Assessment: 0-10 Pain Score: 2  Pain Location: reported stiffness vs pain Pain Descriptors / Indicators: Sore Pain Intervention(s): Monitored during session    Home Living                           Prior Function            PT Goals (current goals can now be found in the care plan section) Progress towards PT goals: Progressing toward goals    Frequency    BID      PT Plan      Co-evaluation              AM-PAC PT 6 Clicks Mobility   Outcome Measure  Help needed turning from your back to your side while in a flat bed without using bedrails?: A Little Help needed moving from lying on your back to sitting on the side of a flat bed without using bedrails?: A Little Help needed moving to and from a bed to a chair (including a wheelchair)?: A Little Help needed standing up from a chair using your arms (e.g., wheelchair or bedside chair)?: A Little Help needed to walk in hospital room?: A Little Help needed climbing 3-5 steps with a railing? : A Lot 6 Click Score: 17    End of Session Equipment Utilized During Treatment: Gait belt Activity Tolerance: Patient tolerated treatment well Patient left: in bed;with call bell/phone within reach;with bed alarm set Nurse Communication: Mobility status PT Visit Diagnosis: Unsteadiness on feet (R26.81);Other abnormalities of gait and mobility (R26.89);Pain Pain - Right/Left: Left Pain - part of body: Hip     Time: 8458-8443 PT Time Calculation (min) (ACUTE ONLY): 15 min  Charges:    $Therapeutic Activity: 8-22 mins PT General Charges $$ ACUTE PT VISIT: 1 Visit                     Doyal Shams PT, DPT 4:07 PM,12/01/23

## 2023-12-01 NOTE — Progress Notes (Signed)
 Physical Therapy Treatment Patient Details Name: Mary Washington MRN: [id number] DOB: Jul 14, 1945 Today's Date: 12/01/2023   History of Present Illness Pt is a 78 year old female with history of large cell lymphoma, asthma, hypertension, melanoma, history of bowel obstruction status post colostomy status, GERD, who presented to the ED for chief concerns of a fall at a grocery store. Pt is s/p left hip bipolar hemiarthroplasty.    PT Comments  Patient alert, agreeable to PT. Seated on commode. Able to stand with CGA and RW, performed pericare in standing, good adherence to precautions. She ambulated ~73ft with RW and CGA, did fatigue quickly and standing rest break needed. Shuffled step throughout, cued for upright posture. After returning to recliner, asked to transfer back to [facility], CGA with RW. The patient would benefit from further skilled PT intervention to progress towards goals.     If plan is discharge home, recommend the following: A little help with walking and/or transfers;A lot of help with bathing/dressing/bathroom;Assistance with cooking/housework;Assist for transportation;Help with stairs or ramp for entrance   Can travel by private vehicle     Yes  Equipment Recommendations  Other (comment) (TBD)    Recommendations for Other Services       Precautions / Restrictions Precautions Precautions: Posterior Hip Precaution Booklet Issued: Yes (comment) Recall of Precautions/Restrictions: Intact Precaution/Restrictions Comments: recalls 3/3 hip precautions Restrictions Weight Bearing Restrictions Per Provider Order: Yes LLE Weight Bearing Per Provider Order: Weight bearing as tolerated     Mobility  Bed Mobility               General bed mobility comments: NT    Transfers Overall transfer level: Needs assistance Equipment used: Rolling walker (2 wheels) Transfers: Sit to/from Stand Sit to Stand: Contact guard assist           General transfer comment: from elevated  BSC, and from recliner    Ambulation/Gait Ambulation/Gait assistance: Contact guard assist Gait Distance (Feet):  (68ft) Assistive device: Rolling walker (2 wheels) Gait Pattern/deviations: Step-through pattern, Decreased stance time - left, Decreased weight shift to left, Trunk flexed, Knee hyperextension - right, Knee hyperextension - left Gait velocity: decreased     General Gait Details: fatigued, standing rest break needed, short shuffled steps   Stairs             Wheelchair Mobility     Tilt Bed    Modified Rankin (Stroke Patients Only)       Balance Overall balance assessment: Needs assistance Sitting-balance support: Feet supported, No upper extremity supported Sitting balance-Leahy Scale: Good     Standing balance support: Bilateral upper extremity supported, During functional activity, Reliant on assistive device for balance Standing balance-Leahy Scale: Fair Standing balance comment: Reliant on RW for standing balance, no LOB noted during ambualtion                            Communication    Cognition Arousal: Alert Behavior During Therapy: WFL for tasks assessed/performed   PT - Cognitive impairments: No apparent impairments                                Cueing    Exercises      General Comments        Pertinent Vitals/Pain Pain Assessment Pain Assessment: 0-10 Pain Score: 2  Pain Descriptors / Indicators: Sore Pain Intervention(s): Limited activity within patient's tolerance, Monitored  during session, Repositioned    Home Living                          Prior Function            PT Goals (current goals can now be found in the care plan section) Progress towards PT goals: Progressing toward goals    Frequency    BID      PT Plan      Co-evaluation              AM-PAC PT 6 Clicks Mobility   Outcome Measure  Help needed turning from your back to your side while in a flat  bed without using bedrails?: A Little Help needed moving from lying on your back to sitting on the side of a flat bed without using bedrails?: A Little Help needed moving to and from a bed to a chair (including a wheelchair)?: A Little Help needed standing up from a chair using your arms (e.g., wheelchair or bedside chair)?: A Little Help needed to walk in hospital room?: A Little Help needed climbing 3-5 steps with a railing? : A Lot 6 Click Score: 17    End of Session Equipment Utilized During Treatment: Gait belt Activity Tolerance: Patient tolerated treatment well Patient left: Other (comment) (seated on BSC with call bell in hand) Nurse Communication: Mobility status PT Visit Diagnosis: Unsteadiness on feet (R26.81);Other abnormalities of gait and mobility (R26.89);Pain Pain - Right/Left: Left Pain - part of body: Hip     Time: 0902-0918 PT Time Calculation (min) (ACUTE ONLY): 16 min  Charges:    $Therapeutic Activity: 8-22 mins PT General Charges $$ ACUTE PT VISIT: 1 Visit                    Doyal Shams PT, DPT 1:08 PM,12/01/23

## 2023-12-01 NOTE — Progress Notes (Signed)
 Subjective: 3 Days Post-Op Procedure(s) (LRB): HEMIARTHROPLASTY (BIPOLAR) HIP, POSTERIOR APPROACH FOR FRACTURE (Left) Patient reports pain as mild in the left hip. Patient is well, and has had no acute complaints or problems Current plan is for d/c to SNF, plan is for d/c to [facility]. Negative for chest pain and shortness of breath Fever: no Gastrointestinal:Negative for nausea and vomiting this AM. Reports she is passing gas, no BM yet.  Objective: Vital signs in last 24 hours: Temp:  [97.9 F (36.6 C)-98.3 F (36.8 C)] 98.3 F (36.8 C) (07/01 0433) Pulse Rate:  [76-90] 77 (07/01 0433) Resp:  [16-18] 16 (07/01 0433) BP: (125-154)/(64-77) 154/77 (07/01 0433) SpO2:  [93 %-97 %] 93 % (07/01 0433)  Intake/Output from previous day:  Intake/Output Summary (Last 24 hours) at 12/01/2023 0652 Last data filed at 12/01/2023 0430 Gross per 24 hour  Intake 888 ml  Output 325 ml  Net 563 ml    Intake/Output this shift: Total I/O In: 8 [P.O.:8] Out: -   Labs: Recent Labs    11/29/23 0442 11/30/23 0401 12/01/23 0448  HGB 11.6* 12.0 13.3   Recent Labs    11/30/23 0401 12/01/23 0448  WBC 10.0 9.7  RBC 4.17 4.68  HCT 34.8* 38.9  PLT 229 267   Recent Labs    11/30/23 0401 12/01/23 0448  NA 133* 132*  K 4.0 3.9  CL 100 98  CO2 22 23  BUN 16 14  CREATININE 0.52 0.47  GLUCOSE 118* 115*  CALCIUM 8.6* 9.3   No results for input(s): LABPT, INR in the last 72 hours.   EXAM General - Patient is Alert, Appropriate, and Oriented Extremity - ABD soft Neurovascular intact Dorsiflexion/Plantar flexion intact No cellulitis present Compartment soft Dressing/Incision - Mild bloody drainage noted to the left hip honeycomb dressing. Motor Function - intact, moving foot and toes well on exam.  Abdomen soft with intact bowel sounds.  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)    Melanoma (HCC) 1988   lt upper arm, some lymph nodes removed   Mild intermittent asthma    well controlled   Osteoporosis    Pneumonia 2022   Tachycardia     Assessment/Plan: 3 Days Post-Op Procedure(s) (LRB): HEMIARTHROPLASTY (BIPOLAR) HIP, POSTERIOR APPROACH FOR FRACTURE (Left) Principal Problem:   Closed left femoral fracture (HCC) Active Problems:   Melanoma (HCC)   Generalized lymphadenopathy   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   Weight as of this encounter: 57.6 kg. Advance diet Up with therapy D/C IV fluids when tolerating po intake.  Labs and vitals reviewed, Hg 12.0, WBC 10.0.  Hyponatremia improving. Up with therapy.  Patient lives with husband in one story home, one  step to get up.  Plan is for d/c to [facility]. Continue to work on a BM.  Move to FLEET enema if needed today.  Following discharge, staples can be removed by SNF on 12/12/23 and follow-up with [facility] in 6 weeks for x-rays of the left hip. Continue Lovenox  40mg  daily for DVT prophylaxis for 14 days.  DVT Prophylaxis - Lovenox  and TED hose Weight-Bearing as tolerated to left leg  J. Gustavo Level, PA-C [facility] 12/01/2023, 6:52 AM

## 2023-12-01 NOTE — Discharge Instructions (Signed)
 Diet: As you were doing prior to hospitalization   Shower:  May shower but keep the wounds dry, use an occlusive plastic wrap, NO SOAKING IN TUB.  If the bandage gets wet, change with a clean dry gauze.  Dressing:  You may change your dressing as needed. Change the dressing with sterile gauze dressing.    Activity:  Increase activity slowly as tolerated, but follow the weight bearing instructions below.  No lifting or driving for 6 weeks.  Weight Bearing:   Weight bearing as tolerated to left lower extremity  To prevent constipation: you may use a stool softener such as -  Colace (over the counter) 100 mg by mouth twice a day  Drink plenty of fluids (prune juice may be helpful) and high fiber foods Miralax  (over the counter) for constipation as needed.    Itching:  If you experience itching with your medications, try taking only a single pain pill, or even half a pain pill at a time.  You may take up to 10 pain pills per day, and you can also use benadryl  over the counter for itching or also to help with sleep.   Precautions:  If you experience chest pain or shortness of breath - call 911 immediately for transfer to the hospital emergency department!!  If you develop a fever greater that 101 F, purulent drainage from wound, increased redness or drainage from wound, or calf pain-Call [facility]                                              Follow- Up Appointment:  Please call for an appointment to be seen in 6 weeks at [facility].  Staples will be removed by SNF on 12/12/23

## 2023-12-02 DIAGNOSIS — S7292XA Unspecified fracture of left femur, initial encounter for closed fracture: Secondary | ICD-10-CM | POA: Diagnosis not present

## 2023-12-02 LAB — BASIC METABOLIC PANEL WITH GFR
Anion gap: 9 (ref 5–15)
BUN: 13 mg/dL (ref 8–23)
CO2: 23 mmol/L (ref 22–32)
Calcium: 8.5 mg/dL — ABNORMAL LOW (ref 8.9–10.3)
Chloride: 94 mmol/L — ABNORMAL LOW (ref 98–111)
Creatinine, Ser: 0.51 mg/dL (ref 0.44–1.00)
GFR, Estimated: >60 mL/min (ref >60)
Glucose, Bld: 109 mg/dL — ABNORMAL HIGH (ref 70–99)
Potassium: 3.9 mmol/L (ref 3.5–5.1)
Sodium: 126 mmol/L — ABNORMAL LOW (ref 135–145)

## 2023-12-02 LAB — CBC
HCT: 40.2 % (ref 36.0–46.0)
Hemoglobin: 13.6 g/dL (ref 12.0–15.0)
MCH: 28.3 pg (ref 26.0–34.0)
MCHC: 33.8 g/dL (ref 30.0–36.0)
MCV: 83.6 fL (ref 80.0–100.0)
Platelets: 284 10*3/uL (ref 150–400)
RBC: 4.81 MIL/uL (ref 3.87–5.11)
RDW: 13.9 % (ref 11.5–15.5)
WBC: 9 10*3/uL (ref 4.0–10.5)
nRBC: 0 % (ref 0.0–0.2)

## 2023-12-02 LAB — MAGNESIUM: Magnesium: 2.1 mg/dL (ref 1.7–2.4)

## 2023-12-02 LAB — PHOSPHORUS: Phosphorus: 3.2 mg/dL (ref 2.5–4.6)

## 2023-12-02 MED ORDER — POLYSACCHARIDE IRON COMPLEX 150 MG PO CAPS
150.0000 mg | ORAL_CAPSULE | Freq: Every day | ORAL | Status: DC
Start: 2023-12-03 — End: 2024-01-11

## 2023-12-02 MED ORDER — ASCORBIC ACID 500 MG PO TABS: 500.0000 mg | ORAL_TABLET | Freq: Every day | ORAL | Status: DC

## 2023-12-02 MED ORDER — POLYETHYLENE GLYCOL 3350 17 G PO PACK: 17.0000 g | PACK | Freq: Every day | ORAL | Status: AC

## 2023-12-02 MED ORDER — ACETAMINOPHEN 325 MG PO TABS: 650.0000 mg | ORAL_TABLET | Freq: Four times a day (QID) | ORAL | Status: AC | PRN

## 2023-12-02 MED ORDER — TRAMADOL HCL 50 MG PO TABS: 50.0000 mg | ORAL_TABLET | Freq: Three times a day (TID) | ORAL | 0 refills | Status: DC | PRN

## 2023-12-02 MED ORDER — GLUCERNA SHAKE PO LIQD: 237.0000 mL | Freq: Three times a day (TID) | ORAL | Status: AC

## 2023-12-02 MED ORDER — METOPROLOL TARTRATE 25 MG PO TABS
12.5000 mg | ORAL_TABLET | Freq: Two times a day (BID) | ORAL | Status: DC
  Administered 2023-12-02: 12.5 mg via ORAL
  Filled 2023-12-02: qty 1

## 2023-12-02 MED ORDER — METOPROLOL TARTRATE 25 MG PO TABS: 25.0000 mg | ORAL_TABLET | Freq: Two times a day (BID) | ORAL | Status: AC

## 2023-12-02 NOTE — Progress Notes (Signed)
 Physical Therapy Treatment Patient Details Name: Mary Washington MRN: [id number] DOB: 04/16/1946 Today's Date: 12/02/2023   History of Present Illness Pt is a 78 year old female with history of large cell lymphoma, asthma, hypertension, melanoma, history of bowel obstruction status post colostomy status, GERD, who presented to the ED for chief concerns of a fall at a grocery store. Pt is s/p left hip bipolar hemiarthroplasty.    PT Comments  Patient alert, agreeable to PT, reported mild pain in L knee vs hip. Able to perform most mobility today with CGA, RW. Recalled 2/3 hip precautions. She ambulated two bouts, seated rest break needed due to fatigue. Pt also used commode, able to perform pericare in standing with CGA as well. The patient would benefit from further skilled PT intervention to continue to progress towards goals.     If plan is discharge home, recommend the following: A little help with walking and/or transfers;A lot of help with bathing/dressing/bathroom;Assistance with cooking/housework;Assist for transportation;Help with stairs or ramp for entrance   Can travel by private vehicle     Yes  Equipment Recommendations  Other (comment) (TBD)    Recommendations for Other Services       Precautions / Restrictions Precautions Precautions: Posterior Hip Precaution Booklet Issued: Yes (comment) Recall of Precautions/Restrictions: Intact Restrictions Weight Bearing Restrictions Per Provider Order: Yes LLE Weight Bearing Per Provider Order: Weight bearing as tolerated     Mobility  Bed Mobility Overal bed mobility: Needs Assistance Bed Mobility: Supine to Sit     Supine to sit: Supervision, HOB elevated          Transfers Overall transfer level: Needs assistance Equipment used: Rolling walker (2 wheels) Transfers: Sit to/from Stand Sit to Stand: Contact guard assist           General transfer comment: effortful but CGA from EOB and BSC     Ambulation/Gait Ambulation/Gait assistance: Contact guard assist Gait Distance (Feet):  (118ft, 89ft) Assistive device: Rolling walker (2 wheels) Gait Pattern/deviations: Step-through pattern, Decreased stance time - left, Decreased weight shift to left, Trunk flexed, Knee hyperextension - right, Knee hyperextension - left       General Gait Details: fatigued, seated rest break needed   Stairs             Wheelchair Mobility     Tilt Bed    Modified Rankin (Stroke Patients Only)       Balance Overall balance assessment: Needs assistance Sitting-balance support: Feet supported, No upper extremity supported Sitting balance-Leahy Scale: Good     Standing balance support: Bilateral upper extremity supported, During functional activity, Reliant on assistive device for balance, Single extremity supported Standing balance-Leahy Scale: Fair Standing balance comment: pericare in standing with CGA                            Communication    Cognition Arousal: Alert Behavior During Therapy: WFL for tasks assessed/performed   PT - Cognitive impairments: No apparent impairments                         Following commands: Intact      Cueing    Exercises      General Comments        Pertinent Vitals/Pain Pain Assessment Pain Assessment: Faces Pain Score: 2  Pain Location: reported stiffness vs pain Pain Descriptors / Indicators: Discomfort    Home Living  Prior Function            PT Goals (current goals can now be found in the care plan section) Progress towards PT goals: Progressing toward goals    Frequency    BID      PT Plan      Co-evaluation              AM-PAC PT 6 Clicks Mobility   Outcome Measure  Help needed turning from your back to your side while in a flat bed without using bedrails?: A Little Help needed moving from lying on your back to sitting on the side of  a flat bed without using bedrails?: A Little Help needed moving to and from a bed to a chair (including a wheelchair)?: A Little Help needed standing up from a chair using your arms (e.g., wheelchair or bedside chair)?: A Little Help needed to walk in hospital room?: A Little Help needed climbing 3-5 steps with a railing? : A Lot 6 Click Score: 17    End of Session Equipment Utilized During Treatment: Gait belt Activity Tolerance: Patient tolerated treatment well Patient left: with call bell/phone within reach;in chair;with chair alarm set Nurse Communication: Mobility status PT Visit Diagnosis: Unsteadiness on feet (R26.81);Other abnormalities of gait and mobility (R26.89);Pain Pain - Right/Left: Left Pain - part of body: Hip     Time: 9076-9057 PT Time Calculation (min) (ACUTE ONLY): 19 min  Charges:    $Therapeutic Activity: 8-22 mins PT General Charges $$ ACUTE PT VISIT: 1 Visit                     Doyal Shams PT, DPT 11:27 AM,12/02/23

## 2023-12-02 NOTE — Discharge Summary (Signed)
 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  Blood pressures running soft, so may decrease the dose of Lopressor  12.5 mg p.o. twice daily versus hold if blood pressure is low. As per orthopedic surgery, staples can be removed by SNF on 12/12/23 and follow-up with [facility] in 6 weeks for x-rays of the left hip.  Continue Lovenox  40mg  daily for DVT prophylaxis for 14 days. Follow up LABS/TEST: Iron profile after 3 to 6 months, CBC and BMP after 1 week   Contact information for follow-up providers     Kip Lynwood Double, PA-C Follow up in 6 week(s).   Specialty: Physician Assistant Why: Follow-up for x-rays of the left hip. Staples will be removed by SNF on 12/12/23 Contact information: [address] [place] [address] [phone]         Lenon Layman ORN, MD Follow up in 1 week(s).   Specialty: Internal Medicine Contact information: [address] [facility] [place] [address] [phone]              Contact information for after-discharge care     Destination     [facility] .   Service: Skilled Nursing Contact information: [address] [place]  [address] [phone]                    Diet recommendation: Cardiac diet, fluid restriction 1.5 to 2/day due to  hyponatremia  Activity: The patient is advised to gradually reintroduce usual activities, as tolerated  Discharge Condition: stable  Code Status: Full code   History of present illness: As per the H and P dictated on admission. Hospital Course:  [name] is a 78 year old female with history of large cell lymphoma, asthma, hypertension, melanoma, history of bowel obstruction status post colostomy status, GERD, who presents ED for chief concerns of a fall at a grocery store.  Upon arrival to the ED hip x-ray showed displaced left femoral neck fracture.  Patient was subsequently seen by orthopedic surgeon and taking for fracture repair on 11/28/2023.      Assessment and Plan:   # Closed left femoral fracture: 6/28 S/p left hip bipolar hemiarthroplasty done by Dr Edie Continue postoperative care. Continue prn pain medication Hypocalcemia, most likely due to hypoalbuminemia, Vitamin D level within normal range.  Started Glucerna PT/OT eval done, recommend SNF placement As per orthopedic surgery staples can be removed by SNF on 12/12/23 and follow-up with [facility] in 6 weeks for x-rays of the left hip. Continue Lovenox  40mg  daily for DVT prophylaxis for 14 days.   # Hypokalemia: Potassium repleted and resolved. # Hyponatremia likely secondary to SIADH Na 126--133 --132--126 again low, fluctuating. She did receive IV fluid on presentation. Currently appears euvolemic. Serum osmolarity 259, urine sodium 44, urine osmolarity 631 in  keeping with SIADH picture We will continue with fluid restriction 1.5 L/day Monitor sodium level, repeat BMP after 1 week   # Hypocalcemia due to hypoalbuminemia Patient presented with calcium 6.3--93--8.5 today  Albumin  was 2.5, So corrected calcium within normal limit Continue protein supplement.    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 Cycle 6 was discontinued due to complicated sigmoid diverticulitis.   #  Melanoma: At left shoulder, status post skin grafting # Left shoulder pain: X-ray showed osteopenia but no acute fracture or dislocation. Continue as needed pain medication # Anemia, mild iron deficiency, iron level 26, TSAT 11% Folic acid and B12 within normal range. Started oral iron supplement with vitamin C.  Repeat iron profile after 3 to 6 months   # Constipation, resolved after laxatives and Dulcolax suppository x 1 dose on 7/1 given.  Patient had multiple bowel movements and became hypotensive.  As per patient it happens at home as well 7/1 NS 500 mL bolus given. Continue to monitor BP Hold off laxatives for now, restart when needed.    Body mass index is 24.8 kg/m.  Nutrition Interventions:  Pain control  - [place]  Controlled Substance Reporting System database could not be reviewed as website was not working. - Tramadol  prescription was given by orthopedic surgery.  - Patient was instructed, not to drive, operate heavy machinery, perform activities at heights, swimming or participation in water  activities or provide baby sitting services while on Pain, Sleep and Anxiety Medications; until her outpatient Physician has advised to do so again.  - Also recommended to not to take more than prescribed Pain, Sleep and Anxiety Medications.  Patient was seen by physical therapy, who recommended Therapy, SNF placement, which was arranged. On the day of the discharge the patient's vitals were stable, and no other acute medical condition were reported by patient. the patient was felt safe to be discharge at SNF with Therapy.  Consultants: Orthopedic surgery Procedures: 6/28 S/p left hip bipolar hemiarthroplasty done by Dr Edie   Discharge Exam: General: Appear in no distress, no Rash; Oral Mucosa Clear, moist. Cardiovascular: S1 and S2 Present, no Murmur, Respiratory: normal respiratory effort, Bilateral Air entry present and no Crackles, no wheezes Abdomen: Bowel Sound present,  Soft and no tenderness, no hernia Extremities: no Pedal edema, no calf tenderness Neurology: alert and oriented to time, place, and person affect appropriate.  Filed Weights   11/27/23 1549 11/28/23 1322  Weight: 57.6 kg 57.6 kg   Vitals:   12/02/23 0240 12/02/23 0832  BP: (!) 123/58 (!) 126/57  Pulse: 87 (!) 104  Resp: 16 16  Temp: 97.9 F (36.6 C) 98 F (36.7 C)  SpO2: 98% 97%    DISCHARGE MEDICATION: Allergies as of 12/02/2023   No Known Allergies      Medication List     STOP taking these medications    omeprazole 20 MG capsule Commonly known as: PRILOSEC       TAKE these medications    acetaminophen  325 MG tablet Commonly known as: TYLENOL  Take 2 tablets (650 mg total) by mouth every 6 (six) hours as needed for mild pain (pain score 1-3), fever or headache. What changed:  medication strength how much to take reasons to take this   ascorbic acid 500 MG tablet Commonly known as: VITAMIN C Take 1 tablet (500 mg total) by mouth daily. Start taking on: December 03, 2023   Calcium Carb-Cholecalciferol 600-20 MG-MCG Tabs  Take 1 tablet by mouth daily. citracal   diclofenac Sodium 1 % Gel Commonly known as: VOLTAREN Apply 2 g topically 2 (two) times daily as needed (left leg pain).   enoxaparin  40 MG/0.4ML injection Commonly known as: LOVENOX  Inject 0.4 mLs (40 mg total) into the skin daily.   feeding supplement (GLUCERNA SHAKE) Liqd Take 237 mLs by mouth 3 (three) times daily between meals.   ibandronate 150 MG tablet Commonly known as: BONIVA Take 150 mg by mouth every 30 (thirty) days. Take in the morning with a full glass of water , on an empty stomach, and do not take anything else by mouth or lie down for the next 30 min.   iron polysaccharides 150 MG capsule Commonly known as: NIFEREX Take 1 capsule (150 mg total) by mouth daily. Start taking on: December 03, 2023   lidocaine -prilocaine  cream Commonly known as: EMLA  1 application. as needed (on her  port).   MAG-OXIDE PO Take 250 mg by mouth daily at 12 noon.   metoprolol  tartrate 25 MG tablet Commonly known as: LOPRESSOR  Take 1 tablet (25 mg total) by mouth 2 (two) times daily. Use 12.5 mg p.o. twice daily if blood pressure remains soft our prescription that was if blood pressure is low. Hold if SBP <110 mmHg and or HR <65 What changed: additional instructions   polyethylene glycol 17 g packet Commonly known as: MIRALAX  / GLYCOLAX  Take 17 g by mouth daily. Start taking on: December 04, 2023 What changed: These instructions start on December 04, 2023. If you are unsure what to do until then, ask your doctor or other care provider.   traMADol  50 MG tablet Commonly known as: Ultram  Take 1 tablet (50 mg total) by mouth every 8 (eight) hours as needed (breakthrough pain.).   Vitamin D 50 MCG (2000 UT) Caps Take 2,000 Units by mouth daily.   WOMENS MULTIVITAMIN PO Take 1 Tera Vector Genomes by mouth daily.               Discharge Care Instructions  (From admission, onward)           Start     Ordered   12/02/23 0000  Discharge wound care:       Comments: As per orthopedic surgery   12/02/23 1040           No Known Allergies Discharge Instructions     Call MD for:  difficulty breathing, headache or visual disturbances   Complete by: As directed    Call MD for:  extreme fatigue   Complete by: As directed    Call MD for:  persistant dizziness or light-headedness   Complete by: As directed    Call MD for:  persistant nausea and vomiting   Complete by: As directed    Call MD for:  redness, tenderness, or signs of infection (pain, swelling, redness, odor or green/yellow discharge around incision site)   Complete by: As directed    Call MD for:  severe uncontrolled pain   Complete by: As directed    Call MD for:  temperature >100.4   Complete by: As directed    Diet - low sodium heart healthy   Complete by: As directed    Discharge instructions   Complete by: As  directed    Follow with PCP, patient should be seen by an MD in 1 to 2 days, continue to monitor BP and titrate medications accordingly.  Blood pressures running soft, so may decrease the dose  of Lopressor  12.5 mg p.o. twice daily versus hold if blood pressure is low. As per orthopedic surgery, staples can be removed by SNF on 12/12/23 and follow-up with [facility] in 6 weeks for x-rays of the left hip.  Continue Lovenox  40mg  daily for DVT prophylaxis for 14 days.   Discharge wound care:   Complete by: As directed    As per orthopedic surgery   Increase activity slowly   Complete by: As directed        The results of significant diagnostics from this hospitalization (including imaging, microbiology, ancillary and laboratory) are listed below for reference.    Significant Diagnostic Studies: DG HIP UNILAT W OR W/O PELVIS 2-3 VIEWS LEFT Result Date: 11/28/2023 CLINICAL DATA:  [id number] Status post hip hemiarthroplasty [id number] EXAM: DG HIP (WITH OR WITHOUT PELVIS) 2-3V LEFT COMPARISON:  11/27/2023 FINDINGS: Interval left hip hemiarthroplasty without fracture or dislocation. Normal alignment. IMPRESSION: Left hip hemiarthroplasty without apparent complication. Electronically Signed   By: JONETTA Faes M.D.   On: 11/28/2023 12:01   DG Shoulder Left Port Result Date: 11/27/2023 CLINICAL DATA:  [id number] Left shoulder pain [id number] EXAM: LEFT SHOULDER COMPARISON:  None Available. FINDINGS: Diffuse osteopenia. Surgical clips in the left breast/axilla.No acute fracture or dislocation. Mild AC joint narrowing. Low lung volumes with streaky atelectasis in the visualized lung bases. Aortic atherosclerosis. Thoracic vertebroplasty cement noted. IMPRESSION: Osteopenia.  No acute fracture or dislocation. Electronically Signed   By: Rogelia Myers M.D.   On: 11/27/2023 20:32   DG Chest Port 1 View Result Date: 11/27/2023 CLINICAL DATA:  Fall, left hip fracture. EXAM: PORTABLE CHEST 1 VIEW COMPARISON:  Chest radiograph  04/23/2021 FINDINGS: Patient is rotated. Mild atelectasis at the left greater than right lung base. No pneumothorax or large pleural effusion. Normal heart size. Stable aortic tortuosity. Kyphoplasty within mid and lower thoracic vertebra. On limited assessment, no acute osseous findings. Left axillary surgical clips. IMPRESSION: Mild atelectasis at the left greater than right lung base. Electronically Signed   By: Andrea Gasman M.D.   On: 11/27/2023 16:57   DG Knee Complete 4 Views Left Result Date: 11/27/2023 CLINICAL DATA:  Pain after fall. Fall at [employer] picking up a watermelon. EXAM: LEFT KNEE - COMPLETE 4+ VIEW COMPARISON:  None Available. FINDINGS: The bones are subjectively under mineralized. No evidence of fracture, dislocation, or joint effusion. The alignment and joint spaces are preserved. No evidence of arthropathy or other focal bone abnormality. Soft tissues are unremarkable. IMPRESSION: 1. No fracture or subluxation of the left knee. 2. Subjective osteopenia/osteoporosis. Electronically Signed   By: Andrea Gasman M.D.   On: 11/27/2023 16:56   DG Hip Unilat W or Wo Pelvis 2-3 Views Left Result Date: 11/27/2023 CLINICAL DATA:  Pain after fall. Fall at [employer] picking up a watermelon. EXAM: DG HIP (WITH OR WITHOUT PELVIS) 2-3V LEFT COMPARISON:  None Available. FINDINGS: Displaced fracture through the femoral neck. Proximal migration of the femoral shaft with mild angulation. The femoral head remains seated. No additional fracture of the pelvis. Pubic rami are intact. Chain sutures noted in the pelvis. IMPRESSION: Displaced left femoral neck fracture. Electronically Signed   By: Andrea Gasman M.D.   On: 11/27/2023 16:55    Microbiology: No results found for this or any previous visit (from the past 240 hours).   Labs: CBC: Recent Labs  Lab 11/27/23 1559 11/28/23 0539 11/29/23 0442 11/30/23 0401 12/01/23 0448 12/02/23 0527  WBC 7.5 9.9 10.3 10.0 9.7 9.0  NEUTROABS  3.5   --   --   --   --   --   HGB 12.9 13.9 11.6* 12.0 13.3 13.6  HCT 38.8 41.0 33.8* 34.8* 38.9 40.2  MCV 87.4 84.2 82.8 83.5 83.1 83.6  PLT 253 233 199 229 267 284   Basic Metabolic Panel: Recent Labs  Lab 11/27/23 1559 11/27/23 1929 11/28/23 0539 11/29/23 0442 11/30/23 0401 12/01/23 0448 12/02/23 0527  NA 138  --  126* 126* 133* 132* 126*  K 2.6* 4.1 4.7 4.5 4.0 3.9 3.9  CL 112*  --  97* 96* 100 98 94*  CO2 21*  --  23 22 22 23 23   GLUCOSE 88  --  127* 133* 118* 115* 109*  BUN 16  --  16 12 16 14 13   CREATININE 0.62  --  0.63 0.63 0.52 0.47 0.51  CALCIUM 6.3*  --  8.7* 8.5* 8.6* 9.3 8.5*  MG 1.7  --  2.1  --  2.0 2.1 2.1  PHOS  --  3.5  --   --  2.7 2.8 3.2   Liver Function Tests: Recent Labs  Lab 11/27/23 1559  AST 21  ALT 15  ALKPHOS 38  BILITOT 0.5  PROT 3.9*  ALBUMIN  2.5*   No results for input(s): LIPASE, AMYLASE in the last 168 hours. No results for input(s): AMMONIA in the last 168 hours. Cardiac Enzymes: No results for input(s): CKTOTAL, CKMB, CKMBINDEX, TROPONINI in the last 168 hours. BNP (last 3 results) No results for input(s): BNP in the last 8760 hours. CBG: No results for input(s): GLUCAP in the last 168 hours.  Time spent: 35 minutes  Signed:  Elvan Sor  [facility] 12/02/2023 10:40 AM

## 2023-12-02 NOTE — Plan of Care (Signed)

## 2023-12-02 NOTE — TOC Transition Note (Signed)
 Transition of Care [facility]) - Discharge Note   Patient Details  Name: Mary Washington MRN: [id number] Date of Birth: 07-Oct-1945  Transition of Care [facility]) CM/SW Contact:  Quintella Suzen Jansky, RN Phone Number: 12/02/2023, 11:59 AM   Clinical Narrative:     Patient to discharge today, going to [facility]. Discharge summary and orders sent to facility via HUB. Spouse to transport to facility. Patient going to room 103, nurse to call report [phone]. Bed ready around 3pm. Notified bedside nurse    Final next level of care: Skilled Nursing Facility Barriers to Discharge: Barriers Resolved   Patient Goals and CMS Choice Patient states their goals for this hospitalization and ongoing recovery are:: get better CMS Medicare.gov Compare Post Acute Care list provided to:: Patient Choice offered to / list presented to : Patient      Discharge Placement              Patient chooses bed at: [facility] Patient to be transferred to facility by: Family Name of family member notified: Lamar Nova Patient and family notified of of transfer: 12/02/23  Discharge Plan and Services Additional resources added to the After Visit Summary for       Post Acute Care Choice: Skilled Nursing Facility            DME Agency: NA       HH Arranged: NA          Social Drivers of Health (SDOH) Interventions SDOH Screenings   Food Insecurity: No Food Insecurity (11/28/2023)  Housing: Low Risk  (11/28/2023)  Transportation Needs: No Transportation Needs (11/28/2023)  Utilities: Not At Risk (11/28/2023)  Financial Resource Strain: Low Risk  (04/21/2023)   Received from [facility]  Social Connections: Patient Declined (11/28/2023)  Tobacco Use: Low Risk  (11/28/2023)     Readmission Risk Interventions    05/01/2021    2:11 PM  Readmission Risk Prevention Plan  PCP or Specialist Appt within 3-5 Days Complete  Social Work Consult for Recovery Care Planning/Counseling  Complete  Palliative Care Screening Not Applicable

## 2023-12-03 ENCOUNTER — Encounter: Payer: Self-pay | Admitting: Nurse Practitioner

## 2023-12-03 ENCOUNTER — Non-Acute Institutional Stay (SKILLED_NURSING_FACILITY): Payer: Self-pay | Admitting: Nurse Practitioner

## 2023-12-03 DIAGNOSIS — D509 Iron deficiency anemia, unspecified: Secondary | ICD-10-CM

## 2023-12-03 DIAGNOSIS — S7292XD Unspecified fracture of left femur, subsequent encounter for closed fracture with routine healing: Secondary | ICD-10-CM | POA: Diagnosis not present

## 2023-12-03 DIAGNOSIS — K5903 Drug induced constipation: Secondary | ICD-10-CM

## 2023-12-03 DIAGNOSIS — M25512 Pain in left shoulder: Secondary | ICD-10-CM | POA: Diagnosis not present

## 2023-12-03 DIAGNOSIS — E871 Hypo-osmolality and hyponatremia: Secondary | ICD-10-CM | POA: Diagnosis not present

## 2023-12-03 DIAGNOSIS — C8581 Other specified types of non-Hodgkin lymphoma, lymph nodes of head, face, and neck: Secondary | ICD-10-CM

## 2023-12-03 NOTE — Progress Notes (Signed)
 Prescriptions found left in chart, nurse called and spoke to Duncan at [facility], she stated nurse could fax prescriptions. Faxed to [phone].

## 2023-12-03 NOTE — Progress Notes (Signed)
 Nursing Home Location:    [facility]  Place of Service: SNF (31)  PCP: Lenon Layman ORN, MD Harlene An, NP  No Known Allergies  Chief Complaint  Patient presents with   New Admit To SNF    Admission.     HPI:  Patient is a 78 y.o. female seen today at   [facility] for hospital follow up.   She was hospitalized following a fall at the grocery store, resulting in a displaced left femoral neck fracture. She underwent fracture repair surgery on November 28, 2023, and was transferred to the nursing facility on December 02, 2023. She is currently on Lovenox  40 mg daily for DVT prophylaxis, which was started in the hospital. She received a dose this morning before breakfast.  She has a history of hyponatremia, with sodium levels typically around 132-133, which dropped significantly during her hospital stay. A BMP was drawn this morning to monitor her sodium levels. She also experienced low calcium levels in the hospital, which were improving upon discharge.  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 She is not currently undergoing treatment and is scheduled to see her oncologist in August.  She reported shoulder pain and a large bruise following the fall, but an x-ray showed no fracture or dislocation. The pain has not been significant since the initial injury.  She experienced constipation during her hospital stay, which resolved after receiving laxatives and a Dulcolax suppository. She had diarrhea following the treatment but is now having soft stools. She was given Miralax  this morning and typically experiences chronic constipation.  She is on an iron tablet due to borderline anemia, with hemoglobin levels around 13. She was not on iron before hospitalization but started it there. Her iron, B12, and folate levels were tested in the hospital, with only iron being low.  Currently, she reports minimal pain and  has tramadol  available for breakthrough pain.  No chest pain, palpitations, or shortness of breath. Reports increased coughing but no shortness of breath. No swelling noted.  Review of Systems:  Review of Systems  Constitutional:  Negative for activity change, appetite change, fatigue and unexpected weight change.  HENT:  Negative for congestion and hearing loss.   Eyes: Negative.   Respiratory:  Negative for cough and shortness of breath.   Cardiovascular:  Negative for chest pain, palpitations and leg swelling.  Gastrointestinal:  Negative for abdominal pain, constipation and diarrhea.  Genitourinary:  Negative for difficulty urinating and dysuria.  Musculoskeletal:  Negative for arthralgias and myalgias.  Skin:  Negative for color change and wound.  Neurological:  Negative for dizziness and weakness.  Psychiatric/Behavioral:  Negative for agitation, behavioral problems and confusion.     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)    Melanoma (HCC) 1988   lt upper arm, some lymph nodes removed   Mild intermittent asthma  well controlled   Osteoporosis    Pneumonia 2022   Tachycardia    Past Surgical History:  Procedure Laterality Date   BACK SURGERY     CATARACT EXTRACTION     COLONOSCOPY WITH PROPOFOL  N/A 09/12/2021   Procedure: COLONOSCOPY WITH PROPOFOL ;  Surgeon: Tye Millet, DO;  Location: [facility] ENDOSCOPY;  Service: General;  Laterality: N/A;  Dr Tye can be 12:30 PM  procedure start time   HIP ARTHROPLASTY Left 11/28/2023   Procedure:  HEMIARTHROPLASTY (BIPOLAR) HIP, POSTERIOR APPROACH FOR FRACTURE;  Surgeon: Edie Norleen PARAS, MD;  Location: [facility] ORS;  Service: Orthopedics;  Laterality: Left;   IR IMAGING GUIDED PORT INSERTION  11/23/2020   IR KYPHO THORACIC WITH BONE BIOPSY  08/12/2021   IR RADIOLOGIST EVAL & MGMT  07/23/2021   IR RADIOLOGIST EVAL & MGMT  09/11/2021   IR REMOVAL TUN ACCESS W/ PORT W/O FL MOD SED  07/31/2023   LAPAROSCOPIC LYSIS OF ADHESIONS  10/21/2021   Procedure: LAPAROSCOPIC LYSIS OF ADHESIONS;  Surgeon: Rodolph Romano, MD;  Location: [facility] ORS;  Service: General;;   PARTIAL COLECTOMY N/A 04/24/2021   Procedure: PARTIAL COLECTOMY;  Surgeon: Rodolph Romano, MD;  Location: [facility] ORS;  Service: General;  Laterality: N/A;   TONSILLECTOMY     VAGINAL HYSTERECTOMY  1990   XI ROBOTIC ASSISTED COLOSTOMY TAKEDOWN N/A 10/21/2021   Procedure: XI ROBOTIC ASSISTED COLOSTOMY TAKEDOWN;  Surgeon: Rodolph Romano, MD;  Location: [facility] ORS;  Service: General;  Laterality: N/A;   Social History:   reports that she has never smoked. She has never used smokeless tobacco. She reports current alcohol use. She reports that she does not use drugs.  Family History  Problem Relation Age of Onset   Diabetes Mother    Breast cancer Neg Hx     Medications: Patient's Medications  New Prescriptions   No medications on file  Previous Medications   ACETAMINOPHEN  (TYLENOL ) 325 MG TABLET    Take 2 tablets (650 mg total) by mouth every 6 (six) hours as needed for mild pain (pain score 1-3), fever or headache.   ASCORBIC ACID (VITAMIN C) 500 MG TABLET    Take 1 tablet (500 mg total) by mouth daily.   CALCIUM CARB-CHOLECALCIFEROL 600-20 MG-MCG TABS    Take 1 tablet by mouth daily. citracal   CHOLECALCIFEROL (VITAMIN D) 50 MCG (2000 UT) CAPS    Take 2,000 Units by mouth daily.   DICLOFENAC SODIUM (VOLTAREN) 1 % GEL    Apply 2 g topically 2 (two) times daily as needed (left leg pain).   ENOXAPARIN  (LOVENOX ) 40 MG/0.4ML  INJECTION    Inject 0.4 mLs (40 mg total) into the skin daily.   FEEDING SUPPLEMENT, GLUCERNA SHAKE, (GLUCERNA SHAKE) LIQD    Take 237 mLs by mouth 3 (three) times daily between meals.   IBANDRONATE (BONIVA) 150 MG TABLET    Take 150 mg by mouth every 30 (thirty) days. Take in the morning with a full glass of water , on an empty stomach, and do not take anything else by mouth or lie down for the next 30 min.   IRON POLYSACCHARIDES (NIFEREX) 150 MG CAPSULE    Take 1 capsule (150 mg total) by mouth daily.   LIDOCAINE -PRILOCAINE  (EMLA ) CREAM    1 application. as needed (on her port).   MAGNESIUM  OXIDE (MAG-OXIDE PO)    Take 250 mg by mouth daily at 12 noon.   METOPROLOL  TARTRATE (LOPRESSOR ) 25 MG TABLET    Take 1 tablet (25 mg  total) by mouth 2 (two) times daily. Use 12.5 mg p.o. twice daily if blood pressure remains soft our prescription that was if blood pressure is low. Hold if SBP <110 mmHg and or HR <65   MULTIPLE VITAMINS-MINERALS (WOMENS MULTIVITAMIN PO)    Take 1 Tera Vector [employer]omes by mouth daily.   POLYETHYLENE GLYCOL (MIRALAX  / GLYCOLAX ) 17 G PACKET    Take 17 g by mouth daily.   TRAMADOL  (ULTRAM ) 50 MG TABLET    Take 1 tablet (50 mg total) by mouth every 8 (eight) hours as needed (breakthrough pain.).  Modified Medications   No medications on file  Discontinued Medications   No medications on file     Physical Exam: Vitals:   12/03/23 0812  BP: 139/72  Pulse: 77  Resp: 18  Temp: (!) 97.3 F (36.3 C)  SpO2: 97%  Weight: 127 lb 12.8 oz (58 kg)  Height: 5' (1.524 m)    Physical Exam Constitutional:      General: She is not in acute distress.    Appearance: She is well-developed. She is not diaphoretic.  HENT:     Head: Normocephalic and atraumatic.     Mouth/Throat:     Pharynx: No oropharyngeal exudate.  Eyes:     Conjunctiva/sclera: Conjunctivae normal.     Pupils: Pupils are equal, round, and reactive to light.  Cardiovascular:     Rate and Rhythm: Normal rate and  regular rhythm.     Heart sounds: Normal heart sounds.  Pulmonary:     Effort: Pulmonary effort is normal.     Breath sounds: Normal breath sounds.  Abdominal:     General: Bowel sounds are normal.     Palpations: Abdomen is soft.  Musculoskeletal:     Cervical back: Normal range of motion and neck supple.     Right lower leg: No edema.     Left lower leg: No edema.  Skin:    General: Skin is warm and dry.  Neurological:     Mental Status: She is alert.  Psychiatric:        Mood and Affect: Mood normal.     Labs reviewed: Basic Metabolic Panel: Recent Labs    11/30/23 0401 12/01/23 0448 12/02/23 0527  NA 133* 132* 126*  K 4.0 3.9 3.9  CL 100 98 94*  CO2 22 23 23   GLUCOSE 118* 115* 109*  BUN 16 14 13   CREATININE 0.52 0.47 0.51  CALCIUM 8.6* 9.3 8.5*  MG 2.0 2.1 2.1  PHOS 2.7 2.8 3.2   Liver Function Tests: Recent Labs    01/02/23 1413 07/13/23 1027 11/27/23 1559  AST 22 21 21   ALT 15 14 15   ALKPHOS 75 67 38  BILITOT 0.7 0.6 0.5  PROT 6.4* 6.1* 3.9*  ALBUMIN  3.9 3.8 2.5*   No results for input(s): LIPASE, AMYLASE in the last 8760 hours. No results for input(s): AMMONIA in the last 8760 hours. CBC: Recent Labs    01/02/23 1413 07/13/23 1026 11/27/23 1559 11/28/23 0539 11/30/23 0401 12/01/23 0448 12/02/23 0527  WBC 7.0 6.4 7.5   < > 10.0 9.7 9.0  NEUTROABS 3.4 3.5 3.5  --   --   --   --   HGB 15.0 15.0 12.9   < > 12.0 13.3 13.6  HCT 44.3 44.7 38.8   < > 34.8* 38.9 40.2  MCV 84.4 85.8 87.4   < > 83.5 83.1 83.6  PLT 266 235 253   < > 229  267 284   < > = values in this interval not displayed.   TSH: Recent Labs    11/29/23 0441  TSH 0.930   A1C: Lab Results  Component Value Date   HGBA1C 5.8 (H) 04/11/2022   Lipid Panel: No results for input(s): CHOL, HDL, LDLCALC, TRIG, CHOLHDL, LDLDIRECT in the last 8760 hours.  Radiological Exams: DG HIP UNILAT W OR W/O PELVIS 2-3 VIEWS LEFT Result Date: 11/28/2023 CLINICAL DATA:   [id number] Status post hip hemiarthroplasty [id number] EXAM: DG HIP (WITH OR WITHOUT PELVIS) 2-3V LEFT COMPARISON:  11/27/2023 FINDINGS: Interval left hip hemiarthroplasty without fracture or dislocation. Normal alignment. IMPRESSION: Left hip hemiarthroplasty without apparent complication. Electronically Signed   By: JONETTA Faes M.D.   On: 11/28/2023 12:01   DG Shoulder Left Port Result Date: 11/27/2023 CLINICAL DATA:  [id number] Left shoulder pain [id number] EXAM: LEFT SHOULDER COMPARISON:  None Available. FINDINGS: Diffuse osteopenia. Surgical clips in the left breast/axilla.No acute fracture or dislocation. Mild AC joint narrowing. Low lung volumes with streaky atelectasis in the visualized lung bases. Aortic atherosclerosis. Thoracic vertebroplasty cement noted. IMPRESSION: Osteopenia.  No acute fracture or dislocation. Electronically Signed   By: Rogelia Myers M.D.   On: 11/27/2023 20:32   DG Chest Port 1 View Result Date: 11/27/2023 CLINICAL DATA:  Fall, left hip fracture. EXAM: PORTABLE CHEST 1 VIEW COMPARISON:  Chest radiograph 04/23/2021 FINDINGS: Patient is rotated. Mild atelectasis at the left greater than right lung base. No pneumothorax or large pleural effusion. Normal heart size. Stable aortic tortuosity. Kyphoplasty within mid and lower thoracic vertebra. On limited assessment, no acute osseous findings. Left axillary surgical clips. IMPRESSION: Mild atelectasis at the left greater than right lung base. Electronically Signed   By: Andrea Gasman M.D.   On: 11/27/2023 16:57   DG Knee Complete 4 Views Left Result Date: 11/27/2023 CLINICAL DATA:  Pain after fall. Fall at [employer] picking up a watermelon. EXAM: LEFT KNEE - COMPLETE 4+ VIEW COMPARISON:  None Available. FINDINGS: The bones are subjectively under mineralized. No evidence of fracture, dislocation, or joint effusion. The alignment and joint spaces are preserved. No evidence of arthropathy or other focal bone abnormality. Soft tissues are  unremarkable. IMPRESSION: 1. No fracture or subluxation of the left knee. 2. Subjective osteopenia/osteoporosis. Electronically Signed   By: Andrea Gasman M.D.   On: 11/27/2023 16:56   DG Hip Unilat W or Wo Pelvis 2-3 Views Left Result Date: 11/27/2023 CLINICAL DATA:  Pain after fall. Fall at [employer] picking up a watermelon. EXAM: DG HIP (WITH OR WITHOUT PELVIS) 2-3V LEFT COMPARISON:  None Available. FINDINGS: Displaced fracture through the femoral neck. Proximal migration of the femoral shaft with mild angulation. The femoral head remains seated. No additional fracture of the pelvis. Pubic rami are intact. Chain sutures noted in the pelvis. IMPRESSION: Displaced left femoral neck fracture. Electronically Signed   By: Andrea Gasman M.D.   On: 11/27/2023 16:55     Assessment/Plan 1. Closed fracture of left femur with routine healing, unspecified fracture morphology, unspecified portion of femur, subsequent encounter (Primary) S/p repair, doing well with therapy, pain is well controlled.  2. Drug-induced constipation -improved at this time, continues miralax  daily   3. Hyponatremia Chronic, follow up lab pending  4. Acute pain of left shoulder Has improved at this time  5. 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 Iron deficiency anemia, unspecified iron deficiency anemia type  Continue iron at this time.      Arlesia Kiel K. Caro BODILY  [facility] [phone] 8 am - 5 pm) [phone] (after hours)

## 2023-12-09 ENCOUNTER — Other Ambulatory Visit: Payer: Self-pay | Admitting: Adult Health

## 2023-12-09 DIAGNOSIS — M25512 Pain in left shoulder: Secondary | ICD-10-CM

## 2023-12-09 MED ORDER — TRAMADOL HCL 50 MG PO TABS: 50.0000 mg | ORAL_TABLET | Freq: Three times a day (TID) | ORAL | 0 refills | Status: DC | PRN

## 2023-12-14 ENCOUNTER — Encounter: Payer: Self-pay | Admitting: Student

## 2023-12-14 ENCOUNTER — Non-Acute Institutional Stay (SKILLED_NURSING_FACILITY): Payer: Self-pay | Admitting: Student

## 2023-12-14 DIAGNOSIS — R7303 Prediabetes: Secondary | ICD-10-CM

## 2023-12-14 DIAGNOSIS — C8581 Other specified types of non-Hodgkin lymphoma, lymph nodes of head, face, and neck: Secondary | ICD-10-CM | POA: Diagnosis not present

## 2023-12-14 DIAGNOSIS — S72032D Displaced midcervical fracture of left femur, subsequent encounter for closed fracture with routine healing: Secondary | ICD-10-CM

## 2023-12-14 DIAGNOSIS — M8000XD Age-related osteoporosis with current pathological fracture, unspecified site, subsequent encounter for fracture with routine healing: Secondary | ICD-10-CM

## 2023-12-14 NOTE — Progress Notes (Signed)
 Provider:  Dr. Richerd Brigham Location:  Other [facility].  Nursing Home Room Number: [facility] Place of Service:  SNF ([phone])  PCP: Lenon Layman ORN, MD Patient Care Team: Lenon Layman ORN, MD as PCP - General (Internal Medicine) Rennie Cindy SAUNDERS, MD as Consulting Physician (Oncology)  Extended Emergency Contact Information Primary Emergency Contact: Massoud,Robert Address: [address]          [place], [place] [address] [place] Mobile Phone: [phone] Relation: Spouse Secondary Emergency Contact: Rash,Michelle Address: [address]          [place], [place] [address] [place] Work Phone: [phone] Mobile Phone: [phone] Relation: Daughter  Code Status: Full Code Goals of Care: Advanced Directive information    12/14/2023    9:41 AM  Advanced Directives  Does Patient Have a Medical Advance Directive? No  Does patient want to make changes to medical advance directive? No - Patient declined      Chief Complaint  Patient presents with   New Admit To SNF    Admission.     HPI: Patient is a 78 y.o. female seen today for admission to [facility]  History of Present Illness The patient presents for follow-up after a hip fracture 12/01/2023 and surgery. She is accompanied by her husband.  She experienced a fall while grocery shopping, resulting in a hip fracture. The fall occurred when she bent over to pick up a watermelon and twisted to place it in her cart. She underwent surgery for the hip fracture two weeks ago.  Post-surgery, her hip pain has been minimal, but she experiences more discomfort in her knee, which has been bothersome for a couple of years. She has been using a walker for long distances since her cancer diagnosis in 2022, which initially left her with significant mobility issues. She does not use the walker around the house.  Her current medications include metoprolol  for blood pressure  management and Boniva once a month. She has been on Lovenox  injections to prevent blood clots post-surgery, with two more days of injections remaining. She has not taken tramadol  for pain since her hospital stay.  She has a history of cancer diagnosed in 2022, which significantly impacted her mobility at the time. She has follow-up appointments scheduled with her oncologist and the surgeon's PA in August.  She has been active, walking and moving around, and does not spend extended periods in bed.   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)    Melanoma (HCC) 1988   lt upper arm, some lymph nodes removed   Mild intermittent asthma    well controlled   Osteoporosis    Pneumonia 2022   Tachycardia    Past Surgical History:  Procedure Laterality Date   BACK SURGERY     CATARACT EXTRACTION     COLONOSCOPY WITH PROPOFOL  N/A 09/12/2021   Procedure: COLONOSCOPY WITH PROPOFOL ;  Surgeon: Tye Millet, DO;  Location: [facility] ENDOSCOPY;  Service: General;  Laterality: N/A;  Dr Tye can be 12:30 PM  procedure start time   HIP ARTHROPLASTY Left 11/28/2023   Procedure: HEMIARTHROPLASTY (BIPOLAR) HIP, POSTERIOR APPROACH FOR FRACTURE;  Surgeon: Edie Norleen PARAS, MD;  Location: [facility] ORS;  Service: Orthopedics;  Laterality: Left;   IR IMAGING GUIDED PORT INSERTION  11/23/2020   IR KYPHO THORACIC WITH BONE BIOPSY  08/12/2021   IR RADIOLOGIST EVAL & MGMT  07/23/2021   IR RADIOLOGIST EVAL & MGMT  09/11/2021   IR REMOVAL TUN ACCESS W/ PORT W/O FL MOD SED  07/31/2023   LAPAROSCOPIC LYSIS OF  ADHESIONS  10/21/2021   Procedure: LAPAROSCOPIC LYSIS OF ADHESIONS;  Surgeon: Rodolph Romano, MD;  Location: [facility] ORS;  Service: General;;   PARTIAL COLECTOMY N/A 04/24/2021   Procedure: PARTIAL COLECTOMY;  Surgeon: Rodolph Romano, MD;  Location: [facility] ORS;  Service: General;  Laterality: N/A;   TONSILLECTOMY     VAGINAL HYSTERECTOMY  1990   XI ROBOTIC ASSISTED COLOSTOMY TAKEDOWN N/A 10/21/2021   Procedure: XI ROBOTIC ASSISTED COLOSTOMY TAKEDOWN;  Surgeon: Rodolph Romano, MD;  Location: [facility] ORS;  Service: General;  Laterality: N/A;    reports that she has never smoked. She has never used smokeless tobacco. She reports current alcohol use. She reports that she does not use drugs. Social History   Socioeconomic History   Marital status: Married    Spouse name: Octaviano   Number of children: 2   Years of education: Not on file   Highest education level: Not on file  Occupational History   Not on file  Tobacco Use   Smoking status: Never   Smokeless tobacco: Never  Vaping Use   Vaping status: Never Used  Substance and Sexual Activity   Alcohol use: Yes    Comment: rare   Drug use: Never   Sexual activity: Yes    Partners: Male  Other Topics Concern   Not on file  Social History Narrative   ** Merged History Encounter **       Lives at home with husband.    Social Drivers of Corporate investment banker Strain: Low Risk  (04/21/2023)   Received from [facility]   Overall Financial Resource Strain (CARDIA)    Difficulty of Paying Living Expenses: Not hard at all  Food Insecurity: No Food Insecurity (11/28/2023)   Hunger Vital Sign    Worried About Running Out of Food in the Last Year: Never true    Ran Out of Food in the Last Year: Never true  Transportation Needs: No Transportation Needs (11/28/2023)   PRAPARE - Administrator, Civil Service (Medical): No    Lack of Transportation (Non-Medical): No  Physical Activity: Not on file   Stress: Not on file  Social Connections: Patient Declined (11/28/2023)   Social Connection and Isolation Panel    Frequency of Communication with Friends and Family: Patient declined    Frequency of Social Gatherings with Friends and Family: Patient declined    Attends Religious Services: Patient declined    Database administrator or Organizations: Patient declined    Attends Banker Meetings: Patient declined    Marital Status: Patient declined  Intimate Partner Violence: Not At Risk (11/28/2023)   Humiliation, Afraid, Rape, and Kick questionnaire    Fear of Current or Ex-Partner: No    Emotionally Abused: No    Physically Abused: No    Sexually Abused: No  Functional Status Survey:    Family History  Problem Relation Age of Onset   Diabetes Mother    Breast cancer Neg Hx     Health Maintenance  Topic Date Due   DTaP/Tdap/Td (1 - Tdap) Never done   DEXA SCAN  Never done   Medicare Annual Wellness (AWV)  02/17/2017   Pneumococcal Vaccine: 50+ Years (2 of 2 - PPSV23, PCV20, or PCV21) 03/16/2017   COVID-19 Vaccine (5 - Pfizer risk 2024-25 season) 10/26/2023   INFLUENZA VACCINE  01/01/2024   Hepatitis C Screening  Completed   Zoster Vaccines- Shingrix  Completed   Hepatitis B Vaccines  Aged Out   HPV VACCINES  Aged Out   Meningococcal B Vaccine  Aged Out   Colonoscopy  Discontinued    No Known Allergies  Outpatient Encounter Medications as of 12/14/2023  Medication Sig   acetaminophen  (TYLENOL ) 325 MG tablet Take 2 tablets (650 mg total) by mouth every 6 (six) hours as needed for mild pain (pain score 1-3), fever or headache.   ascorbic acid  (VITAMIN C ) 500 MG tablet Take 1 tablet (500 mg total) by mouth daily.   Calcium  Carb-Cholecalciferol  600-20 MG-MCG TABS Take 1 tablet by mouth daily. citracal   Cholecalciferol  (VITAMIN D ) 50 MCG (2000 UT) CAPS Take 2,000 Units by mouth daily.   diclofenac  Sodium (VOLTAREN ) 1 % GEL Apply 2 g topically 2 (two) times  daily as needed (left leg pain).   enoxaparin  (LOVENOX ) 40 MG/0.4ML injection Inject 0.4 mLs (40 mg total) into the skin daily.   feeding supplement, GLUCERNA SHAKE, (GLUCERNA SHAKE) LIQD Take 237 mLs by mouth 3 (three) times daily between meals.   ibandronate (BONIVA) 150 MG tablet Take 150 mg by mouth every 30 (thirty) days. Take in the morning with a full glass of water , on an empty stomach, and do not take anything else by mouth or lie down for the next 30 min.   iron  polysaccharides (NIFEREX) 150 MG capsule Take 1 capsule (150 mg total) by mouth daily.   lidocaine -prilocaine  (EMLA ) cream 1 application. as needed (on her port).   Magnesium  Oxide (MAG-OXIDE PO) Take 250 mg by mouth daily at 12 noon.   metoprolol  tartrate (LOPRESSOR ) 25 MG tablet Take 1 tablet (25 mg total) by mouth 2 (two) times daily. Use 12.5 mg p.o. twice daily if blood pressure remains soft our prescription that was if blood pressure is low. Hold if SBP <110 mmHg and or HR <65   Multiple Vitamins-Minerals (WOMENS MULTIVITAMIN PO) Take 1 Tera Vector Genomes by mouth daily.   polyethylene glycol (MIRALAX  / GLYCOLAX ) 17 g packet Take 17 g by mouth daily.   traMADol  (ULTRAM ) 50 MG tablet Take 1 tablet (50 mg total) by mouth every 8 (eight) hours as needed (breakthrough pain.).   [DISCONTINUED] loratadine (CLARITIN) 10 MG tablet Take 1 tablet by mouth as needed.   Facility-Administered Encounter Medications as of 12/14/2023  Medication   sodium chloride  flush (NS) 0.9 % injection 10 mL    Review of Systems  Vitals:   12/14/23 0935  BP: 118/71  Pulse: 98  Resp: 18  Temp: 97.6 F (36.4 C)  SpO2: 96%  Weight: 126 lb 9.6 oz (57.4 kg)  Height: 5' (1.524 m)   Body mass index is 24.72 kg/m. Physical Exam  Labs reviewed: Basic Metabolic Panel: Recent Labs    11/30/23 0401 12/01/23 0448 12/02/23 0527  NA 133* 132* 126*  K 4.0 3.9 3.9  CL 100 98 94*  CO2 22 23 23   GLUCOSE 118* 115* 109*  BUN 16 14 13    CREATININE 0.52 0.47 0.51  CALCIUM  8.6* 9.3 8.5*  MG 2.0 2.1 2.1  PHOS 2.7 2.8 3.2   Liver Function Tests: Recent Labs    01/02/23 1413 07/13/23 1027 11/27/23 1559  AST 22 21 21   ALT 15 14 15   ALKPHOS 75 67 38  BILITOT 0.7 0.6 0.5  PROT 6.4* 6.1* 3.9*  ALBUMIN  3.9 3.8 2.5*   No results for input(s): LIPASE, AMYLASE in the last 8760 hours. No results for input(s): AMMONIA in the last 8760 hours. CBC: Recent Labs    01/02/23 1413 07/13/23 1026 11/27/23 1559 11/28/23 0539 11/30/23 0401 12/01/23 0448 12/02/23 0527  WBC 7.0 6.4 7.5   < > 10.0 9.7 9.0  NEUTROABS 3.4 3.5 3.5  --   --   --   --   HGB 15.0 15.0 12.9   < > 12.0 13.3 13.6  HCT 44.3 44.7 38.8   < > 34.8* 38.9 40.2  MCV 84.4 85.8 87.4   < > 83.5 83.1 83.6  PLT 266 235 253   < > 229 267 284   < > = values in this interval not displayed.   Cardiac Enzymes: No results for input(s): CKTOTAL, CKMB, CKMBINDEX, TROPONINI in the last 8760 hours. BNP: Invalid input(s): POCBNP Lab Results  Component Value Date   HGBA1C 5.8 (H) 04/11/2022   Lab Results  Component Value Date   TSH 0.930 11/29/2023   Lab Results  Component Value Date   VITAMINB12 567 11/29/2023   Lab Results  Component Value Date   FOLATE 17.6 11/29/2023   Lab Results  Component Value Date   IRON  26 (L) 11/29/2023   TIBC 245 (L) 11/29/2023    Imaging and Procedures obtained prior to SNF admission: DG HIP UNILAT W OR W/O PELVIS 2-3 VIEWS LEFT Result Date: 11/28/2023 CLINICAL DATA:  [id number] Status post hip hemiarthroplasty [id number] EXAM: DG HIP (WITH OR WITHOUT PELVIS) 2-3V LEFT COMPARISON:  11/27/2023 FINDINGS: Interval left hip hemiarthroplasty without fracture or dislocation. Normal alignment. IMPRESSION: Left hip hemiarthroplasty without apparent complication. Electronically Signed   By: JONETTA Faes M.D.   On: 11/28/2023 12:01   DG Shoulder Left Port Result Date: 11/27/2023 CLINICAL DATA:  [id number] Left shoulder pain [id number]  EXAM: LEFT SHOULDER COMPARISON:  None Available. FINDINGS: Diffuse osteopenia. Surgical clips in the left breast/axilla.No acute fracture or dislocation. Mild AC joint narrowing. Low lung volumes with streaky atelectasis in the visualized lung bases. Aortic atherosclerosis. Thoracic vertebroplasty cement noted. IMPRESSION: Osteopenia.  No acute fracture or dislocation. Electronically Signed   By: Rogelia Myers M.D.   On: 11/27/2023 20:32   DG Chest Port 1 View Result Date: 11/27/2023 CLINICAL DATA:  Fall, left hip fracture. EXAM: PORTABLE CHEST 1 VIEW COMPARISON:  Chest radiograph 04/23/2021 FINDINGS: Patient is rotated. Mild atelectasis at the left greater than right lung base. No pneumothorax or large pleural effusion. Normal heart size. Stable aortic tortuosity. Kyphoplasty within mid and lower thoracic vertebra. On limited assessment, no acute osseous findings. Left axillary surgical clips. IMPRESSION: Mild atelectasis at the left greater than right lung base. Electronically Signed   By: Andrea Gasman M.D.   On: 11/27/2023 16:57   DG Knee Complete 4 Views Left Result Date: 11/27/2023 CLINICAL DATA:  Pain after fall. Fall at [employer] picking up a watermelon. EXAM: LEFT KNEE - COMPLETE 4+ VIEW COMPARISON:  None Available. FINDINGS: The bones are subjectively under  mineralized. No evidence of fracture, dislocation, or joint effusion. The alignment and joint spaces are preserved. No evidence of arthropathy or other focal bone abnormality. Soft tissues are unremarkable. IMPRESSION: 1. No fracture or subluxation of the left knee. 2. Subjective osteopenia/osteoporosis. Electronically Signed   By: Andrea Gasman M.D.   On: 11/27/2023 16:56   DG Hip Unilat W or Wo Pelvis 2-3 Views Left Result Date: 11/27/2023 CLINICAL DATA:  Pain after fall. Fall at [employer] picking up a watermelon. EXAM: DG HIP (WITH OR WITHOUT PELVIS) 2-3V LEFT COMPARISON:  None Available. FINDINGS: Displaced fracture through the  femoral neck. Proximal migration of the femoral shaft with mild angulation. The femoral head remains seated. No additional fracture of the pelvis. Pubic rami are intact. Chain sutures noted in the pelvis. IMPRESSION: Displaced left femoral neck fracture. Electronically Signed   By: Andrea Gasman M.D.   On: 11/27/2023 16:55    Assessment/Plan Hip fracture Status post fall resulting in hip fracture, likely due to osteoporosis. Recovery progressing well with minimal pain. - Remove stitches before discharge - Follow-up with surgeon's PA on August 12  Post-operative care Post-operative care includes prevention of blood clots with Lovenox . Discussion about completing a full 14-day course of Lovenox  post-discharge. She expressed discomfort with self-administration of injections. - Consult with surgeon regarding discontinuation of Lovenox  - Ensure she understands wound care instructions - Send 14 days of medications as a precaution, excluding Boniva and metoprolol   Osteoporosis Contributing factor to hip fracture. Managed with Boniva. - Continue Boniva as prescribed  Low blood pressure Blood pressure low, leading to a decrease in metoprolol  dosage. No acute issues reported. - Decrease metoprolol  dosage - Follow-up with primary care provider within 14 days of discharge  Cancer Cancer in 2022, initially affected mobility. Currently in follow-up with oncologist. - Follow-up with oncologist on August 11  Family/ staff Communication: spouse  Labs/tests ordered:none

## 2023-12-14 NOTE — Progress Notes (Signed)
 Location:  Other Nursing Home Room Number: [facility] Place of Service:  SNF [phone])  Provider: Abdul  PCP: Lenon Layman ORN, MD Patient Care Team: Lenon Layman ORN, MD as PCP - General (Internal Medicine) Rennie Cindy SAUNDERS, MD as Consulting Physician (Oncology)  Extended Emergency Contact Information Primary Emergency Contact: Spiering,Robert Address: [address]          [place], [place] [address] [place] Mobile Phone: [phone] Relation: Spouse Secondary Emergency Contact: Rash,Michelle Address: [address]          [place], [place] [address] [place] Work Phone: [phone] Mobile Phone: [phone] Relation: Daughter  Code Status: Full Code Goals of care:  Advanced Directive information    12/14/2023    9:41 AM  Advanced Directives  Does Patient Have a Medical Advance Directive? No  Does patient want to make changes to medical advance directive? No - Patient declined     No Known Allergies  Chief Complaint  Patient presents with   New Admit To SNF    Admission.     HPI:  78 y.o. female   The patient presents for follow-up after a hip fracture and surgery. She is accompanied by her husband.  She experienced a fall while grocery shopping, resulting in a hip fracture. The fall occurred when she bent over to pick up a watermelon and twisted to place it in her cart. She underwent surgery for the hip fracture two weeks ago.  Post-surgery, her hip pain has been minimal, but she experiences more discomfort in her knee, which has been bothersome for a couple of years. She has been using a walker for long distances since her cancer diagnosis in 2022, which initially left her with significant mobility issues. She does not use the walker around the house.  She is planning to discharge today. Would like to stop injections of lovenox  prior to discharge.     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)    Melanoma (HCC) 1988   lt upper arm, some lymph nodes removed   Mild intermittent asthma    well controlled   Osteoporosis    Pneumonia 2022   Tachycardia     Past Surgical History:  Procedure Laterality Date   BACK SURGERY     CATARACT EXTRACTION     COLONOSCOPY WITH PROPOFOL  N/A 09/12/2021   Procedure: COLONOSCOPY WITH PROPOFOL ;  Surgeon: Tye Millet, DO;  Location: [facility] ENDOSCOPY;  Service: General;  Laterality: N/A;  Dr Tye can be 12:30 PM  procedure start time   HIP ARTHROPLASTY Left 11/28/2023   Procedure: HEMIARTHROPLASTY (BIPOLAR) HIP, POSTERIOR APPROACH FOR FRACTURE;  Surgeon: Edie Norleen PARAS, MD;  Location: [facility] ORS;  Service: Orthopedics;  Laterality: Left;   IR IMAGING GUIDED PORT INSERTION  11/23/2020   IR KYPHO THORACIC WITH BONE BIOPSY  08/12/2021   IR RADIOLOGIST EVAL & MGMT  07/23/2021   IR RADIOLOGIST EVAL & MGMT  09/11/2021  IR REMOVAL TUN ACCESS W/ PORT W/O FL MOD SED  07/31/2023   LAPAROSCOPIC LYSIS OF ADHESIONS  10/21/2021   Procedure: LAPAROSCOPIC LYSIS OF ADHESIONS;  Surgeon: Rodolph Romano, MD;  Location: [facility] ORS;  Service: General;;   PARTIAL COLECTOMY N/A 04/24/2021   Procedure: PARTIAL COLECTOMY;  Surgeon: Rodolph Romano, MD;  Location: [facility] ORS;  Service: General;  Laterality: N/A;   TONSILLECTOMY     VAGINAL HYSTERECTOMY  1990   XI ROBOTIC ASSISTED COLOSTOMY TAKEDOWN N/A 10/21/2021   Procedure: XI ROBOTIC ASSISTED COLOSTOMY TAKEDOWN;  Surgeon: Rodolph Romano, MD;  Location: [facility] ORS;  Service: General;  Laterality: N/A;      reports  that she has never smoked. She has never used smokeless tobacco. She reports current alcohol use. She reports that she does not use drugs. Social History   Socioeconomic History   Marital status: Married    Spouse name: Octaviano   Number of children: 2   Years of education: Not on file   Highest education level: Not on file  Occupational History   Not on file  Tobacco Use   Smoking status: Never   Smokeless tobacco: Never  Vaping Use   Vaping status: Never Used  Substance and Sexual Activity   Alcohol use: Yes    Comment: rare   Drug use: Never   Sexual activity: Yes    Partners: Male  Other Topics Concern   Not on file  Social History Narrative   ** Merged History Encounter **       Lives at home with husband.    Social Drivers of Corporate investment banker Strain: Low Risk  (04/21/2023)   Received from [facility]   Overall Financial Resource Strain (CARDIA)    Difficulty of Paying Living Expenses: Not hard at all  Food Insecurity: No Food Insecurity (11/28/2023)   Hunger Vital Sign    Worried About Running Out of Food in the Last Year: Never true    Ran Out of Food in the Last Year: Never true  Transportation Needs: No Transportation Needs (11/28/2023)   PRAPARE - Administrator, Civil Service (Medical): No    Lack of Transportation (Non-Medical): No  Physical Activity: Not on file  Stress: Not on file  Social Connections: Patient Declined (11/28/2023)   Social Connection and Isolation Panel    Frequency of Communication with Friends and Family: Patient declined    Frequency of Social Gatherings with Friends and Family: Patient declined    Attends Religious Services: Patient declined    Database administrator or Organizations: Patient declined    Attends Banker Meetings: Patient declined    Marital Status: Patient declined  Intimate Partner Violence: Not At Risk (11/28/2023)   Humiliation, Afraid, Rape, and Kick questionnaire     Fear of Current or Ex-Partner: No    Emotionally Abused: No    Physically Abused: No    Sexually Abused: No   Functional Status Survey:    No Known Allergies  Pertinent  Health Maintenance Due  Topic Date Due   DEXA SCAN  Never done   INFLUENZA VACCINE  01/01/2024   Colonoscopy  Discontinued    Medications: Outpatient Encounter Medications as of 12/14/2023  Medication Sig   acetaminophen  (TYLENOL ) 325 MG tablet Take 2 tablets (650 mg total) by mouth every 6 (six) hours as needed for mild pain (pain score 1-3), fever or headache.   ascorbic acid  (VITAMIN C ) 500 MG tablet  Take 1 tablet (500 mg total) by mouth daily.   Calcium  Carb-Cholecalciferol  600-20 MG-MCG TABS Take 1 tablet by mouth daily. citracal   Cholecalciferol  (VITAMIN D ) 50 MCG (2000 UT) CAPS Take 2,000 Units by mouth daily.   diclofenac  Sodium (VOLTAREN ) 1 % GEL Apply 2 g topically 2 (two) times daily as needed (left leg pain).   feeding supplement, GLUCERNA SHAKE, (GLUCERNA SHAKE) LIQD Take 237 mLs by mouth 3 (three) times daily between meals.   ibandronate (BONIVA) 150 MG tablet Take 150 mg by mouth every 30 (thirty) days. Take in the morning with a full glass of water , on an empty stomach, and do not take anything else by mouth or lie down for the next 30 min.   iron  polysaccharides (NIFEREX) 150 MG capsule Take 1 capsule (150 mg total) by mouth daily.   lidocaine -prilocaine  (EMLA ) cream 1 application. as needed (on her port).   Magnesium  Oxide (MAG-OXIDE PO) Take 250 mg by mouth daily at 12 noon.   metoprolol  tartrate (LOPRESSOR ) 25 MG tablet Take 1 tablet (25 mg total) by mouth 2 (two) times daily. Use 12.5 mg p.o. twice daily if blood pressure remains soft our prescription that was if blood pressure is low. Hold if SBP <110 mmHg and or HR <65   Multiple Vitamins-Minerals (WOMENS MULTIVITAMIN PO) Take 1 Tera Vector Genomes by mouth daily.   polyethylene glycol (MIRALAX  / GLYCOLAX ) 17 g packet Take 17 g by mouth  daily.   [DISCONTINUED] enoxaparin  (LOVENOX ) 40 MG/0.4ML injection Inject 0.4 mLs (40 mg total) into the skin daily.   [DISCONTINUED] traMADol  (ULTRAM ) 50 MG tablet Take 1 tablet (50 mg total) by mouth every 8 (eight) hours as needed (breakthrough pain.).   [DISCONTINUED] loratadine (CLARITIN) 10 MG tablet Take 1 tablet by mouth as needed.   Facility-Administered Encounter Medications as of 12/14/2023  Medication   sodium chloride  flush (NS) 0.9 % injection 10 mL    Review of Systems  Vitals:   12/14/23 0935  BP: 118/71  Pulse: 98  Resp: 18  Temp: 97.6 F (36.4 C)  SpO2: 96%  Weight: 126 lb 9.6 oz (57.4 kg)  Height: 5' (1.524 m)   Body mass index is 24.72 kg/m. Physical Exam Constitutional:      Appearance: Normal appearance.  Cardiovascular:     Rate and Rhythm: Normal rate and regular rhythm.     Pulses: Normal pulses.     Heart sounds: Normal heart sounds.  Pulmonary:     Effort: Pulmonary effort is normal.  Abdominal:     General: Abdomen is flat. Bowel sounds are normal.     Palpations: Abdomen is soft.  Musculoskeletal:        General: No swelling.     Comments: Minimal bruising. Bandage clean, dry, intact  Skin:    General: Skin is warm and dry.  Neurological:     Mental Status: She is alert and oriented to person, place, and time.     Gait: Gait normal.  Psychiatric:        Mood and Affect: Mood normal.     Labs reviewed: Basic Metabolic Panel: Recent Labs    11/30/23 0401 12/01/23 0448 12/02/23 0527  NA 133* 132* 126*  K 4.0 3.9 3.9  CL 100 98 94*  CO2 22 23 23   GLUCOSE 118* 115* 109*  BUN 16 14 13   CREATININE 0.52 0.47 0.51  CALCIUM  8.6* 9.3 8.5*  MG 2.0 2.1 2.1  PHOS 2.7 2.8 3.2   Liver  Function Tests: Recent Labs    01/02/23 1413 07/13/23 1027 11/27/23 1559  AST 22 21 21   ALT 15 14 15   ALKPHOS 75 67 38  BILITOT 0.7 0.6 0.5  PROT 6.4* 6.1* 3.9*  ALBUMIN  3.9 3.8 2.5*   No results for input(s): LIPASE, AMYLASE in the last  8760 hours. No results for input(s): AMMONIA in the last 8760 hours. CBC: Recent Labs    01/02/23 1413 07/13/23 1026 11/27/23 1559 11/28/23 0539 11/30/23 0401 12/01/23 0448 12/02/23 0527  WBC 7.0 6.4 7.5   < > 10.0 9.7 9.0  NEUTROABS 3.4 3.5 3.5  --   --   --   --   HGB 15.0 15.0 12.9   < > 12.0 13.3 13.6  HCT 44.3 44.7 38.8   < > 34.8* 38.9 40.2  MCV 84.4 85.8 87.4   < > 83.5 83.1 83.6  PLT 266 235 253   < > 229 267 284   < > = values in this interval not displayed.   Cardiac Enzymes: No results for input(s): CKTOTAL, CKMB, CKMBINDEX, TROPONINI in the last 8760 hours. BNP: Invalid input(s): POCBNP CBG: No results for input(s): GLUCAP in the last 8760 hours.  Procedures and Imaging Studies During Stay: DG HIP UNILAT W OR W/O PELVIS 2-3 VIEWS LEFT Result Date: 11/28/2023 CLINICAL DATA:  [id number] Status post hip hemiarthroplasty [id number] EXAM: DG HIP (WITH OR WITHOUT PELVIS) 2-3V LEFT COMPARISON:  11/27/2023 FINDINGS: Interval left hip hemiarthroplasty without fracture or dislocation. Normal alignment. IMPRESSION: Left hip hemiarthroplasty without apparent complication. Electronically Signed   By: JONETTA Faes M.D.   On: 11/28/2023 12:01   DG Shoulder Left Port Result Date: 11/27/2023 CLINICAL DATA:  [id number] Left shoulder pain [id number] EXAM: LEFT SHOULDER COMPARISON:  None Available. FINDINGS: Diffuse osteopenia. Surgical clips in the left breast/axilla.No acute fracture or dislocation. Mild AC joint narrowing. Low lung volumes with streaky atelectasis in the visualized lung bases. Aortic atherosclerosis. Thoracic vertebroplasty cement noted. IMPRESSION: Osteopenia.  No acute fracture or dislocation. Electronically Signed   By: Rogelia Myers M.D.   On: 11/27/2023 20:32   DG Chest Port 1 View Result Date: 11/27/2023 CLINICAL DATA:  Fall, left hip fracture. EXAM: PORTABLE CHEST 1 VIEW COMPARISON:  Chest radiograph 04/23/2021 FINDINGS: Patient is rotated. Mild atelectasis at  the left greater than right lung base. No pneumothorax or large pleural effusion. Normal heart size. Stable aortic tortuosity. Kyphoplasty within mid and lower thoracic vertebra. On limited assessment, no acute osseous findings. Left axillary surgical clips. IMPRESSION: Mild atelectasis at the left greater than right lung base. Electronically Signed   By: Andrea Gasman M.D.   On: 11/27/2023 16:57   DG Knee Complete 4 Views Left Result Date: 11/27/2023 CLINICAL DATA:  Pain after fall. Fall at [employer] picking up a watermelon. EXAM: LEFT KNEE - COMPLETE 4+ VIEW COMPARISON:  None Available. FINDINGS: The bones are subjectively under mineralized. No evidence of fracture, dislocation, or joint effusion. The alignment and joint spaces are preserved. No evidence of arthropathy or other focal bone abnormality. Soft tissues are unremarkable. IMPRESSION: 1. No fracture or subluxation of the left knee. 2. Subjective osteopenia/osteoporosis. Electronically Signed   By: Andrea Gasman M.D.   On: 11/27/2023 16:56   DG Hip Unilat W or Wo Pelvis 2-3 Views Left Result Date: 11/27/2023 CLINICAL DATA:  Pain after fall. Fall at [employer] picking up a watermelon. EXAM: DG HIP (WITH OR WITHOUT PELVIS) 2-3V LEFT COMPARISON:  None Available. FINDINGS: Displaced fracture  through the femoral neck. Proximal migration of the femoral shaft with mild angulation. The femoral head remains seated. No additional fracture of the pelvis. Pubic rami are intact. Chain sutures noted in the pelvis. IMPRESSION: Displaced left femoral neck fracture. Electronically Signed   By: Andrea Gasman M.D.   On: 11/27/2023 16:55    Assessment/Plan:   Hip fracture Status post fall resulting in hip fracture, likely due to osteoporosis. Recovery progressing well with minimal pain. - Remove stitches before discharge - Follow-up with surgeon's PA on August 12  Post-operative care Post-operative care includes prevention of blood clots with Lovenox .  Discussion about completing a full 14-day course of Lovenox  post-discharge. She expressed discomfort with self-administration of injections. - Consult with surgeon regarding discontinuation of Lovenox  - Ensure she understands wound care instructions - Send 14 days of medications as a precaution, excluding Boniva and metoprolol   Osteoporosis Contributing factor to hip fracture. Managed with Boniva. - Continue Boniva as prescribed  Patient is being discharged with the following home health services:    Patient is being discharged with the following durable medical equipment:    Patient has been advised to f/u with their PCP in 1-2 weeks to for a transitions of care visit.  Social services at their facility was responsible for arranging this appointment.  Pt was provided with adequate prescriptions of noncontrolled medications to reach the scheduled appointment .  For controlled substances, a limited supply was provided as appropriate for the individual patient.  If the pt normally receives these medications from a pain clinic or has a contract with another physician, these medications should be received from that clinic or physician only).    Future labs/tests needed:  Consider f/u CBC for reevaluation of post operative anemia.

## 2024-01-11 ENCOUNTER — Other Ambulatory Visit: Payer: Medicare Other

## 2024-01-11 ENCOUNTER — Inpatient Hospital Stay: Payer: Medicare Other | Attending: Internal Medicine | Admitting: Internal Medicine

## 2024-01-11 ENCOUNTER — Inpatient Hospital Stay: Payer: Medicare Other

## 2024-01-11 ENCOUNTER — Encounter: Payer: Self-pay | Admitting: Internal Medicine

## 2024-01-11 VITALS — BP 141/73 | HR 71 | Temp 97.7°F | Resp 20 | Ht 60.0 in | Wt 128.0 lb

## 2024-01-11 DIAGNOSIS — Z9221 Personal history of antineoplastic chemotherapy: Secondary | ICD-10-CM | POA: Insufficient documentation

## 2024-01-11 DIAGNOSIS — C8581 Other specified types of non-Hodgkin lymphoma, lymph nodes of head, face, and neck: Secondary | ICD-10-CM

## 2024-01-11 DIAGNOSIS — C833A Diffuse large b-cell lymphoma, in remission: Secondary | ICD-10-CM | POA: Insufficient documentation

## 2024-01-11 DIAGNOSIS — E871 Hypo-osmolality and hyponatremia: Secondary | ICD-10-CM | POA: Diagnosis not present

## 2024-01-11 DIAGNOSIS — Z79899 Other long term (current) drug therapy: Secondary | ICD-10-CM | POA: Insufficient documentation

## 2024-01-11 DIAGNOSIS — G629 Polyneuropathy, unspecified: Secondary | ICD-10-CM | POA: Insufficient documentation

## 2024-01-11 LAB — CMP (CANCER CENTER ONLY)
ALT: 14 U/L (ref 0–44)
AST: 23 U/L (ref 15–41)
Albumin: 3.7 g/dL (ref 3.5–5.0)
Alkaline Phosphatase: 118 U/L (ref 38–126)
Anion gap: 9 (ref 5–15)
BUN: 14 mg/dL (ref 8–23)
CO2: 25 mmol/L (ref 22–32)
Calcium: 9 mg/dL (ref 8.9–10.3)
Chloride: 94 mmol/L — ABNORMAL LOW (ref 98–111)
Creatinine: 0.66 mg/dL (ref 0.44–1.00)
GFR, Estimated: >60 mL/min (ref >60)
Glucose, Bld: 85 mg/dL (ref 70–99)
Potassium: 4.1 mmol/L (ref 3.5–5.1)
Sodium: 128 mmol/L — ABNORMAL LOW (ref 135–145)
Total Bilirubin: 0.4 mg/dL (ref 0.0–1.2)
Total Protein: 6.3 g/dL — ABNORMAL LOW (ref 6.5–8.1)

## 2024-01-11 LAB — CBC WITH DIFFERENTIAL (CANCER CENTER ONLY)
Abs Immature Granulocytes: 0.03 K/uL (ref 0.00–0.07)
Basophils Absolute: 0 K/uL (ref 0.0–0.1)
Basophils Relative: 0 %
Eosinophils Absolute: 0.3 K/uL (ref 0.0–0.5)
Eosinophils Relative: 3 %
HCT: 43.8 % (ref 36.0–46.0)
Hemoglobin: 14.2 g/dL (ref 12.0–15.0)
Immature Granulocytes: 0 %
Lymphocytes Relative: 29 %
Lymphs Abs: 2.1 K/uL (ref 0.7–4.0)
MCH: 27.8 pg (ref 26.0–34.0)
MCHC: 32.4 g/dL (ref 30.0–36.0)
MCV: 85.9 fL (ref 80.0–100.0)
Monocytes Absolute: 0.8 K/uL (ref 0.1–1.0)
Monocytes Relative: 11 %
Neutro Abs: 4.1 K/uL (ref 1.7–7.7)
Neutrophils Relative %: 57 %
Platelet Count: 335 K/uL (ref 150–400)
RBC: 5.1 MIL/uL (ref 3.87–5.11)
RDW: 13.4 % (ref 11.5–15.5)
WBC Count: 7.3 K/uL (ref 4.0–10.5)
nRBC: 0 % (ref 0.0–0.2)

## 2024-01-11 LAB — LACTATE DEHYDROGENASE: LDH: 132 U/L (ref 98–192)

## 2024-01-11 NOTE — Assessment & Plan Note (Signed)
#  Diffuse large B-cell lymphoma-stage III.  PET June 2022-bulky right axillary/subpectoral; bulky retroperitoneal adenopathy; s/p 5 cycles of R-CHOP; discontinued cycle #6 [complicated sigmoid diverticulitis]-FEB 3rd, 2023- PET scan February 4,2022-no residual/recurrent lymphoma.   # No clinical evidence of recurrence. Continue clinical surveillance at this time.  We will plan imaging based on symptoms; stable.   #back pain/ Compression fractures -PET scan February 2023 incidental  multiple thoracolumbar compression fractures progressive T7-T8- ? Osteoporotic;  s/p-kyphoplasty of thoracic vertebra x2 [IR]-pain improved not resolved. BMD- April 2023- On Boniva [PCP;]S/p PT - stable.    # May 2022-Pancreatic head/uncinate cyst [incidental]- MRI NOV 2023- Stable to slight decreased size of the cystic pancreatic lesions both of which appear to demonstrate main ductal communication without suspicious MRI features. Compatible with side branch IPMNs. FEB 2025-  pre and post contrast MRI/MRCP-stable cyst noted in the pancreas.  Recommended repeat scan again in 2 years February 2027.   # Hyponatremia sodium- chronic- over all stable.   #Peripheral neuropathy grade 1-secondary vincristine .  Monitor for now-stable.   # Port/IV access s/p PORT explantation-   * [phone] [home]  # DISPOSITION: # follow up in 12  months- MD; labs- cbc/cmp;LDH-- Dr.B

## 2024-01-11 NOTE — Progress Notes (Signed)
 Fell November 28, 2023, broke left hip, picking up a watermelon and got off balance. Seeing Dr. Edie LAMY.

## 2024-01-11 NOTE — Progress Notes (Signed)
 [facility] CONSULT NOTE  Patient Care Team: Lenon Layman ORN, MD as PCP - General (Internal Medicine) Rennie Cindy SAUNDERS, MD as Consulting Physician (Oncology)  CHIEF COMPLAINTS/PURPOSE OF CONSULTATION: Lymphoma Oncology History Overview Note   # Incidental- MAY 2022-CT scan chest abdomen pelvis consistent with bulky retroperitoneal adenopathy [6-8 cm]; subpectoral/axillary lymphadenopathy 2 to 2.5 cm; PET June 2022-bulky right axillary/subpectoral; bulky retroperitoneal adenopathy.  S/p right axilla lymph node biopsy--large B-cell lymphoma; FISH panel QNS; repeat biopsy  # June 29th, 2022-rituximab  only infusion; November 2022-cycle #5 R-CHOP chemotherapy-discontinued chemotherapy [complicated diverticulitis]; FEB 2023-PET scan NED  #Kyphoplasty-T7 &T8- [IR]-compression fractures   # May 2022-Pancreatic head/uncinate cyst -hypodense 1.3 x 1.0 x 0.9 cm lesion-MRI suggestive of IMPN.  Repeat MRI 3-6 m   # coloscopy [2022; [facility]]; mammogram- nov 2021-WNL.   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 .  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  Walking independently.   Mary Washington 78 y.o.   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   Patient had a mechanical fall in June- broke left hip.  S/p surgery.  Currently back home from rehab. She is walking with a walker  No worsening shortness of breath or cough. Appetite is good.  .  Denies any new lumps or bumps.  Review of Systems  Constitutional:  Positive for malaise/fatigue. Negative for chills, diaphoresis and fever.  HENT:  Negative for nosebleeds and sore throat.   Eyes:  Negative for double vision.  Respiratory:  Negative for cough, hemoptysis, sputum production, shortness of breath and wheezing.   Cardiovascular:  Negative for chest pain, palpitations, orthopnea and leg swelling.  Gastrointestinal:  Negative for abdominal pain, blood in stool, diarrhea, heartburn, melena, nausea and vomiting.  Genitourinary:  Negative for dysuria, frequency and urgency.  Musculoskeletal:  Positive for joint pain.  Skin: Negative.  Negative for itching and rash.  Neurological:  Negative for dizziness, tingling, focal weakness, weakness and headaches.  Endo/Heme/Allergies:  Does not bruise/bleed easily.  Psychiatric/Behavioral:  Negative for depression. The patient is not nervous/anxious and does not have insomnia.      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)    Melanoma (HCC) 1988   lt upper arm, some lymph nodes removed   Mild intermittent asthma    well controlled    Osteoporosis    Pneumonia 2022   Tachycardia     SURGICAL HISTORY: Past Surgical History:  Procedure Laterality Date   BACK SURGERY     CATARACT EXTRACTION     COLONOSCOPY WITH PROPOFOL  N/A 09/12/2021   Procedure: COLONOSCOPY WITH PROPOFOL ;  Surgeon: Tye Millet, DO;  Location: [facility] ENDOSCOPY;  Service: General;  Laterality: N/A;  Dr Tye can be 12:30 PM  procedure start time   HIP ARTHROPLASTY Left 11/28/2023   Procedure: HEMIARTHROPLASTY (BIPOLAR) HIP, POSTERIOR APPROACH FOR FRACTURE;  Surgeon: Edie Norleen PARAS, MD;  Location: [facility] ORS;  Service: Orthopedics;  Laterality: Left;   IR IMAGING GUIDED PORT INSERTION  11/23/2020   IR KYPHO THORACIC WITH BONE BIOPSY  08/12/2021   IR RADIOLOGIST EVAL & MGMT  07/23/2021   IR RADIOLOGIST EVAL & MGMT  09/11/2021   IR REMOVAL TUN ACCESS W/ PORT W/O FL MOD SED  07/31/2023   LAPAROSCOPIC LYSIS OF ADHESIONS  10/21/2021   Procedure: LAPAROSCOPIC LYSIS OF ADHESIONS;  Surgeon: Rodolph Romano, MD;  Location: [facility] ORS;  Service: General;;   PARTIAL COLECTOMY N/A 04/24/2021   Procedure: PARTIAL COLECTOMY;  Surgeon: Rodolph Romano, MD;  Location: [facility] ORS;  Service: General;  Laterality: N/A;   TONSILLECTOMY     VAGINAL HYSTERECTOMY  1990   XI ROBOTIC ASSISTED COLOSTOMY TAKEDOWN N/A 10/21/2021   Procedure: XI ROBOTIC ASSISTED COLOSTOMY TAKEDOWN;  Surgeon: Rodolph Romano, MD;  Location: [facility] ORS;  Service: General;  Laterality: N/A;    SOCIAL HISTORY: Social History   Socioeconomic History   Marital status: Married    Spouse name: Octaviano   Number of children: 2   Years of education: Not on file   Highest education level: Not on file  Occupational History   Not on file  Tobacco Use   Smoking status: Never   Smokeless tobacco: Never  Vaping Use   Vaping status: Never Used  Substance and Sexual Activity   Alcohol use: Yes    Comment: rare   Drug use: Never   Sexual activity: Yes    Partners: Male  Other Topics Concern   Not on  file  Social History Narrative   ** Merged History Encounter **       Lives at home with husband.    Social Drivers of Corporate investment banker Strain: Low Risk  (04/21/2023)   Received from [facility]   Overall Financial Resource Strain (CARDIA)    Difficulty of Paying Living Expenses: Not hard at all  Food Insecurity: No Food Insecurity (11/28/2023)   Hunger Vital Sign    Worried About Running Out of Food in the Last Year: Never true    Ran Out of Food in the Last Year: Never true  Transportation Needs: No Transportation Needs (11/28/2023)   PRAPARE - Administrator, Civil Service (Medical): No    Lack of Transportation (Non-Medical): No  Physical Activity: Not on file  Stress: Not on file  Social Connections: Patient Declined (11/28/2023)   Social Connection and Isolation Panel    Frequency of Communication with Friends and Family: Patient declined    Frequency of Social Gatherings with Friends and Family: Patient declined  Attends Religious Services: Patient declined    Active Member of Clubs or Organizations: Patient declined    Attends Banker Meetings: Patient declined    Marital Status: Patient declined  Intimate Partner Violence: Not At Risk (11/28/2023)   Humiliation, Afraid, Rape, and Kick questionnaire    Fear of Current or Ex-Partner: No    Emotionally Abused: No    Physically Abused: No    Sexually Abused: No    FAMILY HISTORY: Family History  Problem Relation Age of Onset   Diabetes Mother    Breast cancer Neg Hx     ALLERGIES:  has no known allergies.  MEDICATIONS:  Current Outpatient Medications  Medication Sig Dispense Refill   acetaminophen  (TYLENOL ) 325 MG tablet Take 2 tablets (650 mg total) by mouth every 6 (six) hours as needed for mild pain (pain score 1-3), fever or headache.     Calcium  Carb-Cholecalciferol  600-20 MG-MCG TABS Take 1 tablet by mouth daily. citracal     Cholecalciferol  (VITAMIN D )  50 MCG (2000 UT) CAPS Take 2,000 Units by mouth daily.     diclofenac  Sodium (VOLTAREN ) 1 % GEL Apply 2 g topically 2 (two) times daily as needed (left leg pain).     ibandronate (BONIVA) 150 MG tablet Take 150 mg by mouth every 30 (thirty) days. Take in the morning with a full glass of water , on an empty stomach, and do not take anything else by mouth or lie down for the next 30 min.     Magnesium  Oxide (MAG-OXIDE PO) Take 250 mg by mouth daily at 12 noon.     metoprolol  tartrate (LOPRESSOR ) 25 MG tablet Take 1 tablet (25 mg total) by mouth 2 (two) times daily. Use 12.5 mg p.o. twice daily if blood pressure remains soft our prescription that was if blood pressure is low. Hold if SBP <110 mmHg and or HR <65     Multiple Vitamins-Minerals (WOMENS MULTIVITAMIN PO) Take 1 Tera Vector Genomes by mouth daily.     polyethylene glycol (MIRALAX  / GLYCOLAX ) 17 g packet Take 17 g by mouth daily.     No current facility-administered medications for this visit.   Facility-Administered Medications Ordered in Other Visits  Medication Dose Route Frequency Provider Last Rate Last Admin   sodium chloride  flush (NS) 0.9 % injection 10 mL  10 mL Intravenous PRN Yamilex Borgwardt R, MD   10 mL at 01/07/21 0919      .  PHYSICAL EXAMINATION: ECOG PERFORMANCE STATUS: 1 - Symptomatic but completely ambulatory  Vitals:   01/11/24 1036 01/11/24 1047  BP: (!) 145/68 (!) 141/73  Pulse: 71   Resp: 20   Temp: 97.7 F (36.5 C)   SpO2: 99%      Filed Weights   01/11/24 1036  Weight: 128 lb (58.1 kg)       Physical Exam HENT:     Head: Normocephalic and atraumatic.     Mouth/Throat:     Pharynx: No oropharyngeal exudate.  Eyes:     Pupils: Pupils are equal, round, and reactive to light.  Cardiovascular:     Rate and Rhythm: Normal rate and regular rhythm.  Pulmonary:     Effort: Pulmonary effort is normal. No respiratory distress.     Breath sounds: Normal breath sounds. No wheezing.   Abdominal:     General: Bowel sounds are normal. There is no distension.     Palpations: Abdomen is soft. There is no mass.     Tenderness:  There is no abdominal tenderness. There is no guarding or rebound.  Musculoskeletal:        General: Normal range of motion.     Cervical back: Normal range of motion and neck supple.  Skin:    General: Skin is warm.  Neurological:     Mental Status: She is alert and oriented to person, place, and time.  Psychiatric:        Mood and Affect: Affect normal.      LABORATORY DATA:  I have reviewed the data as listed Lab Results  Component Value Date   WBC 7.3 01/11/2024   HGB 14.2 01/11/2024   HCT 43.8 01/11/2024   MCV 85.9 01/11/2024   PLT 335 01/11/2024   Recent Labs    07/13/23 1027 11/27/23 1559 11/27/23 1929 12/01/23 0448 12/02/23 0527 01/11/24 1029  NA 131* 138   < > 132* 126* 128*  K 4.0 2.6*   < > 3.9 3.9 4.1  CL 97* 112*   < > 98 94* 94*  CO2 25 21*   < > 23 23 25   GLUCOSE 111* 88   < > 115* 109* 85  BUN 18 16   < > 14 13 14   CREATININE 0.65 0.62   < > 0.47 0.51 0.66  CALCIUM  8.7* 6.3*   < > 9.3 8.5* 9.0  GFRNONAA >60 >60   < > >60 >60 >60  PROT 6.1* 3.9*  --   --   --  6.3*  ALBUMIN  3.8 2.5*  --   --   --  3.7  AST 21 21  --   --   --  23  ALT 14 15  --   --   --  14  ALKPHOS 67 38  --   --   --  118  BILITOT 0.6 0.5  --   --   --  0.4   < > = values in this interval not displayed.    RADIOGRAPHIC STUDIES: I have personally reviewed the radiological images as listed and agreed with the findings in the report.   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  PET June 2022-bulky right axillary/subpectoral; bulky retroperitoneal adenopathy; s/p 5 cycles of R-CHOP; discontinued cycle #6 [complicated sigmoid diverticulitis]-FEB 3rd, 2023- PET scan February 4,2022-no residual/recurrent lymphoma.   # No clinical evidence of recurrence. Continue  clinical surveillance at this time.  We will plan imaging based on symptoms; stable.   #back pain/ Compression fractures -PET scan February 2023 incidental  multiple thoracolumbar compression fractures progressive T7-T8- ? Osteoporotic;  s/p-kyphoplasty of thoracic vertebra x2 [IR]-pain improved not resolved. BMD- April 2023- On Bon[facility] [PCP;]S/p PT - stable.    # May 2022-Pancreatic head/uncinate cyst [incidental]- MRI NOV 2023- Stable to slight decreased size of the cystic pancreatic lesions both of which appear to demonstrate main ductal communication without suspicious MRI features. Compatible with side branch IPMNs. FEB 2025-  pre and post contrast MRI/MRCP-stable cyst noted in the pancreas.  Recommended repeat scan again in 2 years February 2027.   # Hyponatremia sodium- chronic- over all stable.   #Peripheral neuropathy grade 1-secondary vincristine .  Monitor for now-stable.   # Port/IV access s/p PORT explantation-   * [phone] [home]  # DISPOSITION: # follow up in 12  months- MD; labs- cbc/cmp;LDH-- Dr.B   All questions were answered. The patient knows to call the clinic with any problems, questions or  concerns.    Cindy JONELLE Joe, MD 01/11/2024 11:33 AM

## 2024-02-23 ENCOUNTER — Other Ambulatory Visit: Payer: Self-pay | Admitting: Internal Medicine

## 2024-02-23 DIAGNOSIS — Z1231 Encounter for screening mammogram for malignant neoplasm of breast: Secondary | ICD-10-CM

## 2024-03-17 ENCOUNTER — Ambulatory Visit
Admission: RE | Admit: 2024-03-17 | Discharge: 2024-03-17 | Disposition: A | Discharge status: Home / Self Care | Source: Ambulatory Visit | Attending: Internal Medicine | Admitting: Internal Medicine

## 2024-03-17 DIAGNOSIS — Z1231 Encounter for screening mammogram for malignant neoplasm of breast: Secondary | ICD-10-CM | POA: Diagnosis present

## 2025-01-10 ENCOUNTER — Other Ambulatory Visit

## 2025-01-10 ENCOUNTER — Ambulatory Visit: Admitting: Internal Medicine
